# Patient Record
Sex: Female | Born: 1960
Health system: Southern US, Community
[De-identification: ages and names within clinical notes are randomized; demographics above are authoritative.]

## PROBLEM LIST (undated history)

## (undated) DIAGNOSIS — S92502A Displaced unspecified fracture of left lesser toe(s), initial encounter for closed fracture: Secondary | ICD-10-CM

## (undated) DIAGNOSIS — Z720 Tobacco use: Secondary | ICD-10-CM

## (undated) DIAGNOSIS — L409 Psoriasis, unspecified: Secondary | ICD-10-CM

## (undated) DIAGNOSIS — R7303 Prediabetes: Secondary | ICD-10-CM

## (undated) DIAGNOSIS — H5440 Blindness, one eye, unspecified eye: Secondary | ICD-10-CM

## (undated) DIAGNOSIS — I252 Old myocardial infarction: Secondary | ICD-10-CM

## (undated) DIAGNOSIS — I2510 Atherosclerotic heart disease of native coronary artery without angina pectoris: Secondary | ICD-10-CM

## (undated) DIAGNOSIS — F419 Anxiety disorder, unspecified: Secondary | ICD-10-CM

## (undated) DIAGNOSIS — E785 Hyperlipidemia, unspecified: Secondary | ICD-10-CM

## (undated) DIAGNOSIS — J189 Pneumonia, unspecified organism: Secondary | ICD-10-CM

## (undated) DIAGNOSIS — Q211 Atrial septal defect: Secondary | ICD-10-CM

## (undated) DIAGNOSIS — F329 Major depressive disorder, single episode, unspecified: Secondary | ICD-10-CM

## (undated) DIAGNOSIS — M1990 Unspecified osteoarthritis, unspecified site: Secondary | ICD-10-CM

## (undated) DIAGNOSIS — I63511 Cerebral infarction due to unspecified occlusion or stenosis of right middle cerebral artery: Secondary | ICD-10-CM

## (undated) DIAGNOSIS — G8929 Other chronic pain: Secondary | ICD-10-CM

## (undated) DIAGNOSIS — F1910 Other psychoactive substance abuse, uncomplicated: Secondary | ICD-10-CM

## (undated) DIAGNOSIS — R32 Unspecified urinary incontinence: Secondary | ICD-10-CM

## (undated) DIAGNOSIS — I10 Essential (primary) hypertension: Secondary | ICD-10-CM

## (undated) DIAGNOSIS — S92919A Unspecified fracture of unspecified toe(s), initial encounter for closed fracture: Secondary | ICD-10-CM

## (undated) HISTORY — DX: Prediabetes: R73.03

## (undated) HISTORY — DX: Major depressive disorder, single episode, unspecified: F32.9

## (undated) HISTORY — DX: Cerebral infarction due to unspecified occlusion or stenosis of right middle cerebral artery: I63.511

## (undated) HISTORY — PX: CARDIAC CATHETERIZATION: SHX172

## (undated) HISTORY — DX: Displaced unspecified fracture of left lesser toe(s), initial encounter for closed fracture: S92.502A

## (undated) HISTORY — PX: EYE SURGERY: SHX253

## (undated) HISTORY — DX: Unspecified fracture of unspecified toe(s), initial encounter for closed fracture: S92.919A

## (undated) HISTORY — DX: Atherosclerotic heart disease of native coronary artery without angina pectoris: I25.10

## (undated) HISTORY — DX: Other psychoactive substance abuse, uncomplicated: F19.10

## (undated) HISTORY — DX: Essential (primary) hypertension: I10

## (undated) HISTORY — PX: BIOPSY THYROID: PRO38

## (undated) HISTORY — DX: Unspecified urinary incontinence: R32

## (undated) HISTORY — DX: Hyperlipidemia, unspecified: E78.5

## (undated) HISTORY — DX: Psoriasis, unspecified: L40.9

## (undated) HISTORY — DX: Atrial septal defect: Q21.1

## (undated) HISTORY — PX: OTHER SURGICAL HISTORY: SHX169

## (undated) HISTORY — DX: Unspecified osteoarthritis, unspecified site: M19.90

## (undated) HISTORY — DX: Anxiety disorder, unspecified: F41.9

## (undated) HISTORY — DX: Tobacco use: Z72.0

---

## 1999-09-16 ENCOUNTER — Other Ambulatory Visit: Admission: RE | Admit: 1999-09-16 | Discharge: 1999-09-16 | Payer: Self-pay | Admitting: *Deleted

## 2004-06-20 DIAGNOSIS — I63511 Cerebral infarction due to unspecified occlusion or stenosis of right middle cerebral artery: Secondary | ICD-10-CM

## 2004-06-20 HISTORY — DX: Cerebral infarction due to unspecified occlusion or stenosis of right middle cerebral artery: I63.511

## 2005-06-20 DIAGNOSIS — I2510 Atherosclerotic heart disease of native coronary artery without angina pectoris: Secondary | ICD-10-CM

## 2005-06-20 HISTORY — DX: Atherosclerotic heart disease of native coronary artery without angina pectoris: I25.10

## 2006-06-20 DIAGNOSIS — I252 Old myocardial infarction: Secondary | ICD-10-CM

## 2006-06-20 HISTORY — DX: Old myocardial infarction: I25.2

## 2006-10-15 ENCOUNTER — Inpatient Hospital Stay: Payer: Self-pay | Admitting: Cardiovascular Disease

## 2006-10-15 ENCOUNTER — Other Ambulatory Visit: Payer: Self-pay

## 2006-10-16 ENCOUNTER — Other Ambulatory Visit: Payer: Self-pay

## 2006-10-17 ENCOUNTER — Other Ambulatory Visit: Payer: Self-pay

## 2006-11-25 ENCOUNTER — Emergency Department: Payer: Self-pay | Admitting: Emergency Medicine

## 2006-11-25 ENCOUNTER — Other Ambulatory Visit: Payer: Self-pay

## 2007-11-23 ENCOUNTER — Ambulatory Visit: Payer: Self-pay | Admitting: Family Medicine

## 2007-12-14 ENCOUNTER — Emergency Department (HOSPITAL_COMMUNITY): Admission: EM | Admit: 2007-12-14 | Discharge: 2007-12-14 | Payer: Self-pay | Admitting: Emergency Medicine

## 2007-12-26 ENCOUNTER — Ambulatory Visit: Payer: Self-pay | Admitting: Internal Medicine

## 2008-05-16 ENCOUNTER — Emergency Department (HOSPITAL_COMMUNITY): Admission: EM | Admit: 2008-05-16 | Discharge: 2008-05-16 | Payer: Self-pay | Admitting: Emergency Medicine

## 2008-05-26 ENCOUNTER — Emergency Department (HOSPITAL_COMMUNITY): Admission: EM | Admit: 2008-05-26 | Discharge: 2008-05-26 | Payer: Self-pay | Admitting: Emergency Medicine

## 2008-05-27 ENCOUNTER — Emergency Department (HOSPITAL_COMMUNITY): Admission: EM | Admit: 2008-05-27 | Discharge: 2008-05-27 | Payer: Self-pay | Admitting: Emergency Medicine

## 2010-12-14 ENCOUNTER — Emergency Department (INDEPENDENT_AMBULATORY_CARE_PROVIDER_SITE_OTHER): Payer: Medicaid Other

## 2010-12-14 ENCOUNTER — Emergency Department (HOSPITAL_BASED_OUTPATIENT_CLINIC_OR_DEPARTMENT_OTHER)
Admission: EM | Admit: 2010-12-14 | Discharge: 2010-12-15 | Disposition: A | Discharge status: Home / Self Care | Payer: Medicaid Other | Attending: Emergency Medicine | Admitting: Emergency Medicine

## 2010-12-14 DIAGNOSIS — I252 Old myocardial infarction: Secondary | ICD-10-CM | POA: Insufficient documentation

## 2010-12-14 DIAGNOSIS — E785 Hyperlipidemia, unspecified: Secondary | ICD-10-CM | POA: Insufficient documentation

## 2010-12-14 DIAGNOSIS — R55 Syncope and collapse: Secondary | ICD-10-CM | POA: Insufficient documentation

## 2010-12-14 DIAGNOSIS — F341 Dysthymic disorder: Secondary | ICD-10-CM | POA: Insufficient documentation

## 2010-12-14 DIAGNOSIS — I10 Essential (primary) hypertension: Secondary | ICD-10-CM | POA: Insufficient documentation

## 2010-12-14 DIAGNOSIS — M79609 Pain in unspecified limb: Secondary | ICD-10-CM | POA: Insufficient documentation

## 2010-12-14 DIAGNOSIS — Z79899 Other long term (current) drug therapy: Secondary | ICD-10-CM | POA: Insufficient documentation

## 2010-12-14 LAB — URINALYSIS, ROUTINE W REFLEX MICROSCOPIC
Ketones, ur: NEGATIVE mg/dL
Nitrite: NEGATIVE
Protein, ur: NEGATIVE mg/dL

## 2010-12-14 LAB — URINE MICROSCOPIC-ADD ON

## 2010-12-15 LAB — DIFFERENTIAL
Basophils Absolute: 0 10*3/uL (ref 0.0–0.1)
Eosinophils Relative: 4 % (ref 0–5)
Lymphs Abs: 3.7 10*3/uL (ref 0.7–4.0)
Neutro Abs: 4.6 10*3/uL (ref 1.7–7.7)
Neutrophils Relative %: 50 % (ref 43–77)

## 2010-12-15 LAB — CBC
HCT: 39.3 % (ref 36.0–46.0)
MCH: 30 pg (ref 26.0–34.0)
Platelets: 177 10*3/uL (ref 150–400)
RBC: 4.56 MIL/uL (ref 3.87–5.11)
RDW: 12.8 % (ref 11.5–15.5)
WBC: 9.1 10*3/uL (ref 4.0–10.5)

## 2010-12-15 LAB — RAPID URINE DRUG SCREEN, HOSP PERFORMED
Benzodiazepines: NOT DETECTED
Cocaine: NOT DETECTED
Opiates: NOT DETECTED

## 2010-12-15 LAB — BASIC METABOLIC PANEL
Calcium: 9.4 mg/dL (ref 8.4–10.5)
Chloride: 104 mEq/L (ref 96–112)
GFR calc Af Amer: >60 mL/min (ref >60)
GFR calc non Af Amer: >60 mL/min (ref >60)

## 2010-12-15 LAB — CK TOTAL AND CKMB (NOT AT ARMC)
CK, MB: 3 ng/mL (ref 0.3–4.0)
Total CK: 144 U/L (ref 7–177)

## 2010-12-16 LAB — URINE CULTURE
Colony Count: NO GROWTH
Culture  Setup Time: 201206270550
Culture: NO GROWTH

## 2011-01-19 DIAGNOSIS — Q2112 Patent foramen ovale: Secondary | ICD-10-CM

## 2011-01-19 DIAGNOSIS — S92919A Unspecified fracture of unspecified toe(s), initial encounter for closed fracture: Secondary | ICD-10-CM

## 2011-01-19 DIAGNOSIS — Q211 Atrial septal defect: Secondary | ICD-10-CM

## 2011-01-19 HISTORY — DX: Atrial septal defect: Q21.1

## 2011-01-19 HISTORY — DX: Unspecified fracture of unspecified toe(s), initial encounter for closed fracture: S92.919A

## 2011-01-19 HISTORY — DX: Patent foramen ovale: Q21.12

## 2011-01-27 ENCOUNTER — Emergency Department (HOSPITAL_COMMUNITY): Payer: Medicaid Other

## 2011-01-27 ENCOUNTER — Encounter: Payer: Self-pay | Admitting: Internal Medicine

## 2011-01-27 ENCOUNTER — Inpatient Hospital Stay (HOSPITAL_COMMUNITY): Payer: Medicaid Other

## 2011-01-27 ENCOUNTER — Inpatient Hospital Stay (HOSPITAL_COMMUNITY)
Admission: EM | Admit: 2011-01-27 | Discharge: 2011-02-04 | DRG: 065 | Disposition: A | Discharge status: Skilled Nursing Facility | Payer: Medicaid Other | Attending: Internal Medicine | Admitting: Internal Medicine

## 2011-01-27 DIAGNOSIS — R319 Hematuria, unspecified: Secondary | ICD-10-CM | POA: Diagnosis not present

## 2011-01-27 DIAGNOSIS — E785 Hyperlipidemia, unspecified: Secondary | ICD-10-CM | POA: Diagnosis present

## 2011-01-27 DIAGNOSIS — I253 Aneurysm of heart: Secondary | ICD-10-CM | POA: Diagnosis present

## 2011-01-27 DIAGNOSIS — K5900 Constipation, unspecified: Secondary | ICD-10-CM | POA: Diagnosis not present

## 2011-01-27 DIAGNOSIS — Y92009 Unspecified place in unspecified non-institutional (private) residence as the place of occurrence of the external cause: Secondary | ICD-10-CM

## 2011-01-27 DIAGNOSIS — I10 Essential (primary) hypertension: Secondary | ICD-10-CM | POA: Diagnosis present

## 2011-01-27 DIAGNOSIS — I6350 Cerebral infarction due to unspecified occlusion or stenosis of unspecified cerebral artery: Secondary | ICD-10-CM

## 2011-01-27 DIAGNOSIS — F3289 Other specified depressive episodes: Secondary | ICD-10-CM | POA: Diagnosis present

## 2011-01-27 DIAGNOSIS — D649 Anemia, unspecified: Secondary | ICD-10-CM | POA: Diagnosis present

## 2011-01-27 DIAGNOSIS — E876 Hypokalemia: Secondary | ICD-10-CM | POA: Diagnosis present

## 2011-01-27 DIAGNOSIS — F329 Major depressive disorder, single episode, unspecified: Secondary | ICD-10-CM | POA: Diagnosis present

## 2011-01-27 DIAGNOSIS — Z8673 Personal history of transient ischemic attack (TIA), and cerebral infarction without residual deficits: Secondary | ICD-10-CM | POA: Insufficient documentation

## 2011-01-27 DIAGNOSIS — W1789XA Other fall from one level to another, initial encounter: Secondary | ICD-10-CM | POA: Diagnosis present

## 2011-01-27 DIAGNOSIS — R7309 Other abnormal glucose: Secondary | ICD-10-CM | POA: Diagnosis present

## 2011-01-27 DIAGNOSIS — S92919A Unspecified fracture of unspecified toe(s), initial encounter for closed fracture: Secondary | ICD-10-CM | POA: Diagnosis present

## 2011-01-27 DIAGNOSIS — I059 Rheumatic mitral valve disease, unspecified: Secondary | ICD-10-CM | POA: Diagnosis present

## 2011-01-27 DIAGNOSIS — Y998 Other external cause status: Secondary | ICD-10-CM

## 2011-01-27 DIAGNOSIS — I252 Old myocardial infarction: Secondary | ICD-10-CM

## 2011-01-27 DIAGNOSIS — R29810 Facial weakness: Secondary | ICD-10-CM | POA: Diagnosis present

## 2011-01-27 DIAGNOSIS — F411 Generalized anxiety disorder: Secondary | ICD-10-CM | POA: Diagnosis present

## 2011-01-27 DIAGNOSIS — Z7982 Long term (current) use of aspirin: Secondary | ICD-10-CM

## 2011-01-27 DIAGNOSIS — L408 Other psoriasis: Secondary | ICD-10-CM | POA: Diagnosis present

## 2011-01-27 DIAGNOSIS — Y93E1 Activity, personal bathing and showering: Secondary | ICD-10-CM

## 2011-01-27 DIAGNOSIS — Q211 Atrial septal defect: Secondary | ICD-10-CM

## 2011-01-27 DIAGNOSIS — Q2111 Secundum atrial septal defect: Secondary | ICD-10-CM

## 2011-01-27 DIAGNOSIS — F17200 Nicotine dependence, unspecified, uncomplicated: Secondary | ICD-10-CM | POA: Diagnosis present

## 2011-01-27 LAB — DIFFERENTIAL
Basophils Absolute: 0 10*3/uL (ref 0.0–0.1)
Lymphocytes Relative: 20 % (ref 12–46)
Lymphs Abs: 1.8 10*3/uL (ref 0.7–4.0)
Monocytes Absolute: 0.5 10*3/uL (ref 0.1–1.0)
Monocytes Relative: 5 % (ref 3–12)
Neutro Abs: 6.5 10*3/uL (ref 1.7–7.7)
Neutrophils Relative %: 74 % (ref 43–77)

## 2011-01-27 LAB — BASIC METABOLIC PANEL
BUN: 12 mg/dL (ref 6–23)
CO2: 27 mEq/L (ref 19–32)
CO2: 26 mEq/L (ref 19–32)
Calcium: 9 mg/dL (ref 8.4–10.5)
Calcium: 8.9 mg/dL (ref 8.4–10.5)
Creatinine, Ser: 0.57 mg/dL (ref 0.50–1.10)
Creatinine, Ser: 0.51 mg/dL (ref 0.50–1.10)
GFR calc Af Amer: >60 mL/min (ref >60)
GFR calc non Af Amer: >60 mL/min (ref >60)
Sodium: 141 mEq/L (ref 135–145)

## 2011-01-27 LAB — COMPREHENSIVE METABOLIC PANEL
ALT: 21 U/L (ref 0–35)
AST: 28 U/L (ref 0–37)
Albumin: 3.5 g/dL (ref 3.5–5.2)
Alkaline Phosphatase: 78 U/L (ref 39–117)
Chloride: 105 mEq/L (ref 96–112)
Potassium: 2.5 mEq/L — CL (ref 3.5–5.1)
Sodium: 143 mEq/L (ref 135–145)
Total Bilirubin: 0.5 mg/dL (ref 0.3–1.2)
Total Protein: 6.4 g/dL (ref 6.0–8.3)

## 2011-01-27 LAB — PROTIME-INR
INR: 1.03 (ref 0.00–1.49)
Prothrombin Time: 13.7 seconds (ref 11.6–15.2)

## 2011-01-27 LAB — CBC
HCT: 31.7 % — ABNORMAL LOW (ref 36.0–46.0)
MCH: 30.5 pg (ref 26.0–34.0)
MCV: 86.4 fL (ref 78.0–100.0)
WBC: 8.9 10*3/uL (ref 4.0–10.5)

## 2011-01-27 LAB — HEMOGLOBIN A1C
Hgb A1c MFr Bld: 6.4 % — ABNORMAL HIGH (ref <5.7)
Mean Plasma Glucose: 137 mg/dL — ABNORMAL HIGH (ref <117)

## 2011-01-27 LAB — LIPID PANEL
Cholesterol: 166 mg/dL (ref 0–200)
LDL Cholesterol: 86 mg/dL (ref 0–99)
Total CHOL/HDL Ratio: 6.1 RATIO
VLDL: 53 mg/dL — ABNORMAL HIGH (ref 0–40)

## 2011-01-27 NOTE — H&P (Signed)
Hospital Admission Note Date: 01/27/2011  Patient name:  Sandra [name]  Medical record number:  [id number] Date of birth:  1960/12/20  Age: 50 y.o. Gender:  female PCP:    No primary provider on file.  Medical Service:   Internal Medicine Teaching Service   Attending physician:  Dr. Meredith Pel First Contact:   Dr. Berlinda Last  Pager: [phone] Second Contact:   Dr. Gilford Rile   Pager: [phone] After Hours:    First Contact   Pager: [phone]      Second Contact  Pager: [phone]   Chief Complaint: left sided numbness  History of Present Illness: Patient is a 50 y.o. female with a PMHx of hypertension, coronary artery disease, hyperlipidemia, current smoker and family history of premature vascular disease comes to the emergency room with chief complaint of left-sided numbness and fall. Patient complained that she has been having numbness in her left arm and left lower leg since last 3 weeks, the numbness is described as persistent and non-progressive. Patient was seen by her primary care physician about 4 days ago and was prescribed Mobic, tramadol and Norco for pain control. She was also advised bedrest. She said that it was thought to be secondary to nerve impingement. He also had pain in her right shoulder joint at that time. The patient was able to do her activities of daily living without much difficulty until 2 days ago when her balance progressively got worse and she had a fall while taking shower on night prior to admission. Patient also complained of difficulty in swallowing since last 2 days. Patient denies any chest pain, shortness of breath, difficulty breathing, orthopnea, PND or swelling in her extremities. There is no recent changes in the medications. Patient denies any palpitations recently. She denies any changes in bowel or bladder habits.    Current Outpatient Medications: Klonopin 1 mg by mouth twice a day Lisinopril 40 mg by mouth daily Metoprolol 25 mg twice a day Pravastatin 40 mg  daily Norco, Mobic, and tramadol-dose unknown  Allergies: Azor makes her blood pressure fall acutely.  Past Medical History: Hypertension Anxiety and depression Hyperlipidemia Myocardial infarction in 2007 with stent placement by Dr. Park Breed at [facility] in [place] Endometriosis History of a heart murmur Psoriasis  Past Surgical History: Dilation and curettage-remote  Family History: mom died due to heart attack at age 26 Dad died of a stroke at 72 brother has left-sided hemiplegia from a stroke which he had at age 33 and is wheelchair-bound  Social History: Divorced, has one son and works at [employer]. Patient has no insurance. Patient smokes about one pack per day and has a 40-pack-year smoking history. Denies alcohol or illicit drug use.  Review of Systems: Pertinent items are noted in HPI.  Vital Signs: T:  98.2 P:  91  BP:  154/78  RR:  20  O2 sat:  97% on room air    Physical Exam: General: Vital signs reviewed and noted. Well-developed, well-nourished, in no acute distress; alert, appropriate and cooperative throughout examination.  Head: Normocephalic, atraumatic.  Eyes: PERRL, EOMI, No signs of anemia or jaundince.  Ears: TM nonerythematous, not bulging, good light reflex bilaterally.  Nose: Mucous membranes moist, not inflammed, nonerythematous.  Throat: Oropharynx nonerythematous, no exudate appreciated.   Neck: No deformities, masses, or tenderness noted.Supple, No carotid Bruits, no JVD.  Lungs:  Normal respiratory effort. Clear to auscultation BL without crackles or wheezes.  Heart: RRR. S1 and S2 normal without gallop, or rubs.  Patient has 3/6 systolic murmur best heard at left sternal border   Abdomen:  BS normoactive. Soft, Nondistended, non-tender.  No masses or organomegaly.  Extremities: No pretibial edema.  Neurologic: A&O X3, CN II - XII are grossly intact. Motor strength is 5/5 in the right upper and lower extremities, motor 2/5 left upper  extremity and 4/5 in left lower extremity, Sensations intact to light touch, Cerebellar signs negative. Diminished reflexes in the left upper extremity.Babinski's downgoing bilaterally   Skin:  psoariatic rashes right elbow noted.   Lab results: CBC:    Component Value Date/Time   WBC 8.9 01/27/2011 0625   HGB 11.2* 01/27/2011 0625   HCT 31.7* 01/27/2011 0625   PLT 152 01/27/2011 0625   MCV 86.4 01/27/2011 0625   NEUTROABS 6.5 01/27/2011 0625   LYMPHSABS 1.8 01/27/2011 0625   MONOABS 0.5 01/27/2011 0625   EOSABS 0.1 01/27/2011 0625   BASOSABS 0.0 01/27/2011 0625      Comprehensive Metabolic Panel:    Component Value Date/Time   NA 142 01/27/2011 0625   K 2.5* 01/27/2011 0625   CL 104 01/27/2011 0625   CO2 27 01/27/2011 0625   BUN 12 01/27/2011 0625   CREATININE 0.57 01/27/2011 0625   GLUCOSE 117* 01/27/2011 0625   CALCIUM 9.0 01/27/2011 0625     Lab Results  Component Value Date   CKTOTAL 144 12/14/2010   CKMB 3.0 12/14/2010   TROPONINI <0.30 12/14/2010      Imaging results:  CT head: IMPRESSION:   1.  Interval right insular/superior parietal low density favors an   acute ischemic infarction.  No associated acute hemorrhage or   significant mass effect.   2.  Interval clearing paranasal sinuses since 05/27/2008.   3.  Otherwise, negative.   Assessment & Plan:  #1 acute right-sided nonhemorrhagic stroke: Neurology was consulted by the ED physicians who had asked for internal medicine admission. Patient is out of the acute window for thrombolytics. We will work on risk stratification for secondary prevention. Smoking cessation, lipid control, HbA1c, better control of blood pressure and changing aspirin to Aggrenox. I would also obtain carotid Dopplers and 2-D echo to rule out atherosclerotic and embolic stroke respectively. I admitted the patient to telemetry unit and asked to do Q4H neuro checks for acute progression. Start Crestor for plaque stabilization. Patient had already passed the bedside  swallow screen by the time I saw her and I will put her on heart healthy diet. MRI/MRA ordered as per ED physician on the instructions of neurologist. #2 hypokalemia: Potassium was repleted in the emergency department along with magnesium. Repeat potassium check tomorrow morning. #3 coronary artery disease: Patient has had history of MI in 2007. No active chest pain or palpitations at this time. We'll continue beta blockers and antiplatelet therapy with Aggrenox.  #4 hypertension: Slightly hypertensive at this time and I would restart home blood pressure medications ie lisinopril and beta blocker. #5 anxiety and depression: Continue home medication Klonopin at this time. #6 smoking: Social worker consult for smoking cessation counseling. Patient is very motivated to stop smoking at this time.    DVT PPX: Lovenox 40 mg subcutaneous     (PGY1):  ____________________________________    Date/ Time:      ____________________________________     Lars Mage, M.D. (Senior resident):    ____________________________________    Date/ Time:      ____________________________________     I have seen and examined the patient. I reviewed the resident/fellow note  and agree with the findings and plan of care as documented. My additions and revisions are included.   Signature:  ____________________________________________     Internal Medicine Teaching Service Attending    Date:    ____________________________________________

## 2011-01-28 ENCOUNTER — Inpatient Hospital Stay (HOSPITAL_COMMUNITY): Payer: Medicaid Other

## 2011-01-28 DIAGNOSIS — S92502A Displaced unspecified fracture of left lesser toe(s), initial encounter for closed fracture: Secondary | ICD-10-CM

## 2011-01-28 HISTORY — DX: Displaced unspecified fracture of left lesser toe(s), initial encounter for closed fracture: S92.502A

## 2011-01-28 LAB — URINALYSIS, ROUTINE W REFLEX MICROSCOPIC
Glucose, UA: NEGATIVE mg/dL
Ketones, ur: 15 mg/dL — AB
Protein, ur: NEGATIVE mg/dL
Urobilinogen, UA: 0.2 mg/dL (ref 0.0–1.0)

## 2011-01-28 LAB — BASIC METABOLIC PANEL
BUN: 7 mg/dL (ref 6–23)
Chloride: 110 mEq/L (ref 96–112)
Glucose, Bld: 112 mg/dL — ABNORMAL HIGH (ref 70–99)
Potassium: 3.5 mEq/L (ref 3.5–5.1)

## 2011-01-28 LAB — IRON AND TIBC
Iron: 38 ug/dL — ABNORMAL LOW (ref 42–135)
Saturation Ratios: 13 % — ABNORMAL LOW (ref 20–55)
TIBC: 287 ug/dL (ref 250–470)
UIBC: 249 ug/dL

## 2011-01-28 LAB — CBC
HCT: 31.8 % — ABNORMAL LOW (ref 36.0–46.0)
Hemoglobin: 11 g/dL — ABNORMAL LOW (ref 12.0–15.0)
WBC: 6.6 10*3/uL (ref 4.0–10.5)

## 2011-01-28 LAB — DIFFERENTIAL
Basophils Absolute: 0 10*3/uL (ref 0.0–0.1)
Lymphocytes Relative: 35 % (ref 12–46)
Lymphs Abs: 2.3 10*3/uL (ref 0.7–4.0)
Monocytes Absolute: 0.4 10*3/uL (ref 0.1–1.0)
Neutro Abs: 3.7 10*3/uL (ref 1.7–7.7)

## 2011-01-28 LAB — URINE MICROSCOPIC-ADD ON

## 2011-01-28 LAB — SEDIMENTATION RATE: Sed Rate: 26 mm/hr — ABNORMAL HIGH (ref 0–22)

## 2011-01-28 NOTE — Consult Note (Signed)
NAMEJALENA, Sandra [name]                 ACCOUNT NO.:  [id number]  MEDICAL RECORD NO.:  [id number]  LOCATION:  3004                         FACILITY:  [facility]  PHYSICIAN:  Levie Heritage, MD       DATE OF BIRTH:  03-13-1961  DATE OF CONSULTATION:  01/27/2011 DATE OF DISCHARGE:                                CONSULTATION   REFERRING PHYSICIAN:  ER Team.  REASON FOR CONSULTATION:  Stroke.  CHIEF COMPLAINT:  Left-sided weakness.  HISTORY OF PRESENT ILLNESS:  This patient is a 49 year old woman who has been complaining of left-sided weakness mainly involving the left arm and left side of the face more than the left leg for almost 3 weeks now. As per the patient, she has been still able to move the arm and the leg to some extent in the last 3 weeks, but yesterday she noted that it got more weaker as compared to her weakness for 3 weeks.  She also noted that this tingling and numbness of that side as well which lasted transiently, but the weakness has persisted.  She denies any changes in her vision.  She states that she is still able to walk, but kind of drag her left leg.  She came to the emergency department this morning and had a CT scan of the head which is showing low-density area in the right MCA distribution.  PAST MEDICAL HISTORY:  Hypertension, psoriasis, endometriosis, coronary artery disease status post myocardial infarction in 2008 with status post stenting, hyperlipidemia.  She had one-time miscarriage as well of her second pregnancy.  CURRENT LIST OF MEDICATION:  She does not know the dosages, but she takes pravastatin, lisinopril, metoprolol, Klonopin, and aspirin 325 mg daily.  SOCIAL HISTORY:  She works at [employer].  She is divorced.  She has a son. She smokes almost a pack of cigarettes a day since teenage.  Denies use of alcohol or any illicit drugs.  FAMILY HISTORY:  Mother died of heart attack.  Father died of stroke. The patient's brother also had  stroke.  ALLERGIES:  She is allergic to AZOR.  REVIEW OF SYSTEMS:  Denies any chest pain, denies any shortness of breath, denies any problem __________.  Denies any nausea, vomiting, diarrhea.  No issues with the bowel or bladder control function.  No recent fevers.  No recent rashes.  No recent weight loss.  No joint problems.  No jaundice.  No dark stools.  Rest of 10-organ review of systems unremarkable.  REVIEW OF CLINICAL DATA:  I have seen her CT scan of the head and have noted the low-density area of subacute severity in the right MCA distribution. I have reviewed her labs and noted mild anemia on the CBC with low potassium on the BMP and increased glucose values, normal creatinine. Rest of the labs are pending.  PHYSICAL EXAMINATION:  GENERAL/VITAL SIGNS:  Currently, lying on bed comfortably with vital, 155/78 mmHg and pulse of 88 per minute.  Her NIH stroke scale is 1 for the left face weakness, 2 for the left arm and 1 for the left leg weakness making total of 5.  She is awake and oriented x3 in no  acute distress.  Bilateral pupils reactive to light and accommodation.  Moves eyes to all direction.  There is no field cut noted on limited bedside evaluation.  The face is obviously asymmetrical with weakness of the left lower face of upper motor neuron type. Sensations are preserved bilaterally however.  Tongue is midline without atrophy or fasciculation.  Palate elevates symmetrically bilaterally. MUSCULOSKELETAL:  Motor examination reveals that although she can raise the left arm with the proximal shoulder elevation, however, the arm is weak mostly in 3-4/5 ranges and the left leg is also 4/5 ranges weakness.  The right side is intact strength wise.  Sensory examination reveals mostly intact to light touch all over symmetrically. The gait was deferred.  IMPRESSION:  A 50 year old woman with 3 weeks history of left-sided weakness mainly involving the left arm and face with  some sensory involvement as well.  Left leg is also weaker than right normal side. As the CT scan of the head shows low-density area, her symptoms do correlate with the right middle cerebral artery infarction that could have been the result of terminal branch occlusions of the middle cerebral artery versus artery to artery emboli within the right middle cerebral artery.  PLAN: 1. I have suggested the primary team to get the MRI and MRA of the     patient's brain. 2. Please start her on Plavix 75 mg daily as she has been on aspirin     already and given the cardiac stent, likley she should be on both. 3. In addition to the fasting lipid panel and HbA1c checks, she also     needs to have the hypercoagulable workup given the stroke in early     age, multiple family members with strokes, as well as history of miscarriage. 4. Please get her cardiac echo and Doppler carotid as well. 5. TPA was not offered to this patient as the symptoms are going on     for at least 3 weeks now and she is out of the TPA window period. 6. Stroke team will follow up the patient from morning and we will     proceed according to the case progresses.    ______________________________ Levie Heritage, MD     WS/MEDQ  D:  01/27/2011  T:  01/27/2011  Job:  [id number]  Electronically Signed by Levie Heritage MD on 01/28/2011 07:50:54 AM

## 2011-01-29 DIAGNOSIS — I6350 Cerebral infarction due to unspecified occlusion or stenosis of unspecified cerebral artery: Secondary | ICD-10-CM

## 2011-01-29 LAB — BASIC METABOLIC PANEL
BUN: 9 mg/dL (ref 6–23)
CO2: 28 mEq/L (ref 19–32)
Calcium: 8.9 mg/dL (ref 8.4–10.5)
Creatinine, Ser: 0.56 mg/dL (ref 0.50–1.10)
Glucose, Bld: 127 mg/dL — ABNORMAL HIGH (ref 70–99)

## 2011-01-30 LAB — BASIC METABOLIC PANEL
BUN: 11 mg/dL (ref 6–23)
CO2: 25 mEq/L (ref 19–32)
Calcium: 9 mg/dL (ref 8.4–10.5)
Creatinine, Ser: 0.52 mg/dL (ref 0.50–1.10)
GFR calc non Af Amer: >60 mL/min (ref >60)
Glucose, Bld: 108 mg/dL — ABNORMAL HIGH (ref 70–99)

## 2011-01-30 LAB — GLUCOSE, CAPILLARY

## 2011-01-31 DIAGNOSIS — I6350 Cerebral infarction due to unspecified occlusion or stenosis of unspecified cerebral artery: Secondary | ICD-10-CM

## 2011-01-31 DIAGNOSIS — I6330 Cerebral infarction due to thrombosis of unspecified cerebral artery: Secondary | ICD-10-CM

## 2011-01-31 LAB — ANA: Anti Nuclear Antibody(ANA): NEGATIVE

## 2011-01-31 LAB — BASIC METABOLIC PANEL
Chloride: 107 mEq/L (ref 96–112)
Creatinine, Ser: 0.53 mg/dL (ref 0.50–1.10)
GFR calc Af Amer: >60 mL/min (ref >60)
Potassium: 3.4 mEq/L — ABNORMAL LOW (ref 3.5–5.1)
Sodium: 144 mEq/L (ref 135–145)

## 2011-01-31 LAB — CARDIOLIPIN ANTIBODIES, IGG, IGM, IGA: Anticardiolipin IgM: 1 MPL U/mL — ABNORMAL LOW (ref <11)

## 2011-01-31 LAB — LUPUS ANTICOAGULANT PANEL: DRVVT: 44 secs — ABNORMAL HIGH (ref 34.1–42.2)

## 2011-01-31 LAB — URINALYSIS, ROUTINE W REFLEX MICROSCOPIC
Nitrite: NEGATIVE
Specific Gravity, Urine: 1.018 (ref 1.005–1.030)
Urobilinogen, UA: 1 mg/dL (ref 0.0–1.0)

## 2011-01-31 LAB — BETA-2-GLYCOPROTEIN I ABS, IGG/M/A: Beta-2-Glycoprotein I IgA: 2 A Units (ref <20)

## 2011-01-31 LAB — GLUCOSE, CAPILLARY
Glucose-Capillary: 164 mg/dL — ABNORMAL HIGH (ref 70–99)
Glucose-Capillary: 88 mg/dL (ref 70–99)
Glucose-Capillary: 132 mg/dL — ABNORMAL HIGH (ref 70–99)

## 2011-01-31 NOTE — Consult Note (Signed)
Sandra [name], Sandra [name] NO.:  [id number]  MEDICAL RECORD NO.:  [id number]  LOCATION:  3004                         FACILITY:  [facility]  PHYSICIAN:  Wylene Simmer, MD        DATE OF BIRTH:  December 09, 1960  DATE OF CONSULTATION:  01/28/2011 DATE OF DISCHARGE:                                CONSULTATION   REASON FOR CONSULTATION:  Left foot pain.  CONSULTING PROVIDER:  Dr. Kristine Garbe.  HISTORY OF PRESENT ILLNESS:  The patient is a 50 year old female with past medical history significant for stroke and coronary artery disease. She fell in the bathtub Wednesday night just prior to her admission. She does not recall the exact mechanism of injury but says her left fifth toe has been hurting since she fell.  She has never had any injury or surgery to this foot in the past.  She complains of dull aching pain in the foot that is moderate in severity.  It is worse when she stands on it and feels better when she keeps it elevated.  She is not diabetic. She does smoke a pack cigarettes a day.  PAST MEDICAL HISTORY: 1. Stroke. 2. Coronary artery disease.  PAST SURGICAL HISTORY:  Coronary artery stenting.  SOCIAL HISTORY:  The patient smokes a pack of cigarettes a day.  She works at [employer] and is divorced.  FAMILY HISTORY:  Her mother died of a heart attack.  Father died of a stroke.  Her brother also has a history of stroke.  REVIEW OF SYSTEMS:  No recent fever, chills, nausea, vomiting, or shortness of breath.  Review of systems as above and otherwise negative.  PHYSICAL EXAMINATION:  GENERAL:  The patient is an obese female in no apparent stress.  She is alert and oriented x4.  Mood and affect are normal. HEENT:  Extraocular motions are intact. LUNGS:  Respirations are unlabored. EXTREMITIES:  Her left foot has some swelling distally.  There is no ecchymosis.  The skin is healthy and intact.  There is no lymphadenopathy.  Pulses are palpable at the dorsalis pedis  and posterior tibial arteries.  She feels light touch normally throughout the left foot.  She has 5/5 strength in plantarflexion and dorsiflexion of her left ankle and toes.  X-RAYS:  AP and lateral views of the left foot show a nondisplaced fracture of the head of the proximal phalanx of the left fifth toe.  ASSESSMENT:  Left fifth toe proximal phalanx fracture.  PLAN:  I explained the nature of the injury to the patient in detail. At this point, I recommend buddy taping the fifth toe to the fourth toe. She will also benefit from ambulating in a hard sole shoe.  She can go without this when in bed but should wear when attempting to ambulate for pain control.  She needs to follow up with me in a month for repeat set of x-rays and otherwise she is weightbearing as tolerated on her left lower extremity.  She understands this plan and agrees.     Wylene Simmer, MD     JH/MEDQ  D:  01/28/2011  T:  01/29/2011  Job:  [id number]  Electronically  Signed by Jenny Reichmann Kina Shiffman  on 01/31/2011 09:20:03 AM

## 2011-02-01 DIAGNOSIS — M79609 Pain in unspecified limb: Secondary | ICD-10-CM

## 2011-02-01 LAB — GLUCOSE, CAPILLARY: Glucose-Capillary: 117 mg/dL — ABNORMAL HIGH (ref 70–99)

## 2011-02-01 LAB — BASIC METABOLIC PANEL
BUN: 10 mg/dL (ref 6–23)
Calcium: 9.1 mg/dL (ref 8.4–10.5)
Creatinine, Ser: 0.48 mg/dL — ABNORMAL LOW (ref 0.50–1.10)
GFR calc non Af Amer: >60 mL/min (ref >60)
Glucose, Bld: 100 mg/dL — ABNORMAL HIGH (ref 70–99)

## 2011-02-02 LAB — GLUCOSE, CAPILLARY
Glucose-Capillary: 223 mg/dL — ABNORMAL HIGH (ref 70–99)
Glucose-Capillary: 148 mg/dL — ABNORMAL HIGH (ref 70–99)

## 2011-02-02 LAB — FACTOR 5 LEIDEN

## 2011-02-03 DIAGNOSIS — I6350 Cerebral infarction due to unspecified occlusion or stenosis of unspecified cerebral artery: Secondary | ICD-10-CM

## 2011-02-03 LAB — GLUCOSE, CAPILLARY: Glucose-Capillary: 96 mg/dL (ref 70–99)

## 2011-02-04 LAB — GLUCOSE, CAPILLARY
Glucose-Capillary: 98 mg/dL (ref 70–99)
Glucose-Capillary: 76 mg/dL (ref 70–99)

## 2011-02-07 LAB — GLUCOSE, CAPILLARY: Glucose-Capillary: 156 mg/dL — ABNORMAL HIGH (ref 70–99)

## 2011-02-08 LAB — PROTEIN C, TOTAL: Protein C, Total: 121 % (ref 72–160)

## 2011-02-15 NOTE — Discharge Summary (Signed)
  [name], [name] NO.:  [id number]  MEDICAL RECORD NO.:  [id number]  LOCATION:  3004                         FACILITY:  [facility]  PHYSICIAN:  Ileana Roup, M.D.  DATE OF BIRTH:  [birth year]  DATE OF ADMISSION:  01/27/2011 DATE OF DISCHARGE:  02/04/2011                              DISCHARGE SUMMARY   ADDENDUM  She is discharged to a skilled nursing facility.  Vitals at discharge were as follows:  Temperature 97.4, pulse 48, respirations 16, blood pressure 139/81, saturating 96% on room air.    ______________________________ Vernice Jefferson, MD   ______________________________ Ileana Roup, M.D.    NK/MEDQ  D:  02/04/2011  T:  02/05/2011  Job:  [id number]  Electronically Signed by Vernice Jefferson MD on 02/10/2011 04:32:41 PM Electronically Signed by Margarito Liner M.D. on 02/15/2011 07:12:55 PM

## 2011-02-15 NOTE — Discharge Summary (Signed)
Sandra [name], Sandra [name] NO.:  [id number]  MEDICAL RECORD NO.:  [id number]  LOCATION:  3004                         FACILITY:  [facility]  PHYSICIAN:  Jay Schlichter, M.D.  DATE OF BIRTH:  01-05-1961  DATE OF ADMISSION:  01/27/2011 DATE OF DISCHARGE:  02/04/2011                              DISCHARGE SUMMARY   DISCHARGE DIAGNOSES: 1. Status post right middle cerebral artery stroke with residual left     arm weakness. 2. History of coronary artery disease. 3. Hypertension. 4. Prediabetes with a hemoglobin A1c of 6.4. 5. History of narcotic abuse. 6. Anxiety. 7. Hyperlipidemia. 8. Psoriasis. 9. Depression, not otherwise specified. 10.Intra-articular fracture of the distal aspect proximal phalanx of the fifth digit, nonoperative mangement.  DISCHARGE MEDICATIONS: 1. Baclofen 5 mg p.o. q.8 h. as needed for muscle spasms. 2. Citalopram 20 mg p.o. daily. 3. Clopidogrel 75 mg p.o. daily. 4. Docusate 100 mg p.o. b.i.d. as needed for constipation. 5. Fenofibrate 54 mg p.o. daily. 6. Metformin 500 mg p.o. b.i.d. 7. Nicotine patch 21 mg per patch for 6 weeks, 14 mg per patch for 2     weeks, and 7 mg per patch for 2 weeks, then stopping altogether. 8. Aspirin 325 p.o. daily. 9. Klonopin 1 mg p.o. b.i.d. 10.Lisinopril 40 mg p.o. daily. 11.Metoprolol 25 mg p.o. b.i.d. 12.Pravastatin 40 mg p.o. daily. 13.Tramadol 50 mg p.o. daily.  DISPOSITION AND FOLLOWUP: 1. Dr. Doran Durand with [facility] on [place] on February 16, 2011, at 10:30 a.m.  This will be for     followup of her left pinky toe fracture. 2. Dr. Owens Shark, [facility] on February 24, 2011, at     1:30 p.m.  At this visit, the patient should be assessed for blood     pressure and prediabetes and she should be asked about her toe as     well.  In addition, she should be asked how physical therapy has     been going and recovering from her stroke and to see if she  is     followed up with Dr. Leonie Man of Neurology for enrollment in the study     for closure of her PFO versus medical therapy that he would like to     enroll her in and she has expressed interest in being in. 3. Dr. Leonie Man, Neurology.  The patient has been given information to     call him and set up an appointment.  She is expressed interest in     enrolling in the study that Dr. Leonie Man is involved in that will     examine closure of a patent foramen ovale versus medical therapy in     the setting of stroke.  PROCEDURES PERFORMED: 1. Transesophageal echocardiogram. 2. MRA of the head 3. MR of the head 4. CT of the head.  CONSULTATIONS:  Neurology.  BRIEF ADMITTING HISTORY AND PHYSICAL:  This was a 50 year old woman with history of hypertension, coronary artery disease, and hyperlipidemia who was also current smoker who presented to the emergency room with chief complaint of left-sided numbness and fall.  The patient complained that she had been having numbness in her left arm and left lower leg for the past 3 weeks described as persistent and nonprogressive.  She was seen by her primary care physician 4 days prior to admission and was prescribed Mobic, tramadol, and Norco for pain control.  She was advised bedrest.  She said it was thought to be secondary to nerve impingement. She also had pain in the right shoulder at that time.  She was able to do activities of daily living until roughly 2 days ago when her balance progressively got worsened and she fell while taking a shower.  She complained of difficulty swallowing in the last few days.  The patient denied any chest pain, shortness of breath, difficulty breathing, orthopnea, PND, or swelling in the extremities.  There have been no recent changes in her medications and the patient denies palpitations or change in bowel or bladder habits.  PHYSICAL EXAMINATION ON ADMISSION:  VITAL SIGNS:  Temperature 98.2, pulse of 91, blood  pressure 154/78, respiratory rate 20, and O2 sat 97% room air. GENERAL:  She was a well-developed, well-nourished woman in no acute distress. EYES:  Pupils are equal, round, and reactive to light.  Extraocular motions are intact. LUNGS:  Clear to auscultation bilaterally. HEART:  Regular rate and rhythm.  Normal S1 and S2.  3/6 systolic murmur heard best at the left sternal border. ABDOMEN:  Soft, nontender, and nondistended. NEUROLOGIC:  A and O x3.  Cranial nerves significant for left facial droop and numbness on the left side of the face.  Motor strength was 5/5 in the right upper and lower extremities and 2/5 in the left upper extremity and 4/5 in the left lower extremity.  Sensation was intact to light touch.  Cerebellar signs were negative.  Her Babinski's were downgoing bilaterally. SKIN:  She had psoriatic rashes in the right elbow noted.  LABORATORY DATA:  CBC:  White blood cell count 8.9, hemoglobin 11.2, hematocrit 31.7, and platelets 132.  Sodium 142, potassium 2.5, chloride 104, CO2 of 27, BUN 12, creatinine 0.57, and glucose 117.  Initial troponin less than 0.30.  Head CT showed interval right insular and superior parietal low-density peppering an acute ischemic infarction and MR of the brain along with MRA showed an occluded right middle cerebral artery consistent with acute stroke.  HOSPITAL COURSE BY PROBLEM: 1. Right MCA occlusion:  This patient presented with left-sided     deficits and imaging results consistent with right MCA occlusion.     This patient was out of window for tPA, so was given typical     poststroke care including risk stratification.  However, given the     young age of this patient and her MI at young age, it was felt that     closer attention needed to be paid to possible hypercoagulability     and other sources of thrombosis or embolism, a TEE was performed     that was negative for thrombosis, but was significant for patent     foramen  ovale.  She was seen by Neurology and will be enrolled in a     study of PFO closure versus medical therapy in the setting of     cryptogenic stroke.  Her lower extremity Dopplers were negative.     Her carotid Dopplers were negative for stenosis in the internal     carotids and her lipid panel was within normal limits.  She     received  inpatient physical therapy and was discharged to SNF as     opposed to inpatient rehab because of social issues.  Her     hypercoagulability workup came back initially with no significant     values with a few values still pending. 2. Intra-articular fracture of the distal aspect proximal phalanx of     the fifth digit. The patient reported that she had a fall     in the shower and indeed x-ray confirmed a small nondisplaced     fracture of the left pinky toe.  Orthopedics was consulted and     recommended buddy tape and symptomatic treatment along with hard     sole shoe.  She will follow up for a repeat x-ray with Orthopedics     in roughly 4 weeks with Dr. Doran Durand. 3. Hypertension:  This patient's blood pressures remained in     appropriate range throughout the duration of her hospitalization     and she will be discharged on her normal antihypertensive     medicines. 4. Prediabetes:  This patient had a hemoglobin A1c of 6.4 and it was     recommended that she started on twice daily metformin given her     extensive risk factors for cardiovascular disease. 5. Hypokalemia:  This patient on admission was found to have potassium     of 2.8 and was repleted accordingly.  Following repletion, her     potassium remained in appropriate range throughout the duration of     her admission.  DISCHARGE LABORATORY DATA AND VITAL SIGNS:  Vital signs, prior to discharge, temperature 97.8, blood pressure 166/83, pulse of 63, respirations 20, and O2 sat 99% on room air.  No new labs for this patient.  Again, she was discharged to SNF in stable condition for  continued rehabilitation following her right MCA occlusion and residual left-sided weakness.  She will be followed up with Primary Care, Orthopedics, and Neurology for continued management of this woman with vascular disease at a young age.    ______________________________ Rich Reining, MD   ______________________________ Jay Schlichter, M.D.    BW/MEDQ  D:  02/03/2011  T:  02/03/2011  Job:  [id number]  cc:   Wylene Simmer, MD Pramod P. Leonie Man, MD Dr. Owens Shark  Electronically Signed by Rich Reining MD on 02/07/2011 12:06:45 PM Electronically Signed by Bertha Stakes M.D. on 02/15/2011 07:12:37 PM

## 2011-02-24 ENCOUNTER — Encounter: Payer: Self-pay | Admitting: Internal Medicine

## 2011-03-07 ENCOUNTER — Encounter: Payer: Self-pay | Admitting: Internal Medicine

## 2011-03-07 DIAGNOSIS — I10 Essential (primary) hypertension: Secondary | ICD-10-CM | POA: Insufficient documentation

## 2011-03-07 DIAGNOSIS — I2510 Atherosclerotic heart disease of native coronary artery without angina pectoris: Secondary | ICD-10-CM | POA: Insufficient documentation

## 2011-03-07 DIAGNOSIS — Z720 Tobacco use: Secondary | ICD-10-CM

## 2011-03-07 DIAGNOSIS — L409 Psoriasis, unspecified: Secondary | ICD-10-CM | POA: Insufficient documentation

## 2011-03-07 DIAGNOSIS — Q2112 Patent foramen ovale: Secondary | ICD-10-CM | POA: Insufficient documentation

## 2011-03-07 DIAGNOSIS — E785 Hyperlipidemia, unspecified: Secondary | ICD-10-CM | POA: Insufficient documentation

## 2011-03-07 DIAGNOSIS — R7303 Prediabetes: Secondary | ICD-10-CM

## 2011-03-07 DIAGNOSIS — I63511 Cerebral infarction due to unspecified occlusion or stenosis of right middle cerebral artery: Secondary | ICD-10-CM | POA: Insufficient documentation

## 2011-03-07 DIAGNOSIS — F329 Major depressive disorder, single episode, unspecified: Secondary | ICD-10-CM | POA: Insufficient documentation

## 2011-03-07 DIAGNOSIS — Q211 Atrial septal defect: Secondary | ICD-10-CM | POA: Insufficient documentation

## 2011-03-07 DIAGNOSIS — F32A Depression, unspecified: Secondary | ICD-10-CM

## 2011-03-07 HISTORY — DX: Hyperlipidemia, unspecified: E78.5

## 2011-03-07 HISTORY — DX: Prediabetes: R73.03

## 2011-03-07 HISTORY — DX: Depression, unspecified: F32.A

## 2011-03-07 HISTORY — DX: Psoriasis, unspecified: L40.9

## 2011-03-07 HISTORY — DX: Essential (primary) hypertension: I10

## 2011-03-07 HISTORY — DX: Tobacco use: Z72.0

## 2011-03-17 LAB — POCT I-STAT, CHEM 8
BUN: 12
Calcium, Ion: 1.15
Chloride: 104
Creatinine, Ser: 0.9
Glucose, Bld: 94

## 2011-03-17 LAB — URINALYSIS, ROUTINE W REFLEX MICROSCOPIC
Glucose, UA: NEGATIVE
Hgb urine dipstick: NEGATIVE
Ketones, ur: NEGATIVE
Nitrite: NEGATIVE

## 2011-03-17 LAB — DIFFERENTIAL
Lymphocytes Relative: 29
Monocytes Absolute: 0.5
Monocytes Relative: 6
Neutro Abs: 4.5

## 2011-03-17 LAB — RAPID URINE DRUG SCREEN, HOSP PERFORMED
Opiates: POSITIVE — AB
Tetrahydrocannabinol: NOT DETECTED

## 2011-03-17 LAB — CBC
Hemoglobin: 14.3
MCHC: 34.8
RBC: 4.41

## 2011-03-22 LAB — URINALYSIS, ROUTINE W REFLEX MICROSCOPIC
Bilirubin Urine: NEGATIVE
Hgb urine dipstick: NEGATIVE
Specific Gravity, Urine: 1.03
pH: 6

## 2011-03-22 LAB — RAPID URINE DRUG SCREEN, HOSP PERFORMED
Cocaine: NOT DETECTED
Opiates: POSITIVE — AB

## 2011-03-25 LAB — CBC
HCT: 43.6 % (ref 36.0–46.0)
HCT: 44.2 % (ref 36.0–46.0)
Hemoglobin: 15.1 g/dL — ABNORMAL HIGH (ref 12.0–15.0)
MCHC: 34.2 g/dL (ref 30.0–36.0)
Platelets: 205 10*3/uL (ref 150–400)
RBC: 4.55 MIL/uL (ref 3.87–5.11)
RDW: 13.7 % (ref 11.5–15.5)
RDW: 13.7 % (ref 11.5–15.5)
WBC: 7.9 10*3/uL (ref 4.0–10.5)

## 2011-03-25 LAB — POCT I-STAT, CHEM 8
BUN: 13 mg/dL (ref 6–23)
BUN: 14 mg/dL (ref 6–23)
Calcium, Ion: 1.1 mmol/L — ABNORMAL LOW (ref 1.12–1.32)
Calcium, Ion: 1.14 mmol/L (ref 1.12–1.32)
Chloride: 102 mEq/L (ref 96–112)
Glucose, Bld: 106 mg/dL — ABNORMAL HIGH (ref 70–99)
Hemoglobin: 14.6 g/dL (ref 12.0–15.0)
Potassium: 3 mEq/L — ABNORMAL LOW (ref 3.5–5.1)
Sodium: 139 mEq/L (ref 135–145)
TCO2: 28 mmol/L (ref 0–100)

## 2011-03-25 LAB — POCT CARDIAC MARKERS
Myoglobin, poc: 76.6 ng/mL (ref 12–200)
Myoglobin, poc: 79.4 ng/mL (ref 12–200)
Troponin i, poc: <0.05 ng/mL (ref 0.00–0.09)

## 2011-03-25 LAB — DIFFERENTIAL
Basophils Absolute: 0 10*3/uL (ref 0.0–0.1)
Basophils Absolute: 0 10*3/uL (ref 0.0–0.1)
Eosinophils Relative: 1 % (ref 0–5)
Eosinophils Relative: 3 % (ref 0–5)
Lymphocytes Relative: 15 % (ref 12–46)
Lymphocytes Relative: 19 % (ref 12–46)
Lymphs Abs: 1.5 10*3/uL (ref 0.7–4.0)
Monocytes Absolute: 0.3 10*3/uL (ref 0.1–1.0)
Monocytes Relative: 3 % (ref 3–12)
Neutro Abs: 5.9 10*3/uL (ref 1.7–7.7)
Neutrophils Relative %: 75 % (ref 43–77)

## 2011-03-25 LAB — ETHANOL: Alcohol, Ethyl (B): <5 mg/dL (ref 0–10)

## 2011-03-25 LAB — GLUCOSE, CAPILLARY: Glucose-Capillary: 123 mg/dL — ABNORMAL HIGH (ref 70–99)

## 2011-03-25 LAB — URINALYSIS, ROUTINE W REFLEX MICROSCOPIC
Bilirubin Urine: NEGATIVE
Hgb urine dipstick: NEGATIVE
Ketones, ur: NEGATIVE mg/dL
Nitrite: NEGATIVE
Specific Gravity, Urine: 1.022 (ref 1.005–1.030)
pH: 6.5 (ref 5.0–8.0)

## 2011-03-25 LAB — RAPID URINE DRUG SCREEN, HOSP PERFORMED
Amphetamines: NOT DETECTED
Benzodiazepines: POSITIVE — AB
Cocaine: NOT DETECTED
Opiates: POSITIVE — AB
Tetrahydrocannabinol: NOT DETECTED

## 2011-03-25 LAB — D-DIMER, QUANTITATIVE: D-Dimer, Quant: 0.29 ug/mL-FEU (ref 0.00–0.48)

## 2011-04-14 ENCOUNTER — Ambulatory Visit (INDEPENDENT_AMBULATORY_CARE_PROVIDER_SITE_OTHER): Payer: Medicaid Other | Admitting: Internal Medicine

## 2011-04-14 ENCOUNTER — Encounter: Payer: Self-pay | Admitting: Internal Medicine

## 2011-04-14 VITALS — BP 225/92 | HR 63 | Temp 98.6°F | Ht 65.0 in | Wt 193.0 lb

## 2011-04-14 DIAGNOSIS — I6350 Cerebral infarction due to unspecified occlusion or stenosis of unspecified cerebral artery: Secondary | ICD-10-CM

## 2011-04-14 DIAGNOSIS — L408 Other psoriasis: Secondary | ICD-10-CM

## 2011-04-14 DIAGNOSIS — I2510 Atherosclerotic heart disease of native coronary artery without angina pectoris: Secondary | ICD-10-CM

## 2011-04-14 DIAGNOSIS — M129 Arthropathy, unspecified: Secondary | ICD-10-CM

## 2011-04-14 DIAGNOSIS — R32 Unspecified urinary incontinence: Secondary | ICD-10-CM

## 2011-04-14 DIAGNOSIS — S92919A Unspecified fracture of unspecified toe(s), initial encounter for closed fracture: Secondary | ICD-10-CM

## 2011-04-14 DIAGNOSIS — I63511 Cerebral infarction due to unspecified occlusion or stenosis of right middle cerebral artery: Secondary | ICD-10-CM

## 2011-04-14 DIAGNOSIS — R7303 Prediabetes: Secondary | ICD-10-CM

## 2011-04-14 DIAGNOSIS — F329 Major depressive disorder, single episode, unspecified: Secondary | ICD-10-CM

## 2011-04-14 DIAGNOSIS — F411 Generalized anxiety disorder: Secondary | ICD-10-CM

## 2011-04-14 DIAGNOSIS — R197 Diarrhea, unspecified: Secondary | ICD-10-CM | POA: Insufficient documentation

## 2011-04-14 DIAGNOSIS — F419 Anxiety disorder, unspecified: Secondary | ICD-10-CM | POA: Insufficient documentation

## 2011-04-14 DIAGNOSIS — M1990 Unspecified osteoarthritis, unspecified site: Secondary | ICD-10-CM

## 2011-04-14 DIAGNOSIS — L409 Psoriasis, unspecified: Secondary | ICD-10-CM

## 2011-04-14 DIAGNOSIS — R7309 Other abnormal glucose: Secondary | ICD-10-CM

## 2011-04-14 DIAGNOSIS — I10 Essential (primary) hypertension: Secondary | ICD-10-CM

## 2011-04-14 DIAGNOSIS — S92502A Displaced unspecified fracture of left lesser toe(s), initial encounter for closed fracture: Secondary | ICD-10-CM

## 2011-04-14 DIAGNOSIS — E785 Hyperlipidemia, unspecified: Secondary | ICD-10-CM

## 2011-04-14 HISTORY — DX: Unspecified urinary incontinence: R32

## 2011-04-14 LAB — GLUCOSE, CAPILLARY: Glucose-Capillary: 90 mg/dL (ref 70–99)

## 2011-04-14 LAB — POCT GLYCOSYLATED HEMOGLOBIN (HGB A1C): Hemoglobin A1C: 4.8

## 2011-04-14 MED ORDER — LISINOPRIL 40 MG PO TABS: 40.0000 mg | ORAL_TABLET | Freq: Every day | ORAL | Status: DC

## 2011-04-14 MED ORDER — PRAVASTATIN SODIUM 40 MG PO TABS: 40.0000 mg | ORAL_TABLET | Freq: Every day | ORAL | Status: DC

## 2011-04-14 MED ORDER — CLONAZEPAM 1 MG PO TABS: 1.0000 mg | ORAL_TABLET | Freq: Two times a day (BID) | ORAL | Status: DC

## 2011-04-14 MED ORDER — FENOFIBRATE 54 MG PO TABS: 54.0000 mg | ORAL_TABLET | Freq: Every day | ORAL | Status: DC

## 2011-04-14 MED ORDER — CITALOPRAM HYDROBROMIDE 20 MG PO TABS: 20.0000 mg | ORAL_TABLET | Freq: Every day | ORAL | Status: DC

## 2011-04-14 MED ORDER — METOPROLOL TARTRATE 50 MG PO TABS: 50.0000 mg | ORAL_TABLET | Freq: Two times a day (BID) | ORAL | Status: DC

## 2011-04-14 MED ORDER — CLOPIDOGREL BISULFATE 75 MG PO TABS: 75.0000 mg | ORAL_TABLET | Freq: Every day | ORAL | Status: DC

## 2011-04-14 NOTE — Progress Notes (Signed)
Subjective:   [name] female   DOB: Aug 02, 1960 50 y.o.   MRN: [id number]  HPI: [name] is a 50 y.o. emale with PMh significant as outlined below who presented tot he clinic for the first time to establish care . She was admitted in 01/2011 for Right MCA and was found to have PFO. Furthermore she was started on Metformin for Prediabetes . She was discharged from the hospital to [facility] until 9/15. She was advised to follow up with Dr Pearlean Brownie to enroll in a study that Dr. Pearlean Brownie is involved in that will examine closure of a patent foramen ovale versus medical therapy in the setting of stroke. An  Appointment is schedule on 04/20/2011   Patient is accompanied by her sister ( a parametric) who is currently the main care take. The sister and the patient also takes care of their brother who is disabled. Patient's PCP  was  Dr Modena Morrow ( Urgent Care center, phone number [phone]).   Patient comes in with  multiple complains:  1. Patient noted that her function has improved during Rehab but she reports still difficulty and walking due to instability in gait and trouble with fine motoring ( closing her bra, etc.)  2.Joint pain: patient had joint pain since age 66 along with psoriasis on elbows and knees. Not clear if extensive work up was done in the past. Will obtain records from PCP  3.Incontinence: Patient reports that prior to the stroke in 01/2011 she was experiencing  urine loss with cough, laughing etc. But since the stroke she has no control at all. She has no urge to pee. She using an adult diaper on a daily basis.  4. Diarrhea: she reported that she had in the past episode of loose stool whenever she got to anxious. She reports that since August she has experienced on a daily basis loose , watery stool. She occasionally has accident and is not able to get to the rest room on time. Denies any blood, mucous in the stool, abdominal pain,back pain nausea or vomiting.  She started to take Metformin since August and had a stroke.   Past Medical History  Diagnosis Date  . PFO (patent foramen ovale) August 2012    PFO seen on TEE during hospitalization in 01/2011.  Patient to f/u with Dr. Pearlean Brownie, neurology, for enrollment in trial for medical treatment of PFO  . CAD (coronary artery disease) 2007    istory of MI with stent in 2007 by Dr. Park Breed, [facility]. Stent placement by Dr Karoline Caldwell   . Prediabetes 03/07/2011  . Hypertension 03/07/2011  . Hyperlipidemia 03/07/2011  . Tobacco abuse 03/07/2011  . Depression 03/07/2011  . Psoriasis 03/07/2011  . Acute right MCA stroke 01/27/11    Acute R MCA stroked  with L arm and leg weakness  . Anxiety 1980s    On Klonipin since age 83. Had addiction problem with Xanax which  Was therefore d/c . Seen in the past at [facility].   . Fractured toe 01/2011    History of left fifth toe proximal phalanx fracture when patient had a stroke (01/2011) and fell. Seen by Dr Victorino Dike.   . Incontinence 04/14/2011  . Arthritis 04/16/2011   Current Outpatient Prescriptions  Medication Sig Dispense Refill  . baclofen (LIORESAL) 10 MG tablet Take 5 mg by mouth 3 (three) times daily as needed. For muscle spasms       . citalopram (CELEXA) 20 MG  tablet Take 1 tablet (20 mg total) by mouth daily.  30 tablet  1  . clonazePAM (KLONOPIN) 1 MG tablet Take 1 tablet (1 mg total) by mouth 2 (two) times daily.  60 tablet  1  . clopidogrel (PLAVIX) 75 MG tablet Take 1 tablet (75 mg total) by mouth daily.  30 tablet  3  . fenofibrate 54 MG tablet Take 1 tablet (54 mg total) by mouth daily.  30 tablet  3  . lisinopril (PRINIVIL,ZESTRIL) 40 MG tablet Take 1 tablet (40 mg total) by mouth daily.  30 tablet  3  . metoprolol tartrate (LOPRESSOR) 50 MG tablet Take 1 tablet (50 mg total) by mouth 2 (two) times daily.  60 tablet  3  . pravastatin (PRAVACHOL) 40 MG tablet Take 1 tablet (40 mg total) by mouth daily.  30 tablet  3   Review of  Systems: Constitutional: Denies fever, chills, diaphoresis, appetite change and fatigue.  HEENT: Denies photophobia, eye pain, redness, hearing loss, ear pain, congestion, sore throat, rhinorrhea, sneezing, mouth sores, trouble swallowing, neck pain, neck stiffness and tinnitus.   Respiratory: Denies SOB, DOE, cough, chest tightness,  and wheezing.   Cardiovascular: Denies chest pain, palpitations  Gastrointestinal: Denies nausea, vomiting, abdominal pain, constipation, blood in stool and abdominal distention.  Genitourinary: Denies dysuria, hematuria, flank pain   Neurological: Denies dizziness, seizures, syncope, weakness, light-headedness, numbness and headaches.  Hematological: Denies adenopathy. Easy bruising, personal or family bleeding history  Psychiatric/Behavioral: Denies suicidal ideation,   Objective:  Physical Exam: Filed Vitals:   04/14/11 1533  BP: 225/92  Pulse: 63  Temp: 98.6 F (37 C)  TempSrc: Oral  Height: 5\' 5"  (1.651 m)  Weight: 193 lb (87.544 kg)   Constitutional: Vital signs reviewed.  Patient is a well-developed and well-nourished  in no acute distress and cooperative with exam. Alert and oriented x3.  Head: Normocephalic and atraumatic Ear: TM normal bilaterally Mouth: no erythema or exudates, MMM Eyes: PERRL, EOMI, conjunctivae normal, No scleral icterus.  Neck: Supple, Trachea midline normal ROM,  Cardiovascular: RRR, S1 normal, S2 normal, no MRG, pulses symmetric and intact bilaterally Pulmonary/Chest: CTAB, no wheezes, rales, or rhonchi Abdominal: Soft. Non-tender, non-distended, bowel sounds are normal, GU: no CVA tenderness Musculoskeletal: No joint deformities,or stiffness, and no nontender Hematology: no cervical, inginal, or axillary adenopathy.  Neurological: A&O x3, Strenght is decreased on LUE and LLE compared to the right. LUE and LLE 4/5.cranial nerve II-XII are grossly intact, no new focal motor deficit, sensory intact to light touch  bilaterally.  Skin: aised areas of inflamed skin covered with silvery white scaly skin  on elbow and knees bilaterally most consistent with plaques ( psoriatic changes)  Psychiatric: Patient had intermittent tearfull episodes during conversation. Patient sister reported since has been happening on  A regular basis.

## 2011-04-16 ENCOUNTER — Encounter: Payer: Self-pay | Admitting: Internal Medicine

## 2011-04-16 DIAGNOSIS — M1990 Unspecified osteoarthritis, unspecified site: Secondary | ICD-10-CM

## 2011-04-16 HISTORY — DX: Unspecified osteoarthritis, unspecified site: M19.90

## 2011-04-17 NOTE — Assessment & Plan Note (Addendum)
Significant . Patient has not been taking Klonopin since 2 weeks since she ran out of medication. Patient needs to follow up with Behavioral Health as soon as possible for reevaluation and possible changes in management.  Will continue Celexa and Klonipin. I have given a month supply.I asked her that she needs to follow up with Behavioral Health as soon as possible.

## 2011-04-17 NOTE — Assessment & Plan Note (Addendum)
Likely due to Metformin. Other DD include IBS. Will d/c Metformin especially in the setting of Hgb A1c of 4.8. Will continue to monitor.

## 2011-04-17 NOTE — Assessment & Plan Note (Addendum)
Blood pressure significantly elevated even after repeating. Reports about high blood pressure at recent ED visit for UTI. In the setting of CAD and CVA a tight control is mandatory. We incrased Metoprolol from 25 to 50 mg and continue Lisinopril at this point. Will have the patient come in within 4 days to reevaluate and possible changes in management. Patient was advised to be call the clinic or go to the ED if she is experiencing headache, dizziness, chest pain or SOB. Furthermore patient was recommended to check blood pressure on a daily basis and to bring the reading to the next clinic visit.

## 2011-04-17 NOTE — Assessment & Plan Note (Signed)
Significant but patient is not suicidal at this point.  Patient needs to follow up with Behavioral Health as soon as possible for reevaluation and possible changes in management.  Will continue Celexa and Klonipin. I have given a month supply.I asked her that she needs to follow up with Behavioral Health as soon as possible.

## 2011-04-17 NOTE — Assessment & Plan Note (Signed)
Hgb A1c today 4.8 . Will d/c Metformin in the setting of diarrhea and Hgb A1c. Patient needs to be closely followed given history of CAD and CVA

## 2011-04-17 NOTE — Assessment & Plan Note (Signed)
Unclear at this point if this overflow or urge incontinence . Will refer to Urology for further evaluation and management.

## 2011-04-17 NOTE — Assessment & Plan Note (Addendum)
At this point stable with baclofen. Will continue to monitor. Will obtain records form PCP to see what kind of evaluation was performed in the past and manage accordingly.  Marland Kitchen

## 2011-04-17 NOTE — Assessment & Plan Note (Signed)
Will continue current regimen. Consider to check lipid panel in 3 month for possible changes in management.

## 2011-04-17 NOTE — Assessment & Plan Note (Addendum)
Patient is currently on Plavix, b-blocker and statin. Will continue current regimen to maximize medical therapy. Patient will follow up with Dr Pearlean Brownie next week to discuss possible intervention for PFO.  Patient completed Rehab but I think patient would benefit if PT/OT is continued at as an outpatient. Therefore I will refer patient today. Patient would also benefit from Neuropsychiatric evaluation especially with a history of Anxiety and Depression.

## 2011-04-17 NOTE — Assessment & Plan Note (Signed)
At this point stable. Will continue to monitor. Will obtain records form PCP to see what kind of evaluation was performed in the past and manage accordingly.

## 2011-04-21 ENCOUNTER — Ambulatory Visit (INDEPENDENT_AMBULATORY_CARE_PROVIDER_SITE_OTHER): Payer: Medicaid Other | Admitting: Internal Medicine

## 2011-04-21 ENCOUNTER — Encounter: Payer: Self-pay | Admitting: Internal Medicine

## 2011-04-21 VITALS — BP 197/84 | HR 42 | Temp 97.4°F | Wt 201.4 lb

## 2011-04-21 DIAGNOSIS — R32 Unspecified urinary incontinence: Secondary | ICD-10-CM

## 2011-04-21 DIAGNOSIS — I10 Essential (primary) hypertension: Secondary | ICD-10-CM

## 2011-04-21 DIAGNOSIS — Z23 Encounter for immunization: Secondary | ICD-10-CM

## 2011-04-21 DIAGNOSIS — I6350 Cerebral infarction due to unspecified occlusion or stenosis of unspecified cerebral artery: Secondary | ICD-10-CM

## 2011-04-21 DIAGNOSIS — I63511 Cerebral infarction due to unspecified occlusion or stenosis of right middle cerebral artery: Secondary | ICD-10-CM

## 2011-04-21 LAB — BASIC METABOLIC PANEL
BUN: 16 mg/dL (ref 6–23)
Chloride: 108 mEq/L (ref 96–112)
Creat: 0.7 mg/dL (ref 0.50–1.10)
Potassium: 4.1 mEq/L (ref 3.5–5.3)

## 2011-04-21 MED ORDER — LISINOPRIL-HYDROCHLOROTHIAZIDE 20-12.5 MG PO TABS: 1.0000 | ORAL_TABLET | Freq: Two times a day (BID) | ORAL | Status: DC

## 2011-04-21 MED ORDER — METOPROLOL TARTRATE 50 MG PO TABS: ORAL_TABLET | ORAL | Status: DC

## 2011-04-21 NOTE — Patient Instructions (Signed)
Please follow up with PT and urology as prescribed by your PCP. Follow up in 2 weeks as your BP meds have been changed today.

## 2011-04-21 NOTE — Progress Notes (Signed)
  Subjective:    [name], female    DOB: [birth year], 50 y.o.   MRN: [id number]  HPIPatient is here today for a follow up of her BP.  BP is still high today.   Wants to get flu shot.  PT consult was done last time but no follow up was received.  Urology consult was called last visit but no follow up received by patient.  Review of Systems  Constitutional: Negative for fever, activity change and appetite change.  HENT: Negative for sore throat.   Respiratory: Negative for cough and shortness of breath.   Cardiovascular: Negative for chest pain and leg swelling.  Gastrointestinal: Negative for nausea, abdominal pain, diarrhea, constipation and abdominal distention.  Genitourinary: Negative for frequency, hematuria and difficulty urinating.  Neurological: Positive for weakness and numbness. Negative for dizziness and headaches.       Patient has left sided weakness and numbness  Psychiatric/Behavioral: Negative for suicidal ideas and behavioral problems.       Objective:   Physical Exam  Constitutional: She is oriented to person, place, and time. She appears well-developed and well-nourished.  HENT:  Head: Normocephalic and atraumatic.  Eyes: Conjunctivae and EOM are normal. Pupils are equal, round, and reactive to light. No scleral icterus.  Neck: Normal range of motion. Neck supple. No JVD present. No thyromegaly present.  Cardiovascular: Normal rate, regular rhythm, normal heart sounds and intact distal pulses.  Exam reveals no gallop and no friction rub.   No murmur heard. Pulmonary/Chest: Effort normal and breath sounds normal. No respiratory distress. She has no wheezes. She has no rales.  Abdominal: Soft. Bowel sounds are normal. She exhibits no distension and no mass. There is no tenderness. There is no rebound and no guarding.  Musculoskeletal: Normal range of motion. She exhibits no edema and no tenderness.  Lymphadenopathy:    She has no cervical  adenopathy.  Neurological: She is alert and oriented to person, place, and time.       Left sided hemiparesis  Psychiatric: She has a normal mood and affect. Her behavior is normal.          Assessment & Plan:

## 2011-04-21 NOTE — Assessment & Plan Note (Signed)
PT referral today. Patient is improving gradually. Had an appointment with Dr Pearlean Brownie yesterday.

## 2011-04-21 NOTE — Assessment & Plan Note (Signed)
Referral to Urology was made by Dr. Loistine Chance. Follow up on the referral today.

## 2011-04-21 NOTE — Assessment & Plan Note (Addendum)
BP is still very high but improved as compared to last time. I will check Bmet today. Her HR is <50 and I will change metopolol to 25 mg 3 times day. I will add HCTZ to the med regimen. Follow up in 2 weeks.

## 2011-04-25 ENCOUNTER — Telehealth: Payer: Self-pay | Admitting: *Deleted

## 2011-04-25 ENCOUNTER — Other Ambulatory Visit: Payer: Self-pay | Admitting: Internal Medicine

## 2011-04-25 DIAGNOSIS — R32 Unspecified urinary incontinence: Secondary | ICD-10-CM

## 2011-04-25 MED ORDER — DIAPERS & SUPPLIES MISC: 10.0000 "application " | Status: DC | PRN

## 2011-04-25 NOTE — Telephone Encounter (Signed)
Pt calls and states she is using appr 32 diapers a day, she is paying out of pocket for these, actually her sister pays for them, money is tight and she states they need help, also she states her appt at [facility] urology is in December and she doesn't know what to do until then. i am sending this to dr Loistine Chance and donnat.

## 2011-04-25 NOTE — Telephone Encounter (Signed)
I send a prescription to the pharmacy.

## 2011-04-26 ENCOUNTER — Telehealth: Payer: Self-pay | Admitting: Licensed Clinical Social Worker

## 2011-04-26 NOTE — Telephone Encounter (Signed)
Pt. called me back re: multiple social work issues:  There is also an order for mental health from Dr. Loistine Chance due to anxiety and depression.   Pt. suffered a stroke in August of this year and is waiting on Disability which told her she will have a decision in 10 to 15 days.  She has Medicaid which has been helpful but no income at this time.   Her foodstamps are $170 per month. [name] lives w/ her brother and sister.  Her brother is in a wheelchair and her sister has been helping her w/ meds and diapers.   Smoking Cessation:  Patient is on the patches and actively trying to quit smoking.   The patient reported that she will be going to PT this week and also has cardiology appointments.  She is overwhelmed by medical issues and doctor appointments.   The patient is having a continuous incontinence problem and is in need of urology asap.  She is asking for resources re: depends because she is going thru 20 to 30 depends per day.   Mental Health:  Patient went to MH years ago but found that having different doctors at each visit was not helpful.  A/P:  1)  Told [name] about [facility] that may save her on Medication copays.   2)  Educated  about the [employer] and will send her information.  She may be able to buy discounted depends there.  3)  Educated [name] about the new [employer] and walk-in hours for both regular and emergency Mental Health.  Will send information in the mail.  4)   Will ask nursing if urology can be moved to earlier time.   Patient said she would have to ask her sister about Phys[facility] Pharm[facility] delivery service.  Patient interested in all resources however, she is overwhelmed w/ appointments right now.  I advised her to f/u with Mental Health at her convenience but if she had an emergency or meltdown to go immediately to [facility].  Physican/nursing advises that Celexa/Klonopin combination seems to be working for  patient.

## 2011-05-03 ENCOUNTER — Ambulatory Visit: Payer: Medicaid Other | Admitting: Occupational Therapy

## 2011-05-03 ENCOUNTER — Ambulatory Visit: Payer: Medicaid Other | Attending: Internal Medicine | Admitting: Physical Therapy

## 2011-05-03 DIAGNOSIS — R279 Unspecified lack of coordination: Secondary | ICD-10-CM | POA: Insufficient documentation

## 2011-05-03 DIAGNOSIS — M6281 Muscle weakness (generalized): Secondary | ICD-10-CM | POA: Insufficient documentation

## 2011-05-03 DIAGNOSIS — Z5189 Encounter for other specified aftercare: Secondary | ICD-10-CM | POA: Insufficient documentation

## 2011-05-03 DIAGNOSIS — I69998 Other sequelae following unspecified cerebrovascular disease: Secondary | ICD-10-CM | POA: Insufficient documentation

## 2011-05-05 ENCOUNTER — Encounter: Payer: Medicaid Other | Admitting: Internal Medicine

## 2011-05-06 ENCOUNTER — Ambulatory Visit: Payer: Medicaid Other | Admitting: Occupational Therapy

## 2011-05-11 ENCOUNTER — Ambulatory Visit: Payer: Medicaid Other | Admitting: Physical Therapy

## 2011-05-11 ENCOUNTER — Ambulatory Visit: Payer: Medicaid Other | Admitting: Occupational Therapy

## 2011-05-16 ENCOUNTER — Encounter: Payer: Self-pay | Admitting: Internal Medicine

## 2011-05-18 ENCOUNTER — Ambulatory Visit: Payer: Medicaid Other | Admitting: Physical Therapy

## 2011-05-18 ENCOUNTER — Ambulatory Visit: Payer: Medicaid Other | Admitting: Occupational Therapy

## 2011-05-20 ENCOUNTER — Ambulatory Visit: Payer: Medicaid Other | Admitting: *Deleted

## 2011-05-20 ENCOUNTER — Ambulatory Visit: Payer: Medicaid Other | Admitting: Physical Therapy

## 2011-05-24 ENCOUNTER — Ambulatory Visit: Payer: Medicaid Other | Attending: Internal Medicine | Admitting: Occupational Therapy

## 2011-05-24 ENCOUNTER — Ambulatory Visit: Payer: Medicaid Other | Admitting: Physical Therapy

## 2011-05-24 DIAGNOSIS — R279 Unspecified lack of coordination: Secondary | ICD-10-CM | POA: Insufficient documentation

## 2011-05-24 DIAGNOSIS — M6281 Muscle weakness (generalized): Secondary | ICD-10-CM | POA: Insufficient documentation

## 2011-05-24 DIAGNOSIS — I69998 Other sequelae following unspecified cerebrovascular disease: Secondary | ICD-10-CM | POA: Insufficient documentation

## 2011-05-24 DIAGNOSIS — Z5189 Encounter for other specified aftercare: Secondary | ICD-10-CM | POA: Insufficient documentation

## 2011-05-25 ENCOUNTER — Ambulatory Visit: Payer: Medicaid Other | Admitting: Physical Therapy

## 2011-05-25 ENCOUNTER — Ambulatory Visit: Payer: Medicaid Other | Admitting: Occupational Therapy

## 2011-05-27 ENCOUNTER — Telehealth: Payer: Self-pay | Admitting: *Deleted

## 2011-05-27 NOTE — Telephone Encounter (Signed)
Call from pt states that she is having a lot of pain after her PT and OT.  Pt has history of a Stroke on right side is affecting her left side.  C/O of soreness in the left side.   Has been having pain when she raked leaves this past week-end.  Wants something non-narcotic if possible.  Ibuprofen helps a little bit. Tries not to take a lot.  No redness or heat in the area.  Hurts to even extend her arm scale of 10.  Uses the [facility] on [address]. Angelina Ok, RN 05/27/2011 4:48 PM.

## 2011-05-30 ENCOUNTER — Telehealth: Payer: Self-pay | Admitting: *Deleted

## 2011-05-30 MED ORDER — TRAMADOL HCL 50 MG PO TABS: 50.0000 mg | ORAL_TABLET | Freq: Four times a day (QID) | ORAL | Status: AC | PRN

## 2011-05-30 NOTE — Telephone Encounter (Signed)
Pt called with c/o pain in left shoulder. She is asking for pain meds.  She does not want a narcotic. She has used tramadol in past with pain relief.   She is going to PT for pain in shoulder, and they apply warm compresses to area. Onset of pain after stroke, august 9th. PT for 4 weeks Pt # [phone]  Will she need to be seen for this? Uses [facility] on [address]

## 2011-05-30 NOTE — Telephone Encounter (Signed)
Tramadol 50 was on recent D/C summary med list. Will Rx small quantity. IF needs refill, will need to be seen and eval.

## 2011-05-30 NOTE — Telephone Encounter (Signed)
Pt informed and voices understanding 

## 2011-06-01 ENCOUNTER — Ambulatory Visit: Payer: Medicaid Other | Admitting: Physical Therapy

## 2011-06-01 ENCOUNTER — Encounter: Payer: Medicaid Other | Admitting: Occupational Therapy

## 2011-06-03 ENCOUNTER — Ambulatory Visit: Payer: Medicaid Other | Admitting: Occupational Therapy

## 2011-06-03 ENCOUNTER — Ambulatory Visit: Payer: Medicaid Other | Admitting: Physical Therapy

## 2011-06-03 ENCOUNTER — Encounter: Payer: Medicaid Other | Admitting: Occupational Therapy

## 2011-06-06 ENCOUNTER — Encounter: Payer: Medicaid Other | Admitting: Internal Medicine

## 2011-06-06 ENCOUNTER — Other Ambulatory Visit: Payer: Self-pay | Admitting: Internal Medicine

## 2011-06-06 DIAGNOSIS — F419 Anxiety disorder, unspecified: Secondary | ICD-10-CM

## 2011-06-06 MED ORDER — CLONAZEPAM 1 MG PO TABS: 1.0000 mg | ORAL_TABLET | Freq: Two times a day (BID) | ORAL | Status: DC

## 2011-06-07 NOTE — Telephone Encounter (Signed)
Refill for Klonopin 1 mg # 60 called to [facility] on [address].  Angelina Ok, RN 12.18/2012 10:11 AM

## 2011-06-08 ENCOUNTER — Ambulatory Visit: Payer: Medicaid Other | Admitting: Occupational Therapy

## 2011-06-08 ENCOUNTER — Ambulatory Visit: Payer: Medicaid Other | Admitting: Physical Therapy

## 2011-06-10 ENCOUNTER — Ambulatory Visit: Payer: Medicaid Other | Admitting: Physical Therapy

## 2011-06-10 ENCOUNTER — Encounter: Payer: Medicaid Other | Admitting: Occupational Therapy

## 2011-06-22 ENCOUNTER — Encounter: Payer: Self-pay | Admitting: Internal Medicine

## 2011-06-27 ENCOUNTER — Ambulatory Visit: Payer: Medicaid Other | Attending: Internal Medicine | Admitting: *Deleted

## 2011-06-27 ENCOUNTER — Ambulatory Visit: Payer: Medicaid Other | Admitting: Physical Therapy

## 2011-06-27 DIAGNOSIS — R279 Unspecified lack of coordination: Secondary | ICD-10-CM | POA: Insufficient documentation

## 2011-06-27 DIAGNOSIS — I69998 Other sequelae following unspecified cerebrovascular disease: Secondary | ICD-10-CM | POA: Insufficient documentation

## 2011-06-27 DIAGNOSIS — M6281 Muscle weakness (generalized): Secondary | ICD-10-CM | POA: Insufficient documentation

## 2011-06-27 DIAGNOSIS — Z5189 Encounter for other specified aftercare: Secondary | ICD-10-CM | POA: Insufficient documentation

## 2011-06-29 ENCOUNTER — Other Ambulatory Visit (HOSPITAL_COMMUNITY)
Admission: RE | Admit: 2011-06-29 | Discharge: 2011-06-29 | Disposition: A | Discharge status: Home / Self Care | Payer: Medicaid Other | Source: Ambulatory Visit | Attending: Internal Medicine | Admitting: Internal Medicine

## 2011-06-29 ENCOUNTER — Other Ambulatory Visit: Payer: Self-pay | Admitting: *Deleted

## 2011-06-29 ENCOUNTER — Encounter: Payer: Self-pay | Admitting: Internal Medicine

## 2011-06-29 ENCOUNTER — Ambulatory Visit (INDEPENDENT_AMBULATORY_CARE_PROVIDER_SITE_OTHER): Payer: Medicaid Other | Admitting: Internal Medicine

## 2011-06-29 VITALS — BP 100/62 | HR 52 | Temp 97.1°F | Ht 65.0 in | Wt 194.3 lb

## 2011-06-29 DIAGNOSIS — F17200 Nicotine dependence, unspecified, uncomplicated: Secondary | ICD-10-CM

## 2011-06-29 DIAGNOSIS — I6350 Cerebral infarction due to unspecified occlusion or stenosis of unspecified cerebral artery: Secondary | ICD-10-CM

## 2011-06-29 DIAGNOSIS — I2510 Atherosclerotic heart disease of native coronary artery without angina pectoris: Secondary | ICD-10-CM

## 2011-06-29 DIAGNOSIS — Z01419 Encounter for gynecological examination (general) (routine) without abnormal findings: Secondary | ICD-10-CM | POA: Insufficient documentation

## 2011-06-29 DIAGNOSIS — I10 Essential (primary) hypertension: Secondary | ICD-10-CM

## 2011-06-29 DIAGNOSIS — Z720 Tobacco use: Secondary | ICD-10-CM

## 2011-06-29 DIAGNOSIS — F411 Generalized anxiety disorder: Secondary | ICD-10-CM

## 2011-06-29 DIAGNOSIS — F419 Anxiety disorder, unspecified: Secondary | ICD-10-CM

## 2011-06-29 DIAGNOSIS — I63511 Cerebral infarction due to unspecified occlusion or stenosis of right middle cerebral artery: Secondary | ICD-10-CM

## 2011-06-29 DIAGNOSIS — Z299 Encounter for prophylactic measures, unspecified: Secondary | ICD-10-CM | POA: Insufficient documentation

## 2011-06-29 DIAGNOSIS — R32 Unspecified urinary incontinence: Secondary | ICD-10-CM

## 2011-06-29 DIAGNOSIS — E785 Hyperlipidemia, unspecified: Secondary | ICD-10-CM

## 2011-06-29 MED ORDER — CYCLOBENZAPRINE HCL 10 MG PO TABS: 10.0000 mg | ORAL_TABLET | Freq: Three times a day (TID) | ORAL | Status: AC | PRN

## 2011-06-29 NOTE — Assessment & Plan Note (Signed)
Currently receiving physical therapy and occupational therapy. Per patient this has helped significantly to improve her functional status. I encouraged her to continue to do any special exercises recommended by physical therapy regular basis.

## 2011-06-29 NOTE — Assessment & Plan Note (Signed)
Patient reports that she is doing a lot better and would like to have a psychiatrist or psychologist mostly to speak about her past to relief her anxiety. She was seen at the mental health department in the past but did not had good experience and would like to see someone else. She saw on 06/19/2004 a psychologist for disability. She noted that this was very helpful and would like to continue therefore I'll refer the patient to social worker to assist with counseling.

## 2011-06-29 NOTE — Progress Notes (Signed)
Subjective:   [name] female   DOB: 05-20-1961 51 y.o.   MRN: [id number]  HPI: [name] is a 51 y.o. female with past medical history significant as outlined below who presented to the clinic for an office visit.  1. Urinary incontinence; evaluated by Per MacDiarmid (Urology):Neurogenic detrusor overactivity from stroke and nocturnal diuresis. Has improved. Will followup on 07/11/10  2. Blood pressure: Norvasc 5 mg was started recently by Dr.Ganji.Patient denies any symptoms. 3. Tobacco abuse:  quit 8/ 2012 4.Stroke: Receiving pt/ot . Improved her functional status . 5. baclofen : As prescribed by Dr. Pearlean Brownie for muscle spasm . Patient reports that it made her to space out. It made her muscle spasm worsen and she was experiencing significant stiffness in her left arm .     Past Medical History  Diagnosis Date  . PFO (patent foramen ovale) August 2012    PFO seen on TEE during hospitalization in 01/2011.  Patient to f/u with Dr. Pearlean Brownie, neurology, for enrollment in trial for medical treatment of PFO  . CAD (coronary artery disease) 2007    istory of MI with stent in 2007 by Dr. Park Breed, [facility]. Stent placement by Dr Karoline Caldwell   . Prediabetes 03/07/2011  . Hypertension 03/07/2011  . Hyperlipidemia 03/07/2011  . Tobacco abuse 03/07/2011  . Depression 03/07/2011  . Psoriasis 03/07/2011  . Acute right MCA stroke 01/27/11    Acute R MCA stroked  with L arm and leg weakness  . Anxiety 1980s    On Klonipin since age 86. Had addiction problem with Xanax which  Was therefore d/c . Seen in the past at [facility].   . Fractured toe 01/2011    History of left fifth toe proximal phalanx fracture when patient had a stroke (01/2011) and fell. Seen by Dr Victorino Dike.   . Incontinence 04/14/2011  . Arthritis 04/16/2011   Current Outpatient Prescriptions  Medication Sig Dispense Refill  . baclofen (LIORESAL) 10 MG tablet Take 5 mg by mouth 3 (three) times daily as needed. For  muscle spasms      . citalopram (CELEXA) 20 MG tablet TAKE ONE TABLET BY MOUTH EVERY DAY  30 tablet  1  . clonazePAM (KLONOPIN) 1 MG tablet Take 1 tablet (1 mg total) by mouth 2 (two) times daily.  60 tablet  1  . clopidogrel (PLAVIX) 75 MG tablet Take 1 tablet (75 mg total) by mouth daily.  30 tablet  3  . Diapers & Supplies MISC 10 application by Does not apply route as needed.  300 each  1  . fenofibrate 54 MG tablet Take 1 tablet (54 mg total) by mouth daily.  30 tablet  3  . lisinopril-hydrochlorothiazide (PRINZIDE) 20-12.5 MG per tablet Take 1 tablet by mouth 2 (two) times daily.  60 tablet  11  . metoprolol (LOPRESSOR) 50 MG tablet Take 1/2 tab(25MG ) 3 times a day  60 tablet  3  . pravastatin (PRAVACHOL) 40 MG tablet Take 1 tablet (40 mg total) by mouth daily.  30 tablet  3   Review of Systems: Constitutional: Denies fever, chills, diaphoresis, appetite change and fatigue.  Respiratory: Denies SOB, DOE, cough, chest tightness,  and wheezing.   Cardiovascular: Denies chest pain, palpitations and leg swelling.  Gastrointestinal: Denies nausea, vomiting, abdominal pain, diarrhea, constipation, blood in stool and abdominal distention.  Genitourinary: Denies dysuria, urgency, frequency, hematuria, flank pain and difficulty urinating.  Musculoskeletal:Noted occasionally arthralgias. Skin: Denies pallor, rash and wound.  Neurological: Denies dizziness, , weakness, light-headedness, numbness and headaches.    Objective:  Physical Exam: Filed Vitals:   06/29/11 0927  BP: 100/62  Pulse: 52  Temp: 97.1 F (36.2 C)  TempSrc: Oral  Height: 5\' 5"  (1.651 m)  Weight: 194 lb 4.8 oz (88.134 kg)   Constitutional: Vital signs reviewed.  Patient is a well-developed and well-nourished woman in no acute distress and cooperative with exam. Alert and oriented x3. .  Neck: Supple,  Cardiovascular: RRR, S1 norMusculoskeletal: No joint deformities, erythema, or stiffness, ROM full and no nontender    mal, S2 normal, no MRG, pulses symmetric and intact bilaterally Pulmonary/Chest: CTAB, no wheezes, rales, or rhonchi Abdominal: Soft. Non-tender, non-distended, bowel sounds are normal, no masses, organomegaly, or guarding present.  GU: no CVA tenderness Neurological: A&O x3, left-sided hemiparesis improved. Motor strength intact on the right hand side.  no new focal deficits noted. Skin: aised areas of inflamed skin covered with silvery white scaly skin on elbow and knees bilaterally most consistent with plaques ( psoriatic changes)

## 2011-06-29 NOTE — Assessment & Plan Note (Addendum)
Patient reports she has quit smoking since  01/2011

## 2011-06-29 NOTE — Assessment & Plan Note (Signed)
Norvasc 5 mg was added to metoprolol and bringing Prinzide by Dr. Jacinto Halim ( cardiology). Blood pressure is well controlled currently. Patient is not experiencing any dizziness or lightheadedness at this point. I recommended the patient if she is experiencing any kind of these symptoms to call the clinic for further advice.  BP Readings from Last 3 Encounters:  06/29/11 100/62  04/21/11 197/84  04/14/11 225/92

## 2011-06-29 NOTE — Patient Instructions (Signed)
If you are experiencing any dizziness especially when getting up please call the clinic for further advice.

## 2011-06-29 NOTE — Assessment & Plan Note (Signed)
Pap smear obtained today Tetanus given today

## 2011-06-29 NOTE — Assessment & Plan Note (Signed)
Will check lipid panel today for possible changes in management. Lab Results  Component Value Date   CHOL 166 01/27/2011   Lab Results  Component Value Date   HDL 27* 01/27/2011   Lab Results  Component Value Date   LDLCALC 86 01/27/2011   Lab Results  Component Value Date   TRIG 264* 01/27/2011   Lab Results  Component Value Date   CHOLHDL 6.1 01/27/2011   No results found for this basename: LDLDIRECT

## 2011-06-29 NOTE — Progress Notes (Signed)
Addended by: Ebbie Latus on: 06/29/2011 03:44 PM   Modules accepted: Orders

## 2011-06-29 NOTE — Assessment & Plan Note (Signed)
Patient will followup with Urology on January 12

## 2011-06-30 ENCOUNTER — Ambulatory Visit: Payer: Medicaid Other | Admitting: Occupational Therapy

## 2011-06-30 ENCOUNTER — Ambulatory Visit: Payer: Medicaid Other | Admitting: Physical Therapy

## 2011-06-30 ENCOUNTER — Telehealth: Payer: Self-pay | Admitting: Licensed Clinical Social Worker

## 2011-06-30 LAB — COMPREHENSIVE METABOLIC PANEL
Alkaline Phosphatase: 74 U/L (ref 39–117)
BUN: 17 mg/dL (ref 6–23)
CO2: 26 mEq/L (ref 19–32)
Creat: 0.89 mg/dL (ref 0.50–1.10)
Glucose, Bld: 89 mg/dL (ref 70–99)
Sodium: 140 mEq/L (ref 135–145)
Total Bilirubin: 0.4 mg/dL (ref 0.3–1.2)
Total Protein: 6.3 g/dL (ref 6.0–8.3)

## 2011-06-30 LAB — LIPID PANEL
Cholesterol: 155 mg/dL (ref 0–200)
HDL: 32 mg/dL — ABNORMAL LOW (ref >39)
Total CHOL/HDL Ratio: 4.8 Ratio
Triglycerides: 215 mg/dL — ABNORMAL HIGH (ref <150)

## 2011-07-05 ENCOUNTER — Ambulatory Visit: Payer: Medicaid Other | Admitting: Occupational Therapy

## 2011-07-05 ENCOUNTER — Ambulatory Visit: Payer: Medicaid Other | Admitting: Physical Therapy

## 2011-07-06 ENCOUNTER — Telehealth: Payer: Self-pay | Admitting: Licensed Clinical Social Worker

## 2011-07-06 NOTE — Telephone Encounter (Signed)
Per referral from Dr. Loistine Chance, I have provided patient two options for counseling.  Patient has Medicaid.  I have given her information about [employer] and also another option, [facility], if she would like a smaller office.  I've advised the patient to let Dr. Loistine Chance know when she has made an appointment.

## 2011-07-07 ENCOUNTER — Ambulatory Visit: Payer: Medicaid Other | Admitting: Physical Therapy

## 2011-07-07 ENCOUNTER — Ambulatory Visit: Payer: Medicaid Other | Admitting: Occupational Therapy

## 2011-07-15 ENCOUNTER — Ambulatory Visit: Payer: Medicaid Other | Admitting: Occupational Therapy

## 2011-08-03 ENCOUNTER — Ambulatory Visit: Payer: Medicaid Other | Attending: Internal Medicine | Admitting: Occupational Therapy

## 2011-08-03 DIAGNOSIS — Z5189 Encounter for other specified aftercare: Secondary | ICD-10-CM | POA: Insufficient documentation

## 2011-08-03 DIAGNOSIS — M6281 Muscle weakness (generalized): Secondary | ICD-10-CM | POA: Insufficient documentation

## 2011-08-03 DIAGNOSIS — I69998 Other sequelae following unspecified cerebrovascular disease: Secondary | ICD-10-CM | POA: Insufficient documentation

## 2011-08-03 DIAGNOSIS — R279 Unspecified lack of coordination: Secondary | ICD-10-CM | POA: Insufficient documentation

## 2011-08-05 ENCOUNTER — Other Ambulatory Visit: Payer: Self-pay | Admitting: Internal Medicine

## 2011-08-05 DIAGNOSIS — F419 Anxiety disorder, unspecified: Secondary | ICD-10-CM

## 2011-08-05 DIAGNOSIS — I10 Essential (primary) hypertension: Secondary | ICD-10-CM

## 2011-08-05 DIAGNOSIS — I2510 Atherosclerotic heart disease of native coronary artery without angina pectoris: Secondary | ICD-10-CM

## 2011-08-08 ENCOUNTER — Other Ambulatory Visit: Payer: Self-pay | Admitting: Internal Medicine

## 2011-08-08 MED ORDER — CLONAZEPAM 1 MG PO TABS: 1.0000 mg | ORAL_TABLET | Freq: Two times a day (BID) | ORAL | Status: DC

## 2011-08-08 NOTE — Telephone Encounter (Signed)
Pt called and said the referral Jesse Sans recommended were for counseling but are not doctors and can't write Rx's. She is out of meds now and sent in request on 2/14.  She is requesting Rx's refills be written for more than 2 - 3 months because it takes so long to get them filled.  Can Klonopin and celexa refills be written for more than one month.

## 2011-08-10 ENCOUNTER — Encounter: Payer: Medicaid Other | Admitting: Occupational Therapy

## 2011-08-12 ENCOUNTER — Emergency Department (HOSPITAL_COMMUNITY): Payer: Medicaid Other

## 2011-08-12 ENCOUNTER — Other Ambulatory Visit: Payer: Self-pay

## 2011-08-12 ENCOUNTER — Emergency Department (HOSPITAL_COMMUNITY)
Admission: EM | Admit: 2011-08-12 | Discharge: 2011-08-13 | Disposition: A | Discharge status: Home / Self Care | Payer: Medicaid Other | Attending: Emergency Medicine | Admitting: Emergency Medicine

## 2011-08-12 ENCOUNTER — Encounter (HOSPITAL_COMMUNITY): Payer: Self-pay

## 2011-08-12 DIAGNOSIS — Y92009 Unspecified place in unspecified non-institutional (private) residence as the place of occurrence of the external cause: Secondary | ICD-10-CM | POA: Insufficient documentation

## 2011-08-12 DIAGNOSIS — I252 Old myocardial infarction: Secondary | ICD-10-CM | POA: Insufficient documentation

## 2011-08-12 DIAGNOSIS — Z79899 Other long term (current) drug therapy: Secondary | ICD-10-CM | POA: Insufficient documentation

## 2011-08-12 DIAGNOSIS — Y93E1 Activity, personal bathing and showering: Secondary | ICD-10-CM | POA: Insufficient documentation

## 2011-08-12 DIAGNOSIS — I10 Essential (primary) hypertension: Secondary | ICD-10-CM | POA: Insufficient documentation

## 2011-08-12 DIAGNOSIS — W010XXA Fall on same level from slipping, tripping and stumbling without subsequent striking against object, initial encounter: Secondary | ICD-10-CM | POA: Insufficient documentation

## 2011-08-12 DIAGNOSIS — I2510 Atherosclerotic heart disease of native coronary artery without angina pectoris: Secondary | ICD-10-CM | POA: Insufficient documentation

## 2011-08-12 DIAGNOSIS — E785 Hyperlipidemia, unspecified: Secondary | ICD-10-CM | POA: Insufficient documentation

## 2011-08-12 DIAGNOSIS — S20219A Contusion of unspecified front wall of thorax, initial encounter: Secondary | ICD-10-CM

## 2011-08-12 HISTORY — DX: Old myocardial infarction: I25.2

## 2011-08-12 NOTE — ED Notes (Signed)
Pt reports falling into bathtub landing on left side of chest- Pt denies chest pain yet report left sided rib pain denies shortness of breath.

## 2011-08-12 NOTE — ED Notes (Signed)
EKG given to EDP, Wentz.

## 2011-08-13 MED ORDER — CYCLOBENZAPRINE HCL 10 MG PO TABS
5.0000 mg | ORAL_TABLET | Freq: Once | ORAL | Status: AC
  Administered 2011-08-13: 5 mg via ORAL
  Filled 2011-08-13: qty 2
  Filled 2011-08-13: qty 1

## 2011-08-13 MED ORDER — CYCLOBENZAPRINE HCL 10 MG PO TABS: 5.0000 mg | ORAL_TABLET | Freq: Two times a day (BID) | ORAL | Status: AC | PRN

## 2011-08-13 MED ORDER — KETOROLAC TROMETHAMINE 60 MG/2ML IM SOLN
30.0000 mg | Freq: Once | INTRAMUSCULAR | Status: AC
  Administered 2011-08-13: 30 mg via INTRAMUSCULAR
  Filled 2011-08-13: qty 2

## 2011-08-13 NOTE — ED Notes (Signed)
Sister report this pt has a drug addiction problem- addicted to benzo and narcotic- EDPA Tiffany made aware

## 2011-08-13 NOTE — ED Provider Notes (Signed)
Medical screening examination/treatment/procedure(s) were performed by non-physician practitioner and as supervising physician I was immediately available for consultation/collaboration.   Malvin Johns, MD 08/13/11 0330

## 2011-08-13 NOTE — ED Provider Notes (Signed)
History     CSN: [id number]  Arrival date & time 08/12/11  2234   First MD Initiated Contact with Patient 08/12/11 2336      Chief Complaint  Patient presents with  . Fall  . Chest Pain    (Consider location/radiation/quality/duration/timing/severity/associated sxs/prior treatment) HPI  Pt presents to the ED with complaints of rib pain. She fell while in the tub on to her ribs 3 days ago and it has been hurting since. She denied syncope, weakness, head injury, or cardiac chest pain. Pt is able to take a full breath and is in no acute distress. No gross deformity. Pt admits addiction to narcotics and requests not to have any pain medication.  Past Medical History  Diagnosis Date  . PFO (patent foramen ovale) August 2012    PFO seen on TEE during hospitalization in 01/2011.  Patient to f/u with Dr. Leonie Man, neurology, for enrollment in trial for medical treatment of PFO  . CAD (coronary artery disease) 2007    istory of MI with stent in 2007 by Dr. Chancy Milroy, [facility]. Stent placement by Dr Cleatis Polka   . Prediabetes 03/07/2011  . Hypertension 03/07/2011  . Hyperlipidemia 03/07/2011  . Tobacco abuse 03/07/2011  . Depression 03/07/2011  . Psoriasis 03/07/2011  . Acute right MCA stroke 01/27/11    Acute R MCA stroked  with L arm and leg weakness  . Anxiety 1980s    On Klonipin since age 33. Had addiction problem with Xanax which  Was therefore d/c . Seen in the past at [facility].   . Fractured toe 01/2011    History of left fifth toe proximal phalanx fracture when patient had a stroke (01/2011) and fell. Seen by Dr Doran Durand.   . Incontinence 04/14/2011  . Arthritis 04/16/2011  . Myocardial infarct, old     Past Surgical History  Procedure Date  . Cardiac stent     No family history on file.  History  Substance Use Topics  . Smoking status: Former Smoker    Types: Cigarettes    Quit date: 01/26/2011  . Smokeless tobacco: Not on file  . Alcohol Use: No    OB History      Grav Para Term Preterm Abortions TAB SAB Ect Mult Living                  Review of Systems  All other systems reviewed and are negative.    Allergies  Azor  Home Medications   Current Outpatient Rx  Name Route Sig Dispense Refill  . AMLODIPINE BESYLATE 5 MG PO TABS Oral Take 5 mg by mouth daily. Prescribed by Dr Einar Gip    . CITALOPRAM HYDROBROMIDE 20 MG PO TABS  TAKE ONE TABLET BY MOUTH EVERY DAY 30 tablet 2  . CLONAZEPAM 1 MG PO TABS Oral Take 1 tablet (1 mg total) by mouth 2 (two) times daily. 60 tablet 2  . CLOPIDOGREL BISULFATE 75 MG PO TABS  TAKE ONE TABLET BY MOUTH EVERY DAY 30 tablet 2  . CYCLOBENZAPRINE HCL 10 MG PO TABS Oral Take 0.5 tablets (5 mg total) by mouth 2 (two) times daily as needed for muscle spasms. 10 tablet 0  . DIAPERS & SUPPLIES MISC Does not apply 10 application by Does not apply route as needed. 300 each 1    Adult Diapers.  Marland Kitchen LISINOPRIL-HYDROCHLOROTHIAZIDE 20-12.5 MG PO TABS Oral Take 1 tablet by mouth 2 (two) times daily. 60 tablet 11  . METOPROLOL TARTRATE 50 MG  PO TABS  Take 25 mg three times a day 60 tablet 2  . PRAVASTATIN SODIUM 40 MG PO TABS Oral Take 1 tablet (40 mg total) by mouth daily. 30 tablet 3    BP 135/75  Pulse 63  Temp(Src) 97.8 F (36.6 C) (Oral)  Resp 16  Wt 200 lb (90.719 kg)  SpO2 98%  Physical Exam  Nursing note and vitals reviewed. Constitutional: She appears well-developed and well-nourished. No distress.  HENT:  Head: Normocephalic and atraumatic.  Eyes: Pupils are equal, round, and reactive to light.  Neck: Normal range of motion. Neck supple.  Cardiovascular: Normal rate and regular rhythm.   Pulmonary/Chest: Effort normal.    Abdominal: Soft.  Musculoskeletal: She exhibits tenderness (left 7th and 8th ribs).  Neurological: She is alert.  Skin: Skin is warm and dry.    ED Course  Procedures (including critical care time)  Labs Reviewed - No data to display Dg Chest 2 View  08/12/2011  *RADIOLOGY  REPORT*  Clinical Data: Fall, chest wall pain.  CHEST - 2 VIEW  Comparison: 01/27/2011  Findings: Right basilar density, likely atelectasis.  No pneumothorax or effusion.  No visible rib fracture.  Heart is normal size.  IMPRESSION: Right basilar atelectasis.  Original Report Authenticated By: Raelyn Number, M.D.     1. Rib contusion       MDM  Pt given Rx for Flexeril. Pt referred back to PCP. Pts sister pulled my nurse aside and stated she does not know wither the patient fell or if she is drug seeking.        Linus Mako, PA 08/13/11 Benancio Deeds

## 2011-08-13 NOTE — Discharge Instructions (Signed)
Blunt Chest Trauma Chest injury caused by blunt trauma can be very painful. The pain is worse with movement or breathing. Most often these injuries result in bruised or fractured ribs. Minor rib fractures may not be seen on the initial X-rays. Most broken and bruised ribs from blunt chest injuries are not serious and get better within 1 to 3 weeks with rest and mild pain medicine. Internal organs (heart, lungs) can be injured with blunt trauma. Further examination may be needed. Limit activities until you can move around without much pain. Do not do any strenuous work until your injury is healed. Ice packs may be put on the injured area for 20 to 30 minutes several times daily to reduce pain. Rib belts may also be used to reduce pain and should be worn as directed by your caregiver. Take several deep breaths hourly to keep your lungs clear.  SEEK IMMEDIATE MEDICAL CARE IF:   You develop increased pain, shortness of breath, or cough up blood.   You develop nausea, vomiting, or abdominal pain.   You develop fever, dizziness, weakness, or fainting.  Document Released: 07/14/2004 Document Revised: 02/16/2011 Document Reviewed: 06/06/2005 [facility] Patient Information 2012 [place].Chest Contusion You have been checked for injuries to your chest. Your caregiver has not found injuries serious enough to require hospitalization. It is common to have bruises and sore muscles after an injury. These tend to feel worse the first 24 hours. You may gradually develop more stiffness and soreness over the next several hours to several days. This usually feels worse the first morning following your injury. After a few days, you will usually begin to improve. The amount of improvement depends on the amount of damage. Following the accident, if the pain in any area continues to increase or you develop new areas of pain, you should see your primary caregiver or return to the Emergency Department for  re-evaluation. HOME CARE INSTRUCTIONS   Put ice on sore areas every 2 hours for 20 minutes while awake for the next 2 days.   Drink extra fluids. Do not drink alcohol.   Activity as tolerated. Lifting may make pain worse.   Only take over-the-counter or prescription medicines for pain, discomfort, or fever as directed by your caregiver. Do not use aspirin. This may increase bruising or increase bleeding.  SEEK IMMEDIATE MEDICAL CARE IF:   There is a worsening of any of the problems that brought you in for care.   Shortness of breath, dizziness or fainting develop.   You have chest pain, difficulty breathing, or develop pain going down the left arm or up into jaw.   You feel sick to your stomach (nausea), vomiting or sweats.   You have increasing belly (abdominal) discomfort.   There is blood in your urine, stool, or if you vomit blood.   There is pain in either shoulder in an area where a shoulder strap would be.   You have feelings of lightheadedness, or if you should have a fainting episode.   You have numbness, tingling, weakness, or problems with the use of your arms or legs.   Severe headaches not relieved with medications develop.   You have a change in bowel or bladder control.   There is increasing pain in any areas of the body.  If you feel your symptoms are worsening, and you are not able to see your primary caregiver, return to the Emergency Department immediately. MAKE SURE YOU:   Understand these instructions.   Will  watch your condition.   Will get help right away if you are not doing well or get worse.  Document Released: 03/01/2001 Document Revised: 02/16/2011 Document Reviewed: 01/23/2008 [facility] Patient Information 2012 [place].

## 2011-08-17 ENCOUNTER — Encounter: Payer: Medicaid Other | Admitting: Occupational Therapy

## 2011-08-24 ENCOUNTER — Other Ambulatory Visit: Payer: Self-pay | Admitting: *Deleted

## 2011-08-24 ENCOUNTER — Other Ambulatory Visit: Payer: Self-pay | Admitting: Internal Medicine

## 2011-08-24 ENCOUNTER — Encounter: Payer: Medicaid Other | Admitting: Occupational Therapy

## 2011-08-24 DIAGNOSIS — I63511 Cerebral infarction due to unspecified occlusion or stenosis of right middle cerebral artery: Secondary | ICD-10-CM

## 2011-08-24 NOTE — Telephone Encounter (Signed)
Pt called for refill on Fenofibrate. Fenofibrate was discontinued by Dr. Newt Lukes at Ms Price's last PCP visit.

## 2011-08-24 NOTE — Telephone Encounter (Signed)
Fenofibrate was discontinued by Dr. Newt Lukes at Sandra [name]'s last PCP visit.  It will therefore not be refilled at this time.

## 2011-08-31 ENCOUNTER — Encounter: Payer: Medicaid Other | Admitting: Occupational Therapy

## 2011-09-14 ENCOUNTER — Telehealth: Payer: Self-pay | Admitting: *Deleted

## 2011-09-14 NOTE — Telephone Encounter (Signed)
Pt will come for appt 3/28 due to transportation issues, 1345 dr Silverio Decamp

## 2011-09-14 NOTE — Telephone Encounter (Signed)
Called pt back to offer an appt for today. Stated she had called Dr Irven Shelling office also and wanted to wait on his response before scheduling an appt her at the clinic. Asked pt to call me back after talking to Dr Irven Shelling office.

## 2011-09-14 NOTE — Telephone Encounter (Signed)
Anyone with a history of stroke who has new or recurrent CNS sx should be seen.

## 2011-09-14 NOTE — Telephone Encounter (Signed)
Call from pt. C/o tingling and numbness of hands and arms; no pain. Started 2 days ago. Hx CVA. Saw Dr Einar Gip in Jan with the same symptoms after her CVA but was told she was"fine"; residual effect from the CVA.  Pt is concern; appt w/Dr Newt Lukes is not until May.

## 2011-09-15 ENCOUNTER — Ambulatory Visit (INDEPENDENT_AMBULATORY_CARE_PROVIDER_SITE_OTHER): Payer: Medicaid Other | Admitting: Internal Medicine

## 2011-09-15 ENCOUNTER — Encounter: Payer: Self-pay | Admitting: Internal Medicine

## 2011-09-15 VITALS — BP 100/60 | HR 68 | Temp 97.0°F | Wt 200.6 lb

## 2011-09-15 DIAGNOSIS — I10 Essential (primary) hypertension: Secondary | ICD-10-CM

## 2011-09-15 DIAGNOSIS — M792 Neuralgia and neuritis, unspecified: Secondary | ICD-10-CM

## 2011-09-15 DIAGNOSIS — G5690 Unspecified mononeuropathy of unspecified upper limb: Secondary | ICD-10-CM

## 2011-09-15 DIAGNOSIS — L409 Psoriasis, unspecified: Secondary | ICD-10-CM

## 2011-09-15 DIAGNOSIS — L408 Other psoriasis: Secondary | ICD-10-CM

## 2011-09-15 MED ORDER — TRAMADOL HCL 50 MG PO TABS: 50.0000 mg | ORAL_TABLET | Freq: Two times a day (BID) | ORAL | Status: AC

## 2011-09-15 NOTE — Assessment & Plan Note (Addendum)
Patient does have skin involvement.  She did have limited ROM of the left shoulder and tenderness to palpation of the left shoulder joint and blade so I am concerned if she has psoriatic arthritis.   -Will get a left shoulder Xray to evaluate the joint -Will refer patient to rheumatology

## 2011-09-15 NOTE — Assessment & Plan Note (Signed)
Differential dx include residual from CVA vs neuropathic vs psoriatic arthritis given that she has been psoriasis with skin involvement since the age of 43.  She states that she tried both Baclofen and Flexeril in the past without much relief.  We discussed the option of Gabapentin but she states that she took it in the past prior to her stroke without relief as well.  She has tried Tramadol in the past which works well for her.  She does not want stronger narcotics given her hx of med dependence. -Prescribed Tramadol 62m po bid #60, no RF -Will get Xray of her left shoulder -Refer to rheumatology -She may need to go back and see Dr. SLeonie Manif her symptoms worsen.  I instructed patient to come to the ED if she develops weakness, slurred speech or any signs of stroke.

## 2011-09-15 NOTE — Assessment & Plan Note (Signed)
Well-controlled, continue current regimen

## 2011-09-15 NOTE — Progress Notes (Signed)
HPI:Ms. March Rummage is a 51 yo woman with PMH of CAD with stents placement and PFO seen on TEE, right MCA stroke in 01/2011 with residual left sided weakness and pain/numbness in both of her arms/hands presents today for chronic pain and numbness in both hands since stroke.  She reports that the symptoms are not new and has not gotten progressive worse, just that she is frustrated because it has not resolved.  She was seen by Dr. Einar Gip and Leonie Man and was told that it is residual from her stroke from August. She denies any slurred speech or new weakness in her extremities or any facial droop.  She states that she was prescribed Baclofen in the past which was too strong for her and that Flexeril 55m did not work for her as well.  Patient also reports hx of psoriasis since the age of 89and has not been evaluated by a rheumatologist.  She now has left shoulder pain and has not had any imaging of her shoulder.  She wants to know if she has psoriatic arthritis?  Denies any fever, chills, N/V, chest pain, SOB.  ROS: as per HPI  PE: General: alert, well-developed, and cooperative to examination.  Lungs: normal respiratory effort, no accessory muscle use, normal breath sounds, no crackles, and no wheezes. Heart: normal rate, regular rhythm, no murmur, no gallop, and no rub.  Abdomen: soft, non-tender, normal bowel sounds, no distention, no guarding, no rebound tenderness Msk: no joint swelling, no joint warmth, and no redness over joints.  Extremities: No cyanosis, clubbing, edema Neurologic: alert & oriented X3, cranial nerves II-XII intact, strength 5/5 in right extremities, and 4/5 in left extremities, sensation intact to light touch, and gait normal.  Skin: diffused scaly erythematous patches on her arms and face.

## 2011-09-15 NOTE — Patient Instructions (Signed)
You may take Tramadol 57m twice daily for your pain Will schedule for left shoulder Xray and I will call you with any abnormal results Will refer you to rheumatologist Follow up with primary care physician as needed

## 2011-10-03 ENCOUNTER — Telehealth: Payer: Self-pay | Admitting: *Deleted

## 2011-10-03 NOTE — Telephone Encounter (Signed)
Pt would like to cont with neuro PT for left arm and left leg. Went to neuro PT on [address] in [place] Magenta Schmiesing RN 10/03/11 5:30PM

## 2011-10-12 ENCOUNTER — Other Ambulatory Visit: Payer: Self-pay | Admitting: *Deleted

## 2011-10-12 DIAGNOSIS — F419 Anxiety disorder, unspecified: Secondary | ICD-10-CM

## 2011-10-12 MED ORDER — CITALOPRAM HYDROBROMIDE 20 MG PO TABS: 20.0000 mg | ORAL_TABLET | Freq: Every day | ORAL | Status: DC

## 2011-10-14 ENCOUNTER — Other Ambulatory Visit: Payer: Self-pay | Admitting: *Deleted

## 2011-10-14 DIAGNOSIS — F419 Anxiety disorder, unspecified: Secondary | ICD-10-CM

## 2011-10-17 MED ORDER — CLONAZEPAM 1 MG PO TABS: 1.0000 mg | ORAL_TABLET | Freq: Two times a day (BID) | ORAL | Status: DC

## 2011-10-17 NOTE — Telephone Encounter (Signed)
Klonopin rx called to [place].

## 2011-10-24 ENCOUNTER — Encounter: Payer: Medicaid Other | Admitting: Internal Medicine

## 2011-11-01 ENCOUNTER — Other Ambulatory Visit: Payer: Self-pay | Admitting: *Deleted

## 2011-11-01 DIAGNOSIS — I2510 Atherosclerotic heart disease of native coronary artery without angina pectoris: Secondary | ICD-10-CM

## 2011-11-02 ENCOUNTER — Telehealth: Payer: Self-pay | Admitting: *Deleted

## 2011-11-02 MED ORDER — CLOPIDOGREL BISULFATE 75 MG PO TABS: 75.0000 mg | ORAL_TABLET | Freq: Every day | ORAL | Status: DC

## 2011-11-02 NOTE — Telephone Encounter (Signed)
Pt called needs refill on new med from [place]. Unable to call them - pt told them she was not coming back. Pt doing well with new med. Unable to see Dr Newt Lukes till 12/12/11 - appt made about renewing med for pt. Hilda Blades Jury Caserta RN 11/02/11 10:10AM

## 2011-11-02 NOTE — Telephone Encounter (Signed)
Need to discuss for how long the patient needs to be on Plavix and why she is not on Aspirin. Stent placed in 2008. Followed by Dr Einar Gip

## 2011-11-04 ENCOUNTER — Ambulatory Visit (INDEPENDENT_AMBULATORY_CARE_PROVIDER_SITE_OTHER): Payer: Medicaid Other | Admitting: Internal Medicine

## 2011-11-04 ENCOUNTER — Other Ambulatory Visit: Payer: Self-pay | Admitting: *Deleted

## 2011-11-04 ENCOUNTER — Encounter: Payer: Self-pay | Admitting: Internal Medicine

## 2011-11-04 VITALS — BP 133/75 | HR 56 | Temp 97.7°F | Wt 201.2 lb

## 2011-11-04 DIAGNOSIS — I63511 Cerebral infarction due to unspecified occlusion or stenosis of right middle cerebral artery: Secondary | ICD-10-CM

## 2011-11-04 DIAGNOSIS — F329 Major depressive disorder, single episode, unspecified: Secondary | ICD-10-CM

## 2011-11-04 DIAGNOSIS — I10 Essential (primary) hypertension: Secondary | ICD-10-CM

## 2011-11-04 DIAGNOSIS — I6350 Cerebral infarction due to unspecified occlusion or stenosis of unspecified cerebral artery: Secondary | ICD-10-CM

## 2011-11-04 DIAGNOSIS — I2510 Atherosclerotic heart disease of native coronary artery without angina pectoris: Secondary | ICD-10-CM

## 2011-11-04 LAB — COMPREHENSIVE METABOLIC PANEL
Alkaline Phosphatase: 75 U/L (ref 39–117)
Creat: 0.9 mg/dL (ref 0.50–1.10)
Glucose, Bld: 71 mg/dL (ref 70–99)
Sodium: 140 mEq/L (ref 135–145)
Total Bilirubin: 0.4 mg/dL (ref 0.3–1.2)
Total Protein: 6.6 g/dL (ref 6.0–8.3)

## 2011-11-04 MED ORDER — VILAZODONE HCL 40 MG PO TABS: 40.0000 mg | ORAL_TABLET | Freq: Every day | ORAL | Status: DC

## 2011-11-04 MED ORDER — METOPROLOL TARTRATE 25 MG PO TABS: 25.0000 mg | ORAL_TABLET | Freq: Two times a day (BID) | ORAL | Status: DC

## 2011-11-04 MED ORDER — AMLODIPINE BESYLATE 5 MG PO TABS: 5.0000 mg | ORAL_TABLET | Freq: Every day | ORAL | Status: DC

## 2011-11-04 MED ORDER — PRAVASTATIN SODIUM 40 MG PO TABS: 40.0000 mg | ORAL_TABLET | Freq: Every day | ORAL | Status: DC

## 2011-11-04 MED ORDER — LISINOPRIL-HYDROCHLOROTHIAZIDE 20-12.5 MG PO TABS: 1.0000 | ORAL_TABLET | Freq: Two times a day (BID) | ORAL | Status: DC

## 2011-11-04 NOTE — Progress Notes (Signed)
Subjective:     [name], female   DOB: 1960-09-09, 51 y.o.   MRN: [id number]  HPI Sandra [name] is a 51 year old woman with pmh significant for HTN, HLD, CVA in 2012, Depression and Anxiety who presents for general check-up.   Pt was recently evaluated by her neurologist Dr. Leonie Man Monday May 13th, 2013 for regular check-up. She reports she had slightly slow heart rate at 45. No medication adjustments were made. Patient is on metoprolol 25 mg tid, and reports she has been feeling fatigued lately. She was also prescribed baclofen for muscle spasticity related to the stroke. She reports she is doing well with it. Dr. Leonie Man wants her to enroll in a study where she would be on medication that would raise HDL. Patient states she will likely enroll.   She also went to the [facility] for evaluation of depression and anxiety. She was given a sample for Vilazodone which is SSRI. Pt has been on this medication for 50 month and states it has been helping with depression. She has stopped taking the celexa.   Patient has no other complaints or concerns today. She denies chest pain, cough, sob, headache, N/V, changes in abdominal and urinary character.   Review of Systems  All other systems reviewed and are negative.       Objective:   Physical Exam  Constitutional: She is oriented to person, place, and time. She appears well-developed.  HENT:  Head: Normocephalic and atraumatic.  Eyes: EOM are normal. Pupils are equal, round, and reactive to light.  Neck: Normal range of motion. Neck supple.  Cardiovascular: Normal rate, regular rhythm and normal heart sounds.   Pulmonary/Chest: Effort normal and breath sounds normal.  Abdominal: Soft. Bowel sounds are normal.  Neurological: She is alert and oriented to person, place, and time.       Increased tone in left leg Equal strength 5/5 in both upper and extremities

## 2011-11-04 NOTE — Assessment & Plan Note (Signed)
Stable. Continue Plavix, statin, beta blocker and risk factor management.  Patient quit smoking in 2012.

## 2011-11-04 NOTE — Patient Instructions (Signed)
Please follow up in 6 months. Please take all medications as directed, and if you cannot afford the vilazodone the depression medication, please contact the clinic to change it.

## 2011-11-04 NOTE — Assessment & Plan Note (Signed)
Patient received sample of vilazodone (SSRI) which she reports is more effective than celexa. Pt would like to continue this medication if she can afford it. If she cannot afford it, can try increasing Celexa to 40 mg because she was on low dose before.

## 2011-11-04 NOTE — Assessment & Plan Note (Signed)
Managed by Dr. Leonie Man. On Plavix, statin and beta blocker. Baclofen for spasticity.

## 2011-11-04 NOTE — Assessment & Plan Note (Addendum)
Lab Results  Component Value Date   NA 140 06/29/2011   K 3.9 06/29/2011   CL 101 06/29/2011   CO2 26 06/29/2011   BUN 17 06/29/2011   CREATININE 0.89 06/29/2011   CREATININE 0.48* 02/01/2011    BP Readings from Last 3 Encounters:  11/04/11 133/75  09/15/11 100/60  08/13/11 134/80    Assessment: Hypertension control:  controlled  Progress toward goals:  at goal Barriers to meeting goals:  no barriers identified  Plan: Hypertension treatment:  continue current medications check cmet Will reduce Metoprolol to 25 mg BID in setting of symptomatic bradycardia.

## 2011-11-08 ENCOUNTER — Other Ambulatory Visit: Payer: Self-pay | Admitting: Internal Medicine

## 2011-11-08 DIAGNOSIS — E876 Hypokalemia: Secondary | ICD-10-CM | POA: Insufficient documentation

## 2011-11-08 MED ORDER — POTASSIUM CHLORIDE ER 10 MEQ PO TBCR: 10.0000 meq | EXTENDED_RELEASE_TABLET | Freq: Every day | ORAL | Status: DC

## 2011-11-16 ENCOUNTER — Telehealth: Payer: Self-pay | Admitting: *Deleted

## 2011-11-16 ENCOUNTER — Other Ambulatory Visit: Payer: Self-pay | Admitting: Internal Medicine

## 2011-11-16 DIAGNOSIS — F419 Anxiety disorder, unspecified: Secondary | ICD-10-CM

## 2011-11-16 MED ORDER — CLONAZEPAM 1 MG PO TABS: 1.0000 mg | ORAL_TABLET | Freq: Two times a day (BID) | ORAL | Status: DC

## 2011-11-16 NOTE — Telephone Encounter (Signed)
Pt called back and states she has been off klonopin for 3 weeks and her sister will not give her her meds because she owes them rent.  Pt will have the money on June 3 and pt is asking for a 6 day Rx for klonopin.  # [id number]

## 2011-11-16 NOTE — Telephone Encounter (Signed)
I have the Rx for Klonopin but pt has not called with the pharmacy she wants med called to.   Unable to reach pt by phone.   Will wait for return call.

## 2011-11-16 NOTE — Telephone Encounter (Signed)
  Call from [facility] asking if it was okay for an early refill on pt's Klonopin.  [facility] did not fill because they would not accept a check.  [place] [facility] called in Rx to [place].  I verified early refill okay. I also called pt's sister @ [phone] to verify she was holding pt's meds from her and she said she was.  She will give them back in June. She did say pt was taking more then she should.

## 2011-11-16 NOTE — Telephone Encounter (Signed)
Pt called to say she is now at a homeless shelter.  She was told by [facility] that we had called in a RX for potassium and she wanted to know why. I told her that on her last labs her Kcl was slightly low and she needed to take the potassium as ordered to get it back to the normal range.  She has a hard time getting meds but was going to ask for help and try and get the med.   I stressed the importance of getting the Kcl.  Also scheduled an appointment with Dr Newt Lukes for august for f/u.

## 2011-11-16 NOTE — Telephone Encounter (Signed)
Received a call from [facility] stating pt was in the Emergency Room looking for meds.  I gave order to pharmacy for klonopin and Potassium, also instructions on Plavix.

## 2011-11-18 ENCOUNTER — Telehealth: Payer: Self-pay | Admitting: *Deleted

## 2011-11-18 NOTE — Telephone Encounter (Signed)
Pt called in stating she only received $400 for her monthly check and can not pay her sister.  Her sister will not give her her meds until July.  Pt asked me to call her sister to say pt needs her meds.   I told pt I did not want to interfer with her family situation.  Please call whe she is out of meds and we will refill.  She is going to talk to her brother and see if he can help.

## 2011-11-30 ENCOUNTER — Telehealth: Payer: Self-pay | Admitting: Internal Medicine

## 2011-11-30 NOTE — Telephone Encounter (Signed)
[name] called for advice about her blood pressure.  She states that she has been working in the house for the last two days and has been feeling tired.  She has been taking her blood pressure at home and noticed that it has been running higher then usual.  She usually runs below 712 systolic but she has been as high as 180.  Diastolic usually less then 70 but she has been as high as 110.  She has no air conditioning and only a fan around the house.  She denies fevers, chills, nausea, vomiting, headache, blurry vision, dizziness on standing, chest pain, shortness of breath, or weakness.  She also has not had her amlodipine today but will be getting this evening.    Plan: Hold 1 dose of Lisinopril/HCTZ tomorrow morning Push fluids today (water) and get rest in a cool area. Recheck blood pressure tomorrow and if still elevated call clinic for possible evaluation.  She was in agreement with this plan.  She was cautioned that if she developed any other symptoms that she should be seen in the nearest Urgent care or ER.

## 2011-12-03 ENCOUNTER — Telehealth: Payer: Self-pay | Admitting: Internal Medicine

## 2011-12-03 NOTE — Telephone Encounter (Signed)
Patient states that she has a history of endometriosis and she is having some spotting for last week.  She had menopause about 2 years ago and never had any spotting until 1 week ago when she became sexually active again.  Plan: Patient was advised to call the GYN office or our clinic on Monday to schedule an appointment for evaluation.

## 2011-12-06 ENCOUNTER — Other Ambulatory Visit: Payer: Self-pay | Admitting: *Deleted

## 2011-12-06 DIAGNOSIS — F419 Anxiety disorder, unspecified: Secondary | ICD-10-CM

## 2011-12-06 DIAGNOSIS — M62838 Other muscle spasm: Secondary | ICD-10-CM

## 2011-12-06 MED ORDER — CLONAZEPAM 1 MG PO TABS: 1.0000 mg | ORAL_TABLET | Freq: Two times a day (BID) | ORAL | Status: DC

## 2011-12-06 MED ORDER — BACLOFEN 10 MG PO TABS: 10.0000 mg | ORAL_TABLET | Freq: Every day | ORAL | Status: DC

## 2011-12-07 NOTE — Telephone Encounter (Signed)
Rx called in to pharmacy. 

## 2011-12-08 ENCOUNTER — Telehealth: Payer: Self-pay | Admitting: *Deleted

## 2011-12-08 NOTE — Telephone Encounter (Signed)
Pt called having left foot pain past few months. Has bruise on feet. On Plavix. Getting married in July. Suggest to make appt to check foot - prefers to wait. To call if she changes her mind. Hilda Blades [place] RN 12/08/11 4:40PM

## 2011-12-12 ENCOUNTER — Encounter: Payer: Medicaid Other | Admitting: Internal Medicine

## 2012-01-05 ENCOUNTER — Encounter: Payer: Medicaid Other | Admitting: Internal Medicine

## 2012-01-09 ENCOUNTER — Ambulatory Visit (INDEPENDENT_AMBULATORY_CARE_PROVIDER_SITE_OTHER): Payer: Medicaid Other | Admitting: Internal Medicine

## 2012-01-09 ENCOUNTER — Encounter: Payer: Self-pay | Admitting: Internal Medicine

## 2012-01-09 VITALS — BP 108/72 | HR 70 | Temp 98.0°F | Ht 65.5 in | Wt 188.2 lb

## 2012-01-09 DIAGNOSIS — E785 Hyperlipidemia, unspecified: Secondary | ICD-10-CM

## 2012-01-09 DIAGNOSIS — Z0000 Encounter for general adult medical examination without abnormal findings: Secondary | ICD-10-CM | POA: Insufficient documentation

## 2012-01-09 DIAGNOSIS — I10 Essential (primary) hypertension: Secondary | ICD-10-CM

## 2012-01-09 DIAGNOSIS — R232 Flushing: Secondary | ICD-10-CM | POA: Insufficient documentation

## 2012-01-09 DIAGNOSIS — N951 Menopausal and female climacteric states: Secondary | ICD-10-CM

## 2012-01-09 DIAGNOSIS — F17200 Nicotine dependence, unspecified, uncomplicated: Secondary | ICD-10-CM

## 2012-01-09 DIAGNOSIS — R61 Generalized hyperhidrosis: Secondary | ICD-10-CM

## 2012-01-09 DIAGNOSIS — Z1231 Encounter for screening mammogram for malignant neoplasm of breast: Secondary | ICD-10-CM

## 2012-01-09 DIAGNOSIS — E876 Hypokalemia: Secondary | ICD-10-CM

## 2012-01-09 DIAGNOSIS — Z720 Tobacco use: Secondary | ICD-10-CM | POA: Insufficient documentation

## 2012-01-09 DIAGNOSIS — IMO0001 Reserved for inherently not codable concepts without codable children: Secondary | ICD-10-CM

## 2012-01-09 DIAGNOSIS — Z8673 Personal history of transient ischemic attack (TIA), and cerebral infarction without residual deficits: Secondary | ICD-10-CM

## 2012-01-09 LAB — CBC WITH DIFFERENTIAL/PLATELET
Basophils Absolute: 0 10*3/uL (ref 0.0–0.1)
Basophils Relative: 0 % (ref 0–1)
MCHC: 33.7 g/dL (ref 30.0–36.0)
Neutro Abs: 5.1 10*3/uL (ref 1.7–7.7)
Neutrophils Relative %: 59 % (ref 43–77)
RDW: 15 % (ref 11.5–15.5)
WBC: 8.6 10*3/uL (ref 4.0–10.5)

## 2012-01-09 MED ORDER — VENLAFAXINE HCL ER 37.5 MG PO CP24: 37.5000 mg | ORAL_CAPSULE | Freq: Every day | ORAL | Status: DC

## 2012-01-09 MED ORDER — FAMOTIDINE 20 MG PO TABS: 20.0000 mg | ORAL_TABLET | Freq: Two times a day (BID) | ORAL | Status: DC

## 2012-01-09 MED ORDER — DOCUSATE SODIUM 100 MG PO CAPS: 100.0000 mg | ORAL_CAPSULE | Freq: Every day | ORAL | Status: DC | PRN

## 2012-01-09 NOTE — Addendum Note (Signed)
Addended by: Annett Gula on: 01/09/2012 11:20 PM   Modules accepted: Orders

## 2012-01-09 NOTE — Progress Notes (Signed)
Subjective:    [name], female    DOB: 06-11-1961, 51 y.o.   MRN: [id number]  HPI Comments: Sandra [name] (new last name is Sandra [name])  51 y.o woman PMH right MCA stroke 2012 (leaving left upper arm contracture, abnl gait), PRO, CAD s/p cardiac stent, HTN, HLD, Depression/Anxiety presenting for complaints of hot flashes.  She states her LMP was at age 51 y.o but she is having worsening hot flashes x 1 week.  She denies any vaginal bleeding, odor, fever, chills. Admits to a dry cough. She denies any sick contacts.  She is seeking tx for hot flashes.    She also would like Rx refills of klonopin 1 mg bid (pt taking for 2 years and helps).  She does not follow with psychiatry anymore  Pt needs Rx refill K dur 10 meq qd, and Viibryd 40 mg qd.    Pt having left upper arm pain since stroke tx'ed with Baclofen.  She states pain today is 4/10.  She also request Rx refill of Baclofen which was just increased to 10 mg bid for pain 2/2 contracture in her left upper ext.  Dr. Pearlean Brownie is Rx that medication and will provide the pt with a rx refill.    SH: pt got married 01/07/11.  She is not longer homeless and lives with her husband in [place], [place]. She smokes 2ppd.  She has 1 child.  She used to abuse narcotics but no longer takes narcotics.       Review of Systems  Constitutional: Positive for diaphoresis. Negative for fever and chills.       Pt denies sick contacts +hot flashes   Respiratory: Positive for cough. Negative for shortness of breath.        +dry cough  Cardiovascular: Negative for chest pain and leg swelling.  Gastrointestinal:       Denies ab pain and bleeding per rectum +constipation (pt does not eat many fruits/vegs) +chest pain after eating   Genitourinary: Negative for dysuria, vaginal bleeding and vaginal discharge.  Musculoskeletal: Positive for gait problem.       +left upper arm deformed after stroke +gait abnl after stroke       Objective:   Physical Exam    Constitutional: She is oriented to person, place, and time. Vital signs are normal. She appears well-developed and well-nourished. She is cooperative.  HENT:  Head: Normocephalic and atraumatic.  Mouth/Throat: Oropharynx is clear and moist and mucous membranes are normal. She has dentures.  Eyes: Conjunctivae are normal. Pupils are equal, round, and reactive to light.  Cardiovascular: Normal rate, regular rhythm, S1 normal, S2 normal and normal heart sounds.   Pulmonary/Chest: Effort normal and breath sounds normal. No respiratory distress.  Abdominal: Normal appearance and bowel sounds are normal. There is tenderness in the left upper quadrant.  Musculoskeletal:       Arms:      No edema lower ext b/l   Neurological: She is alert and oriented to person, place, and time. She has normal strength. Gait abnormal.       5/5 motor strength all 4 ext  abnl gait 2/2 right MCA 2012  Skin: Skin is warm, dry and intact.       Bruises to lower ext b/l  Psychiatric: She has a normal mood and affect. Her speech is normal and behavior is normal.          Assessment & Plan:  Follow up 3 months with PCP

## 2012-01-09 NOTE — Assessment & Plan Note (Addendum)
Most likely related to menopause Will not do HRT d/t h/o MI and stenting RX Effexor XR 37.5 mg qd  Obtained tsh, cbc

## 2012-01-09 NOTE — Assessment & Plan Note (Signed)
Controlled 108/72 today  Cont tx Norvasc 5 mg, Prinzide 20-12.5 mg qd, Lopressor 25 mg bid Obtained BMP today

## 2012-01-09 NOTE — Assessment & Plan Note (Signed)
Pt currently smoking 2 ppd Disc smoking cessation with pt Pt does not want to try tx today

## 2012-01-09 NOTE — Assessment & Plan Note (Signed)
Pap neg 06/2011 Pt due for mammogram and colonoscopy-referrals placed

## 2012-01-09 NOTE — Assessment & Plan Note (Signed)
Pending bmp will follow  Pt on supplementation kdur 10 meq qd

## 2012-01-09 NOTE — Assessment & Plan Note (Signed)
Lipid Panel     Component Value Date/Time   CHOL 155 06/29/2011 1014   TRIG 215* 06/29/2011 1014   HDL 32* 06/29/2011 1014   CHOLHDL 4.8 06/29/2011 1014   VLDL 43* 06/29/2011 1014   LDLCALC 80 06/29/2011 1014   ldl at goal  Pt cont pravastatin 40 mg qhs

## 2012-01-10 ENCOUNTER — Encounter: Payer: Self-pay | Admitting: Gastroenterology

## 2012-01-10 ENCOUNTER — Encounter: Payer: Self-pay | Admitting: Internal Medicine

## 2012-01-10 LAB — BASIC METABOLIC PANEL WITH GFR
BUN: 17 mg/dL (ref 6–23)
Creat: 0.84 mg/dL (ref 0.50–1.10)
GFR, Est African American: >89 mL/min
GFR, Est Non African American: 81 mL/min
Potassium: 3.6 mEq/L (ref 3.5–5.3)
Sodium: 142 mEq/L (ref 135–145)

## 2012-01-10 LAB — T4, FREE: Free T4: 0.92 ng/dL (ref 0.80–1.80)

## 2012-01-10 NOTE — Progress Notes (Signed)
Agree with plan 

## 2012-01-11 ENCOUNTER — Other Ambulatory Visit: Payer: Self-pay | Admitting: *Deleted

## 2012-01-11 DIAGNOSIS — E876 Hypokalemia: Secondary | ICD-10-CM

## 2012-01-11 MED ORDER — POTASSIUM CHLORIDE ER 10 MEQ PO TBCR: 10.0000 meq | EXTENDED_RELEASE_TABLET | Freq: Every day | ORAL | Status: DC

## 2012-01-11 NOTE — Addendum Note (Signed)
Addended by: Annett Gula on: 01/11/2012 10:09 AM   Modules accepted: Orders, Medications

## 2012-01-11 NOTE — Telephone Encounter (Signed)
Message left for pt to call the Clinics about her appointments.  Pt has been scheduled with [facility] for her Colonoscopy and prep visit.  Pt has been scheduled with [facility]- Dr. Russella Dar on 02/02/2012 at 10:00 AM and the Colonoscopy has been scheduled for 02/16/2012 11:00 AM to arrive by 10:00 AM.  Angelina Ok, RN 01/11/2012 11:48 AM.

## 2012-01-11 NOTE — Telephone Encounter (Signed)
Pt aware of K results per Dr Shirlee Latch 3.6 on 01/09/12 and cont with K med. Stanton Kidney Rhondalyn Clingan RN 01/11/12 10AM

## 2012-01-12 ENCOUNTER — Telehealth: Payer: Self-pay | Admitting: Licensed Clinical Social Worker

## 2012-01-12 NOTE — Addendum Note (Signed)
Addended by: Cresenciano Genre on: 01/12/2012 11:52 AM   Modules accepted: Orders

## 2012-01-12 NOTE — Telephone Encounter (Signed)
Sandra [name] was referred to CSW for add'l services.  Sandra [name] has hx of CVA.  Pt has left arm contracture and abnl gait.  Sandra [name] would still benefit from physical therapy.  CSW placed call to Ms. Price. Pt states she has had outpatient neuro rehab and exhausted her 30 visits through Medicaid.  Pt is considered home bound and has not had home health services.  CSW will request referral for [facility] PT and refer to [facility], pt in agreement.

## 2012-01-12 NOTE — Addendum Note (Signed)
Addended by: Cresenciano Genre on: 01/12/2012 11:55 AM   Modules accepted: Orders

## 2012-01-13 NOTE — Telephone Encounter (Signed)
Referral faxed to [facility]

## 2012-01-16 ENCOUNTER — Telehealth: Payer: Self-pay | Admitting: *Deleted

## 2012-01-16 ENCOUNTER — Other Ambulatory Visit: Payer: Self-pay | Admitting: *Deleted

## 2012-01-16 MED ORDER — VENLAFAXINE HCL ER 37.5 MG PO CP24: 37.5000 mg | ORAL_CAPSULE | Freq: Every day | ORAL | Status: DC

## 2012-01-16 NOTE — Telephone Encounter (Signed)
Has not had BM past week. Suggest fleets enema for fast results.Stanton Kidney Lequisha Cammack RN 01/16/12 8:45AM

## 2012-01-23 ENCOUNTER — Emergency Department (HOSPITAL_BASED_OUTPATIENT_CLINIC_OR_DEPARTMENT_OTHER): Payer: Medicaid Other

## 2012-01-23 ENCOUNTER — Ambulatory Visit: Payer: Medicaid Other

## 2012-01-23 ENCOUNTER — Encounter (HOSPITAL_BASED_OUTPATIENT_CLINIC_OR_DEPARTMENT_OTHER): Payer: Self-pay | Admitting: *Deleted

## 2012-01-23 ENCOUNTER — Inpatient Hospital Stay (HOSPITAL_BASED_OUTPATIENT_CLINIC_OR_DEPARTMENT_OTHER)
Admission: EM | Admit: 2012-01-23 | Discharge: 2012-01-27 | DRG: 917 | Disposition: A | Discharge status: Home / Self Care | Payer: Medicaid Other | Attending: Internal Medicine | Admitting: Internal Medicine

## 2012-01-23 ENCOUNTER — Telehealth: Payer: Self-pay | Admitting: *Deleted

## 2012-01-23 DIAGNOSIS — F1210 Cannabis abuse, uncomplicated: Secondary | ICD-10-CM | POA: Diagnosis present

## 2012-01-23 DIAGNOSIS — F3289 Other specified depressive episodes: Secondary | ICD-10-CM | POA: Diagnosis present

## 2012-01-23 DIAGNOSIS — G4700 Insomnia, unspecified: Secondary | ICD-10-CM | POA: Diagnosis present

## 2012-01-23 DIAGNOSIS — E785 Hyperlipidemia, unspecified: Secondary | ICD-10-CM | POA: Diagnosis present

## 2012-01-23 DIAGNOSIS — G9340 Encephalopathy, unspecified: Secondary | ICD-10-CM | POA: Diagnosis present

## 2012-01-23 DIAGNOSIS — F329 Major depressive disorder, single episode, unspecified: Secondary | ICD-10-CM | POA: Diagnosis present

## 2012-01-23 DIAGNOSIS — I959 Hypotension, unspecified: Secondary | ICD-10-CM | POA: Diagnosis not present

## 2012-01-23 DIAGNOSIS — N289 Disorder of kidney and ureter, unspecified: Secondary | ICD-10-CM

## 2012-01-23 DIAGNOSIS — T43501A Poisoning by unspecified antipsychotics and neuroleptics, accidental (unintentional), initial encounter: Principal | ICD-10-CM | POA: Diagnosis present

## 2012-01-23 DIAGNOSIS — Z7902 Long term (current) use of antithrombotics/antiplatelets: Secondary | ICD-10-CM

## 2012-01-23 DIAGNOSIS — Z888 Allergy status to other drugs, medicaments and biological substances status: Secondary | ICD-10-CM

## 2012-01-23 DIAGNOSIS — M79609 Pain in unspecified limb: Secondary | ICD-10-CM | POA: Diagnosis present

## 2012-01-23 DIAGNOSIS — Q211 Atrial septal defect: Secondary | ICD-10-CM

## 2012-01-23 DIAGNOSIS — N179 Acute kidney failure, unspecified: Secondary | ICD-10-CM | POA: Diagnosis present

## 2012-01-23 DIAGNOSIS — M129 Arthropathy, unspecified: Secondary | ICD-10-CM | POA: Diagnosis present

## 2012-01-23 DIAGNOSIS — R4182 Altered mental status, unspecified: Secondary | ICD-10-CM

## 2012-01-23 DIAGNOSIS — Z9861 Coronary angioplasty status: Secondary | ICD-10-CM

## 2012-01-23 DIAGNOSIS — I498 Other specified cardiac arrhythmias: Secondary | ICD-10-CM | POA: Diagnosis not present

## 2012-01-23 DIAGNOSIS — I69998 Other sequelae following unspecified cerebrovascular disease: Secondary | ICD-10-CM

## 2012-01-23 DIAGNOSIS — R29898 Other symptoms and signs involving the musculoskeletal system: Secondary | ICD-10-CM | POA: Diagnosis present

## 2012-01-23 DIAGNOSIS — I63511 Cerebral infarction due to unspecified occlusion or stenosis of right middle cerebral artery: Secondary | ICD-10-CM

## 2012-01-23 DIAGNOSIS — I10 Essential (primary) hypertension: Secondary | ICD-10-CM | POA: Diagnosis present

## 2012-01-23 DIAGNOSIS — Q2111 Secundum atrial septal defect: Secondary | ICD-10-CM

## 2012-01-23 DIAGNOSIS — F1910 Other psychoactive substance abuse, uncomplicated: Secondary | ICD-10-CM | POA: Diagnosis present

## 2012-01-23 DIAGNOSIS — F17200 Nicotine dependence, unspecified, uncomplicated: Secondary | ICD-10-CM | POA: Diagnosis present

## 2012-01-23 DIAGNOSIS — T43591A Poisoning by other antipsychotics and neuroleptics, accidental (unintentional), initial encounter: Secondary | ICD-10-CM | POA: Diagnosis present

## 2012-01-23 DIAGNOSIS — E870 Hyperosmolality and hypernatremia: Secondary | ICD-10-CM | POA: Diagnosis not present

## 2012-01-23 DIAGNOSIS — F32A Depression, unspecified: Secondary | ICD-10-CM | POA: Diagnosis present

## 2012-01-23 DIAGNOSIS — I2510 Atherosclerotic heart disease of native coronary artery without angina pectoris: Secondary | ICD-10-CM

## 2012-01-23 DIAGNOSIS — F4489 Other dissociative and conversion disorders: Secondary | ICD-10-CM | POA: Diagnosis present

## 2012-01-23 DIAGNOSIS — L408 Other psoriasis: Secondary | ICD-10-CM | POA: Diagnosis present

## 2012-01-23 DIAGNOSIS — D649 Anemia, unspecified: Secondary | ICD-10-CM | POA: Diagnosis present

## 2012-01-23 DIAGNOSIS — E876 Hypokalemia: Secondary | ICD-10-CM | POA: Diagnosis present

## 2012-01-23 DIAGNOSIS — F411 Generalized anxiety disorder: Secondary | ICD-10-CM | POA: Diagnosis present

## 2012-01-23 DIAGNOSIS — Y92009 Unspecified place in unspecified non-institutional (private) residence as the place of occurrence of the external cause: Secondary | ICD-10-CM

## 2012-01-23 DIAGNOSIS — R7309 Other abnormal glucose: Secondary | ICD-10-CM | POA: Diagnosis present

## 2012-01-23 DIAGNOSIS — F1320 Sedative, hypnotic or anxiolytic dependence, uncomplicated: Secondary | ICD-10-CM | POA: Diagnosis present

## 2012-01-23 DIAGNOSIS — I252 Old myocardial infarction: Secondary | ICD-10-CM

## 2012-01-23 DIAGNOSIS — K219 Gastro-esophageal reflux disease without esophagitis: Secondary | ICD-10-CM | POA: Diagnosis present

## 2012-01-23 DIAGNOSIS — E8809 Other disorders of plasma-protein metabolism, not elsewhere classified: Secondary | ICD-10-CM | POA: Diagnosis not present

## 2012-01-23 DIAGNOSIS — F419 Anxiety disorder, unspecified: Secondary | ICD-10-CM | POA: Diagnosis present

## 2012-01-23 LAB — URINALYSIS, ROUTINE W REFLEX MICROSCOPIC
Glucose, UA: NEGATIVE mg/dL
Ketones, ur: 15 mg/dL — AB
pH: 5 (ref 5.0–8.0)

## 2012-01-23 LAB — BASIC METABOLIC PANEL
Chloride: 108 mEq/L (ref 96–112)
Creatinine, Ser: 1.9 mg/dL — ABNORMAL HIGH (ref 0.50–1.10)
GFR calc Af Amer: 34 mL/min — ABNORMAL LOW (ref >90)
Potassium: 2.7 mEq/L — CL (ref 3.5–5.1)
Sodium: 142 mEq/L (ref 135–145)

## 2012-01-23 LAB — CBC
Platelets: 136 10*3/uL — ABNORMAL LOW (ref 150–400)
RDW: 13.9 % (ref 11.5–15.5)
WBC: 7.8 10*3/uL (ref 4.0–10.5)

## 2012-01-23 LAB — RAPID URINE DRUG SCREEN, HOSP PERFORMED
Benzodiazepines: NOT DETECTED
Cocaine: NOT DETECTED

## 2012-01-23 LAB — ETHANOL: Alcohol, Ethyl (B): <11 mg/dL (ref 0–11)

## 2012-01-23 LAB — URINE MICROSCOPIC-ADD ON

## 2012-01-23 MED ORDER — SODIUM CHLORIDE 0.9 % IV SOLN
INTRAVENOUS | Status: AC
  Administered 2012-01-24: 03:00:00 via INTRAVENOUS

## 2012-01-23 MED ORDER — POTASSIUM CHLORIDE 10 MEQ/100ML IV SOLN
10.0000 meq | Freq: Once | INTRAVENOUS | Status: AC
  Administered 2012-01-23: 10 meq via INTRAVENOUS
  Filled 2012-01-23: qty 100

## 2012-01-23 MED ORDER — SODIUM CHLORIDE 0.9 % IV BOLUS (SEPSIS)
500.0000 mL | Freq: Once | INTRAVENOUS | Status: AC
  Administered 2012-01-23: 500 mL via INTRAVENOUS

## 2012-01-23 MED ORDER — POTASSIUM CHLORIDE CRYS ER 20 MEQ PO TBCR
40.0000 meq | EXTENDED_RELEASE_TABLET | Freq: Once | ORAL | Status: AC
  Administered 2012-01-23: 40 meq via ORAL
  Filled 2012-01-23: qty 2

## 2012-01-23 NOTE — ED Notes (Signed)
Pt. Has had a meal with drink.

## 2012-01-23 NOTE — Telephone Encounter (Signed)
[facility] calls to state pt has a mental status change, she knows her name and the date but is hallucinating greatly even thinking she is smoking cigarettes when she has nothing in her hands, she speaks of things the nurse does not understand, it seems her thought process is completely confused also she has not had a bowel movement in 2+ weeks, she is eating and taking po fluids well but her husband states she is not herself and has been for several days, he also states she got into her medicine several days ago and took too much, she states he says it "was her sleep medicine". The nurse is advised to call 911 and have pt taken to [place] as pt and husband have no transportation, she is agreeable and states she will assist spouse in doing so.

## 2012-01-23 NOTE — ED Provider Notes (Signed)
History  This chart was scribed for Ethelda Chick, MD by Erskine Emery. This patient was seen in room MH12/MH12 and the patient's care was started at 16:34.   CSN: [id number]  Arrival date & time 01/23/12  1625   First MD Initiated Contact with Patient 01/23/12 1634      Chief Complaint  Patient presents with  . Depression    (Consider location/radiation/quality/duration/timing/severity/associated sxs/prior treatment) HPI Sandra [name] is a 51 y.o. female brought in by ambulance, who presents to the Emergency Department with confusion and hallucinations for the last 4 days. Pt reports she is addicted to a few medications, including Klonopin and bacolfan. Pt reports she ran out of her medications so she took her husband's zyprexa 3-4 days ago, after which she began to get confused, have hallucinations, and fell multiple times. Pt denies any suicidal intent, head injury, or LOC, but reports some mild left shoulder and back pain from the falls. Pt's husband reports since she ceased taking the zyprexa yesterday she seems back to baseline, but the home health nurse that saw her this morning recommended that she come in to the ED. Pt has no prior similar episodes but reports a h/o MI and stroke. Pt thinks she has been admitted to [facility] within the last couple days but her husband denies that she has.   Past Medical History  Diagnosis Date  . PFO (patent foramen ovale) August 2012    PFO seen on TEE during hospitalization in 01/2011.  Patient to f/u with Dr. Pearlean Brownie, neurology, for enrollment in trial for medical treatment of PFO  . CAD (coronary artery disease) 2007    istory of MI with stent in 2007 by Dr. Park Breed, [facility]. Stent placement by Dr Karoline Caldwell   . Prediabetes 03/07/2011  . Hypertension 03/07/2011  . Hyperlipidemia 03/07/2011  . Tobacco abuse 03/07/2011  . Depression 03/07/2011  . Psoriasis 03/07/2011  . Acute right MCA stroke 01/27/11    Acute R MCA stroked  with L arm and leg  weakness  . Anxiety 1980s    On Klonipin since age 32. Had addiction problem with Xanax which  Was therefore d/c . Seen in the past at [facility].   . Fractured toe 01/2011    History of left fifth toe proximal phalanx fracture when patient had a stroke (01/2011) and fell. Seen by Dr Victorino Dike.   . Incontinence 04/14/2011  . Arthritis 04/16/2011  . Myocardial infarct, old   . Tobacco abuse 03/07/2011    Quit 01/2011   . Fracture of fifth toe, left, closed 01/28/2011    History of left fifth toe proximal phalanx fracture when patient had a stroke (01/2011) and fell. Seen by Dr Victorino Dike.    Marland Kitchen GERD (gastroesophageal reflux disease)   . Substance abuse     h/o narcotic abuse pt denies as of 01/09/12    Past Surgical History  Procedure Date  . Cardiac stent     2008    No family history on file.  History  Substance Use Topics  . Smoking status: Current Everyday Smoker -- 2.0 packs/day    Types: Cigarettes    Last Attempt to Quit: 01/26/2011  . Smokeless tobacco: Not on file   Comment: restarted  . Alcohol Use: No    OB History    Grav Para Term Preterm Abortions TAB SAB Ect Mult Living                  Review of  Systems  Constitutional: Negative for fever.  HENT: Negative for voice change.   Eyes: Negative for discharge.  Respiratory: Negative for cough.   Cardiovascular: Negative for chest pain.  Gastrointestinal: Negative for abdominal pain and diarrhea.  Genitourinary: Negative for flank pain.  Musculoskeletal: Positive for back pain.       Mild right shoulder pain  Skin: Negative for rash.  Neurological: Negative for seizures and headaches.  Hematological: Negative.   Psychiatric/Behavioral: Positive for hallucinations, confusion and disturbed wake/sleep cycle. Negative for self-injury.  ROS reviewed and all otherwise negative except for mentioned in HPI    Allergies  Azor  Home Medications   No current outpatient prescriptions on file.  Triage Vitals: BP  100/49  Pulse 80  Temp 99 F (37.2 C) (Oral)  Resp 18  Ht 5\' 5"  (1.651 m)  Wt 188 lb (85.276 kg)  BMI 31.28 kg/m2  SpO2 94%  Physical Exam  Nursing note and vitals reviewed. Constitutional: She appears well-developed and well-nourished. No distress.  HENT:  Head: Normocephalic and atraumatic.  Eyes: EOM are normal.  Neck: Neck supple. No tracheal deviation present.  Cardiovascular: Normal rate.   Pulmonary/Chest: Effort normal. No respiratory distress.  Abdominal: Soft. She exhibits no distension.  Musculoskeletal: Normal range of motion. She exhibits no edema.  Neurological: She is alert.  Skin: Skin is warm and dry.  Psychiatric: She has a normal mood and affect. She expresses no suicidal ideation. She expresses no suicidal plans.       Confused  Note- cranial nerves 2-12 grossly intact, strength 5/5 in extremities x 4, sensation intact.  Pt alert and oriented to person- not to place or time.  Lungs- CTAB, CR- RRR, no murmur/gallop/rub, brisk cap refill  ED Course  Procedures (including critical care time) DIAGNOSTIC STUDIES: Oxygen Saturation is 94% on room air, adequate by my interpretation.    COORDINATION OF CARE: 17:30--I evaluated the patient and we discussed a treatment plan including blood work and urinalysis to which the pt agreed. I told the pt not to take medications that are not prescribed to her anymore.   8:11 PM d/w [facility] resident, Dr. Selinda Eon pt to be transferred to [facility] to telemetry bed   Labs Reviewed  CBC - Abnormal; Notable for the following:    RBC 3.46 (*)     Hemoglobin 10.8 (*)     HCT 31.5 (*)     Platelets 136 (*)     All other components within normal limits  BASIC METABOLIC PANEL - Abnormal; Notable for the following:    Potassium 2.7 (*)     Glucose, Bld 111 (*)     BUN 29 (*)     Creatinine, Ser 1.90 (*)     GFR calc non Af Amer 30 (*)     GFR calc Af Amer 34 (*)     All other components within normal limits  URINE RAPID DRUG SCREEN  (HOSP PERFORMED) - Abnormal; Notable for the following:    Tetrahydrocannabinol POSITIVE (*)     All other components within normal limits  URINALYSIS, ROUTINE W REFLEX MICROSCOPIC - Abnormal; Notable for the following:    Color, Urine AMBER (*)  BIOCHEMICALS MAY BE AFFECTED BY COLOR   APPearance CLOUDY (*)     Bilirubin Urine MODERATE (*)     Ketones, ur 15 (*)     Leukocytes, UA SMALL (*)     All other components within normal limits  URINE MICROSCOPIC-ADD ON - Abnormal; Notable for  the following:    Squamous Epithelial / LPF FEW (*)     Bacteria, UA FEW (*)     Casts HYALINE CASTS (*)     All other components within normal limits  GLUCOSE, CAPILLARY - Abnormal; Notable for the following:    Glucose-Capillary 126 (*)     All other components within normal limits  COMPREHENSIVE METABOLIC PANEL - Abnormal; Notable for the following:    Sodium 147 (*)     Potassium 3.1 (*)     Chloride 114 (*)     Glucose, Bld 121 (*)     BUN 24 (*)     Creatinine, Ser 1.21 (*)     Albumin 3.4 (*)     GFR calc non Af Amer 51 (*)     GFR calc Af Amer 59 (*)     All other components within normal limits  HEMOGLOBIN A1C - Abnormal; Notable for the following:    Hemoglobin A1C 5.9 (*)     Mean Plasma Glucose 123 (*)     All other components within normal limits  COMPREHENSIVE METABOLIC PANEL - Abnormal; Notable for the following:    Chloride 114 (*)     Glucose, Bld 114 (*)     GFR calc non Af Amer 70 (*)     GFR calc Af Amer 82 (*)     All other components within normal limits  ETHANOL  MAGNESIUM  AMMONIA  SODIUM, URINE, RANDOM  CREATININE, URINE, RANDOM  UREA NITROGEN, URINE  CBC  BASIC METABOLIC PANEL   Ct Head Wo Contrast  01/23/2012  *RADIOLOGY REPORT*  Clinical Data: Altered level of consciousness.  CT HEAD WITHOUT CONTRAST  Technique:  Contiguous axial images were obtained from the base of the skull through the vertex without contrast.  Comparison: Head CT 01/27/2011.  Findings:  Remote right middle cerebral artery territory infarcts are demonstrated.  No acute hemispheric infarction or intracranial hemorrhage.  The ventricles are normal except for mild ex vacuo dilatation of the right lateral ventricle.  No extra-axial fluid collections.  No mass lesions.  The brainstem and cerebellum grossly normal and stable.  The bony structures are intact.  The paranasal sinuses and mastoid air cells are clear.  IMPRESSION:  Remote infarcts but no acute intracranial process or mass lesion.  Original Report Authenticated By: P. Loralie Champagne, M.D.     1. Altered mental status 2. Renal insufficiency 3. hypokalemia    MDM  Pt with multiple medical problems presenting with altered mental status.  She and her husband endorse that she took his zyprexa over the weekend- last dose 2 days ago.  Husband states she was hallucinating and confused but that she has improved overall.  On exam today no focal neuro deficits but patient with confusion on exam.  Found to have renal insufficiency, UDS+ for marijuana, hypokalemia.  No acute findings on head CT.  Pt admitted to [facility] service for further evaluation and management.  Suspect benzo withdrawal as well as renal insufficiency contributing to her change in mental status.  In ED treated with IV hydration, po and IV potassium.      I personally performed the services described in this documentation, which was scribed in my presence. The recorded information has been reviewed and considered.    Ethelda Chick, MD 01/24/12 Ebony Cargo

## 2012-01-23 NOTE — ED Notes (Signed)
Pt. Has no complaints of pain and is in no distress.  Pt. Is talking non stop and at times the conversation she has makes no sense.

## 2012-01-23 NOTE — ED Notes (Signed)
Report was given to [facility] on 6700 at [facility].  Report was also given to [facility] with [facility].

## 2012-01-23 NOTE — Telephone Encounter (Signed)
Agree with 911. Thanks

## 2012-01-23 NOTE — ED Notes (Signed)
Husband at bedside of Pt. Reports the Pt. Took his zyprexa "the other night to help her sleep" and since then she has been "kinda off in her head having hallucinations.

## 2012-01-23 NOTE — ED Notes (Signed)
EMS reports patient took 6 of her husband's zyprexa 4 days ago to sleep.  States home health nurse saw patient today and felt she had a altered loc.  EMS states husband feels like the patient has had some left sided weakness for the last 4 days.  Patient is awake, alert and oriented and states she is depressed.

## 2012-01-24 DIAGNOSIS — N289 Disorder of kidney and ureter, unspecified: Secondary | ICD-10-CM

## 2012-01-24 DIAGNOSIS — F4489 Other dissociative and conversion disorders: Secondary | ICD-10-CM | POA: Diagnosis present

## 2012-01-24 DIAGNOSIS — N179 Acute kidney failure, unspecified: Secondary | ICD-10-CM | POA: Diagnosis present

## 2012-01-24 LAB — COMPREHENSIVE METABOLIC PANEL
ALT: 9 U/L (ref 0–35)
ALT: 12 U/L (ref 0–35)
AST: 13 U/L (ref 0–37)
Albumin: 3.4 g/dL — ABNORMAL LOW (ref 3.5–5.2)
Albumin: 3.6 g/dL (ref 3.5–5.2)
Alkaline Phosphatase: 73 U/L (ref 39–117)
BUN: 19 mg/dL (ref 6–23)
CO2: 20 mEq/L (ref 19–32)
Calcium: 8.5 mg/dL (ref 8.4–10.5)
Calcium: 9.2 mg/dL (ref 8.4–10.5)
Chloride: 114 mEq/L — ABNORMAL HIGH (ref 96–112)
GFR calc Af Amer: 82 mL/min — ABNORMAL LOW (ref >90)
GFR calc non Af Amer: 51 mL/min — ABNORMAL LOW (ref >90)
Glucose, Bld: 114 mg/dL — ABNORMAL HIGH (ref 70–99)
Potassium: 3.6 mEq/L (ref 3.5–5.1)
Sodium: 147 mEq/L — ABNORMAL HIGH (ref 135–145)
Sodium: 144 mEq/L (ref 135–145)
Total Bilirubin: 0.5 mg/dL (ref 0.3–1.2)
Total Protein: 6.7 g/dL (ref 6.0–8.3)

## 2012-01-24 LAB — GLUCOSE, CAPILLARY

## 2012-01-24 LAB — MAGNESIUM: Magnesium: 2.2 mg/dL (ref 1.5–2.5)

## 2012-01-24 MED ORDER — AMLODIPINE BESYLATE 5 MG PO TABS
5.0000 mg | ORAL_TABLET | Freq: Every day | ORAL | Status: DC
  Administered 2012-01-24 – 2012-01-25 (×2): 5 mg via ORAL
  Filled 2012-01-24 (×4): qty 1

## 2012-01-24 MED ORDER — CLOPIDOGREL BISULFATE 75 MG PO TABS
75.0000 mg | ORAL_TABLET | Freq: Every day | ORAL | Status: DC
  Administered 2012-01-24 – 2012-01-27 (×4): 75 mg via ORAL
  Filled 2012-01-24 (×7): qty 1

## 2012-01-24 MED ORDER — METOPROLOL TARTRATE 25 MG PO TABS
25.0000 mg | ORAL_TABLET | Freq: Two times a day (BID) | ORAL | Status: DC
  Administered 2012-01-24 – 2012-01-25 (×5): 25 mg via ORAL
  Filled 2012-01-24 (×7): qty 1

## 2012-01-24 MED ORDER — BACLOFEN 5 MG HALF TABLET
5.0000 mg | ORAL_TABLET | Freq: Three times a day (TID) | ORAL | Status: DC
  Administered 2012-01-24 – 2012-01-25 (×5): 5 mg via ORAL
  Filled 2012-01-24 (×8): qty 1

## 2012-01-24 MED ORDER — LORAZEPAM 2 MG/ML IJ SOLN
1.0000 mg | Freq: Once | INTRAMUSCULAR | Status: AC
  Administered 2012-01-24: 1 mg via INTRAVENOUS
  Filled 2012-01-24: qty 1

## 2012-01-24 MED ORDER — CLONAZEPAM 1 MG PO TABS
1.0000 mg | ORAL_TABLET | Freq: Two times a day (BID) | ORAL | Status: DC
  Administered 2012-01-24 – 2012-01-25 (×3): 1 mg via ORAL
  Filled 2012-01-24 (×3): qty 1

## 2012-01-24 MED ORDER — SODIUM CHLORIDE 0.45 % IV SOLN
INTRAVENOUS | Status: DC
  Administered 2012-01-24 – 2012-01-25 (×2): via INTRAVENOUS
  Filled 2012-01-24 (×3): qty 1000

## 2012-01-24 MED ORDER — ENOXAPARIN SODIUM 40 MG/0.4ML ~~LOC~~ SOLN
40.0000 mg | SUBCUTANEOUS | Status: DC
  Administered 2012-01-24 – 2012-01-27 (×4): 40 mg via SUBCUTANEOUS
  Filled 2012-01-24 (×4): qty 0.4

## 2012-01-24 MED ORDER — SODIUM CHLORIDE 0.45 % IV SOLN
INTRAVENOUS | Status: DC
  Administered 2012-01-24: 11:00:00 via INTRAVENOUS

## 2012-01-24 MED ORDER — SIMVASTATIN 20 MG PO TABS
20.0000 mg | ORAL_TABLET | Freq: Every day | ORAL | Status: DC
  Administered 2012-01-24 – 2012-01-26 (×3): 20 mg via ORAL
  Filled 2012-01-24 (×4): qty 1

## 2012-01-24 MED ORDER — POTASSIUM CHLORIDE CRYS ER 20 MEQ PO TBCR
40.0000 meq | EXTENDED_RELEASE_TABLET | ORAL | Status: AC
  Administered 2012-01-24 (×2): 40 meq via ORAL
  Filled 2012-01-24 (×2): qty 2

## 2012-01-24 NOTE — Progress Notes (Signed)
Pt is getting progressively more agitated and doctor is aware. Please continue to update doctor to patients state.

## 2012-01-24 NOTE — Care Management Note (Signed)
    Page 1 of 1   01/24/2012     2:22:18 PM   CARE MANAGEMENT NOTE 01/24/2012  Patient:  Sandra [name], Sandra [name]   Account Number:  [id number]  Date Initiated:  01/24/2012  Documentation initiated by:  Theseus Birnie  Subjective/Objective Assessment:   pt is 51 yr old admitted with altered mental status     Action/Plan:   Continue to follow for CM/discharge planning needs   Anticipated DC Date:  01/27/2012   Anticipated DC Plan:  [place]  CM consult      Choice offered to / List presented to:             Status of service:  In process, will continue to follow Medicare Important Message given?   (If response is "NO", the following Medicare IM given date fields will be blank) Date Medicare IM given:   Date Additional Medicare IM given:    Discharge Disposition:    Per UR Regulation:  Reviewed for med. necessity/level of care/duration of stay  If discussed at [place] of Stay Meetings, dates discussed:    Comments:

## 2012-01-24 NOTE — Progress Notes (Signed)
Pt arrived to the floor via stretcher accompanied by [employer] staff. Admission assessment and history completed as able with pt confusion. No complaints of pain or shortness of breath at this time. Oriented pt to the room and explained the need to call nursing staff prior to getting out of bed. Explained to the pt that this was a non smoking facility and that smoking anywhere on campus was prohibited. Pt disoriented and expressed desire to hurt self or others. Charge Nurse notified and suicide sitter assigned.

## 2012-01-24 NOTE — Progress Notes (Signed)
Subjective:  Patient is a 51 yo female with a history of anxiety/depression, CAD, CVA 2012, HTN, HLD. She presents with altered mental status 1 week after running out of her medication. Her husband and niece are unsure of all the names except for Klonopin, which they say she has been taking more than the prescribed amount. She started acting abnormal the day of admission when she started talking to people who were not there, was jittery, confusing her husband with other people, and making up stories. Husband admits she uses marijuana on a daily basis (1-2 blunts per day) but denies any alcohol use, or other drugs. Husband also states that she has been unable to sleep the past few nights and took some of his pills while she was searching for a sleep aid. He is not sure what pills she took but he says the only medications he is on are zyprexa and antihypertensives.  She denies headache, fever, chills, dysuria, chest pain, shortness of breath. She does have some L shoulder pain, which is not new (previously imaged as outpatient). Says she has not been taking medications, but no other valuable history can be gathered from patient. She is telling stories and doesn't appear to know where she is or who she is with.   Objective: Vital signs in last 24 hours: Filed Vitals:   01/23/12 2348 01/24/12 0525 01/24/12 0900 01/24/12 1407  BP: 130/61 98/46 110/68 110/65  Pulse: 67 59 70 59  Temp: 98.9 F (37.2 C) 98.9 F (37.2 C) 98 F (36.7 C) 98.3 F (36.8 C)  TempSrc: Oral Oral Oral Oral  Resp: 20 22 19 16   Height: 5\' 5"  (1.651 m)     Weight: 188 lb 11.2 oz (85.594 kg)     SpO2: 100% 100% 99% 99%   Weight change:   Intake/Output Summary (Last 24 hours) at 01/24/12 1553 Last data filed at 01/24/12 0647  Gross per 24 hour  Intake 522.92 ml  Output    100 ml  Net 422.92 ml   Physical Exam Blood pressure 110/65, pulse 59, temperature 98.3 F (36.8 C), temperature source Oral, resp. rate 16, height 5'  5" (1.651 m), weight 188 lb 11.2 oz (85.594 kg), SpO2 99.00%. General: vitals reviewed. Patient is fidgeting with her hands, picking at bed sheets, undressing herself oriented to person only  HEENT:  PERRL, EOMI, no lymphadenopathy, moist mucous slightly dry Cardiovascular:  Regular rate and rhythm, no murmurs, rubs or gallops Respiratory:  Clear to auscultation bilaterally, no wheezes, rales, or rhonchi Abdomen:  Soft, nondistended, nontender, normoactive bowel sounds Extremities:  UE with spastic rigidity, hyperreflexic b/l, symmetric. Strength 2/5. Contractures of hand b/l. Warm and well-perfused, no clubbing, cyanosis, or edema.  Skin: Warm, dry, multiple scabs and old scars on LE Neuro: Patient appears very anxious and jittery, tremors noted b/l UE, says she is at her neighbors house "going through groceries" and starts counting numbers when asked what day it is. She does know who the president is, however. Pressured speech and tangential thought processes. She introduces me to her husband and says he is her son and says the sitter is her financial advisor who helped her get her bills straight. Patient answers some questions fluently but her answers are fictional. Cranial nerves in tact. Sensation in tact. Gait testing deferred. Pt noncompliant with rest of neuro exam.   Lab Results: Basic Metabolic Panel:  Lab 01/24/12 1610 01/23/12 1749  NA 147* 142  K 3.1* 2.7*  CL 114* 108  CO2 20 21  GLUCOSE 121* 111*  BUN 24* 29*  CREATININE 1.21* 1.90*  CALCIUM 8.5 8.9  MG 2.2 --  PHOS -- --   Liver Function Tests:  Lab 01/24/12 0210  AST 13  ALT 9  ALKPHOS 67  BILITOT 0.5  PROT 6.1  ALBUMIN 3.4*   No results found for this basename: LIPASE:2,AMYLASE:2 in the last 168 hours  Lab 01/24/12 0211  AMMONIA 20   CBC:  Lab 01/23/12 1749  WBC 7.8  NEUTROABS --  HGB 10.8*  HCT 31.5*  MCV 91.0  PLT 136*   Cardiac Enzymes: No results found for this basename:  CKTOTAL:3,CKMB:3,CKMBINDEX:3,TROPONINI:3 in the last 168 hours BNP: No results found for this basename: PROBNP:3 in the last 168 hours D-Dimer: No results found for this basename: DDIMER:2 in the last 168 hours CBG:  Lab 01/23/12 2356  GLUCAP 126*   Hemoglobin A1C:  Lab 01/24/12 0210  HGBA1C 5.9*   Fasting Lipid Panel: No results found for this basename: CHOL,HDL,LDLCALC,TRIG,CHOLHDL,LDLDIRECT in the last 161 hours Thyroid Function Tests: No results found for this basename: TSH,T4TOTAL,FREET4,T3FREE,THYROIDAB in the last 168 hours Coagulation: No results found for this basename: LABPROT:4,INR:4 in the last 168 hours Anemia Panel: No results found for this basename: VITAMINB12,FOLATE,FERRITIN,TIBC,IRON,RETICCTPCT in the last 168 hours Urine Drug Screen: Drugs of Abuse     Component Value Date/Time   LABOPIA NONE DETECTED 01/23/2012 1843   COCAINSCRNUR NONE DETECTED 01/23/2012 1843   LABBENZ NONE DETECTED 01/23/2012 1843   AMPHETMU NONE DETECTED 01/23/2012 1843   THCU POSITIVE* 01/23/2012 1843   LABBARB NONE DETECTED 01/23/2012 1843    Alcohol Level:  Lab 01/23/12 1749  ETH <11   Urinalysis:  Lab 01/23/12 1843  COLORURINE AMBER*  LABSPEC 1.023  PHURINE 5.0  GLUCOSEU NEGATIVE  HGBUR NEGATIVE  BILIRUBINUR MODERATE*  KETONESUR 15*  PROTEINUR NEGATIVE  UROBILINOGEN 1.0  NITRITE NEGATIVE  LEUKOCYTESUR SMALL*    Micro Results: No results found for this or any previous visit (from the past 240 hour(s)). Studies/Results: Ct Head Wo Contrast  01/23/2012  *RADIOLOGY REPORT*  Clinical Data: Altered level of consciousness.  CT HEAD WITHOUT CONTRAST  Technique:  Contiguous axial images were obtained from the base of the skull through the vertex without contrast.  Comparison: Head CT 01/27/2011.  Findings: Remote right middle cerebral artery territory infarcts are demonstrated.  No acute hemispheric infarction or intracranial hemorrhage.  The ventricles are normal except for mild ex  vacuo dilatation of the right lateral ventricle.  No extra-axial fluid collections.  No mass lesions.  The brainstem and cerebellum grossly normal and stable.  The bony structures are intact.  The paranasal sinuses and mastoid air cells are clear.  IMPRESSION:  Remote infarcts but no acute intracranial process or mass lesion.  Original Report Authenticated By: P. Loralie Champagne, M.D.   Medications:  Scheduled Meds:    . sodium chloride   Intravenous STAT  . amLODipine  5 mg Oral Daily  . clonazePAM  1 mg Oral BID  . clopidogrel  75 mg Oral Q breakfast  . enoxaparin (LOVENOX) injection  40 mg Subcutaneous Q24H  . LORazepam  1 mg Intravenous Once  . metoprolol  25 mg Oral BID  . potassium chloride  10 mEq Intravenous Once  . potassium chloride  40 mEq Oral Once  . potassium chloride  40 mEq Oral Q4H  . simvastatin  20 mg Oral q1800  . sodium chloride  500 mL Intravenous Once   Continuous Infusions:    .  sodium chloride 125 mL/hr at 01/24/12 1040   PRN Meds:. Assessment/Plan:  Confusion/AMS - patient continues to appear extremely anxious and fidgety, intermittently agitated, responds to absent stimuli -suspect benzodiazepine withdrawal as patient has been off home klonopin, she had been taking more than her prescribed dose according to family -Differential includes: acute discontinuation of baclofen vs. Other ingestion of husband's medication, consider metabolic derangement as possible etiology, doubt stoke at patient is without new focal neurological deficits and no evidence on CT, UDS positive for THC -ammonia WNL, no white count to suggest infectious etiology, blood ETOH level neg  -will give ativan and restart home benzo, baclofen and continue to monitor -f/u on pending tests, cont neuro exams -contsitter at bedside and pt is fall risk  Hypokalemia: pt with K 2.7 on admission. Up to 3.1 this morning -will replete K and Mag as needed and recheck frequently -etiology likely  related to patient's noncompliance with home potassium supplementation, other causes could be ?nutrition status vs GI losses vs renal loss -cont to hold HCTZ  Hypernatremia - Na 147 this morning, patient appears dehydrated -will give 1/2 NS at 125cc/hr to replete volume with goal correction <.11meq/l/hr -recheck Na frequently  Hypoalbuminemia -albumin 3.4 -ketones in urine, may be malnourished. Denies etoh use -will advance diet and monitor  Acute Kidney Injury - Cr 1.9 on admission, down to 1.2, baseline <.8 -unclear etiology, BUN: Cr ratio is ~20 -will hydrate with 1/2 NS and cont to monitor -f/u FENA  Normocytic Anemia -etiology unclear -no evidence of active bleeding -will monitor H/H  History of stroke -cont Plavix  CAD -cont home statin, BB  PPX -lovenox  Dispo -patient currently altered, not able to go home at this time    LOS: 1 day   Denton Ar 01/24/2012, 3:53 PM

## 2012-01-24 NOTE — H&P (Signed)
Hospital Admission Note Date: 01/24/2012  Patient name: Sandra [name] Medical record number: [id number] Date of birth: 1960-09-06 Age: 51 y.o. Gender: female PCP: Almyra Deforest, MD  Medical Service: Internal Medicine Teaching Program   Attending physician:  Dr Jonah Blue     1st Contact: Dr Denton Ar    Pager: [phone] 2nd Contact: Dr Elyse Jarvis    Pager: [phone]  After 5 pm or weekends: 1st Contact:      Pager: [phone] 2nd Contact:      Pager: [phone]  Chief Complaint: Confusion/AMS  History of Present Illness: Thorough HPI difficult to obtain from patient secondary confusional state.  The patient presented to the [facility] ED by ambulance, with complaints of confusion and hallucinations for the last 4 days. Patient states that she is "addicted" to baclofen and clonazepam, and that she does not feel well if she misses doses or does not take these medications regularly. She states that she ran out of her medications recently, and subsequently took her husband's zyprexa approximately 4 days ago, after which she developed confusion, hallucinations, and difficulty ambulating secondary to loss of balance.She denies any loss of consciousness associated with her falls (or any other LOC in the last 4 days), and complains that her falls have resulted in her having some mild back and left shoulder pain.  Patient's husband was not present at the time of admission, but reported in the ED that the patient had ceased taking the zyprexa yesterday, that she was subsequently back to her baseline, but that the home health nurse that saw her this morning recommended that she come in to the ED. The patient has a history of prior MI and prior right MCA stroke, but has never experienced a confusional state such as the one she presents with currently. Although the patient thinks she has been admitted to [facility] within the last couple days, her husband denied this to the ED physician.   Patient  states that she smokes marijuana regularly, but denies any other illicit drug use. Denies use of PCP or cannabinoid analogues. Patient denies any current visual or auditory hallucinations.  Meds: Amlodipine 5mg /day Baclofen 10mg  tid Clonazepam 1mg  bid Famotidine 20mg  bid Hydrocodone-acetaminophen 5-500mg  q6 PRN Lisinopril-HCTZ 20-12.5mg  bid Metoprolol 50mg  tid Potassium chloride tablet/day solifenacin (vesicare) 5mg /day  Allergies: Allergies as of 01/23/2012 - Review Complete 01/23/2012  Allergen Reaction Noted  . Azor (amlodipine-olmesartan) Other (See Comments) 01/27/2011   Past Medical History  Diagnosis Date  . PFO (patent foramen ovale) August 2012    PFO seen on TEE during hospitalization in 01/2011.  Patient to f/u with Dr. Pearlean Brownie, neurology, for enrollment in trial for medical treatment of PFO  . CAD (coronary artery disease) 2007    istory of MI with stent in 2007 by Dr. Park Breed, [facility]. Stent placement by Dr Karoline Caldwell   . Prediabetes 03/07/2011  . Hypertension 03/07/2011  . Hyperlipidemia 03/07/2011  . Tobacco abuse 03/07/2011  . Depression 03/07/2011  . Psoriasis 03/07/2011  . Acute right MCA stroke 01/27/11    Acute R MCA stroked  with L arm and leg weakness  . Anxiety 1980s    On Klonipin since age 71. Had addiction problem with Xanax which  Was therefore d/c . Seen in the past at [facility].   . Fractured toe 01/2011    History of left fifth toe proximal phalanx fracture when patient had a stroke (01/2011) and fell. Seen by Dr Victorino Dike.   . Incontinence 04/14/2011  .  Arthritis 04/16/2011  . Myocardial infarct, old   . Tobacco abuse 03/07/2011    Quit 01/2011   . Fracture of fifth toe, left, closed 01/28/2011    History of left fifth toe proximal phalanx fracture when patient had a stroke (01/2011) and fell. Seen by Dr Victorino Dike.    Marland Kitchen GERD (gastroesophageal reflux disease)   . Substance abuse     h/o narcotic abuse pt denies as of 01/09/12   Past Surgical  History  Procedure Date  . Cardiac stent     2008   No family history on file. History   Social History  . Marital Status: Married    Spouse Name: N/A    Number of Children: N/A  . Years of Education: N/A   Occupational History  . Not on file.   Social History Main Topics  . Smoking status: Current Everyday Smoker -- 2.0 packs/day    Types: Cigarettes    Last Attempt to Quit: 01/26/2011  . Smokeless tobacco: Not on file   Comment: restarted  . Alcohol Use: No  . Drug Use: No  . Sexually Active: Yes   Other Topics Concern  . Not on file   Social History Narrative  . No narrative on file   Review of Systems: Per HPI.  Physical Exam: Blood pressure 130/61, pulse 67, temperature 98.9 F (37.2 C), temperature source Oral, resp. rate 20, height 5\' 5"  (1.651 m), weight 188 lb 11.2 oz (85.594 kg), SpO2 100.00%. Constitutional: Vital signs reviewed.  Patient is a well-developed and well-nourished woman in mild distress secondary to confusion and disorientation. Mildly diaphoretic. Head: Normocephalic and atraumatic Mouth: no erythema or exudates, MMM Eyes: PERRL, mild mydriasis, EOMI, conjunctivae normal, No scleral icterus.  Neck: Supple, Trachea midline normal ROM Cardiovascular: RRR, S1 normal, S2 normal, no MRG, pulses symmetric and intact bilaterally Pulmonary/Chest: CTAB, no wheezes, rales, or rhonchi Abdominal: Soft. Non-tender, non-distended, bowel sounds are normal Musculoskeletal: No joint deformities, erythema, or stiffness, ROM full and no nontender Hematology: no cervical, inginal, or axillary adenopathy.  Neurological: Oriented x 1, 1/5 in LUE (chronic following stroke), 5/5 in RUE, RLE, LLE; hyperreflexia present; no sensory deficits. Significant resting tremor appreciated in RUE, 3-4hz . Tremor throughout cerebellar finger-to-nose testing, but no clear intention tremor appreciated Skin: Ecchymoses  appreciated on mid-tibial area of LLE.  Psychiatric: Patient  is having paranoid thoughts of people wanting to harm her, but no specific or well-constructed delusions relevant to this finding. Patient denies hallucinations, but is noted to act as if she is smoking a cigarette during exam, and has also raised her hand to her mouth in attempts to take pills that were not present.   Lab results: Basic Metabolic Panel:  [facility] 01/23/12 1749  NA 142  K 2.7*  CL 108  CO2 21  GLUCOSE 111*  BUN 29*  CREATININE 1.90*  CALCIUM 8.9  MG --  PHOS --   CBC:  Basename 01/23/12 1749  WBC 7.8  NEUTROABS --  HGB 10.8*  HCT 31.5*  MCV 91.0  PLT 136*   CBG:  Basename 01/23/12 2356  GLUCAP 126*   Thyroid Function Tests: Lab Results  Component Value Date   TSH 0.336* 01/09/2012  free T4 0.92   Urine Drug Screen: Drugs of Abuse     Component Value Date/Time   LABOPIA NONE DETECTED 01/23/2012 1843   COCAINSCRNUR NONE DETECTED 01/23/2012 1843   LABBENZ NONE DETECTED 01/23/2012 1843   AMPHETMU NONE DETECTED 01/23/2012 1843   THCU  POSITIVE* 01/23/2012 1843   LABBARB NONE DETECTED 01/23/2012 1843    Alcohol Level:  Basename 01/23/12 1749  ETH <11   Urinalysis:  Basename 01/23/12 1843  COLORURINE AMBER*  LABSPEC 1.023  PHURINE 5.0  GLUCOSEU NEGATIVE  HGBUR NEGATIVE  BILIRUBINUR MODERATE*  KETONESUR 15*  PROTEINUR NEGATIVE  UROBILINOGEN 1.0  NITRITE NEGATIVE  LEUKOCYTESUR SMALL*   Imaging results:  Ct Head Wo Contrast  01/23/2012  *RADIOLOGY REPORT*  Clinical Data: Altered level of consciousness.  CT HEAD WITHOUT CONTRAST  Technique:  Contiguous axial images were obtained from the base of the skull through the vertex without contrast.  Comparison: Head CT 01/27/2011.  Findings: Remote right middle cerebral artery territory infarcts are demonstrated.  No acute hemispheric infarction or intracranial hemorrhage.  The ventricles are normal except for mild ex vacuo dilatation of the right lateral ventricle.  No extra-axial fluid collections.  No mass  lesions.  The brainstem and cerebellum grossly normal and stable.  The bony structures are intact.  The paranasal sinuses and mastoid air cells are clear.  IMPRESSION:  Remote infarcts but no acute intracranial process or mass lesion.  Original Report Authenticated By: P. Loralie Champagne, M.D.   Assessment & Plan by Problem: Patient is a 51 y.o. F with PMH of substance abuse, depression, previous right MCA stroke, who presents with confusional state x 4 days, following taking her husband's Zyprexa.  Altered mental status - Patient's AMS is characterized predominantly by confusion and delusional/paranoid thinking patterns. Patient does not have signs of delirium, as there is no waxing/waning levels of consciousness, and she is able to carry on a conversation. CT head was negative for hemorrhage or other acute pathology. Vitals are within normal limits. CBG = 126. UA is negative for nitrites, and patient does not complain of dysuria. UDS was positive for THC, but negative for other drugs of abuse. As the patient has been taking medications not as directed (i.e. has been taking her husband's zyprexa), it is possible that this practice has contributed to her current altered mental status. Adverse interactions between zyprexa and vesicare exist, wherein anticholinergic effects are worsened by zyprexa. Abrupt discontinuation of baclofen and/or clonopin could also result in signs/symptoms consistent with what the patient presents with. Although she does not state that she has discontinued either of these medications, compliance with both is unclear over the last 4 days, particularly as she believes that the combination of baclofen and zyprexa was what precipitated her confusional state. Interventions at this time will be directed towards further investigating the underlying etiology of the patient's confusional state, and towards keeping the patient safe while altered. - admit to floor - monitor vitals - 24-hr  sitter/strict bed rest, as patient is a fall-risk at this time - check ammonia - check A1c - check EKG  AKI - Cr on admission = 1.90. Baseline <0.8. Etiology unclear at this time, as patient is not able to provide a coherent history, although she does seem to endorse that she has not been eating or drinking regularly over the last few days. BUN/Cr ratio not consistent with prerenal azotemia, however, and patient will thus benefit from further investigation into the etiology while fluids are being repleted.  - IV normal saline - urine Na+, urine Cr, FeNa, FeUrea - holding lisinopril/HCTZ  Hypokalemia - K+ on admission = 2.7. Patient has a history of recurrent hypokalemia. Patient has received KCl PO and KCl IV at time of admission. - continue to replete with IV  and PO KCl as necessary - Check Mg2+ - Daily BMETs - holding HCTZ  Signed: Lavena Bullion, Maleik Vanderzee MD  01/24/2012, 1:58 AM

## 2012-01-24 NOTE — H&P (Signed)
INTERNAL MEDICINE TEACHING SERVICE Attending Admission Note  Date: 01/24/2012  Patient name: Sandra [name]  Medical record number: [id number]  Date of birth: April 27, 1961    I have seen and evaluated Sandra [name] and discussed their care with the Residency Team.  17 yr. Old female w/ pmhx significant for CAD, HTN, glucose intolerance, depression, hx MCA CVA, anxiety disorder, GERD, hx substance abuse, presented with confusion and altered mental status.  She has been having complaints of hallucinations and recently ran out of her baclofen and clonazepam.  As a result, she states she started to take her husband's zyprexa 4 days ago.  She apparently stopped taking zyprexa four days ago.   She is easily distracted during my exam. She admits to marijuana use.  Denies any CP, cough, SOB, abdominal pain, dysuria, headache. She is noted to be hypokalemic, have a Cr of 1.90 on admission.  Physical Exam: Blood pressure 110/65, pulse 59, temperature 98.3 F (36.8 C), temperature source Oral, resp. rate 16, height 5' 5"  (1.651 m), weight 188 lb 11.2 oz (85.594 kg), SpO2 99.00%.  General: Vital signs reviewed and noted. NAD but easily distracted.  Head: Normocephalic, atraumatic.  Eyes: PERRL, EOMI, No signs of anemia or jaundince.  Nose: Mucous membranes moist, not inflammed, nonerythematous.  Throat: Oropharynx nonerythematous, no exudate appreciated.   Neck: No deformities, masses, or tenderness noted.Supple, No carotid Bruits, no JVD.  Lungs:  Normal respiratory effort. Clear to auscultation BL without crackles or wheezes.  Heart: RRR. S1 and S2 normal without gallop, murmur, or rubs.  Abdomen:  BS normoactive. Soft, Nondistended, non-tender.  No masses or organomegaly.  Extremities: No pretibial edema.  Neurologic: A&O X2, not to time or situation, CN II - XII are grossly intact. Motor strength is 5/5 in the all 4 extremities, Sensations intact to light touch, Cerebellar signs negative.  Skin: No  visible rashes, scars.    Lab results: Results for orders placed during the hospital encounter of 01/23/12 (from the past 24 hour(s))  CBC     Status: Abnormal   Collection Time   01/23/12  5:49 PM      Component Value Range   WBC 7.8  4.0 - 10.5 K/uL   RBC 3.46 (*) 3.87 - 5.11 MIL/uL   Hemoglobin 10.8 (*) 12.0 - 15.0 g/dL   HCT 31.5 (*) 36.0 - 46.0 %   MCV 91.0  78.0 - 100.0 fL   MCH 31.2  26.0 - 34.0 pg   MCHC 34.3  30.0 - 36.0 g/dL   RDW 13.9  11.5 - 15.5 %   Platelets 136 (*) 150 - 400 K/uL  BASIC METABOLIC PANEL     Status: Abnormal   Collection Time   01/23/12  5:49 PM      Component Value Range   Sodium 142  135 - 145 mEq/L   Potassium 2.7 (*) 3.5 - 5.1 mEq/L   Chloride 108  96 - 112 mEq/L   CO2 21  19 - 32 mEq/L   Glucose, Bld 111 (*) 70 - 99 mg/dL   BUN 29 (*) 6 - 23 mg/dL   Creatinine, Ser 1.90 (*) 0.50 - 1.10 mg/dL   Calcium 8.9  8.4 - 10.5 mg/dL   GFR calc non Af Amer 30 (*) >90 mL/min   GFR calc Af Amer 34 (*) >90 mL/min  ETHANOL     Status: Normal   Collection Time   01/23/12  5:49 PM      Component Value  Range   Alcohol, Ethyl (B) <11  0 - 11 mg/dL  URINE RAPID DRUG SCREEN (HOSP PERFORMED)     Status: Abnormal   Collection Time   01/23/12  6:43 PM      Component Value Range   Opiates NONE DETECTED  NONE DETECTED   Cocaine NONE DETECTED  NONE DETECTED   Benzodiazepines NONE DETECTED  NONE DETECTED   Amphetamines NONE DETECTED  NONE DETECTED   Tetrahydrocannabinol POSITIVE (*) NONE DETECTED   Barbiturates NONE DETECTED  NONE DETECTED  URINALYSIS, ROUTINE W REFLEX MICROSCOPIC     Status: Abnormal   Collection Time   01/23/12  6:43 PM      Component Value Range   Color, Urine AMBER (*) YELLOW   APPearance CLOUDY (*) CLEAR   Specific Gravity, Urine 1.023  1.005 - 1.030   pH 5.0  5.0 - 8.0   Glucose, UA NEGATIVE  NEGATIVE mg/dL   Hgb urine dipstick NEGATIVE  NEGATIVE   Bilirubin Urine MODERATE (*) NEGATIVE   Ketones, ur 15 (*) NEGATIVE mg/dL   Protein, ur  NEGATIVE  NEGATIVE mg/dL   Urobilinogen, UA 1.0  0.0 - 1.0 mg/dL   Nitrite NEGATIVE  NEGATIVE   Leukocytes, UA SMALL (*) NEGATIVE  URINE MICROSCOPIC-ADD ON     Status: Abnormal   Collection Time   01/23/12  6:43 PM      Component Value Range   Squamous Epithelial / LPF FEW (*) RARE   WBC, UA 3-6  <3 WBC/hpf   Bacteria, UA FEW (*) RARE   Casts HYALINE CASTS (*) NEGATIVE  GLUCOSE, CAPILLARY     Status: Abnormal   Collection Time   01/23/12 11:56 PM      Component Value Range   Glucose-Capillary 126 (*) 70 - 99 mg/dL   Comment 1 Documented in Chart     Comment 2 Notify RN    MAGNESIUM     Status: Normal   Collection Time   01/24/12  2:10 AM      Component Value Range   Magnesium 2.2  1.5 - 2.5 mg/dL  COMPREHENSIVE METABOLIC PANEL     Status: Abnormal   Collection Time   01/24/12  2:10 AM      Component Value Range   Sodium 147 (*) 135 - 145 mEq/L   Potassium 3.1 (*) 3.5 - 5.1 mEq/L   Chloride 114 (*) 96 - 112 mEq/L   CO2 20  19 - 32 mEq/L   Glucose, Bld 121 (*) 70 - 99 mg/dL   BUN 24 (*) 6 - 23 mg/dL   Creatinine, Ser 1.21 (*) 0.50 - 1.10 mg/dL   Calcium 8.5  8.4 - 10.5 mg/dL   Total Protein 6.1  6.0 - 8.3 g/dL   Albumin 3.4 (*) 3.5 - 5.2 g/dL   AST 13  0 - 37 U/L   ALT 9  0 - 35 U/L   Alkaline Phosphatase 67  39 - 117 U/L   Total Bilirubin 0.5  0.3 - 1.2 mg/dL   GFR calc non Af Amer 51 (*) >90 mL/min   GFR calc Af Amer 59 (*) >90 mL/min  HEMOGLOBIN A1C     Status: Abnormal   Collection Time   01/24/12  2:10 AM      Component Value Range   Hemoglobin A1C 5.9 (*) <5.7 %   Mean Plasma Glucose 123 (*) <117 mg/dL  AMMONIA     Status: Normal   Collection Time   01/24/12  2:11 AM      Component Value Range   Ammonia 20  11 - 60 umol/L    Imaging results:  Ct Head Wo Contrast  01/23/2012  *RADIOLOGY REPORT*  Clinical Data: Altered level of consciousness.  CT HEAD WITHOUT CONTRAST  Technique:  Contiguous axial images were obtained from the base of the skull through the vertex  without contrast.  Comparison: Head CT 01/27/2011.  Findings: Remote right middle cerebral artery territory infarcts are demonstrated.  No acute hemispheric infarction or intracranial hemorrhage.  The ventricles are normal except for mild ex vacuo dilatation of the right lateral ventricle.  No extra-axial fluid collections.  No mass lesions.  The brainstem and cerebellum grossly normal and stable.  The bony structures are intact.  The paranasal sinuses and mastoid air cells are clear.  IMPRESSION:  Remote infarcts but no acute intracranial process or mass lesion.  Original Report Authenticated By: P. Kalman Jewels, M.D.     Assessment and Plan: I agree with the formulated Assessment and Plan with the following changes: 7 yr. Old female w/ pmhx significant for CAD, HTN, glucose intolerance, depression, hx MCA CVA, anxiety disorder, GERD, hx substance abuse, presented with confusion and altered mental status. 1) Acute encephalopathy: likely a component of benzo withdrawal and inappropriate use of her husband's medication. Initiate klonopin at home dose and initiate her home dose of baclofen at a lose dose for how. 2) AKI: start hypotonic fluids with potassium for now, follow UOP. Repeat BMP in AM. 3) Fall risk, sitter for now. -rest per resident physician note.  Dominic Pea, [place] 8/6/20134:04 PM

## 2012-01-25 ENCOUNTER — Inpatient Hospital Stay (HOSPITAL_COMMUNITY): Payer: Medicaid Other

## 2012-01-25 DIAGNOSIS — F1910 Other psychoactive substance abuse, uncomplicated: Secondary | ICD-10-CM

## 2012-01-25 LAB — URINALYSIS, ROUTINE W REFLEX MICROSCOPIC
Glucose, UA: NEGATIVE mg/dL
Hgb urine dipstick: NEGATIVE
Ketones, ur: NEGATIVE mg/dL
Leukocytes, UA: NEGATIVE
pH: 5.5 (ref 5.0–8.0)

## 2012-01-25 LAB — BASIC METABOLIC PANEL
BUN: 15 mg/dL (ref 6–23)
Chloride: 111 mEq/L (ref 96–112)
Creatinine, Ser: 0.83 mg/dL (ref 0.50–1.10)
Glucose, Bld: 103 mg/dL — ABNORMAL HIGH (ref 70–99)
Potassium: 3.6 mEq/L (ref 3.5–5.1)

## 2012-01-25 LAB — CBC
HCT: 34.1 % — ABNORMAL LOW (ref 36.0–46.0)
Hemoglobin: 11.7 g/dL — ABNORMAL LOW (ref 12.0–15.0)
MCV: 90.2 fL (ref 78.0–100.0)
WBC: 7.7 10*3/uL (ref 4.0–10.5)

## 2012-01-25 LAB — CREATININE, URINE, RANDOM: Creatinine, Urine: 71.77 mg/dL

## 2012-01-25 LAB — SODIUM, URINE, RANDOM: Sodium, Ur: 210 mEq/L

## 2012-01-25 LAB — UREA NITROGEN, URINE: Urea Nitrogen, Ur: 446 mg/dL

## 2012-01-25 MED ORDER — POTASSIUM CHLORIDE 2 MEQ/ML IV SOLN
INTRAVENOUS | Status: DC
  Administered 2012-01-25 – 2012-01-26 (×3): via INTRAVENOUS
  Filled 2012-01-25 (×4): qty 1000

## 2012-01-25 MED ORDER — CLONAZEPAM 1 MG PO TABS
1.0000 mg | ORAL_TABLET | Freq: Three times a day (TID) | ORAL | Status: DC
  Administered 2012-01-25 – 2012-01-27 (×5): 1 mg via ORAL
  Filled 2012-01-25 (×5): qty 1

## 2012-01-25 NOTE — Progress Notes (Signed)
INTERNAL MEDICINE TEACHING SERVICE Attending Note  Date: 01/25/2012  Patient name: MARLEE ARMENTEROS  Medical record number: [id number]  Date of birth: 1960-09-13    This patient has been seen and discussed with the house staff. Please see their note for complete details. I concur with their findings with the following additions/corrections: States she has bilat LE pain this morning, but her husband at bedside states she fell recently. She has FROM of bilat LE, passive and active. There is no edema noted. She is slightly more sleepy today, but is able to start discussion about brushing her teeth and some of her medications. Her husband states she is having hallucinations. She is noted to be at baseline per her husband when it comes to strength in UE and LE. She has 4/5 strength in LUE and 5/5 strength in RUE, LE with bilat 5/5 strength. Patient denies HA, neck pain, fever, chills.  A/P: Patient is a 51 y.o., female with a PMHX significant for CAD, HTN, glucose intolerance, depression, hx MCA CVA, anxiety disorder, GERD, hx substance abuse, presented with confusion and altered mental status.  1) Acute encephalopathy:  Obtain EKG to evaluate QTc. MRI brain w/o contrast due to hx CVA and concern for a new event.  She had a fever of 100.3 last night, will continue to watch, obtain cultures and CXR. I do not think she needs an LP at this time. This is likely related to benzo withdrawal and use of her husband's zyprexa. 2) AKI: Improving. Continue IVF w/ K. Likely diuretic induced. 3) Fall risk, sitter for now.  -rest per resident physician note.    Dominic Pea, DO  01/25/2012, 3:52 PM

## 2012-01-25 NOTE — Progress Notes (Signed)
Subjective:  Patient is a 51 yo female with a history of anxiety/depression, CAD, CVA 2012, HTN, HLD. She presents with altered mental status 1 week after running out of her medication. Her husband and niece are unsure of all the names except for Klonopin, which they say she has been taking more than the prescribed amount. She started acting abnormal the day of admission when she started talking to people who were not there, was jittery, confusing her husband with other people, and making up stories. Husband admits she uses marijuana on a daily basis (1-2 blunts per day) but denies any alcohol use, or other drugs. Husband also states that she has been unable to sleep the past few nights and took some of his pills while she was searching for a sleep aid. He is not sure what pills she took but he says the only medications he is on are zyprexa and antihypertensives.  Patient is laying in bed with her eyes closed. Wakes up when spoken to, conversant but starts irrelevant conversation. Thinks she is at home. She is not complaining of any pain but she says she is tired today. Denies dizziness, chest pain, headache, neck stiffness, fever or chills, cough, diarrhea or vomiting. + hx of "fever blisters"   Objective: Vital signs in last 24 hours: Filed Vitals:   01/24/12 2120 01/25/12 0528 01/25/12 0908 01/25/12 1340  BP:  117/60 136/81 109/56  Pulse: 80 75 81 73  Temp:  100.3 F (37.9 C) 99 F (37.2 C) 98.3 F (36.8 C)  TempSrc:  Oral Oral Oral  Resp:  20 28 24   Height:      Weight:      SpO2:  99% 98% 98%   Weight change:   Intake/Output Summary (Last 24 hours) at 01/25/12 1431 Last data filed at 01/25/12 0209  Gross per 24 hour  Intake   1075 ml  Output    200 ml  Net    875 ml   Physical Exam Blood pressure 109/56, pulse 73, temperature 98.3 F (36.8 C), temperature source Oral, resp. rate 24, height 5\' 5"  (1.651 m), weight 188 lb 11.2 oz (85.594 kg), SpO2 98.00%. General: vitals  reviewed. Patient is fidgeting with her hands, oriented to person only  HEENT:  Anicteric, PERRL, EOMI, no lymphadenopathy, moist mucous moist Cardiovascular:  Regular rate and rhythm, no murmurs, rubs or gallops Respiratory:  Clear to auscultation bilaterally, no wheezes, rales, or rhonchi Abdomen:  Soft, nondistended, nontender, normoactive bowel sounds Extremities:  UE with spastic rigidity, hyperreflexic b/l, symmetric. RUE trength 2/5, LUE 1/5. Contractures of hands b/l. Warm and well-perfused, no clubbing, cyanosis, or edema. LE strength intact. Moving all four extremities but R>L on my exam. She has mild rigidity and muscle spasms bilaterally. Skin: Warm, dry, multiple scabs and old scars on LE Neuro: Drowsy but easily aroused. Patient appears anxious and jittery, tremors noted b/l UE, she does not know where she is but tell me the president's name. She has a decreased attention span and skips from one subject to the next. Frequently makes hand motions as if she is brushing her teeth or taking a pill. +disorganized thinking Cranial nerves in tact. Sensation in tact. Gait testing deferred. Pt noncompliant with rest of neuro exam.   Lab Results: Basic Metabolic Panel:  Lab 01/25/12 1610 01/24/12 1527 01/24/12 0210  NA 141 144 --  K 3.6 3.6 --  CL 111 114* --  CO2 20 21 --  GLUCOSE 103* 114* --  BUN  15 19 --  CREATININE 0.83 0.93 --  CALCIUM 8.9 9.2 --  MG -- -- 2.2  PHOS -- -- --   Liver Function Tests:  Lab 01/24/12 1527 01/24/12 0210  AST 15 13  ALT 12 9  ALKPHOS 73 67  BILITOT 0.5 0.5  PROT 6.7 6.1  ALBUMIN 3.6 3.4*   No results found for this basename: LIPASE:2,AMYLASE:2 in the last 168 hours  Lab 01/24/12 0211  AMMONIA 20   CBC:  Lab 01/25/12 0535 01/23/12 1749  WBC 7.7 7.8  NEUTROABS -- --  HGB 11.7* 10.8*  HCT 34.1* 31.5*  MCV 90.2 91.0  PLT 133* 136*   Cardiac Enzymes: No results found for this basename: CKTOTAL:3,CKMB:3,CKMBINDEX:3,TROPONINI:3 in the  last 168 hours BNP: No results found for this basename: PROBNP:3 in the last 168 hours D-Dimer: No results found for this basename: DDIMER:2 in the last 168 hours CBG:  Lab 01/23/12 2356  GLUCAP 126*   Hemoglobin A1C:  Lab 01/24/12 0210  HGBA1C 5.9*   Fasting Lipid Panel: No results found for this basename: CHOL,HDL,LDLCALC,TRIG,CHOLHDL,LDLDIRECT in the last 161 hours Thyroid Function Tests: No results found for this basename: TSH,T4TOTAL,FREET4,T3FREE,THYROIDAB in the last 168 hours Coagulation: No results found for this basename: LABPROT:4,INR:4 in the last 168 hours Anemia Panel: No results found for this basename: VITAMINB12,FOLATE,FERRITIN,TIBC,IRON,RETICCTPCT in the last 168 hours Urine Drug Screen: Drugs of Abuse     Component Value Date/Time   LABOPIA NONE DETECTED 01/23/2012 1843   COCAINSCRNUR NONE DETECTED 01/23/2012 1843   LABBENZ NONE DETECTED 01/23/2012 1843   AMPHETMU NONE DETECTED 01/23/2012 1843   THCU POSITIVE* 01/23/2012 1843   LABBARB NONE DETECTED 01/23/2012 1843    Alcohol Level:  Lab 01/23/12 1749  ETH <11   Urinalysis:  Lab 01/23/12 1843  COLORURINE AMBER*  LABSPEC 1.023  PHURINE 5.0  GLUCOSEU NEGATIVE  HGBUR NEGATIVE  BILIRUBINUR MODERATE*  KETONESUR 15*  PROTEINUR NEGATIVE  UROBILINOGEN 1.0  NITRITE NEGATIVE  LEUKOCYTESUR SMALL*    Micro Results: No results found for this or any previous visit (from the past 240 hour(s)). Studies/Results: Ct Head Wo Contrast  01/23/2012  *RADIOLOGY REPORT*  Clinical Data: Altered level of consciousness.  CT HEAD WITHOUT CONTRAST  Technique:  Contiguous axial images were obtained from the base of the skull through the vertex without contrast.  Comparison: Head CT 01/27/2011.  Findings: Remote right middle cerebral artery territory infarcts are demonstrated.  No acute hemispheric infarction or intracranial hemorrhage.  The ventricles are normal except for mild ex vacuo dilatation of the right lateral ventricle.   No extra-axial fluid collections.  No mass lesions.  The brainstem and cerebellum grossly normal and stable.  The bony structures are intact.  The paranasal sinuses and mastoid air cells are clear.  IMPRESSION:  Remote infarcts but no acute intracranial process or mass lesion.  Original Report Authenticated By: P. Loralie Champagne, M.D.   Medications:  Scheduled Meds:    . sodium chloride   Intravenous STAT  . amLODipine  5 mg Oral Daily  . baclofen  5 mg Oral TID  . clonazePAM  1 mg Oral BID  . clopidogrel  75 mg Oral Q breakfast  . enoxaparin (LOVENOX) injection  40 mg Subcutaneous Q24H  . LORazepam  1 mg Intravenous Once  . metoprolol  25 mg Oral BID  . simvastatin  20 mg Oral q1800   Continuous Infusions:    . dextrose 5 % and 0.45% NaCl 1,000 mL with potassium chloride 20 mEq  infusion 75 mL/hr at 01/25/12 1233  . DISCONTD: sodium chloride 125 mL/hr at 01/24/12 1040  . DISCONTD: sodium chloride 0.45 % 1,000 mL with potassium chloride 20 mEq infusion 125 mL/hr at 01/25/12 0209   PRN Meds:. Assessment/Plan:   Acute Confusion/AMS - patient continues to appear extremely anxious and fidgety, intermittently agitated, responds to absent stimuli, appears to be hallucinating -suspect benzodiazepine withdrawal as patient has been off home klonopin, she had been taking more than her prescribed dose according to family. Ammonia WNL, no white count to suggest infectious etiology, blood ETOH level neg -DDX: benzo withdraral vs acute discontinuation of baclofen vs. ingestion of husband's zyprexa, consider metabolic derangement as possible etiology, consider intracranial process or stroke as patient has past history of stroke and presents altered, drug abuse(UDS positive for THC), vitamin deficiencies -also consider primary CNS infection - meningitis (viral>fungal, bacterial), HSV encephalitis. Patient with Temp 100.3 this morning, though subsequently returned to 98.3   -will obtain MRI brain  today -EKG to evaluate QTC, blood culture x2, CXR, repeat UA and culture -cont home benzo, low-dose baclofen and continue to monitor -cont neuro exams -contsitter at bedside as pt is fall risk -psych consult -will consider LP if patient continues to be altered  Leg Pain -could not illicit pain on my exam today, though she was complaining overnight -doubt DVT/PE given patient is not tachycardic or requiring oxygen -cont to monitor, consider LE duplex if pain continues  Hypokalemia: Resolved. Pt with K 2.7 on admission. -will replete K and Mag as needed and recheck frequently -cont to hold HCTZ  Hypernatremia - Resolved, patient appears euvolemic -will continue to monitor  Hypoalbuminemia -albumin 3.4 -ketones in urine, may be malnourished. Denies etoh use -will advance diet and monitor  Acute Kidney Injury - Resolved. Cr 1.9 on admission, today was .83  -unclear etiology, BUN: Cr ratio is ~20 -will cont to monitor -in and out cath, ? Patient able to use bed pain  Normocytic Anemia -etiology unclear -no evidence of active bleeding -will monitor H/H  History of stroke -cont Plavix  CAD -cont home statin, BB  Nutrition -patient able to eat only some of her meals with help -start D5 .45% NS with KCL at 75 cc/hr  PPX -lovenox  Dispo -patient currently altered from baseline mental status, not able to go home at this time    LOS: 2 days   [place] Ar 01/25/2012, 2:31 PM

## 2012-01-25 NOTE — Progress Notes (Signed)
Dr Reeves Dam notified that pt ran ventricular bigeminy on tele. Also, that husband went home and counted pill in current bottle of his zyprex and he states that 40 pills are unaccounted for. Husband suspects that pt may have taken a total of 40 pills over the course of a couple days. Pt remains stable. Will continue to monitor. [place], Rosanna Randy

## 2012-01-25 NOTE — Progress Notes (Addendum)
INTERVAL PROGRESS NOTE:  Subjective: Patient complaining of pain in RLE to nurse early in AM. Went to evaluate patient at 0400 on 01/25/2012. Nurse informs that patient had not slept at all for the entire night, and has been agitated.  Objective: On exam, patient did not have tenderness to palpation of either calf. No asymmetry of lower extremities. Patient did however had significant tenderness on passive dorsiflexion of right foot, causing her to become highly agitated. Patient did not have any tenderness on deep palpation of any area of RLE, including the ankle.  Assessment/Plan: RLE pain of unknown etiology. Unlikely secondary to trauma, as patient does not have tenderness to palpation of any area despite significant pain at rest. Addtionally, she did not have this pain on admission, and in the interval since that time she has had a 24hr sitter, guardrails in place, and instructions for strict bedrest. Per the sitter, she has not been OOB since admission. Due to concern for DVT secondary to new onset of pain today in absence of identifiable trauma, further evaluation is necessary. - notify primary team of events this AM

## 2012-01-25 NOTE — Progress Notes (Signed)
Pt running ventricular trigeminy on telemetry. According to record, pt ran trigeminy on 01/23/12. Internal Med Teaching Service on unit. Notified team of tele change and strip given. Pt stable. Will continue to monitor. [place], Rosanna Randy

## 2012-01-26 DIAGNOSIS — F331 Major depressive disorder, recurrent, moderate: Secondary | ICD-10-CM

## 2012-01-26 DIAGNOSIS — E876 Hypokalemia: Secondary | ICD-10-CM

## 2012-01-26 DIAGNOSIS — F329 Major depressive disorder, single episode, unspecified: Secondary | ICD-10-CM

## 2012-01-26 DIAGNOSIS — I10 Essential (primary) hypertension: Secondary | ICD-10-CM

## 2012-01-26 DIAGNOSIS — E785 Hyperlipidemia, unspecified: Secondary | ICD-10-CM

## 2012-01-26 DIAGNOSIS — F411 Generalized anxiety disorder: Secondary | ICD-10-CM

## 2012-01-26 DIAGNOSIS — F1320 Sedative, hypnotic or anxiolytic dependence, uncomplicated: Secondary | ICD-10-CM

## 2012-01-26 LAB — CBC
MCV: 89.6 fL (ref 78.0–100.0)
Platelets: 122 10*3/uL — ABNORMAL LOW (ref 150–400)
RDW: 13.2 % (ref 11.5–15.5)
WBC: 7.2 10*3/uL (ref 4.0–10.5)

## 2012-01-26 LAB — URINE CULTURE
Colony Count: NO GROWTH
Culture: NO GROWTH

## 2012-01-26 LAB — BASIC METABOLIC PANEL
CO2: 20 mEq/L (ref 19–32)
Calcium: 8.8 mg/dL (ref 8.4–10.5)
Glucose, Bld: 103 mg/dL — ABNORMAL HIGH (ref 70–99)
Sodium: 138 mEq/L (ref 135–145)

## 2012-01-26 MED ORDER — ACETAMINOPHEN 325 MG PO TABS
650.0000 mg | ORAL_TABLET | Freq: Four times a day (QID) | ORAL | Status: DC | PRN
  Administered 2012-01-26 – 2012-01-27 (×2): 650 mg via ORAL
  Filled 2012-01-26 (×2): qty 2

## 2012-01-26 MED ORDER — METOPROLOL TARTRATE 25 MG PO TABS
25.0000 mg | ORAL_TABLET | Freq: Two times a day (BID) | ORAL | Status: DC
  Administered 2012-01-26: 25 mg via ORAL
  Filled 2012-01-26 (×3): qty 1

## 2012-01-26 MED ORDER — BISACODYL 10 MG RE SUPP
10.0000 mg | Freq: Once | RECTAL | Status: AC
  Administered 2012-01-26: 10 mg via RECTAL
  Filled 2012-01-26: qty 1

## 2012-01-26 MED ORDER — ZOLPIDEM TARTRATE 5 MG PO TABS
5.0000 mg | ORAL_TABLET | Freq: Every evening | ORAL | Status: DC | PRN
  Administered 2012-01-26: 5 mg via ORAL
  Filled 2012-01-26: qty 1

## 2012-01-26 MED ORDER — SODIUM CHLORIDE 0.9 % IV SOLN
Freq: Once | INTRAVENOUS | Status: AC
  Administered 2012-01-26: 10:00:00 via INTRAVENOUS

## 2012-01-26 NOTE — Progress Notes (Signed)
Pt is receiving bath.  Will return for Consultation.  Tadeo Besecker J. Ferol Luz, MD Psychiatrist . 01/26/2012 1:03 PM

## 2012-01-26 NOTE — Consult Note (Signed)
Attempted to see patient, however patient receiving care from nursing and a bath. Sitter in room with patient. Will re-attempt this afternoon. Spoke with Psych attending regarding patient, and she is aware. Reviewed chart and will also work to reach husband.  Full assessment to come.  Ashley Jacobs, MSW LCSW [phone]

## 2012-01-26 NOTE — Consult Note (Signed)
Patient Identification:  Sandra [name] Date of Evaluation:  01/26/2012 Reason for Consult: Confusion, Hallucinations  Referring Provider: Dolly Rias  History of Present Illness:Pt says she has always taken Klonopin [other benzos] for sleep because of her 'racing thoughts'.  She had used all of her Rx, taking more than prescribed.  She began taking a few of her husband's Zyprexa pills   Over time she had taken more than 5 [mg dose not named].  He husband knew she was 'not right' and she was brought to the ED.  She says she knows she took too many but was not suicidal.  It was reported she was delusional and had tremors.    Past Psychiatric History:She has never seen a psychiatrist.  Her PCP gave her Xanax.  She became addicted and went to detox.  Then she was given Ativan; then finally Klonopin. She uses more than label directions.  Past Medical History:     Past Medical History  Diagnosis Date  . PFO (patent foramen ovale) August 2012    PFO seen on TEE during hospitalization in 01/2011.  Patient to f/u with Dr. Pearlean Brownie, neurology, for enrollment in trial for medical treatment of PFO  . CAD (coronary artery disease) 2007    istory of MI with stent in 2007 by Dr. Park Breed, [facility]. Stent placement by Dr Karoline Caldwell   . Prediabetes 03/07/2011  . Hypertension 03/07/2011  . Hyperlipidemia 03/07/2011  . Tobacco abuse 03/07/2011  . Depression 03/07/2011  . Psoriasis 03/07/2011  . Acute right MCA stroke 01/27/11    Acute R MCA stroked  with L arm and leg weakness  . Anxiety 1980s    On Klonipin since age 21. Had addiction problem with Xanax which  Was therefore d/c . Seen in the past at [facility]   . Fractured toe 01/2011    History of left fifth toe proximal phalanx fracture when patient had a stroke (01/2011) and fell. Seen by Dr Victorino Dike.   . Incontinence 04/14/2011  . Arthritis 04/16/2011  . Myocardial infarct, old   . Tobacco abuse 03/07/2011    Quit 01/2011   . Fracture of fifth toe,  left, closed 01/28/2011    History of left fifth toe proximal phalanx fracture when patient had a stroke (01/2011) and fell. Seen by Dr Victorino Dike.    Marland Kitchen GERD (gastroesophageal reflux disease)   . Substance abuse     h/o narcotic abuse pt denies as of 01/09/12       Past Surgical History  Procedure Date  . Cardiac stent     2008    Allergies:  Allergies  Allergen Reactions  . Azor (Amlodipine-Olmesartan) Other (See Comments)    Drops blood pressure too low    Current Medications:  Prior to Admission medications   Medication Sig Start Date End Date Taking? Authorizing Provider  amLODipine (NORVASC) 5 MG tablet Take 1 tablet (5 mg total) by mouth daily. Prescribed by Dr Jacinto Halim 11/04/11  Yes Melida Quitter, MD  baclofen (LIORESAL) 10 MG tablet Take 10 mg by mouth 3 (three) times daily.   Yes Historical Provider, MD  clonazePAM (KLONOPIN) 1 MG tablet Take 1 tablet (1 mg total) by mouth 2 (two) times daily. 12/06/11  Yes Almyra Deforest, MD  clopidogrel (PLAVIX) 75 MG tablet Take 1 tablet (75 mg total) by mouth daily. 11/01/11  Yes Almyra Deforest, MD  famotidine (PEPCID) 20 MG tablet Take 1 tablet (20 mg total) by mouth 2 (two) times daily. 01/09/12 01/08/13  Yes Annett Gula, MD  HYDROcodone-acetaminophen (VICODIN) 5-500 MG per tablet Take 1 tablet by mouth every 6 (six) hours as needed. For pain   Yes Historical Provider, MD  lisinopril-hydrochlorothiazide (PRINZIDE) 20-12.5 MG per tablet Take 1 tablet by mouth 2 (two) times daily. 11/04/11 11/03/12 Yes Melida Quitter, MD  metoprolol (LOPRESSOR) 50 MG tablet Take 25 mg by mouth 3 (three) times daily.   Yes Historical Provider, MD  potassium chloride (K-DUR) 10 MEQ tablet Take 1 tablet (10 mEq total) by mouth daily. 01/11/12 01/10/13 Yes Annett Gula, MD  pravastatin (PRAVACHOL) 40 MG tablet Take 1 tablet (40 mg total) by mouth daily. 11/04/11  Yes Melida Quitter, MD  solifenacin (VESICARE) 5 MG tablet Take 1 tablet (5 mg total) by mouth daily. 09/15/11  Yes  Mathis Dad, MD    Social History:    reports that she has been smoking Cigarettes.  She has been smoking about 2 packs per day. She does not have any smokeless tobacco history on file. She reports that she does not drink alcohol or use illicit drugs.   Family History:    No family history on file.  Mental Status Examination/Evaluation: Objective:  Appearance: Casual  Psychomotor Activity:  Normal  Eye Contact::  Good  Speech:  Clear and Coherent and Normal Rate  Volume:  Normal  Mood:  Anxious and Dysphoric  Affect:  Congruent  Thought Process:  Coherent, Relevant and Intact  Orientation:  Full  Thought Content:  Altered by benzos, cannabis  Suicidal Thoughts:  No  Homicidal Thoughts:  No  Judgement:  Poor  Insight:  Lacking    DIAGNOSIS:   AXIS I   Depression, moderate, recurrent, prescription abuse/dependence  AXIS II  Deffered  AXIS III See medical notes.  AXIS IV economic problems, occupational problems, other psychosocial or environmental problems, problems related to social environment and problems with access to health care services  AXIS V 61-70 mild symptoms  8813 Assessment/Plan:  Discussed with Nicolasa Ducking, Psych CSW   Pt evaluation concluded 3pm 01/26/12 Pt is awake, alert lying in bed.  The sitter leaves the room.  The husband sleeps in the recliner.  She has spontaneous speech and good eye contact  She says she was addicted first the Xanax Rx and after detox, weaned from Ativan and now takes Klonopin to get to sleep and overcome 'my racing thoughts' at night.  She says she took Ambien but she made phone calls she does not recall and she was sleep walking.  She is told Klonopin is not a hypnotic and there are other choices that can be offered.  She did not like trazodone or Serquel [also not a hypnotic].  She says she would be willing to try Lunesta.  She says she has never seen a psychiatrist.  Yet, she says she has cried all the time, "my world collapsed when my  mother died' - never talked to a therapist, and she has never been treated for racing thoughts.  She has been depressed but denies suicidal thoughts.  She denies drinking alcohol or smoking.  She had a stroke last year that caused a L-sided weakness.  She walks with a limp.  She does smoke cannabis with her husband -POSitive on UDS   She has a son who does not use drugs.  He is drug tested for his work.   Pt agrees to see a psychiatrist and therapist.  She is encouraged to discuss all her symptoms and stress/grief  she has not dealt with.  She has anxiety, racing thoughts and has become dependent upon benzodiazepine  Other medications may be used to address her several symptoms.  Therapeutic sessions for her to learn more effective coping skills may displace her dependence upon pills.  She has taken Lunesta before for sleep.  It is scheduled hypnotic  to take just before getting into bed and induces and sustains sleep.  It may have a bad taste in am that can be counteracted with OJ or carbonated flavored water RECOMMENDATION:  1.  Pt is no longer delusional; denies hallucinations and has capacity to return home when medically stable. 2.  Psych CSW is requested to provide community psychiatrist and therapist once discharged.  3.  Klonopin needs to be reduced to lowest dose as soon as possible and is not to be taken with a hypnotic for sleep. 4.  Suggest Ambien needs to be listed among allergies 5.  Pt has been given several SSRIs, does not like Zoloft, Wellbutrin DNRI 6. Suggest Effexor XR [venlafaxine] 75 mg in am  Very important to keep her hypertension under control with medication and frequent monitoring.  This is indicated for anxiety, depression, social anxiety.  7.  Suggest Lunesta for sleep 3 mg oral at 8.  No further psychiatric needs identified.  MD Psychiatrist signs off Irvin Lizama J. Ferol Luz, MD Psychiatrist  01/26/2012 9:56 PM

## 2012-01-26 NOTE — Progress Notes (Signed)
Subjective:  [name] is conversant this morning. She admits to taking her husbands zyprexa, about 40 pills, over several days right before admission. She denies wanting to hurt herself, she just "wanted sleeping help". Admits she is addicted to [employer].  She is complaining of foot pain and says she remembers falling down a lot and having balance issues after taking the zyprexa at home. Able to bear weight.  She denies headache, visual disturbances, visual and auditory hallucinations. No abdominal pain, dysuria, chest pain, SOB.   Objective: Vital signs in last 24 hours: Filed Vitals:   01/26/12 1000 01/26/12 1218 01/26/12 1220 01/26/12 1405  BP: 106/73 120/74 96/64 100/58  Pulse:  71 71 79  Temp:    99 F (37.2 C)  TempSrc:    Oral  Resp:    20  Height:      Weight:      SpO2:    96%   Weight change:   Intake/Output Summary (Last 24 hours) at 01/26/12 1509 Last data filed at 01/26/12 1300  Gross per 24 hour  Intake   1475 ml  Output    310 ml  Net   1165 ml   Physical Exam Blood pressure 100/58, pulse 79, temperature 99 F (37.2 C), temperature source Oral, resp. rate 20, height 5\' 5"  (1.651 m), weight 181 lb 6.4 oz (82.283 kg), SpO2 96.00%. General: vitals reviewed. Patient is alert and oriented x3 HEENT:  Anicteric, PERRL, EOMI, no lymphadenopathy, moist mucous moist Cardiovascular:  Regular rate and rhythm, no murmurs, rubs or gallops Respiratory:  Clear to auscultation bilaterally, no wheezes, rales, or rhonchi Abdomen:  Soft, nondistended, nontender, normoactive bowel sounds Extremities:  UE with spastic rigidity, hyperreflexic b/l, symmetric. RUE trength 2/5, LUE 1/5. Contractures of hands b/l. Warm and well-perfused, no clubbing, cyanosis, or edema. LE strength intact.  No twitching of extremities, no fidgeting. Skin: Warm, dry, multiple scabs and old scars on LE Neuro: Patient is alert and conversant. Oriented x3. Memory intact. No tremors. Cranial nerves in  tact.   Lab Results: Basic Metabolic Panel:  Lab 01/26/12 1610 01/25/12 0535 01/24/12 0210  NA 138 141 --  K 4.0 3.6 --  CL 108 111 --  CO2 20 20 --  GLUCOSE 103* 103* --  BUN 12 15 --  CREATININE 0.82 0.83 --  CALCIUM 8.8 8.9 --  MG -- -- 2.2  PHOS -- -- --   Liver Function Tests:  Lab 01/24/12 1527 01/24/12 0210  AST 15 13  ALT 12 9  ALKPHOS 73 67  BILITOT 0.5 0.5  PROT 6.7 6.1  ALBUMIN 3.6 3.4*   No results found for this basename: LIPASE:2,AMYLASE:2 in the last 168 hours  Lab 01/24/12 0211  AMMONIA 20   CBC:  Lab 01/26/12 0550 01/25/12 0535  WBC 7.2 7.7  NEUTROABS -- --  HGB 10.9* 11.7*  HCT 31.8* 34.1*  MCV 89.6 90.2  PLT 122* 133*   Cardiac Enzymes:  Lab 01/26/12 1025  CKTOTAL 91  CKMB --  CKMBINDEX --  TROPONINI --   BNP: No results found for this basename: PROBNP:3 in the last 168 hours D-Dimer: No results found for this basename: DDIMER:2 in the last 168 hours CBG:  Lab 01/23/12 2356  GLUCAP 126*   Hemoglobin A1C:  Lab 01/24/12 0210  HGBA1C 5.9*   Fasting Lipid Panel: No results found for this basename: CHOL,HDL,LDLCALC,TRIG,CHOLHDL,LDLDIRECT in the last 960 hours Thyroid Function Tests: No results found for this basename: TSH,T4TOTAL,FREET4,T3FREE,THYROIDAB in the last  168 hours Coagulation: No results found for this basename: LABPROT:4,INR:4 in the last 168 hours Anemia Panel: No results found for this basename: VITAMINB12,FOLATE,FERRITIN,TIBC,IRON,RETICCTPCT in the last 168 hours Urine Drug Screen: Drugs of Abuse     Component Value Date/Time   LABOPIA NONE DETECTED 01/23/2012 1843   COCAINSCRNUR NONE DETECTED 01/23/2012 1843   LABBENZ NONE DETECTED 01/23/2012 1843   AMPHETMU NONE DETECTED 01/23/2012 1843   THCU POSITIVE* 01/23/2012 1843   LABBARB NONE DETECTED 01/23/2012 1843    Alcohol Level:  Lab 01/23/12 1749  ETH <11   Urinalysis:  Lab 01/25/12 1535 01/23/12 1843  COLORURINE YELLOW AMBER*  LABSPEC 1.014 1.023  PHURINE  5.5 5.0  GLUCOSEU NEGATIVE NEGATIVE  HGBUR NEGATIVE NEGATIVE  BILIRUBINUR SMALL* MODERATE*  KETONESUR NEGATIVE 15*  PROTEINUR NEGATIVE NEGATIVE  UROBILINOGEN 1.0 1.0  NITRITE NEGATIVE NEGATIVE  LEUKOCYTESUR NEGATIVE SMALL*    Micro Results: Recent Results (from the past 240 hour(s))  CULTURE, BLOOD (ROUTINE X 2)     Status: Normal (Preliminary result)   Collection Time   01/25/12 12:10 PM      Component Value Range Status Comment   Specimen Description BLOOD ARM RIGHT   Final    Special Requests BOTTLES DRAWN AEROBIC ONLY 10CC   Final    Culture  Setup Time 01/25/2012 22:12   Final    Culture     Final    Value:        BLOOD CULTURE RECEIVED NO GROWTH TO DATE CULTURE WILL BE HELD FOR 5 DAYS BEFORE ISSUING A FINAL NEGATIVE REPORT   Report Status PENDING   Incomplete   CULTURE, BLOOD (ROUTINE X 2)     Status: Normal (Preliminary result)   Collection Time   01/25/12 12:15 PM      Component Value Range Status Comment   Specimen Description BLOOD HAND RIGHT   Final    Special Requests BOTTLES DRAWN AEROBIC ONLY 10CC   Final    Culture  Setup Time 01/25/2012 22:12   Final    Culture     Final    Value:        BLOOD CULTURE RECEIVED NO GROWTH TO DATE CULTURE WILL BE HELD FOR 5 DAYS BEFORE ISSUING A FINAL NEGATIVE REPORT   Report Status PENDING   Incomplete    Studies/Results: Dg Chest 2 View  01/25/2012  *RADIOLOGY REPORT*  Clinical Data: Altered mental status, smoker  CHEST - 2 VIEW  Comparison: 08/12/2011  Findings: Borderline cardiomegaly noted.  There is elevation of the right hemidiaphragm.  Left basilar atelectasis or infiltrate is noted.  No pulmonary edema.  IMPRESSION: Elevation of the right hemidiaphragm.  No pulmonary edema.  Left basilar atelectasis or infiltrate.  Original Report Authenticated By: Natasha Mead, M.D.   Medications:  Scheduled Meds:    . sodium chloride   Intravenous Once  . amLODipine  5 mg Oral Daily  . bisacodyl  10 mg Rectal Once  . clonazePAM  1 mg Oral  TID  . clopidogrel  75 mg Oral Q breakfast  . enoxaparin (LOVENOX) injection  40 mg Subcutaneous Q24H  . metoprolol  25 mg Oral BID  . simvastatin  20 mg Oral q1800  . DISCONTD: baclofen  5 mg Oral TID  . DISCONTD: metoprolol  25 mg Oral BID   Continuous Infusions:    . dextrose 5 % and 0.45% NaCl 1,000 mL with potassium chloride 20 mEq infusion 75 mL/hr at 01/26/12 1208   PRN Meds:. Assessment/Plan:  Patient  is a 51 yo female with a history of anxiety/depression, CAD, CVA 2012, HTN, HLD. She presents with altered mental status 1 week after running out of her medication. Husband says 40 pills of his Zyprexa are missing.  Zyprexa ingestion/toxicity - Resolved. She is alert and oriented, husband says she is back to baseline. Patient admits to taking about 40 pills of her husbands zyprexa because she couldn't sleep. No SI.  -resolved, but will cont to monitor -appreciate psych recommendations -patient likely with untreated sleep disorder -consider using Lunesta which she says has helped in the past  Depression/Anxiety -patient with racing thoughts, abusing Klonopin and history of BZD addiction -psych to set up f/u with psychiatrist for outpatient treatment -low dose klonopin and consider tapering this medication as outpatient  Foot Pain -history of falls while on zyprexa, likely bruising and MSK pain -doubt DVT/PE given patient is not tachycardic or requiring oxygen -cont to monitor, consider LE duplex if pain continues  Hypokalemia: Resolved. Pt with K 2.7 on admission. -will replete K and Mag as needed and recheck frequently -cont to hold HCTZ  Hypernatremia - Resolved, patient appears euvolemic -will continue to monitor  Hypoalbuminemia -albumin 3.4 -ketones in urine, may be malnourished. Denies etoh use -will advance diet and monitor  Acute Kidney Injury - Resolved -will cont to monitor  Normocytic Anemia -etiology unclear -no evidence of active bleeding -will  monitor H/H  History of stroke -cont Plavix  CAD -cont home statin, BB  Nutrition -heart diet -d/c fluids  PPX -lovenox  Dispo -patient is at baseline mental status -discussed possible dc home with psych f/u    LOS: 3 days   Denton Ar 01/26/2012, 3:09 PM

## 2012-01-26 NOTE — Progress Notes (Signed)
INTERNAL MEDICINE TEACHING SERVICE Attending Note  Date: 01/26/2012  Patient name: Sandra [name]  Medical record number: [id number]  Date of birth: 08/23/1960    This patient has been seen and discussed with the house staff. Please see their note for complete details. I concur with their findings with the following additions/corrections: Feels better this morning. Her husband reported she took over 40 zyprexa tablets prior to admission. She is much more alert this morning. Per RN, was noted to have SBP in 80's, but asymptomatic in AM. Admits to recent fall after taking her husband's medication. Her LE exam shows FROM active and passive, but she complains of "soreness". This may be secondary to recent fall, I do not see any evidence of physical exam concerning for fracture.   A/P: Patient is a 51 y.o., female with a PMHX significant for CAD, HTN, glucose intolerance, depression, hx MCA CVA, anxiety disorder, GERD, hx substance abuse, presented with confusion and altered mental status.  1) Acute encephalopathy: This is resolved. This was very likely due to taking an excessive amount of Zyprexa. She admits she ran out of her medications, leading her to do this. She denies any suicidal ideation or intent to harm herself. I educated her on toxicity effects from her medications as well as not to take her husbands medication. She  has underlying depression and anxiety. I would consider using zoloft in addition to klonopin and possibly ambien 5 mg at night. Psych is following. 2) Hypotension: This AM noted at bedside to have SBP 80's, but was asymptomatic. Baclofen should be given only bid-tid PRN, would avoid scheduled dose. Ordered 500 cc NS this morning, with improvement SBP to 100. I suspect medication effect leading to this. 2) AKI: Resolved. 3) PT/OT 4) HTN: hold HCTZ. She will need to be re-evaluated as outpatient. 5) D/C likely in AM. -rest per resident physician note.   Dominic Pea, DO    01/26/2012, 4:41 PM

## 2012-01-26 NOTE — Progress Notes (Signed)
BP 90/57 HR 61, MD notified, no orders given. Will continue to monitor. Steele Berg RN

## 2012-01-27 LAB — BASIC METABOLIC PANEL
BUN: 14 mg/dL (ref 6–23)
CO2: 22 mEq/L (ref 19–32)
Chloride: 111 mEq/L (ref 96–112)
Creatinine, Ser: 0.78 mg/dL (ref 0.50–1.10)
Glucose, Bld: 102 mg/dL — ABNORMAL HIGH (ref 70–99)

## 2012-01-27 LAB — CBC
HCT: 31.6 % — ABNORMAL LOW (ref 36.0–46.0)
Hemoglobin: 11 g/dL — ABNORMAL LOW (ref 12.0–15.0)
MCV: 90.3 fL (ref 78.0–100.0)
RBC: 3.5 MIL/uL — ABNORMAL LOW (ref 3.87–5.11)
RDW: 13.5 % (ref 11.5–15.5)
WBC: 6.2 10*3/uL (ref 4.0–10.5)

## 2012-01-27 MED ORDER — METOPROLOL SUCCINATE 12.5 MG HALF TABLET
12.5000 mg | ORAL_TABLET | Freq: Every day | ORAL | Status: DC
  Filled 2012-01-27: qty 1

## 2012-01-27 MED ORDER — ZOLPIDEM TARTRATE 5 MG PO TABS: 5.0000 mg | ORAL_TABLET | Freq: Every evening | ORAL | Status: DC | PRN

## 2012-01-27 MED ORDER — CYCLOBENZAPRINE HCL 5 MG PO TABS: 5.0000 mg | ORAL_TABLET | Freq: Two times a day (BID) | ORAL | Status: AC | PRN

## 2012-01-27 MED ORDER — CLONAZEPAM 1 MG PO TABS: 0.5000 mg | ORAL_TABLET | Freq: Three times a day (TID) | ORAL | Status: DC | PRN

## 2012-01-27 MED ORDER — METOPROLOL SUCCINATE 12.5 MG HALF TABLET: 12.5000 mg | ORAL_TABLET | Freq: Every day | ORAL | Status: DC

## 2012-01-27 MED ORDER — SERTRALINE HCL 25 MG PO TABS: 25.0000 mg | ORAL_TABLET | Freq: Every day | ORAL | Status: DC

## 2012-01-27 NOTE — Progress Notes (Signed)
AVS reviewed with pt and husband at bedside. Teach back method used. Pt able to verbalize understanding of AVS and questions answered. IV DC'd. Pt remains stable. Home medications returned to pt from pharmacy. RX's called in pt pharmacy. Husband called cab for transportation. Rolling walker delivered to pt for home use. Pt escorted out of facility via wheelchair. [place], Rosanna Randy

## 2012-01-27 NOTE — Discharge Summary (Signed)
Internal Medicine Teaching [facility] Discharge Note  Name: Sandra [name] MRN: [id number] DOB: 1961/01/20 51 y.o.  Date of Admission: 01/23/2012  4:31 PM Date of Discharge: 02/02/2012 Attending Physician: No att. providers found  Discharge Diagnosis: Active Problems:  Hypertension  Hyperlipidemia  Depression  Anxiety  Hypokalemia  Benzodiazepine dependence, continuous   Discharge Medications: Medication List  As of 02/02/2012  5:46 PM   STOP taking these medications         amLODipine 5 MG tablet      baclofen 10 MG tablet      lisinopril-hydrochlorothiazide 20-12.5 MG per tablet      metoprolol 50 MG tablet      potassium chloride 10 MEQ tablet         TAKE these medications         clonazePAM 1 MG tablet   Commonly known as: KLONOPIN   Take 0.5 tablets (0.5 mg total) by mouth 3 (three) times daily as needed for anxiety.      clopidogrel 75 MG tablet   Commonly known as: PLAVIX   Take 1 tablet (75 mg total) by mouth daily.      cyclobenzaprine 5 MG tablet   Commonly known as: FLEXERIL   Take 1 tablet (5 mg total) by mouth 2 (two) times daily as needed for muscle spasms.      famotidine 20 MG tablet   Commonly known as: PEPCID   Take 1 tablet (20 mg total) by mouth 2 (two) times daily.      HYDROcodone-acetaminophen 5-500 MG per tablet   Commonly known as: VICODIN   Take 1 tablet by mouth every 6 (six) hours as needed. For pain      metoprolol succinate 12.5 mg Tb24   Commonly known as: TOPROL-XL   Take 0.5 tablets (12.5 mg total) by mouth daily.      pravastatin 40 MG tablet   Commonly known as: PRAVACHOL   Take 1 tablet (40 mg total) by mouth daily.      sertraline 25 MG tablet   Commonly known as: ZOLOFT   Take 1 tablet (25 mg total) by mouth daily.      solifenacin 5 MG tablet   Commonly known as: VESICARE   Take 1 tablet (5 mg total) by mouth daily.      zolpidem 5 MG tablet   Commonly known as: AMBIEN   Take 1 tablet (5 mg total) by  mouth at bedtime as needed for sleep.            Disposition and follow-up:   [name] was discharged from [facility] in Stable condition.  At the hospital follow up visit please address the following issues  -klonopin dependence -assess blood pressure and titrate medications as necessary -CBC to check platelets, BMP  -Stress importance of psychiatry followup  Follow-up Appointments: Follow-up Information    Follow up with [facility] on 01/31/2012. (Be there at 9:00 am )    Contact information:   [address]  [place], [place] [address] [phone]      Follow up with Almyra Deforest, MD. (August 22 at 2:15 PM)    Contact information:   [address] [place] [address] [phone]         Discharge Orders    Future Appointments: Provider: Department: Dept Phone: Center:   02/07/2012 1:30 PM [facility] Rm [facility] [phone] [facility]   02/09/2012  2:15 PM Almyra Deforest, MD [facility] [phone] [facility]   02/16/2012 11:00 AM Meryl Dare, MD,FACG [facility] [phone] [facility]     Future Orders Please Complete By Expires   Diet - low sodium heart healthy      Increase activity slowly      Discharge instructions      Comments:   Please go to follow up appointments and take medication as prescribed.      Consultations:  Psychiatry  Procedures Performed:  Dg Chest 2 View  01/25/2012  *RADIOLOGY REPORT*  Clinical Data: Altered mental status, smoker  CHEST - 2 VIEW  Comparison: 08/12/2011  Findings: Borderline cardiomegaly noted.  There is elevation of the right hemidiaphragm.  Left basilar atelectasis or infiltrate is noted.  No pulmonary edema.  IMPRESSION: Elevation of the right hemidiaphragm.  No pulmonary edema.  Left basilar atelectasis or infiltrate.  Original Report Authenticated By: Natasha Mead, M.D.   Ct Head Wo Contrast  01/23/2012   *RADIOLOGY REPORT*  Clinical Data: Altered level of consciousness.  CT HEAD WITHOUT CONTRAST  Technique:  Contiguous axial images were obtained from the base of the skull through the vertex without contrast.  Comparison: Head CT 01/27/2011.  Findings: Remote right middle cerebral artery territory infarcts are demonstrated.  No acute hemispheric infarction or intracranial hemorrhage.  The ventricles are normal except for mild ex vacuo dilatation of the right lateral ventricle.  No extra-axial fluid collections.  No mass lesions.  The brainstem and cerebellum grossly normal and stable.  The bony structures are intact.  The paranasal sinuses and mastoid air cells are clear.  IMPRESSION:  Remote infarcts but no acute intracranial process or mass lesion.  Original Report Authenticated By: P. Loralie Champagne, M.D.    Admission HPI: Thorough HPI difficult to obtain from patient secondary confusional state.  The patient presented to the [facility] ED by ambulance, with complaints of confusion and hallucinations for the last 4 days. Patient states that she is "addicted" to baclofen and clonazepam, and that she does not feel well if she misses doses or does not take these medications regularly. She states that she ran out of her medications recently, and subsequently took her husband's zyprexa approximately 4 days ago, after which she developed confusion, hallucinations, and difficulty ambulating secondary to loss of balance.She denies any loss of consciousness associated with her falls (or any other LOC in the last 4 days), and complains that her falls have resulted in her having some mild back and left shoulder pain. Patient's husband was not present at the time of admission, but reported in the ED that the patient had ceased taking the zyprexa yesterday, that she was subsequently back to her baseline, but that the home health nurse that saw her this morning recommended that she come in to the ED. The patient has a  history of prior MI and prior right MCA stroke, but has never experienced a confusional state such as the one she presents with currently. Although the patient thinks she has been admitted to [facility] within the last couple days, her husband denied this to the ED physician.  Patient states that she smokes marijuana regularly, but denies any other illicit drug use. Denies use of PCP or cannabinoid analogues. Patient denies any current visual or auditory hallucinations.   Hospital Course by problem list: Active Problems:  Hypertension  Hyperlipidemia  Depression  Anxiety  Hypokalemia  Benzodiazepine dependence, continuous    Acute Encephalopathy: Patient came in  after being off klonopin and baclofen for one week and then ingesting approximately 40 of her husband's zyprexa. Patient had tremors, akasthesias, appeared to have hallucinations and was disoriented to place and time. Her vitals were stable, Head CT was performed and negative for acute process. Ammonia, blood ETOH level WNL. UDS + for THC, UA was negative for UTI or blood. CXR without evidence of PNA. Patient's mental status improved on day 3 of admission and she appeared to be back to baseline per husband. At that time, the patient admitted to taking the pills. She denies trying to hurt or kills herself, she just wanted help sleeping. Psychiatry was consulted and recommended outpatient therapy and insomnia treatment. She was observed for another day and remained alert and oriented. She agreed to start on anti-depressive therapy and follow up closely with psych and her PCP. At the time of discharge, patient was at baseline mental status, denied suicidal ideations.   Hypotension/Bradycardia: patient had intermittent bradycardia on telemetry and EKGs as well as intermittent hypotension to 90s systolic. Her home blood pressure medication was held for several days with the exception of metoprolol as patient has a history of CAD. On discharge,  her blood pressure was 104/67 and HR was in the 60's. It was decided that the patient's home blood pressure medications should be held until Hospital followup appointment where it can be reassessed.  AKI: Cr was 1.9 on admission, likely pre-renal due to decreased PO intake before admission. AKI resolved with fluids. On discharge her Cr was 0.78.  Electrolyte disturbances: Patient's K was 2.7, Ns was 149 the first day of admission. K was repleted and patient's Na normalized after 1/2 NS. Patient's electrolytes remained WNL until discharge.  Depression/anxiety Patient admits to using more than the prescribed amount of her home Klonopin. She is at high risk for addiction to benzodiazepines.Patient endorses a long history of depression and anxiety, though she currently denies depressed mood. She denies taking her husband's medication to try to harm or kill herself. Throughout her hospitalization, she denied suicidal ideations. Patient has been on multiple antidepressants in the past. She states that she is agreeable to trying Zoloft and she understands that it takes a minimum of 3 weeks in her to see potential effects of this medication. Reinforced the importance of following up with psychiatry to titrate the dose of Zoloft and assess for efficacy.   Muscle spasms Patient came in on home baclofen, but she admits to taking more than the prescribed amount. We discussed that this is not an ideal long-term medication. She stated agreement with starting Flexeril on a PRN basis only rather than taking baclofen 3 times a day.  Normocytic anemia and mild thrombocytopenia On addition, patient's hemoglobin was 10 and throughout her stay it ranged from 10-12. There was no evidence of active bleeding. No melena, no hematemesis, no vaginal bleeding. Platelets ranged from 119-136 during her admission. Patient to followup as outpatient for her normocytic anemia and low platelets. Recommend followup CBC.  Discharge  Vitals:  BP 104/67  Pulse 69  Temp 98.4 F (36.9 C) (Oral)  Resp 18  Ht 5\' 5"  (1.651 m)  Wt 187 lb 13.3 oz (85.2 kg)  BMI 31.26 kg/m2  SpO2 98%  Discharge Labs:  No results found for this or any previous visit (from the past 24 hour(s)).  Signed: Denton Ar 02/02/2012, 5:46 PM   Time Spent on Discharge: 30 min

## 2012-01-27 NOTE — Progress Notes (Signed)
INTERNAL MEDICINE TEACHING SERVICE Attending Note  Date: 01/27/2012  Patient name: Sandra [name]  Medical record number: [id number]  Date of birth: [birth year]    This patient has been seen and discussed with the house staff. Please see their note for complete details. I concur with their findings with the following additions/corrections: Feels well this morning. She denies dizziness, CP, SOB. She states the discomfort in her legs is improved and almost resolved. RN reports HR in low 60's this morning. She is eager to go home.   A/P: Patient is a 51 y.o., female with a PMHX significant for CAD, HTN, glucose intolerance, depression, hx MCA CVA, anxiety disorder, GERD, hx substance abuse, presented with confusion and altered mental status. 1) Acute encephalopathy: This is resolved. She reports taking over 40 tablets of Zyprexa over a few days due to running out of her medications. She denies any suicidal ideation or intent to harm herself. I educated her on toxicity effects from her medications as well as not to take her husbands medication. She has underlying depression and anxiety. I would start her on Zoloft 25 mg daily, which she is willing to try. I would decrease her klonopin to 0.5 mg po tid. In addition, I would stop her baclofen and give her flexeril instead at 5 mg po tid PRN as needed for spasms 2) Hypotension/bradycardia: There was a questionable report of HR in 30's this morning, but HR recheck manually was in low 60's. She had SBP in 80's yesterday, but this improved after IVF and holding her medications. I would only continue Toprol XL at 12.5 mg po daily. EKG reviewed with frequent PVC's, she is asymptomatic. No significant interval prolongations noted. 3) AKI: Resolved. Suspect she may have had hypotension leading to this. 4) PT recs, needs walker. 5) D/C home with PCP and psych follow up, patient is agreeable to this plan.  -rest per resident physician note.    Dominic Pea, DO    01/27/2012, 2:52 PM

## 2012-01-27 NOTE — Progress Notes (Signed)
Subjective:  Sandra [name] is alert and sitting in her chair. She says she slept well last night after taking sleeping aid. No sleep walking or other side effects. She is asking when she can go home. She is still having some foot pain, but says she was able to walk well with a walker with PT and says she would be able to ambulate at home with assistance.  She denies headache, visual disturbances, visual and auditory hallucinations. No abdominal pain, dysuria, chest pain, SOB.   Objective: Vital signs in last 24 hours: Filed Vitals:   01/26/12 1405 01/26/12 1800 01/26/12 2100 01/27/12 0508  BP: 100/58 104/54 103/61 110/55  Pulse: 79 77 80 68  Temp: 99 F (37.2 C) 98.9 F (37.2 C) 99.2 F (37.3 C) 98.3 F (36.8 C)  TempSrc: Oral Oral Oral Oral  Resp: 20 20 18 18   Height:      Weight:   187 lb 13.3 oz (85.2 kg)   SpO2: 96% 95% 99% 97%   Weight change: 6 lb 6.9 oz (2.917 kg)  Intake/Output Summary (Last 24 hours) at 01/27/12 0745 Last data filed at 01/26/12 1700  Gross per 24 hour  Intake   1880 ml  Output      0 ml  Net   1880 ml   Physical Exam Blood pressure 110/55, pulse 68, temperature 98.3 F (36.8 C), temperature source Oral, resp. rate 18, height 5\' 5"  (1.651 m), weight 187 lb 13.3 oz (85.2 kg), SpO2 97.00%. General: vitals reviewed. Patient is alert and oriented x3 HEENT:  Anicteric, PERRL, EOMI, no lymphadenopathy, moist mucous moist Cardiovascular:  Regular rate and rhythm, no murmurs, rubs or gallops Respiratory:  Clear to auscultation bilaterally, no wheezes, rales, or rhonchi Abdomen:  Soft, nondistended, nontender, normoactive bowel sounds Extremities:  UE with spastic rigidity, hyperreflexic b/l, symmetric. RUE trength 2/5, LUE 1/5. Contractures of hands b/l. Warm and well-perfused, no clubbing, cyanosis, or edema. LE strength intact.  No twitching of extremities, no fidgeting. Skin: Warm, dry, multiple scabs and old scars on LE Neuro: Patient is alert and  conversant. Oriented x3. Memory intact. No tremors. Cranial nerves in tact.   Lab Results: Basic Metabolic Panel:  Lab 01/27/12 1610 01/26/12 0550 01/24/12 0210  NA 142 138 --  K 4.2 4.0 --  CL 111 108 --  CO2 22 20 --  GLUCOSE 102* 103* --  BUN 14 12 --  CREATININE 0.78 0.82 --  CALCIUM 8.8 8.8 --  MG -- -- 2.2  PHOS -- -- --   Liver Function Tests:  Lab 01/24/12 1527 01/24/12 0210  AST 15 13  ALT 12 9  ALKPHOS 73 67  BILITOT 0.5 0.5  PROT 6.7 6.1  ALBUMIN 3.6 3.4*    Lab 01/24/12 0211  AMMONIA 20   CBC:  Lab 01/26/12 0550 01/25/12 0535  WBC 7.2 7.7  NEUTROABS -- --  HGB 10.9* 11.7*  HCT 31.8* 34.1*  MCV 89.6 90.2  PLT 122* 133*   Cardiac Enzymes:  Lab 01/26/12 1025  CKTOTAL 91  CKMB --  CKMBINDEX --  TROPONINI --   CBG:  Lab 01/23/12 2356  GLUCAP 126*   Hemoglobin A1C:  Lab 01/24/12 0210  HGBA1C 5.9*   Urine Drug Screen: Drugs of Abuse     Component Value Date/Time   LABOPIA NONE DETECTED 01/23/2012 1843   COCAINSCRNUR NONE DETECTED 01/23/2012 1843   LABBENZ NONE DETECTED 01/23/2012 1843   AMPHETMU NONE DETECTED 01/23/2012 1843   THCU POSITIVE* 01/23/2012  1843   LABBARB NONE DETECTED 01/23/2012 1843    Alcohol Level:  Lab 01/23/12 1749  ETH <11   Urinalysis:  Lab 01/25/12 1535 01/23/12 1843  COLORURINE YELLOW AMBER*  LABSPEC 1.014 1.023  PHURINE 5.5 5.0  GLUCOSEU NEGATIVE NEGATIVE  HGBUR NEGATIVE NEGATIVE  BILIRUBINUR SMALL* MODERATE*  KETONESUR NEGATIVE 15*  PROTEINUR NEGATIVE NEGATIVE  UROBILINOGEN 1.0 1.0  NITRITE NEGATIVE NEGATIVE  LEUKOCYTESUR NEGATIVE SMALL*    Micro Results: Recent Results (from the past 240 hour(s))  CULTURE, BLOOD (ROUTINE X 2)     Status: Normal (Preliminary result)   Collection Time   01/25/12 12:10 PM      Component Value Range Status Comment   Specimen Description BLOOD ARM RIGHT   Final    Special Requests BOTTLES DRAWN AEROBIC ONLY 10CC   Final    Culture  Setup Time 01/25/2012 22:12   Final     Culture     Final    Value:        BLOOD CULTURE RECEIVED NO GROWTH TO DATE CULTURE WILL BE HELD FOR 5 DAYS BEFORE ISSUING A FINAL NEGATIVE REPORT   Report Status PENDING   Incomplete   CULTURE, BLOOD (ROUTINE X 2)     Status: Normal (Preliminary result)   Collection Time   01/25/12 12:15 PM      Component Value Range Status Comment   Specimen Description BLOOD HAND RIGHT   Final    Special Requests BOTTLES DRAWN AEROBIC ONLY 10CC   Final    Culture  Setup Time 01/25/2012 22:12   Final    Culture     Final    Value:        BLOOD CULTURE RECEIVED NO GROWTH TO DATE CULTURE WILL BE HELD FOR 5 DAYS BEFORE ISSUING A FINAL NEGATIVE REPORT   Report Status PENDING   Incomplete   URINE CULTURE     Status: Normal   Collection Time   01/25/12  3:35 PM      Component Value Range Status Comment   Specimen Description URINE, CATHETERIZED   Final    Special Requests NONE   Final    Culture  Setup Time 01/26/2012 01:55   Final    Colony Count NO GROWTH   Final    Culture NO GROWTH   Final    Report Status 01/26/2012 FINAL   Final    Studies/Results: Dg Chest 2 View  01/25/2012  *RADIOLOGY REPORT*  Clinical Data: Altered mental status, smoker  CHEST - 2 VIEW  Comparison: 08/12/2011  Findings: Borderline cardiomegaly noted.  There is elevation of the right hemidiaphragm.  Left basilar atelectasis or infiltrate is noted.  No pulmonary edema.  IMPRESSION: Elevation of the right hemidiaphragm.  No pulmonary edema.  Left basilar atelectasis or infiltrate.  Original Report Authenticated By: Natasha Mead, M.D.   Medications:  Scheduled Meds:    . sodium chloride   Intravenous Once  . amLODipine  5 mg Oral Daily  . bisacodyl  10 mg Rectal Once  . clonazePAM  1 mg Oral TID  . clopidogrel  75 mg Oral Q breakfast  . enoxaparin (LOVENOX) injection  40 mg Subcutaneous Q24H  . metoprolol  25 mg Oral BID  . simvastatin  20 mg Oral q1800  . DISCONTD: baclofen  5 mg Oral TID  . DISCONTD: metoprolol  25 mg Oral BID    Continuous Infusions:    . DISCONTD: dextrose 5 % and 0.45% NaCl 1,000 mL with potassium chloride  20 mEq infusion Stopped (01/26/12 1559)   PRN Meds:. Assessment/Plan:  Patient is a 51 yo female with a history of anxiety/depression, CAD, CVA 2012, HTN, HLD. She presents with altered mental status 1 week after running out of her medication. Husband says 40 pills of his Zyprexa are missing.  Zyprexa ingestion/toxicity - Resolved. She is alert and oriented, husband says she is back to baseline. Patient admits to taking about 40 pills of her husbands zyprexa over several days because she couldn't sleep. No SI. Akathisia, tremors, altered mental status resolved. EKG without QT prolongation or dysrhythmia. Patient had HR in low 60's today.  -resolved, patient back at baseline -appreciate psych recommendations -patient will need to follow up with psychiatry as well as her PCP  Insomnia -patient likely with untreated sleep disorder -will give Ambien as patient said it worked well last night, no side effects noted -discussed that sleep aid should be take only as needed and only as prescribed  Depression/Anxiety -patient with racing thoughts, abusing Klonopin and history of BZD addiction -low dose klonopin and consider tapering this medication as outpatient -will give Rx for Zoloft, which she is willing to try, discussed that it take minimum 3 weeks to take effect -Effexor with painful withdrawal symptoms and as patient came in after not refilling her medications, this doesn't seem like the best choice for her -f/u with psych for zoloft titration  Foot Pain -history of falls while on zyprexa, likely bruising and MSK pain -doubt DVT/PE given patient is not tachycardic or requiring oxygen -PT at home, as previously set up  Muscle Spasms - patient has had these every since her stroke -patient on home baclofen but she admits to taking more than prescribed -will give flexeril on PRN basis  only, rather than TID baclofen -discussed this is not a long term medication  Hypotension -home BP meds held the past 2 days due to low BP. Asymptomatic. -will hold all BP meds on discharge except metoprolol and recheck as outpatient  Hypokalemia: Resolved  Hypernatremia - Resolved, patient appears euvolemic  Acute Kidney Injury - Resolved  Normocytic Anemia -f/u as outpatient  History of stroke -cont Plavix  CAD -cont home statin, low dose metoprolol  Nutrition -heart diet  PPX -lovenox  Dispo -patient is at baseline mental status -stable for dc home with psych and clinic f/u    LOS: 4 days   Denton Ar 01/27/2012, 7:45 AM

## 2012-01-27 NOTE — Consult Note (Signed)
Clinical Social Work Progress Note PSYCHIATRY SERVICE LINE 01/27/2012  Patient:  Sandra [name]  Account:  [id number]  Admit Date:  01/23/2012  Clinical Social Worker:  Ashley Jacobs, LCSW  Date/Time:  01/27/2012 10:57 AM  Review of Patient  Overall Medical Condition:   Patient is doing much better per her report. She is engaged in conversation, anxious to leave and get home, and reports she is bored in the room.  Patient asks several times if she is able to leave or atleast go outside and smoke a cigarette.  Patient husband also in the room and was able to participate in conversation per patient's permission.  Discussed patient's dc plans with patient returning home with husband and reliable transportation to appointment.  Discussed with patient options of appointments, and husband reports he follows at [facility] in [facility].  Patient agreeable to follow up for medication management and counseling.  Discussed other alternative options as well and placed all information on DC paperwork.  Called [facility] and appointment is scheduled for  Tuesday August 13th at 9:00am. Patient will sign outpatient follow up recommendation contract and given her medicaid number for follow up.   Participation Level:  Active  Participation Quality  Appropriate   Other Participation Quality:   Husband also in room and active with participation   Affect  Anxious  Excited   Cognitive  Appropriate   Reaction to Medications/Concerns:   Patient reports none   Modes of Intervention  Education  Behaviors/Psychosis   Summary of Progress/Plan at Discharge   patient to follow up with [facility] in [facility] for mental health appointment. She is agreeable. To DC home when medically stable and husband is aware and agreeable.  All information placed on DC paperwork and safety contract/outpatient follow up copy given to patient and placed in chart.  No safety concerns noted at this time. She is not SI, HI, or displaying  psychosis.  Patient is engaged in conversation and does not appear to be depressed. She is anxious to leave the hospital and get home for the weekend.     Ashley Jacobs, MSW LCSW [phone]

## 2012-01-27 NOTE — Progress Notes (Signed)
01/27/2012 10:39 AM Spoke with resident on call for the patient, who confirmed it was okay to dc suicide sitter.  RN made aware.   Sandra [name]

## 2012-01-27 NOTE — Evaluation (Signed)
Physical Therapy Evaluation Patient Details Name: Sandra [name] MRN: [id number] DOB: [birth year] Today's Date: 01/27/2012 Time: 4098-1191 PT Time Calculation (min): 19 min  PT Assessment / Plan / Recommendation Clinical Impression  Patient is a 51 yo female admitted with AMS.  Patient with pain in bil. feet impacting tolerance to activity.  Should progress well once pain under control.  Recommend HHPT, RW, and shower chair for discharge.  Will continue acute PT for mobility/gait training and education.    PT Assessment  Patient needs continued PT services    Follow Up Recommendations  Home health PT;Supervision/Assistance - 24 hour    Barriers to Discharge        Equipment Recommendations  Rolling walker with 5" wheels;Tub/shower seat    Recommendations for Other Services     Frequency Min 3X/week    Precautions / Restrictions Precautions Precautions: Fall Restrictions Weight Bearing Restrictions: No   Pertinent Vitals/Pain Pain in bil. Feet impacting ambulation distance.      Mobility  Bed Mobility Bed Mobility: Rolling Left;Left Sidelying to Sit;Sitting - Scoot to Edge of Bed Rolling Left: 5: Supervision;With rail Left Sidelying to Sit: 4: Min assist;With rails Sitting - Scoot to Edge of Bed: 5: Supervision Details for Bed Mobility Assistance: Min assist moving to sitting to raise trunk from bed. Transfers Transfers: Sit to Stand;Stand to Sit Sit to Stand: 4: Min assist;With upper extremity assist;From bed Stand to Sit: 4: Min guard;With upper extremity assist;With armrests;To chair/3-in-1 Details for Transfer Assistance: Min assist due to decreased balance for safety.  Verbal cues for safe hand placement. Ambulation/Gait Ambulation/Gait Assistance: 5: Supervision Ambulation Distance (Feet): 26 Feet Assistive device: Rolling walker Ambulation/Gait Assistance Details: Pain in feet limiting gait distance.  Patient instructed in safe use of RW. Gait Pattern: Step-to  pattern;Decreased stride length    Exercises     PT Diagnosis: Difficulty walking;Acute pain;Generalized weakness  PT Problem List: Decreased strength;Decreased activity tolerance;Decreased balance;Decreased mobility;Decreased knowledge of use of DME;Pain PT Treatment Interventions: DME instruction;Gait training;Stair training;Functional mobility training;Balance training;Patient/family education   PT Goals Acute Rehab PT Goals PT Goal Formulation: With patient/family Time For Goal Achievement: 02/03/12 Potential to Achieve Goals: Good Pt will go Supine/Side to Sit: Independently;with HOB 0 degrees PT Goal: Supine/Side to Sit - Progress: Goal set today Pt will go Sit to Supine/Side: Independently;with HOB 0 degrees PT Goal: Sit to Supine/Side - Progress: Goal set today Pt will go Sit to Stand: with supervision;with upper extremity assist PT Goal: Sit to Stand - Progress: Goal set today Pt will go Stand to Sit: with supervision;with upper extremity assist PT Goal: Stand to Sit - Progress: Goal set today Pt will Ambulate: 51 - 150 feet;with supervision;with least restrictive assistive device PT Goal: Ambulate - Progress: Goal set today Pt will Go Up / Down Stairs: 3-5 stairs;with min assist;with least restrictive assistive device PT Goal: Up/Down Stairs - Progress: Goal set today  Visit Information  Last PT Received On: 01/27/12 Assistance Needed: +1    Subjective Data  Subjective: "I would lose my balance and fall" Patient Stated Goal: To return home   Prior Functioning  Home Living Lives With: Spouse Available Help at Discharge: Family;Available 24 hours/day Type of Home: Apartment Home Access: Stairs to enter Entrance Stairs-Number of Steps: 6 Entrance Stairs-Rails: Right Home Layout: One level Bathroom Shower/Tub: Health visitor: Standard Home Adaptive Equipment: None Prior Function Level of Independence: Independent Able to Take Stairs?:  Yes Driving: No Communication Communication: No difficulties  Cognition  Overall Cognitive Status: Appears within functional limits for tasks assessed/performed Arousal/Alertness: Awake/alert Orientation Level: Appears intact for tasks assessed Behavior During Session: [facility] for tasks performed Cognition - Other Comments: Slow to process information at times.    Extremity/Trunk Assessment Right Upper Extremity Assessment RUE ROM/Strength/Tone: [facility] for tasks assessed Left Upper Extremity Assessment LUE ROM/Strength/Tone: Deficits LUE ROM/Strength/Tone Deficits: Decreased strength 4-/5; increased tone Right Lower Extremity Assessment RLE ROM/Strength/Tone: WFL for tasks assessed RLE Sensation: WFL - Light Touch Left Lower Extremity Assessment LLE ROM/Strength/Tone: Deficits LLE ROM/Strength/Tone Deficits: Decreased strength 4-/5 LLE Sensation: WFL - Light Touch   Balance    End of Session PT - End of Session Equipment Utilized During Treatment: Gait belt Activity Tolerance: Patient limited by pain Patient left: in chair;with call bell/phone within reach;with family/visitor present Nurse Communication: Mobility status  GP     Vena Austria 01/27/2012, 5:08 PM Durenda Hurt. Renaldo Fiddler, [facility] Acute Rehab Services Pager [phone]

## 2012-01-30 ENCOUNTER — Telehealth: Payer: Self-pay | Admitting: *Deleted

## 2012-01-30 NOTE — Telephone Encounter (Signed)
Pt calls and states she just was discharged and was taken off all her bp meds and put on only toprol, she states her feet are now so swollen that they are painful, denies short of breath, h/a, chest pain, desires to be seen, must have transportation, appt 8/13 1015 dr Dierdre Searles

## 2012-01-31 ENCOUNTER — Encounter: Payer: Self-pay | Admitting: Internal Medicine

## 2012-01-31 ENCOUNTER — Ambulatory Visit (INDEPENDENT_AMBULATORY_CARE_PROVIDER_SITE_OTHER): Payer: Medicaid Other | Admitting: Internal Medicine

## 2012-01-31 VITALS — BP 102/72 | HR 87 | Temp 98.0°F | Ht 65.5 in | Wt 177.5 lb

## 2012-01-31 DIAGNOSIS — F3289 Other specified depressive episodes: Secondary | ICD-10-CM

## 2012-01-31 DIAGNOSIS — F329 Major depressive disorder, single episode, unspecified: Secondary | ICD-10-CM

## 2012-01-31 DIAGNOSIS — M109 Gout, unspecified: Secondary | ICD-10-CM

## 2012-01-31 LAB — CULTURE, BLOOD (ROUTINE X 2): Culture: NO GROWTH

## 2012-01-31 MED ORDER — NAPROXEN 500 MG PO TABS: 500.0000 mg | ORAL_TABLET | Freq: Two times a day (BID) | ORAL | Status: DC

## 2012-01-31 NOTE — Progress Notes (Signed)
Subjective:   [name] female   DOB: August 12, 1960 51 y.o.   MRN: [id number]  HPI: Sandra [name] is a 51 y.o. Caucasain woman pmh HTN, HLD, CAD, CVA 2012, depression, and anxiety p/w new right unilateral foot pain and swelling. Pt was recently hospitalized on 8/5-01/27/12 for benzo OD and during that time had some hypotension and ARF that are both resolved. Pt reports feeling better since being home but on Sunday noticed intense pain in her right foot that has only gotten worse until today to the point that she doesn't ambulate on it. She denied any f/c, n/v/d, weight loss, SOB, CP, calf tenderness, or leg pain. She is worried that because of stopping her BP meds from d/c from the hospital that must be the cause of her swelling. She denied any HA, blurry vision, palpitations, SOB or CP. She does have some decreased appetite but from lack of motivation to eat and pain of her foot. She reports her mood being more stable and is not taking her husband's medications, "it was just stupid." Pt missed her behavioral health appt scheduled today 2/2 to the pain and transportation factors. She will reschedule and understands its importance to her health. She has been compliant with her medications since discharge with no other changes made. She was not started on any other new medications.     Past Medical History  Diagnosis Date  . PFO (patent foramen ovale) August 2012    PFO seen on TEE during hospitalization in 01/2011.  Patient to f/u with Dr. Pearlean Brownie, neurology, for enrollment in trial for medical treatment of PFO  . CAD (coronary artery disease) 2007    istory of MI with stent in 2007 by Dr. Park Breed, [facility]. Stent placement by Dr Karoline Caldwell   . Prediabetes 03/07/2011  . Hypertension 03/07/2011  . Hyperlipidemia 03/07/2011  . Tobacco abuse 03/07/2011  . Depression 03/07/2011  . Psoriasis 03/07/2011  . Acute right MCA stroke 01/27/11    Acute R MCA stroked  with L arm and leg weakness  .  Anxiety 1980s    On Klonipin since age 51. Had addiction problem with Xanax which  Was therefore d/c . Seen in the past at [facility].   . Fractured toe 01/2011    History of left fifth toe proximal phalanx fracture when patient had a stroke (01/2011) and fell. Seen by Dr Victorino Dike.   . Incontinence 04/14/2011  . Arthritis 04/16/2011  . Myocardial infarct, old   . Tobacco abuse 03/07/2011    Quit 01/2011   . Fracture of fifth toe, left, closed 01/28/2011    History of left fifth toe proximal phalanx fracture when patient had a stroke (01/2011) and fell. Seen by Dr Victorino Dike.    Marland Kitchen GERD (gastroesophageal reflux disease)   . Substance abuse     h/o narcotic abuse pt denies as of 01/09/12   Current Outpatient Prescriptions  Medication Sig Dispense Refill  . clonazePAM (KLONOPIN) 1 MG tablet Take 0.5 tablets (0.5 mg total) by mouth 3 (three) times daily as needed for anxiety.  60 tablet  0  . clopidogrel (PLAVIX) 75 MG tablet Take 1 tablet (75 mg total) by mouth daily.  30 tablet  0  . cyclobenzaprine (FLEXERIL) 5 MG tablet Take 1 tablet (5 mg total) by mouth 2 (two) times daily as needed for muscle spasms.  30 tablet  0  . famotidine (PEPCID) 20 MG tablet Take 1 tablet (20 mg total) by mouth 2 (  two) times daily.  60 tablet  2  . metoprolol succinate (TOPROL-XL) 12.5 mg TB24 Take 0.5 tablets (12.5 mg total) by mouth daily.  30 tablet  1  . pravastatin (PRAVACHOL) 40 MG tablet Take 1 tablet (40 mg total) by mouth daily.  30 tablet  11  . sertraline (ZOLOFT) 25 MG tablet Take 1 tablet (25 mg total) by mouth daily.  30 tablet  4  . solifenacin (VESICARE) 5 MG tablet Take 1 tablet (5 mg total) by mouth daily.  30 tablet  2  . zolpidem (AMBIEN) 5 MG tablet Take 1 tablet (5 mg total) by mouth at bedtime as needed for sleep.  30 tablet  0  . HYDROcodone-acetaminophen (VICODIN) 5-500 MG per tablet Take 1 tablet by mouth every 6 (six) hours as needed. For pain      . naproxen (NAPROSYN) 500 MG tablet Take 1  tablet (500 mg total) by mouth 2 (two) times daily with a meal. First dose take 1000mg . Continue 500mg  twice for 7 days  20 tablet  1   No family history on file. History   Social History  . Marital Status: Married    Spouse Name: N/A    Number of Children: N/A  . Years of Education: N/A   Social History Main Topics  . Smoking status: Current Everyday Smoker -- 1.5 packs/day    Types: Cigarettes    Last Attempt to Quit: 01/26/2011  . Smokeless tobacco: None   Comment: restarted.  Cutting back  . Alcohol Use: No  . Drug Use: No  . Sexually Active: Yes   Other Topics Concern  . None   Social History Narrative  . None   Review of Systems: pertinent listed in HPI  Objective:  Physical Exam: Filed Vitals:   01/31/12 1030  BP: 102/72  Pulse: 87  Temp: 98 F (36.7 C)  TempSrc: Oral  Height: 5' 5.5" (1.664 m)  Weight: 177 lb 8 oz (80.513 kg)  SpO2: 99%   General: NAD, well nourished HEENT: PERRL, EOMI, no scleral icterus Cardiac: RRR, no rubs, murmurs or gallops Pulm: clear to auscultation bilaterally, moving normal volumes of air Abd: soft, nontender, nondistended, BS present Ext: warm and well perfused, no pedal edema, no calf tenderness, right unilateral first metatarsophalangeal joint swollen with some erythema moving laterally across digits, no fluctuance, no induration, only slightly warm, 2+pulses dorsalis pedis bilaterally Neuro: alert and oriented X3, cranial nerves II-XII grossly intact  Assessment & Plan:  Pt was seen and evaluated with Dr. Josem Kaufmann.  Pt will f/u in 1-2 wks 2/2 transportation issues for presumed gout flare.

## 2012-01-31 NOTE — Assessment & Plan Note (Signed)
Pt presented acutely for some right foot swelling and intense pain. She was recently hospitalized and suffered ARF that is now resolved. She has a brother with gout. She has had no personal hx of gout and is post-menopausal at this time. PE more concerning for an acute gout flare vs. Cellulitis. Pt has no other constitutional sx and is afebrile today. Pt refused tapping joint for confirmation of gout. Given acute stress and renal failure will treat for presumed gout and monitor progress.  - Naproxen 1000mg  first dose and then 500 mg BID for 7 days -f/u in 1-2 wks to re-evaluate -advised pt that if symptoms worsen or develops fever should seek medical attention

## 2012-01-31 NOTE — Patient Instructions (Signed)
I am glad you are doing better after your recent hospitalization.   Your foot pain and swelling is thought to be from gout. We will give you a medication called Naprosyn or naproxen to take for the pain. One the first dose please take 1000mg  once you get the medication and then 500 mg later at night. For the next 7 days please take 500mg  in the morning and 500 mg at night. Please take the medication with food.   If you develop a fever or feel worse don't hesitate to be seen again or call the clinic.   Please reschedule your appointment with [facility] in [place], [place]. It will be very important to you feeling better.   It was a pleasure meeting you and your husband. Hope you feel better soon.  Thank you.

## 2012-01-31 NOTE — Assessment & Plan Note (Signed)
Pt mood improved since recent d/c from hospital. Didn't make f/u mental health appt 2/2 foot pain. Counseled pt on importance of this on health and discussion of recent hospitalization events. -pt will reschedule and realizes/believes in importance of this visit -continued medications w/o changes

## 2012-01-31 NOTE — Progress Notes (Signed)
INTERNAL MEDICINE TEACHING ATTENDING ADDENDUM - Karren Cobble, MD: I personally saw and evaluated Ms. Price in this clinic visit in conjunction with the resident, Dr. Algis Liming. I have discussed the patient's plan of care with Dr. Algis Liming during this visit. I have confirmed the physical exam findings and have read and agree with the clinic note including the plan.  Her current diagnosis is presumed gout.  We will re-evaluate in 2 weeks (transportation issues preclude a sooner re-evaluation).  In 1 or more months (2 weeks is too soon) we will check a uric acid level while she is stable and outside of the acute flare window.  We will then consider the initiation of allopurinol therapy, in conjunction with colchicine to prevent exacerbating the gout, and titrating to a uric acid less than 6.0.  Once the uric acid is less than 6.0 we will discontinue the colchicine soon thereafter.

## 2012-02-03 ENCOUNTER — Encounter: Payer: Self-pay | Admitting: Gastroenterology

## 2012-02-03 ENCOUNTER — Telehealth: Payer: Self-pay | Admitting: *Deleted

## 2012-02-03 NOTE — Telephone Encounter (Signed)
Pt. On Plavix needs an office visit instead of direct colonoscopy

## 2012-02-03 NOTE — Discharge Summary (Signed)
INTERNAL MEDICINE TEACHING SERVICE Attending Note  Date: 02/03/2012  Patient name: Sandra [name]  Medical record number: [id number]  Date of birth: 1961-01-31    This patient has been seen and discussed with the house staff. Please see their note for complete details. I concur with their findings and plan. See my note on day of discharge.   Dominic Pea, DO  02/03/2012, 8:38 AM

## 2012-02-06 ENCOUNTER — Encounter: Payer: Medicaid Other | Admitting: Internal Medicine

## 2012-02-09 ENCOUNTER — Encounter: Payer: Medicaid Other | Admitting: Internal Medicine

## 2012-02-15 ENCOUNTER — Ambulatory Visit (INDEPENDENT_AMBULATORY_CARE_PROVIDER_SITE_OTHER): Payer: Medicaid Other | Admitting: Internal Medicine

## 2012-02-15 ENCOUNTER — Encounter: Payer: Self-pay | Admitting: Internal Medicine

## 2012-02-15 VITALS — BP 158/90 | HR 59 | Temp 97.4°F | Ht 65.5 in | Wt 177.0 lb

## 2012-02-15 DIAGNOSIS — Z8673 Personal history of transient ischemic attack (TIA), and cerebral infarction without residual deficits: Secondary | ICD-10-CM

## 2012-02-15 DIAGNOSIS — R197 Diarrhea, unspecified: Secondary | ICD-10-CM

## 2012-02-15 DIAGNOSIS — F17200 Nicotine dependence, unspecified, uncomplicated: Secondary | ICD-10-CM

## 2012-02-15 DIAGNOSIS — Z720 Tobacco use: Secondary | ICD-10-CM

## 2012-02-15 DIAGNOSIS — I10 Essential (primary) hypertension: Secondary | ICD-10-CM

## 2012-02-15 DIAGNOSIS — M109 Gout, unspecified: Secondary | ICD-10-CM

## 2012-02-15 DIAGNOSIS — Z299 Encounter for prophylactic measures, unspecified: Secondary | ICD-10-CM

## 2012-02-15 DIAGNOSIS — I63511 Cerebral infarction due to unspecified occlusion or stenosis of right middle cerebral artery: Secondary | ICD-10-CM

## 2012-02-15 MED ORDER — CYCLOBENZAPRINE HCL 10 MG PO TABS: 10.0000 mg | ORAL_TABLET | Freq: Every day | ORAL | Status: DC | PRN

## 2012-02-15 MED ORDER — AMLODIPINE BESYLATE 5 MG PO TABS: 5.0000 mg | ORAL_TABLET | Freq: Every day | ORAL | Status: DC

## 2012-02-15 NOTE — Progress Notes (Signed)
Subjective:   [name] female   DOB: Oct 10, 1960 51 y.o.   MRN: [id number]  HPI: [name] is a 51 y.o. female with PMH significant as outlined below who presented to the clinic for a regular follow up.  She reports that she has been having diarrhea, which started yesterday. It is loose/watery/non-bloody stool, every couple of hours . Noted some mild nausea but no vomiting. Denies any fevers or chills. Denies any sick contact, recent travel or diet change.   Gout: Patient noted after taking naproxen the swelling in her right toe went away. But she feels some pain in the toe.   Tobacco abuse: patient is currently smoking 1 1/2 packs a day.   Hotflashes: patient was complaining about hot flashes  Mental health: she missed an appointment since she was in the hospital. I advised her to call and reschedule. She agreed she will do it tomorrow.  Colonoscopy: Scheduled for September.   Past Medical History  Diagnosis Date  . PFO (patent foramen ovale) August 2012    PFO seen on TEE during hospitalization in 01/2011.  Patient to f/u with Dr. Pearlean Brownie, neurology, for enrollment in trial for medical treatment of PFO  . CAD (coronary artery disease) 2007    istory of MI with stent in 2007 by Dr. Park Breed, [facility]. Stent placement by Dr Karoline Caldwell   . Prediabetes 03/07/2011  . Hypertension 03/07/2011  . Hyperlipidemia 03/07/2011  . Tobacco abuse 03/07/2011  . Depression 03/07/2011  . Psoriasis 03/07/2011  . Acute right MCA stroke 01/27/11    Acute R MCA stroked  with L arm and leg weakness  . Anxiety 1980s    On Klonipin since age 48. Had addiction problem with Xanax which  Was therefore d/c . Seen in the past at [facility].   . Fractured toe 01/2011    History of left fifth toe proximal phalanx fracture when patient had a stroke (01/2011) and fell. Seen by Dr Victorino Dike.   . Incontinence 04/14/2011  . Arthritis 04/16/2011  . Myocardial infarct, old   . Tobacco abuse  03/07/2011    Quit 01/2011   . Fracture of fifth toe, left, closed 01/28/2011    History of left fifth toe proximal phalanx fracture when patient had a stroke (01/2011) and fell. Seen by Dr Victorino Dike.    Marland Kitchen GERD (gastroesophageal reflux disease)   . Substance abuse     h/o narcotic abuse pt denies as of 01/09/12   Current Outpatient Prescriptions  Medication Sig Dispense Refill  . amLODipine (NORVASC) 5 MG tablet Take 1 tablet (5 mg total) by mouth daily.  30 tablet  0  . clonazePAM (KLONOPIN) 1 MG tablet Take 0.5 tablets (0.5 mg total) by mouth 3 (three) times daily as needed for anxiety.  60 tablet  0  . clopidogrel (PLAVIX) 75 MG tablet Take 1 tablet (75 mg total) by mouth daily.  30 tablet  0  . cyclobenzaprine (FLEXERIL) 10 MG tablet Take 1 tablet (10 mg total) by mouth daily as needed for muscle spasms.  30 tablet  1  . metoprolol succinate (TOPROL-XL) 12.5 mg TB24 Take 0.5 tablets (12.5 mg total) by mouth daily.  30 tablet  1  . naproxen (NAPROSYN) 500 MG tablet Take 1 tablet (500 mg total) by mouth 2 (two) times daily with a meal. First dose take 1000mg . Continue 500mg  twice for 7 days  20 tablet  1  . pravastatin (PRAVACHOL) 40 MG tablet Take 1  tablet (40 mg total) by mouth daily.  30 tablet  11  . sertraline (ZOLOFT) 25 MG tablet Take 1 tablet (25 mg total) by mouth daily.  30 tablet  4  . solifenacin (VESICARE) 5 MG tablet Take 1 tablet (5 mg total) by mouth daily.  30 tablet  2  . zolpidem (AMBIEN) 5 MG tablet Take 1 tablet (5 mg total) by mouth at bedtime as needed for sleep.  30 tablet  0   No family history on file. History   Social History  . Marital Status: Married    Spouse Name: N/A    Number of Children: N/A  . Years of Education: N/A   Social History Main Topics  . Smoking status: Current Everyday Smoker -- 1.5 packs/day    Types: Cigarettes    Last Attempt to Quit: 01/26/2011  . Smokeless tobacco: None   Comment: restarted.   . Alcohol Use: No  . Drug Use: No  .  Sexually Active: Yes   Other Topics Concern  . None   Social History Narrative  . None   Review of Systems: Constitutional: Denies fever, chills, diaphoresis, appetite change and fatigue.   Respiratory: Denies SOB, DOE, cough, chest tightness,  and wheezing.   Cardiovascular: Denies chest pain, palpitations and leg swelling.  Gastrointestinal: Denies , vomiting, abdominal pain,constipation, blood in stool and abdominal distention.  Genitourinary: Denies dysuria, urgency, frequency, hematuria, flank pain and difficulty urinating.  Skin: Denies pallor, rash and wound.  Neurological: Denies dizziness,  light-headedness,  and headaches.  Psychiatric/Behavioral: Denies suicidal ideation, mood changes,   Objective:  Physical Exam: Filed Vitals:   02/15/12 1507 02/15/12 1601  BP: 157/82 158/90  Pulse: 62 59  Temp: 97.4 F (36.3 C)   TempSrc: Oral   Height: 5' 5.5" (1.664 m)   Weight: 177 lb (80.287 kg)   SpO2: 98%    Constitutional: Vital signs reviewed.  Patient is a well-developed and well-nourished woman in no acute distress and cooperative with exam. Alert and oriented x3.  Mouth: no erythema or exudates, MMM Eyes: PERRL, EOMI, conjunctivae normal, No scleral icterus.  Neck: Supple,   Cardiovascular: RRR, S1 normal, S2 normal, no MRG, pulses symmetric and intact bilaterally Pulmonary/Chest: CTAB, no wheezes, rales, or rhonchi Abdominal: Soft. Non-tender, non-distended, bowel sounds are normal, no masses,  GU: no CVA tenderness Neurological: A&O x3, Strength is normal and symmetric bilaterally, cranial nerve II-XII are grossly intact, no focal motor deficit, sensory intact to light touch bilaterally.  Psychiatric: Normal mood and affect. speech  is normal.

## 2012-02-15 NOTE — Patient Instructions (Signed)
Please make sure to drink enough fluids . If you notice worsening symptoms please call the clinic for further instructions.  1-800 -Quit- Now hotline available or the social worker in the clinic.

## 2012-02-16 ENCOUNTER — Encounter: Payer: Medicaid Other | Admitting: Gastroenterology

## 2012-02-16 DIAGNOSIS — R197 Diarrhea, unspecified: Secondary | ICD-10-CM | POA: Insufficient documentation

## 2012-02-16 NOTE — Assessment & Plan Note (Signed)
Blood pressure continues to be elevated. I will restart Norvasc 5 mg which was d/c during last hospital admission due hypotension.Will reevaluate the patient in 2 weeks for recheck of BP. If it continues to be elevated consider to increase. BP Readings from Last 3 Encounters:  02/15/12 158/90  01/31/12 102/72  01/27/12 104/67

## 2012-02-16 NOTE — Assessment & Plan Note (Signed)
Patient scheduled for Colonoscopy in September.

## 2012-02-16 NOTE — Assessment & Plan Note (Signed)
Counseled the importance of quitting smoking with history of CAD and CVA. Patient has nicotine patches and I advised to use it as soon as possible. I also provide the 1-800-Quit -Now hotline number and if she needed further advice or help to call the clinic or the SW who can assist.  She noted that she will try her best. Her husband was present during the office visit and who noted that they will quit together.

## 2012-02-16 NOTE — Assessment & Plan Note (Signed)
Likely  just gastroenteritis . Recommended to keep well hydrated (gatorades) . If symptoms worsening and if she is experiencing abdominal pain and emesis I advised her that she needs  to be evaluated in the clinic again .

## 2012-02-16 NOTE — Assessment & Plan Note (Signed)
Patient had gout attach 1 week ago. I will obtain uric acid today. I recommended a gout diet and to drink a lot of fluid. I would recommend to check level during next office visit. If it remains elevated I would start therapy. Gout is anindependent risk factor for CAD.   Uric acid Level elevated: This may related to acute gout resolution. I will recheck during next office visit.

## 2012-02-27 ENCOUNTER — Other Ambulatory Visit: Payer: Self-pay | Admitting: Internal Medicine

## 2012-02-28 ENCOUNTER — Telehealth: Payer: Self-pay | Admitting: *Deleted

## 2012-02-28 NOTE — Telephone Encounter (Signed)
Pt called asking for refill on flexeril.  I called pharmacy and they have refills but state there is a drug interaction with Zoloft and with pt having chronic heart failure. Please advise. Pharmacy # [phone]

## 2012-02-29 ENCOUNTER — Ambulatory Visit (INDEPENDENT_AMBULATORY_CARE_PROVIDER_SITE_OTHER): Payer: Medicaid Other | Admitting: Internal Medicine

## 2012-02-29 ENCOUNTER — Other Ambulatory Visit: Payer: Self-pay | Admitting: Internal Medicine

## 2012-02-29 ENCOUNTER — Encounter: Payer: Self-pay | Admitting: Internal Medicine

## 2012-02-29 VITALS — BP 130/86 | HR 68 | Temp 97.0°F | Ht 65.5 in | Wt 179.1 lb

## 2012-02-29 DIAGNOSIS — I10 Essential (primary) hypertension: Secondary | ICD-10-CM

## 2012-02-29 DIAGNOSIS — M109 Gout, unspecified: Secondary | ICD-10-CM

## 2012-02-29 DIAGNOSIS — F419 Anxiety disorder, unspecified: Secondary | ICD-10-CM

## 2012-02-29 DIAGNOSIS — Z23 Encounter for immunization: Secondary | ICD-10-CM

## 2012-02-29 DIAGNOSIS — F411 Generalized anxiety disorder: Secondary | ICD-10-CM

## 2012-02-29 MED ORDER — BACLOFEN 10 MG PO TABS: 10.0000 mg | ORAL_TABLET | Freq: Every day | ORAL | Status: DC

## 2012-02-29 MED ORDER — ALLOPURINOL 300 MG PO TABS: 300.0000 mg | ORAL_TABLET | Freq: Every day | ORAL | Status: DC

## 2012-02-29 MED ORDER — CLONAZEPAM 1 MG PO TABS: 0.5000 mg | ORAL_TABLET | Freq: Three times a day (TID) | ORAL | Status: DC | PRN

## 2012-02-29 MED ORDER — NAPROXEN 500 MG PO TABS: 500.0000 mg | ORAL_TABLET | Freq: Two times a day (BID) | ORAL | Status: DC

## 2012-02-29 NOTE — Progress Notes (Signed)
Subjective:   [name] female   DOB: [birth year] 51 y.o.   MRN: [id number]  HPI: [name] is a 51 y.o. woman who presents to clinic today for follow up of her chronic medical conditions including hypertension, gout, and anxiety.  See Problem focused Assessment and Plan for full details.   Past Medical History  Diagnosis Date  . PFO (patent foramen ovale) August 2012    PFO seen on TEE during hospitalization in 01/2011.  Patient to f/u with Dr. Pearlean Brownie, neurology, for enrollment in trial for medical treatment of PFO  . CAD (coronary artery disease) 2007    istory of MI with stent in 2007 by Dr. Park Breed, [facility]. Stent placement by Dr Karoline Caldwell   . Prediabetes 03/07/2011  . Hypertension 03/07/2011  . Hyperlipidemia 03/07/2011  . Tobacco abuse 03/07/2011  . Depression 03/07/2011  . Psoriasis 03/07/2011  . Acute right MCA stroke 01/27/11    Acute R MCA stroked  with L arm and leg weakness  . Anxiety 1980s    On Klonipin since age 51. Had addiction problem with Xanax which  Was therefore d/c . Seen in the past at [facility].   . Fractured toe 01/2011    History of left fifth toe proximal phalanx fracture when patient had a stroke (01/2011) and fell. Seen by Dr Victorino Dike.   . Incontinence 04/14/2011  . Arthritis 04/16/2011  . Myocardial infarct, old   . Tobacco abuse 03/07/2011    Quit 01/2011   . Fracture of fifth toe, left, closed 01/28/2011    History of left fifth toe proximal phalanx fracture when patient had a stroke (01/2011) and fell. Seen by Dr Victorino Dike.    Marland Kitchen GERD (gastroesophageal reflux disease)   . Substance abuse     h/o narcotic abuse pt denies as of 01/09/12   Current Outpatient Prescriptions  Medication Sig Dispense Refill  . amLODipine (NORVASC) 5 MG tablet Take 1 tablet (5 mg total) by mouth daily.  30 tablet  0  . baclofen (LIORESAL) 10 MG tablet Take 1 tablet (10 mg total) by mouth daily.  30 tablet  1  . clonazePAM (KLONOPIN) 1 MG tablet Take 0.5  tablets (0.5 mg total) by mouth 3 (three) times daily as needed for anxiety.  60 tablet  0  . clopidogrel (PLAVIX) 75 MG tablet TAKE ONE TABLET BY MOUTH ONE TIME DAILY  30 tablet  2  . metoprolol succinate (TOPROL-XL) 12.5 mg TB24 Take 0.5 tablets (12.5 mg total) by mouth daily.  30 tablet  1  . naproxen (NAPROSYN) 500 MG tablet Take 1 tablet (500 mg total) by mouth 2 (two) times daily with a meal. First dose take 1000mg . Continue 500mg  twice for 7 days  20 tablet  1  . pravastatin (PRAVACHOL) 40 MG tablet Take 1 tablet (40 mg total) by mouth daily.  30 tablet  11  . sertraline (ZOLOFT) 25 MG tablet Take 1 tablet (25 mg total) by mouth daily.  30 tablet  4  . solifenacin (VESICARE) 5 MG tablet Take 1 tablet (5 mg total) by mouth daily.  30 tablet  2  . zolpidem (AMBIEN) 5 MG tablet Take 1 tablet (5 mg total) by mouth at bedtime as needed for sleep.  30 tablet  0   No family history on file. History   Social History  . Marital Status: Married    Spouse Name: N/A    Number of Children: N/A  . Years of  Education: N/A   Social History Main Topics  . Smoking status: Current Every Day Smoker -- 1.5 packs/day    Types: Cigarettes    Last Attempt to Quit: 01/26/2011  . Smokeless tobacco: None   Comment: restarted.   . Alcohol Use: No  . Drug Use: No  . Sexually Active: Yes   Other Topics Concern  . None   Social History Narrative  . None   Review of Systems: Constitutional: Denies fever, chills, diaphoresis, appetite change and fatigue.  HEENT: Denies photophobia, eye pain, redness, hearing loss, ear pain, congestion, sore throat, rhinorrhea, sneezing, mouth sores, trouble swallowing, neck pain, neck stiffness and tinnitus.   Respiratory: Denies SOB, DOE, cough, chest tightness,  and wheezing.   Cardiovascular: Denies chest pain, palpitations and leg swelling.  Gastrointestinal: Denies nausea, vomiting, abdominal pain, diarrhea, constipation, blood in stool and abdominal distention.    Genitourinary: Denies dysuria, urgency, frequency, hematuria, flank pain and difficulty urinating.  Musculoskeletal: Denies myalgias, back pain, joint swelling, arthralgias and gait problem.  Skin: Denies pallor, rash and wound.  Neurological: Denies dizziness, seizures, syncope, weakness, light-headedness, numbness and headaches.  Hematological: Denies adenopathy. Easy bruising, personal or family bleeding history  Psychiatric/Behavioral: Denies suicidal ideation, mood changes, confusion, nervousness, sleep disturbance and agitation  Objective:  Physical Exam: Filed Vitals:   02/29/12 1412  BP: 130/86  Pulse: 68  Temp: 97 F (36.1 C)  TempSrc: Oral  Height: 5' 5.5" (1.664 m)  Weight: 179 lb 1.6 oz (81.239 kg)  SpO2: 98%   Constitutional: Vital signs reviewed.  Patient is a well-developed and well-nourished, mildly anxious appearing woman in no acute distress and cooperative with exam. Alert and oriented x3.  Head: Normocephalic and atraumatic Ear: TM normal bilaterally Mouth: no erythema or exudates, MMM Eyes: PERRL, EOMI, conjunctivae normal, No scleral icterus.  Neck: Supple, Trachea midline normal ROM, No JVD, mass, thyromegaly, or carotid bruit present.  Cardiovascular: RRR, S1 normal, S2 normal, no MRG, pulses symmetric and intact bilaterally Pulmonary/Chest: CTAB, no wheezes, rales, or rhonchi Abdominal: Soft. Non-tender, non-distended, bowel sounds are normal, no masses, organomegaly, or guarding present.  GU: no CVA tenderness Musculoskeletal: mild swelling in the MTP of the great toe bilaterally but no erythema or pain with palpation or movement.  No joint deformities, erythema, or stiffness, ROM full and no nontender Hematology: no cervical, inginal, or axillary adenopathy.  Neurological: A&O x3, Strength is normal and symmetric bilaterally, cranial nerve II-XII are grossly intact, no focal motor deficit, sensory intact to light touch bilaterally.  Skin: Warm, dry and  intact. No rash, cyanosis, or clubbing.  Psychiatric: mildly anxious mood and affect. speech and behavior is normal. Judgment and thought content normal. Cognition and memory are normal.   Assessment & Plan:

## 2012-02-29 NOTE — Patient Instructions (Addendum)
1.  Start Allopurinol 300 mg tablets.  Take 1 tablet daily to prevent your gout.  2.  It is okay to take the naproxen if you get a flare of the gout.  3.  Continue your blood pressure medications as prescribed.  4. Follow up with Dr. Loistine Chance in October.

## 2012-03-05 ENCOUNTER — Ambulatory Visit: Payer: Medicaid Other | Admitting: Gastroenterology

## 2012-03-05 NOTE — Addendum Note (Signed)
Addended by: Neomia Dear on: 03/05/2012 06:34 PM   Modules accepted: Orders

## 2012-03-13 ENCOUNTER — Other Ambulatory Visit: Payer: Self-pay | Admitting: Internal Medicine

## 2012-03-14 ENCOUNTER — Other Ambulatory Visit: Payer: Self-pay | Admitting: *Deleted

## 2012-03-14 DIAGNOSIS — G4700 Insomnia, unspecified: Secondary | ICD-10-CM

## 2012-03-14 NOTE — Telephone Encounter (Signed)
Pharmacy is giving pt only #15  Ambien at a time.

## 2012-03-15 ENCOUNTER — Encounter: Payer: Self-pay | Admitting: Gastroenterology

## 2012-03-16 MED ORDER — ZOLPIDEM TARTRATE 5 MG PO TABS: 5.0000 mg | ORAL_TABLET | Freq: Every evening | ORAL | Status: DC | PRN

## 2012-03-21 ENCOUNTER — Ambulatory Visit (INDEPENDENT_AMBULATORY_CARE_PROVIDER_SITE_OTHER): Payer: Medicaid Other | Admitting: Internal Medicine

## 2012-03-21 ENCOUNTER — Encounter: Payer: Self-pay | Admitting: Internal Medicine

## 2012-03-21 VITALS — BP 112/79 | HR 70 | Temp 97.3°F | Ht 65.0 in | Wt 173.1 lb

## 2012-03-21 DIAGNOSIS — M109 Gout, unspecified: Secondary | ICD-10-CM

## 2012-03-21 MED ORDER — NAPROXEN 500 MG PO TABS: 500.0000 mg | ORAL_TABLET | Freq: Two times a day (BID) | ORAL | Status: DC

## 2012-03-21 MED ORDER — COLCHICINE 0.6 MG PO TABS: ORAL_TABLET | ORAL | Status: DC

## 2012-03-21 NOTE — Progress Notes (Signed)
HPI The patient is a 51 y.o. yo female with a history of gout, presenting for an acute visit for toe pain.  The patient notes a 5-day history of right 5th toe pain.  The patient has a history of gout, currently on allopurinol.  For her current flare, the patient started taking naproxen yesterday.  The patient notes that the pain has improved from a 10 to a 5 after taking naproxen.  The patient's last gout flare was about 2 months ago in her left foot, and also resolved with naproxen.  The patient started allopurinol about 5 weeks ago.  She notes no fevers, chills, skin abrasion on her foot, or purulence at the site of pain.  The patient does not think that she has ever had a joint aspirated for definitive diagnosis of gout.  ROS: General: no fevers, chills, changes in weight, changes in appetite Skin: no rash HEENT: no blurry vision, hearing changes, sore throat Pulm: no dyspnea, coughing, wheezing CV: no chest pain, palpitations, shortness of breath Abd: no abdominal pain, nausea/vomiting, diarrhea/constipation GU: no dysuria, hematuria, polyuria Ext: see HPI Neuro: no weakness, numbness, or tingling  Filed Vitals:   03/21/12 0822  BP: 112/79  Pulse: 70  Temp: 97.3 F (36.3 C)    PEX General: alert, cooperative, appears mildly uncomfortable HEENT: pupils equal round and reactive to light, vision grossly intact, oropharynx clear and non-erythematous  Neck: supple, no lymphadenopathy Lungs: clear to ascultation bilaterally, normal work of respiration, no wheezes, rales, ronchi Heart: regular rate and rhythm, no murmurs, gallops, or rubs Abdomen: soft, non-tender, non-distended, normal bowel sounds Extremities: right 5th MTP joint moderately tender to palpation.  No overlying edema or erythema.  No involvement of other toes. Neurologic: alert & oriented X3, cranial nerves II-XII intact, strength grossly intact, sensation intact to light touch  Current Outpatient Prescriptions on File  Prior to Visit  Medication Sig Dispense Refill  . allopurinol (ZYLOPRIM) 300 MG tablet Take 1 tablet (300 mg total) by mouth daily.  30 tablet  2  . amLODipine (NORVASC) 5 MG tablet TAKE ONE TABLET BY MOUTH ONE TIME DAILY  30 tablet  2  . baclofen (LIORESAL) 10 MG tablet Take 1 tablet (10 mg total) by mouth daily.  30 tablet  1  . clonazePAM (KLONOPIN) 1 MG tablet Take 0.5 tablets (0.5 mg total) by mouth 3 (three) times daily as needed for anxiety.  60 tablet  2  . clopidogrel (PLAVIX) 75 MG tablet TAKE ONE TABLET BY MOUTH ONE TIME DAILY  30 tablet  2  . metoprolol succinate (TOPROL-XL) 12.5 mg TB24 Take 0.5 tablets (12.5 mg total) by mouth daily.  30 tablet  1  . naproxen (NAPROSYN) 500 MG tablet Take 1 tablet (500 mg total) by mouth 2 (two) times daily with a meal.  20 tablet  1  . pravastatin (PRAVACHOL) 40 MG tablet Take 1 tablet (40 mg total) by mouth daily.  30 tablet  11  . sertraline (ZOLOFT) 25 MG tablet Take 1 tablet (25 mg total) by mouth daily.  30 tablet  4  . solifenacin (VESICARE) 5 MG tablet Take 1 tablet (5 mg total) by mouth daily.  30 tablet  2  . zolpidem (AMBIEN) 5 MG tablet Take 1 tablet (5 mg total) by mouth at bedtime as needed for sleep.  30 tablet  0    Assessment/Plan

## 2012-03-21 NOTE — Patient Instructions (Signed)
Your pain is most likely from an acute gout flare. -continue to take Naproxen, 500 mg twice per day, for 7 days or until pain resolves -if your pain continues, you may take Colchicine.  Take 2 tablets.  If the pain has not improved in 1 hour, take another 1 tablet at that time.  Then stop taking this medication (do not take more than 3 tablets total during a gout flare without first talking with a physician). -if you pain continues despite these medications, call our clinic for further guidance -continue taking allopurinol  Return for a follow-up appointment in 3-4 months, or sooner if the pain does not improve.

## 2012-03-21 NOTE — Assessment & Plan Note (Signed)
The patient presents with right 5th MTP joint pain, which she notes is consistent with her prior gout flares.  The pain is in an unusual location for gout, and the joint currently does not have significant signs of inflammation on examination.  However, her pain has improved with naproxen. -continue naproxen -continue allopurinol -patient given prescription for colchicine to fill if symptoms do not improve with naproxen

## 2012-03-26 NOTE — Telephone Encounter (Signed)
Pharmacy states someone called in med 03/20/12 from clinic.

## 2012-04-04 ENCOUNTER — Telehealth: Payer: Self-pay | Admitting: *Deleted

## 2012-04-04 NOTE — Telephone Encounter (Signed)
Pt was calling too early for Ambien - talked with pharmacy and pt got #30 on 03/20/12. Pt has been approved through [facility] for 30/month. Suggest for pt to call pharmacy closer to 04/20/12 for refill - pt would like additional refills. Stanton Kidney Dequon Schnebly RN 04/04/12 4PM

## 2012-04-10 ENCOUNTER — Ambulatory Visit (INDEPENDENT_AMBULATORY_CARE_PROVIDER_SITE_OTHER): Payer: Medicaid Other | Admitting: Gastroenterology

## 2012-04-10 ENCOUNTER — Encounter: Payer: Self-pay | Admitting: Gastroenterology

## 2012-04-10 VITALS — BP 108/70 | HR 60 | Ht 66.0 in | Wt 180.2 lb

## 2012-04-10 DIAGNOSIS — Z1211 Encounter for screening for malignant neoplasm of colon: Secondary | ICD-10-CM

## 2012-04-10 DIAGNOSIS — I2510 Atherosclerotic heart disease of native coronary artery without angina pectoris: Secondary | ICD-10-CM

## 2012-04-10 DIAGNOSIS — Z8673 Personal history of transient ischemic attack (TIA), and cerebral infarction without residual deficits: Secondary | ICD-10-CM

## 2012-04-10 MED ORDER — PEG-KCL-NACL-NASULF-NA ASC-C 100 G PO SOLR: 1.0000 | Freq: Once | ORAL | Status: DC

## 2012-04-10 NOTE — Patient Instructions (Signed)
You have been scheduled for a colonoscopy with propofol. Please follow written instructions given to you at your visit today.  Please pick up your prep kit at the pharmacy within the next 1-3 days. If you use inhalers (even only as needed), please bring them with you on the day of your procedure.  You will be contacted by our office prior to your procedure for directions on holding your Plavix.  If you do not hear from our office 1 week prior to your scheduled procedure, please call [phone] to discuss.   cc: Almyra Deforest, MD

## 2012-04-10 NOTE — Progress Notes (Signed)
History of Present Illness: This is a 51 year old [name] referred for colorectal cancer screening. She is maintained on Plavix after suffering a CVA in 2012. She has a history of coronary artery disease and is status post coronary artery stent placement 2007. She has has a history of a PFO. She has stable coronary artery disease, hypertension and hyperlipidemia. Denies weight loss, abdominal pain, constipation, diarrhea, change in stool caliber, melena, hematochezia, nausea, vomiting, dysphagia, reflux symptoms, chest pain.  Allergies  Allergen Reactions  . Azor (Amlodipine-Olmesartan) Other (See Comments)    Drops blood pressure too low   Outpatient Prescriptions Prior to Visit  Medication Sig Dispense Refill  . allopurinol (ZYLOPRIM) 300 MG tablet Take 1 tablet (300 mg total) by mouth daily.  30 tablet  2  . baclofen (LIORESAL) 10 MG tablet Take 1 tablet (10 mg total) by mouth daily.  30 tablet  1  . clonazePAM (KLONOPIN) 1 MG tablet Take 0.5 tablets (0.5 mg total) by mouth 3 (three) times daily as needed for anxiety.  60 tablet  2  . clopidogrel (PLAVIX) 75 MG tablet TAKE ONE TABLET BY MOUTH ONE TIME DAILY  30 tablet  2  . metoprolol succinate (TOPROL-XL) 12.5 mg TB24 Take 0.5 tablets (12.5 mg total) by mouth daily.  30 tablet  1  . naproxen (NAPROSYN) 500 MG tablet Take 1 tablet (500 mg total) by mouth 2 (two) times daily with a meal.  20 tablet  1  . pravastatin (PRAVACHOL) Sandra MG tablet Take 1 tablet (Sandra mg total) by mouth daily.  30 tablet  11  . sertraline (ZOLOFT) 25 MG tablet Take 1 tablet (25 mg total) by mouth daily.  30 tablet  4  . solifenacin (VESICARE) 5 MG tablet Take 1 tablet (5 mg total) by mouth daily.  30 tablet  2  . zolpidem (AMBIEN) 5 MG tablet Take 1 tablet (5 mg total) by mouth at bedtime as needed for sleep.  30 tablet  0  . amLODipine (NORVASC) 5 MG tablet TAKE ONE TABLET BY MOUTH ONE TIME DAILY  30 tablet  2  . colchicine 0.6 MG tablet Take 2 tablets at the start of an  acute gout flare, then take another 1 tablet 1 hour later if the pain persists, then stop this medication.  6 tablet  0   Past Medical History  Diagnosis Date  . PFO (patent foramen ovale) August 2012    PFO seen on TEE during hospitalization in 01/2011.  Patient to f/u with Dr. Pearlean Brownie, neurology, for enrollment in trial for medical treatment of PFO  . CAD (coronary artery disease) 2007    istory of MI with stent in 2007 by Dr. Park Breed, [facility]. Stent placement by Dr Karoline Caldwell   . Prediabetes 03/07/2011  . Hypertension 03/07/2011  . Hyperlipidemia 03/07/2011  . Tobacco abuse 03/07/2011  . Depression 03/07/2011  . Psoriasis 03/07/2011  . Acute right MCA stroke 01/27/11    Acute R MCA stroked  with L arm and leg weakness  . Anxiety 1980s    On Klonipin since age 26. Had addiction problem with Xanax which  Was therefore d/c . Seen in the past at [facility].   . Fractured toe 01/2011    History of left fifth toe proximal phalanx fracture when patient had a stroke (01/2011) and fell. Seen by Dr Victorino Dike.   . Incontinence 04/14/2011  . Arthritis 04/16/2011  . Myocardial infarct, old   . Tobacco abuse 03/07/2011    Quit 01/2011   .  Fracture of fifth toe, left, closed 01/28/2011    History of left fifth toe proximal phalanx fracture when patient had a stroke (01/2011) and fell. Seen by Dr Victorino Dike.    Marland Kitchen GERD (gastroesophageal reflux disease)   . Substance abuse     h/o narcotic abuse pt denies as of 01/09/12   Past Surgical History  Procedure Date  . Cardiac stent     2008   History   Social History  . Marital Status: Married    Spouse Name: N/A    Number of Children: 1  . Years of Education: N/A   Occupational History  . Disabled    Social History Main Topics  . Smoking status: Former Smoker -- 1.5 packs/day    Types: Cigarettes    Quit date: 01/26/2011  . Smokeless tobacco: Never Used   Comment: restarted.   . Alcohol Use: No  . Drug Use: No  . Sexually Active: Yes   Other  Topics Concern  . None   Social History Narrative  . None   Family History  Problem Relation Age of Onset  . Heart disease Mother   . Diabetes Mother   . Heart disease Father   . Diabetes Father   . Heart disease Brother   . Diabetes Brother   . Heart disease Maternal Grandmother   . Heart disease Maternal Grandfather   . Heart disease Paternal Grandmother   . Heart disease Paternal Grandfather   . Lung cancer Brother   . Heart disease Brother     Review of Systems: Pertinent positive and negative review of systems were noted in the above HPI section. All other review of systems were otherwise negative.  Physical Exam: General: Well developed , well nourished, no acute distress Head: Normocephalic and atraumatic Eyes:  sclerae anicteric, EOMI Ears: Normal auditory acuity Mouth: No deformity or lesions Neck: Supple, no masses or thyromegaly Lungs: Clear throughout to auscultation Heart: Regular rate and rhythm; no murmurs, rubs or bruits Abdomen: Soft, non tender and non distended. No masses, hepatosplenomegaly or hernias noted. Normal Bowel sounds Rectal: Deferred to colonoscopy Musculoskeletal: Symmetrical with no gross deformities  Skin: No lesions on visible extremities Pulses:  Normal pulses noted Extremities: No clubbing, cyanosis, edema or deformities noted Neurological: Alert oriented x 4, left hemiparesis Cervical Nodes:  No significant cervical adenopathy Inguinal Nodes: No significant inguinal adenopathy Psychological:  Alert and cooperative. Normal mood and affect  Assessment and Recommendations:  1. Colorectal cancer screening, average risk. Status post CVA 2012 and status post coronary artery stent placement 2007. The risks, benefits and alternatives to a 5 day hold of Plavix were discussed with the patient and she agrees to proceed. Will request clearance from her primary physician to hold Plavix for 5 days. If it is not advisable to stop Plavix, even on a  temporary basis, we can perform colonoscopy while on Plavix however we will not be able to remove medium to large-size polyps due to the increased risk of bleeding. Another option would be to obtain a virtual colonoscopy for screening however this is likely not covered by her current insurance. The risks, benefits, and alternatives to colonoscopy with possible biopsy and possible polypectomy were discussed with the patient and they consent to proceed.

## 2012-04-17 ENCOUNTER — Other Ambulatory Visit: Payer: Self-pay | Admitting: *Deleted

## 2012-04-17 DIAGNOSIS — G4700 Insomnia, unspecified: Secondary | ICD-10-CM

## 2012-04-18 MED ORDER — ZOLPIDEM TARTRATE 5 MG PO TABS: 5.0000 mg | ORAL_TABLET | Freq: Every evening | ORAL | Status: DC | PRN

## 2012-04-19 NOTE — Telephone Encounter (Signed)
Rx called in 

## 2012-04-30 ENCOUNTER — Telehealth: Payer: Self-pay | Admitting: Internal Medicine

## 2012-04-30 NOTE — Telephone Encounter (Signed)
Patient will undergo colonoscopy on 11/19. Patient needs to hold plavix for possible biopsy . I have discussed with the patient about risk and benefits in holding the plavix. She noted that she understood. Patient needs to be restarted immediately back on plavix after colonoscopy.

## 2012-05-03 ENCOUNTER — Other Ambulatory Visit: Payer: Self-pay | Admitting: Internal Medicine

## 2012-05-04 ENCOUNTER — Telehealth: Payer: Self-pay | Admitting: Gastroenterology

## 2012-05-04 NOTE — Telephone Encounter (Signed)
Yes charge 

## 2012-05-08 ENCOUNTER — Other Ambulatory Visit: Payer: Self-pay | Admitting: *Deleted

## 2012-05-08 ENCOUNTER — Encounter: Payer: Medicaid Other | Admitting: Gastroenterology

## 2012-05-08 MED ORDER — SOLIFENACIN SUCCINATE 5 MG PO TABS: 5.0000 mg | ORAL_TABLET | Freq: Every day | ORAL | Status: DC

## 2012-05-14 ENCOUNTER — Other Ambulatory Visit: Payer: Self-pay | Admitting: Internal Medicine

## 2012-05-15 NOTE — Telephone Encounter (Signed)
Pt informed and voices understanding 

## 2012-05-15 NOTE — Telephone Encounter (Signed)
I refilled for #20; if patient is having recurrent gout symptoms, she should be seen and evaluated.

## 2012-05-21 ENCOUNTER — Encounter: Payer: Medicaid Other | Admitting: Internal Medicine

## 2012-05-28 ENCOUNTER — Other Ambulatory Visit: Payer: Self-pay | Admitting: Internal Medicine

## 2012-05-28 DIAGNOSIS — M109 Gout, unspecified: Secondary | ICD-10-CM

## 2012-05-29 ENCOUNTER — Other Ambulatory Visit: Payer: Self-pay | Admitting: *Deleted

## 2012-05-29 ENCOUNTER — Encounter: Payer: Self-pay | Admitting: Internal Medicine

## 2012-05-29 ENCOUNTER — Ambulatory Visit (INDEPENDENT_AMBULATORY_CARE_PROVIDER_SITE_OTHER): Payer: Medicaid Other | Admitting: Internal Medicine

## 2012-05-29 VITALS — BP 130/80 | HR 58 | Temp 98.5°F | Resp 20 | Ht 65.0 in | Wt 184.7 lb

## 2012-05-29 DIAGNOSIS — Z1231 Encounter for screening mammogram for malignant neoplasm of breast: Secondary | ICD-10-CM | POA: Insufficient documentation

## 2012-05-29 DIAGNOSIS — F17200 Nicotine dependence, unspecified, uncomplicated: Secondary | ICD-10-CM

## 2012-05-29 DIAGNOSIS — M109 Gout, unspecified: Secondary | ICD-10-CM

## 2012-05-29 DIAGNOSIS — I10 Essential (primary) hypertension: Secondary | ICD-10-CM

## 2012-05-29 DIAGNOSIS — F411 Generalized anxiety disorder: Secondary | ICD-10-CM

## 2012-05-29 DIAGNOSIS — G4700 Insomnia, unspecified: Secondary | ICD-10-CM

## 2012-05-29 DIAGNOSIS — F419 Anxiety disorder, unspecified: Secondary | ICD-10-CM

## 2012-05-29 DIAGNOSIS — Z720 Tobacco use: Secondary | ICD-10-CM

## 2012-05-29 DIAGNOSIS — E785 Hyperlipidemia, unspecified: Secondary | ICD-10-CM

## 2012-05-29 MED ORDER — BACLOFEN 10 MG PO TABS: 10.0000 mg | ORAL_TABLET | Freq: Every day | ORAL | Status: DC

## 2012-05-29 MED ORDER — METOPROLOL SUCCINATE ER 25 MG PO TB24: 25.0000 mg | ORAL_TABLET | Freq: Every day | ORAL | Status: DC

## 2012-05-29 MED ORDER — LOSARTAN POTASSIUM 100 MG PO TABS: 100.0000 mg | ORAL_TABLET | Freq: Every day | ORAL | Status: DC

## 2012-05-29 MED ORDER — ZOLPIDEM TARTRATE 5 MG PO TABS: 5.0000 mg | ORAL_TABLET | Freq: Every evening | ORAL | Status: DC | PRN

## 2012-05-29 MED ORDER — ASPIRIN 81 MG PO TABS: 81.0000 mg | ORAL_TABLET | Freq: Every day | ORAL | Status: DC

## 2012-05-29 MED ORDER — CLONAZEPAM 1 MG PO TABS: 0.5000 mg | ORAL_TABLET | Freq: Three times a day (TID) | ORAL | Status: DC | PRN

## 2012-05-29 MED ORDER — AMLODIPINE BESYLATE 10 MG PO TABS: 10.0000 mg | ORAL_TABLET | Freq: Every day | ORAL | Status: DC

## 2012-05-29 NOTE — Patient Instructions (Signed)
Please bring your medication with you during next office visit  Stop smoking !!!!!!

## 2012-05-29 NOTE — Assessment & Plan Note (Signed)
Significant changes were made by cardiology last month including increase of metoprolol from 12.5 mg daily to 25 mg daily, Norvasc 5 mg daily to 10 mg daily and losartan 50 mg daily 100 mg daily. Patient reports has been compliant. We'll continue current regimen. BP Readings from Last 3 Encounters:  05/29/12 130/80  04/10/12 108/70  03/21/12 112/79   BMET    Component Value Date/Time   NA 142 01/27/2012 0500   K 4.2 01/27/2012 0500   CL 111 01/27/2012 0500   CO2 22 01/27/2012 0500   GLUCOSE 102* 01/27/2012 0500   BUN 14 01/27/2012 0500   CREATININE 0.78 01/27/2012 0500   CREATININE 0.84 01/09/2012 1620   CALCIUM 8.8 01/27/2012 0500   GFRNONAA >90 01/27/2012 0500   GFRAA >90 01/27/2012 0500

## 2012-05-29 NOTE — Assessment & Plan Note (Signed)
Refilled Klonipin today

## 2012-05-29 NOTE — Assessment & Plan Note (Signed)
Patient had quit in the past but restarted. Counseled again about the importance to stop smoking. Patient is ready to quit and will work with her husband

## 2012-05-29 NOTE — Assessment & Plan Note (Signed)
Referred for mammogram today. 

## 2012-05-29 NOTE — Assessment & Plan Note (Addendum)
Lipid panel was obtained by Cardiology in November and patient was changed from pravastatin 40 mg to fenofibric acid  unclear dosage . A repeat Lipid panel is scheduled for January. We will obtain records from Dr Lennox Solders .

## 2012-05-29 NOTE — Progress Notes (Signed)
Subjective:   [name] female   DOB: May 02, 1961 52 y.o.   MRN: [id number]  HPI: [name] is a 51 y.o.  female with past medical history significant as outlined below who presented to the clinic for her regular office visit. Patient reports that she has been doing fine and had no complaints today. She was seen by Dr. Lennox [name] last month and her medications has been changed including; aspirin 81 mg daily, increase of Cozaar from 50 mg to 100 mg daily, increase Norvasc from 5 mg to 10 mg and Toprol -XL from 12.5 mg to 25 mg daily.  She has been tolerating the medication well. She noted she had no further episodes of gout flares since her last visit. She continues to smoke one half pack a day. Her husband noted that she had cut down. He informed me that he also is smoking and both are trying to cut down as much as possible.  Colonoscopy scheduled for 06/25/2012    Past Medical History  Diagnosis Date  . PFO (patent foramen ovale) August 2012    PFO seen on TEE during hospitalization in 01/2011.  Patient to f/u with Dr. Pearlean [name], neurology, for enrollment in trial for medical treatment of PFO  . CAD (coronary artery disease) 2007    istory of MI with stent in 2007 by Dr. Park [name], [facility]. Stent placement by Dr Sandra [name]   . Prediabetes 03/07/2011  . Hypertension 03/07/2011  . Hyperlipidemia 03/07/2011  . Tobacco abuse 03/07/2011  . Depression 03/07/2011  . Psoriasis 03/07/2011  . Acute right MCA stroke 01/27/11    Acute R MCA stroked  with L arm and leg weakness  . Anxiety 1980s    On Klonipin since age 50. Had addiction problem with Xanax which  Was therefore d/c . Seen in the past at [facility].   . Fractured toe 01/2011    History of left fifth toe proximal phalanx fracture when patient had a stroke (01/2011) and fell. Seen by Dr Sandra [name].   . Incontinence 04/14/2011  . Arthritis 04/16/2011  . Myocardial infarct, old   . Tobacco abuse 03/07/2011    Quit 01/2011   .  Fracture of fifth toe, left, closed 01/28/2011    History of left fifth toe proximal phalanx fracture when patient had a stroke (01/2011) and fell. Seen by Dr Sandra [name].    Marland Kitchen GERD (gastroesophageal reflux disease)   . Substance abuse     h/o narcotic abuse pt denies as of 01/09/12   Current Outpatient Prescriptions  Medication Sig Dispense Refill  . allopurinol (ZYLOPRIM) 300 MG tablet TAKE ONE TABLET BY MOUTH ONE TIME DAILY  30 tablet  2  . amLODipine (NORVASC) 5 MG tablet       . baclofen (LIORESAL) 10 MG tablet TAKE ONE TABLET BY MOUTH ONE TIME DAILY  30 tablet  0  . clonazePAM (KLONOPIN) 1 MG tablet Take 0.5 tablets (0.5 mg total) by mouth 3 (three) times daily as needed for anxiety.  60 tablet  2  . clopidogrel (PLAVIX) 75 MG tablet TAKE ONE TABLET BY MOUTH ONE TIME DAILY  30 tablet  2  . losartan (COZAAR) 50 MG tablet Take 50 mg by mouth daily.      . metoprolol succinate (TOPROL-XL) 12.5 mg TB24 Take 0.5 tablets (12.5 mg total) by mouth daily.  30 tablet  1  . naproxen (NAPROSYN) 500 MG tablet Take 1 tablet (500 mg total) by mouth 2 (two) times  daily with a meal. Take as needed for gout.  20 tablet  0  . peg 3350 powder (MOVIPREP) 100 G SOLR Take 1 kit (100 g total) by mouth once.  1 kit  0  . pravastatin (PRAVACHOL) 40 MG tablet Take 1 tablet (40 mg total) by mouth daily.  30 tablet  11  . sertraline (ZOLOFT) 25 MG tablet Take 1 tablet (25 mg total) by mouth daily.  30 tablet  4  . solifenacin (VESICARE) 5 MG tablet Take 1 tablet (5 mg total) by mouth daily.  30 tablet  2  . zolpidem (AMBIEN) 5 MG tablet Take 1 tablet (5 mg total) by mouth at bedtime as needed for sleep.  30 tablet  1  . [DISCONTINUED] solifenacin (VESICARE) 5 MG tablet Take 1 tablet (5 mg total) by mouth daily.  30 tablet  2   Family History  Problem Relation Age of Onset  . Heart disease Mother   . Diabetes Mother   . Heart disease Father   . Diabetes Father   . Heart disease Brother   . Diabetes Brother   . Heart  disease Maternal Grandmother   . Heart disease Maternal Grandfather   . Heart disease Paternal Grandmother   . Heart disease Paternal Grandfather   . Lung cancer Brother   . Heart disease Brother    History   Social History  . Marital Status: Married    Spouse Name: N/A    Number of Children: 1  . Years of Education: N/A   Occupational History  . Disabled    Social History Main Topics  . Smoking status: Former Smoker -- 0.5 packs/day    Types: Cigarettes    Quit date: 01/26/2011  . Smokeless tobacco: Never Used     Comment: restarted but   . Alcohol Use: No  . Drug Use: No  . Sexually Active: Yes   Other Topics Concern  . Not on file   Social History Narrative  . No narrative on file   Review of Systems: Constitutional: Denies fever, chills, diaphoresis, appetite change and fatigue.  Respiratory: Denies SOB, DOE, cough, chest tightness,  and wheezing.   Cardiovascular: Denies chest pain, palpitations and leg swelling.  Gastrointestinal: Denies nausea, vomiting, abdominal pain, diarrhea, constipation, blood in stool and abdominal distention.  Genitourinary: Denies dysuria, urgency, frequency, hematuria, flank pain and difficulty urinating.    Neurological: Denies dizziness,  Headaches, weakness    Objective:  Physical Exam: Filed Vitals:   05/29/12 1508  BP: 137/83  Pulse: 58  Temp: 98.5 F (36.9 C)  TempSrc: Oral  Resp: 20  Height: 5\' 5"  (1.651 m)  Weight: 184 lb 11.2 oz (83.779 kg)  SpO2: 96%   Constitutional: Vital signs reviewed.  Patient is a well-developed and well-nourished female  in no acute distress and cooperative with exam. Alert and oriented x3.   Neck: Supple,  Cardiovascular: RRR, S1 normal, S2 normal, no MRG, pulses symmetric and intact bilaterally Pulmonary/Chest: CTAB, no wheezes, rales, or rhonchi Abdominal: Soft. Non-tender, non-distended, bowel sounds are normal,  Hematology: no cervical, Neurological: A&O x3,  no focal motor deficit,  sensory intact to light touch bilaterally.

## 2012-05-30 LAB — URIC ACID: Uric Acid, Serum: 4.6 mg/dL (ref 2.4–7.0)

## 2012-05-31 NOTE — Telephone Encounter (Signed)
Called to pharm 

## 2012-06-04 ENCOUNTER — Telehealth: Payer: Self-pay | Admitting: *Deleted

## 2012-06-04 NOTE — Telephone Encounter (Signed)
[name] had [facility] her colon and previsit and the previsit was still on the 16th of December and she was told the 23rd.  I [facility] her previsit to 06/11/12 at 9AM.  She said she was reschedule transportation to the earlier time.

## 2012-06-08 ENCOUNTER — Other Ambulatory Visit: Payer: Self-pay | Admitting: Internal Medicine

## 2012-06-08 MED ORDER — CHOLINE FENOFIBRATE 45 MG PO CPDR: 45.0000 mg | DELAYED_RELEASE_CAPSULE | Freq: Every day | ORAL | Status: DC

## 2012-06-08 MED ORDER — METOPROLOL TARTRATE 50 MG PO TABS: 25.0000 mg | ORAL_TABLET | Freq: Two times a day (BID) | ORAL | Status: DC

## 2012-06-11 ENCOUNTER — Ambulatory Visit (AMBULATORY_SURGERY_CENTER): Payer: Medicaid Other | Admitting: *Deleted

## 2012-06-11 VITALS — Ht 65.5 in | Wt 187.0 lb

## 2012-06-11 DIAGNOSIS — Z1211 Encounter for screening for malignant neoplasm of colon: Secondary | ICD-10-CM

## 2012-06-14 ENCOUNTER — Ambulatory Visit (HOSPITAL_COMMUNITY): Payer: Medicaid Other

## 2012-06-25 ENCOUNTER — Encounter: Payer: Self-pay | Admitting: Gastroenterology

## 2012-06-25 ENCOUNTER — Ambulatory Visit (AMBULATORY_SURGERY_CENTER): Payer: Medicaid Other | Admitting: Gastroenterology

## 2012-06-25 VITALS — BP 121/79 | HR 56 | Temp 98.5°F | Resp 22 | Ht 65.5 in | Wt 187.0 lb

## 2012-06-25 DIAGNOSIS — Z1211 Encounter for screening for malignant neoplasm of colon: Secondary | ICD-10-CM

## 2012-06-25 DIAGNOSIS — D126 Benign neoplasm of colon, unspecified: Secondary | ICD-10-CM

## 2012-06-25 MED ORDER — SODIUM CHLORIDE 0.9 % IV SOLN: 500.0000 mL | INTRAVENOUS | Status: DC

## 2012-06-25 NOTE — Progress Notes (Signed)
Patient did not have preoperative order for IV antibiotic SSI prophylaxis. (G8918)  Patient did not experience any of the following events: a burn prior to discharge; a fall within the facility; wrong site/side/patient/procedure/implant event; or a hospital transfer or hospital admission upon discharge from the facility. (G8907)  

## 2012-06-25 NOTE — Patient Instructions (Addendum)
YOU HAD AN ENDOSCOPIC PROCEDURE TODAY AT THE [facility]: Refer to the procedure report that was given to you for any specific questions about what was found during the examination.  If the procedure report does not answer your questions, please call your gastroenterologist to clarify.  If you requested that your care partner not be given the details of your procedure findings, then the procedure report has been included in a sealed envelope for you to review at your convenience later.  YOU SHOULD EXPECT: Some feelings of bloating in the abdomen. Passage of more gas than usual.  Walking can help get rid of the air that was put into your GI tract during the procedure and reduce the bloating. If you had a lower endoscopy (such as a colonoscopy or flexible sigmoidoscopy) you may notice spotting of blood in your stool or on the toilet paper. If you underwent a bowel prep for your procedure, then you may not have a normal bowel movement for a few days.  DIET: Your first meal following the procedure should be a light meal and then it is ok to progress to your normal diet.  A half-sandwich or bowl of soup is an example of a good first meal.  Heavy or fried foods are harder to digest and may make you feel nauseous or bloated.  Likewise meals heavy in dairy and vegetables can cause extra gas to form and this can also increase the bloating.  Drink plenty of fluids but you should avoid alcoholic beverages for 24 hours.  ACTIVITY: Your care partner should take you home directly after the procedure.  You should plan to take it easy, moving slowly for the rest of the day.  You can resume normal activity the day after the procedure however you should NOT DRIVE or use heavy machinery for 24 hours (because of the sedation medicines used during the test).    SYMPTOMS TO REPORT IMMEDIATELY: A gastroenterologist can be reached at any hour.  During normal business hours, 8:30 AM to 5:00 PM Monday through Friday,  call [phone].  After hours and on weekends, please call the GI answering service at [phone] who will take a message and have the physician on call contact you.   Following lower endoscopy (colonoscopy or flexible sigmoidoscopy):  Excessive amounts of blood in the stool  Significant tenderness or worsening of abdominal pains  Swelling of the abdomen that is new, acute  Fever of 100F or higher    FOLLOW UP: If any biopsies were taken you will be contacted by phone or by letter within the next 1-3 weeks.  Call your gastroenterologist if you have not heard about the biopsies in 3 weeks.  Our staff will call the home number listed on your records the next business day following your procedure to check on you and address any questions or concerns that you may have at that time regarding the information given to you following your procedure. This is a courtesy call and so if there is no answer at the home number and we have not heard from you through the emergency physician on call, we will assume that you have returned to your regular daily activities without incident.  SIGNATURES/CONFIDENTIALITY: You and/or your care partner have signed paperwork which will be entered into your electronic medical record.  These signatures attest to the fact that that the information above on your After Visit Summary has been reviewed and is understood.  Full responsibility of the confidentiality   of this discharge information lies with you and/or your care-partner.     Information on polyps & hemorrhoids given to you today  Resume Plavix today

## 2012-06-25 NOTE — Progress Notes (Signed)
Called to room to assist during endoscopic procedure.  Patient ID and intended procedure confirmed with present staff. Received instructions for my participation in the procedure from the performing physician.  

## 2012-06-25 NOTE — Op Note (Signed)
[facility] [address] [place], [address]   COLONOSCOPY PROCEDURE REPORT  PATIENT: Sandra, [name]  MR#: [id number] BIRTHDATE: 29-Jun-1960 , 51  yrs. old GENDER: Female ENDOSCOPIST: Meryl Dare, MD, [facility] REFERRED BY: Almyra Deforest, MD PROCEDURE DATE:  06/25/2012 PROCEDURE:   Colonoscopy with biopsy ASA CLASS:   Class III INDICATIONS:average risk screening. MEDICATIONS: MAC sedation, administered by CRNA and propofol (Diprivan) 300mg  IV DESCRIPTION OF PROCEDURE:   After the risks benefits and alternatives of the procedure were thoroughly explained, informed consent was obtained.  A digital rectal exam revealed no abnormalities of the rectum.   The LB [id number]  endoscope was introduced through the anus and advanced to the cecum, which was identified by both the appendix and ileocecal valve. No adverse events experienced.   The quality of the prep was excellent, using MoviPrep  The instrument was then slowly withdrawn as the colon was fully examined.  COLON FINDINGS: Two sessile polyps ranging between 3-78mm in size were found in the descending colon.  A polypectomy was performed with cold forceps.  The resection was complete and the polyp tissue was completely retrieved.   The colon was otherwise normal.  There was no diverticulosis, inflammation, polyps or cancers unless previously stated.  Retroflexed views revealed small internal hemorrhoids. The time to cecum=2 minutes 55 seconds.  Withdrawal time=9 minutes 56 seconds.  The scope was withdrawn and the procedure completed.  COMPLICATIONS: There were no complications.  ENDOSCOPIC IMPRESSION: 1.   Two sessile polyps measuring 3-5 mm in the descending colon; polypectomy performed with cold forceps 2.   Small internal hemorrhoids  RECOMMENDATIONS: 1.  Await pathology results 2.  Repeat colonoscopy in 5 years if polyp adenomatous; otherwise 10 years 3.  Resume Plavix today   eSigned:   Meryl Dare, MD, [facility] 06/25/2012 11:07 AM

## 2012-06-26 ENCOUNTER — Telehealth: Payer: Self-pay

## 2012-06-26 NOTE — Telephone Encounter (Signed)
Left message

## 2012-06-27 ENCOUNTER — Encounter: Payer: Self-pay | Admitting: Internal Medicine

## 2012-06-27 ENCOUNTER — Ambulatory Visit (INDEPENDENT_AMBULATORY_CARE_PROVIDER_SITE_OTHER): Payer: Medicaid Other | Admitting: Internal Medicine

## 2012-06-27 VITALS — BP 131/80 | HR 53 | Temp 98.2°F | Ht 65.0 in | Wt 185.5 lb

## 2012-06-27 DIAGNOSIS — I2510 Atherosclerotic heart disease of native coronary artery without angina pectoris: Secondary | ICD-10-CM

## 2012-06-27 DIAGNOSIS — Z20828 Contact with and (suspected) exposure to other viral communicable diseases: Secondary | ICD-10-CM

## 2012-06-27 DIAGNOSIS — I10 Essential (primary) hypertension: Secondary | ICD-10-CM

## 2012-06-27 DIAGNOSIS — Z206 Contact with and (suspected) exposure to human immunodeficiency virus [HIV]: Secondary | ICD-10-CM | POA: Insufficient documentation

## 2012-06-27 DIAGNOSIS — O09899 Supervision of other high risk pregnancies, unspecified trimester: Secondary | ICD-10-CM

## 2012-06-27 DIAGNOSIS — Z720 Tobacco use: Secondary | ICD-10-CM

## 2012-06-27 MED ORDER — NICOTINE 21 MG/24HR TD PT24: 1.0000 | MEDICATED_PATCH | TRANSDERMAL | Status: DC

## 2012-06-27 NOTE — Assessment & Plan Note (Signed)
Patient has HIV exposure to her husband who was found to be HIV positive at one month ago. Patient didn't use any barriers in her sexual activities before knowing her husband's HIV status. Patient is very anxious. Currently patient is completely asymptomatic. Will check HIV antibody. Patient was instructed to use condom in her sexual activities from now.

## 2012-06-27 NOTE — Progress Notes (Signed)
[name], female   DOB: 03/31/1961, 52 y.o.   MRN: [id number] Subjective:   [name] female   DOB: 02/22/1961 52 y.o.   MRN: [id number]  CC:  Check HIV HPI:  [name] is a 52 y.o. lady with past medical history as outlined below, who presents for an acute visit for checking her HIV status.  Patient is accompanied by her husband. She reports that her husband was found to be HIV-positive at approximately one month ago. They have not used any barriers in sexual activities before knowing her husband HIV status. Currently she is using condom for sexual activities. She does not have any symptoms. She is very concerned that she might be infected and wants to check her HIV status.  Regarding her CAD, patient does not have chest pain, shortness of breath, palpitation or leg edema.  Regarding her gout, she has history of gouty arthritis at feet bilaterally. It is well controlled with allopurinol. Today she does not have foot pain.  Denies fever, chills, fatigue, headaches,  cough, chest pain, SOB,  abdominal pain,diarrhea, constipation, dysuria, urgency, frequency, hematuria, joint pain or leg swelling.  [place] was reviewed.  Past Medical History  Diagnosis Date  . PFO (patent foramen ovale) August 2012    PFO seen on TEE during hospitalization in 01/2011.  Patient to f/u with Dr. Leonie Man, neurology, for enrollment in trial for medical treatment of PFO  . CAD (coronary artery disease) 2007    istory of MI with stent in 2007 by Dr. Chancy Milroy, [facility]. Stent placement by Dr Cleatis Polka   . Prediabetes 03/07/2011  . Hypertension 03/07/2011  . Hyperlipidemia 03/07/2011  . Tobacco abuse 03/07/2011  . Depression 03/07/2011  . Psoriasis 03/07/2011  . Acute right MCA stroke 01/27/11    Acute R MCA stroked  with L arm and leg weakness  . Anxiety 1980s    On Klonipin since age 30. Had addiction problem with Xanax which  Was therefore d/c . Seen in the past at [facility].   . Fractured toe 01/2011    History of left fifth toe proximal phalanx fracture when patient had a stroke (01/2011) and fell. Seen by Dr Doran Durand.   . Incontinence 04/14/2011  . Arthritis 04/16/2011  . Tobacco abuse 03/07/2011    Quit 01/2011   . Fracture of fifth toe, left, closed 01/28/2011    History of left fifth toe proximal phalanx fracture when patient had a stroke (01/2011) and fell. Seen by Dr Doran Durand.    . Substance abuse     h/o narcotic abuse pt denies as of 01/09/12  . Myocardial infarct, old 2008    stents   Current Outpatient Prescriptions  Medication Sig Dispense Refill  . allopurinol (ZYLOPRIM) 300 MG tablet TAKE ONE TABLET BY MOUTH ONE TIME DAILY  30 tablet  2  . amLODipine (NORVASC) 10 MG tablet Take 1 tablet (10 mg total) by mouth daily.      Marland Kitchen aspirin 81 MG tablet Take 1 tablet (81 mg total) by mouth daily.  30 tablet    . baclofen (LIORESAL) 10 MG tablet Take 1 tablet (10 mg total) by mouth daily.  30 tablet  0  . clonazePAM (KLONOPIN) 1 MG tablet Take 0.5 tablets (0.5 mg total) by mouth 3 (three) times daily as needed for anxiety.  60 tablet  2  . clopidogrel (PLAVIX) 75 MG tablet TAKE ONE TABLET BY MOUTH ONE TIME DAILY  30 tablet  2  . losartan (COZAAR) 100 MG tablet Take 1 tablet (100 mg total) by mouth daily.      . metoprolol (LOPRESSOR) 50 MG tablet Take 0.5 tablets (25 mg total) by mouth 2 (two) times daily.      . sertraline (ZOLOFT) 25 MG tablet Take 1 tablet (25 mg total) by mouth daily.  30 tablet  4  . solifenacin (VESICARE) 5 MG tablet Take 1 tablet (5 mg total) by mouth daily.  30 tablet  2  . zolpidem (AMBIEN) 5 MG tablet Take 1 tablet (5 mg total) by mouth at bedtime as needed for sleep.  30 tablet  1  . nicotine (NICODERM CQ - DOSED IN MG/24 HOURS) 21 mg/24hr patch Place 1 patch onto the skin daily.  30 patch  1  . [DISCONTINUED] solifenacin (VESICARE) 5 MG tablet Take 1 tablet (5 mg total) by mouth daily.  30 tablet  2   Family History  Problem  Relation Age of Onset  . Heart disease Mother   . Diabetes Mother   . Heart disease Father   . Diabetes Father   . Heart disease Brother   . Diabetes Brother   . Heart disease Maternal Grandmother   . Heart disease Maternal Grandfather   . Heart disease Paternal Grandmother   . Heart disease Paternal Grandfather   . Lung cancer Brother   . Heart disease Brother    History   Social History  . Marital Status: Married    Spouse Name: N/A    Number of Children: 1  . Years of Education: N/A   Occupational History  . Disabled    Social History Main Topics  . Smoking status: Current Every Day Smoker -- 0.5 packs/day    Types: Cigarettes  . Smokeless tobacco: Never Used  . Alcohol Use: No  . Drug Use: No  . Sexually Active: Yes   Other Topics Concern  . None   Social History Narrative  . None    Review of Systems: General: no fevers, chills, no changes in body weight, no changes in appetite Skin: no rash HEENT: no blurry vision, hearing changes or sore throat Pulm: no dyspnea, coughing, wheezing CV: no chest pain, palpitations, shortness of breath Abd: no nausea/vomiting, abdominal pain, diarrhea/constipation GU: no dysuria, hematuria, polyuria Ext: no arthralgias, myalgias Neuro: no weakness, numbness, or tingling  Objective:  Physical Exam: Filed Vitals:   06/27/12 1035  BP: 131/80  Pulse: 53  Temp: 98.2 F (36.8 C)  TempSrc: Oral  Height: 5' 5"  (1.651 m)  Weight: 185 lb 8 oz (84.142 kg)  SpO2: 97%   General: Not in acute distress HEENT: PERRL, EOMI, no scleral icterus, No bruit or JVD Cardiac: S1/S2, RRR, No murmurs, gallops or rubs Pulm: Good air movement bilaterally, Clear to auscultation bilaterally, No rales, wheezing, rhonchi or rubs. Abd: Soft,  nondistended, nontender, no rebound pain, no organomegaly, BS present Ext: No rashes or edema, 2+DP/PT pulse bilaterally Musculoskeletal: No joint deformities, erythema, or stiffness, ROM full and  nontender Skin: no rashes. No skin bruise. Neuro: Alert and oriented X3, cranial nerves II-XII grossly intact, muscle strength 5/5 in all extremeties,  sensation to light touch intact. Psych: patient is not psychotic, no suicidal or hemocidal ideation.    Assessment & Plan:

## 2012-06-27 NOTE — Patient Instructions (Signed)
1. I will let you know your HIV Test result when it comes back. Please use the condoms whenever you have sexual activities for now. 2. Please take all medications as prescribed.  3. If you have worsening of your symptoms or new symptoms arise, please call the clinic ([phone]), or go to the ER immediately if symptoms are severe.  You have done great job in taking all your medications. I appreciate it very much. Please continue doing that.

## 2012-06-27 NOTE — Assessment & Plan Note (Signed)
It is a stable. Today patient does not have chest pain, shortness of breath or palpitation. Will continue current regimen.

## 2012-06-27 NOTE — Assessment & Plan Note (Signed)
BP Readings from Last 3 Encounters:  06/27/12 131/80  06/25/12 121/79  05/29/12 130/80    Lab Results  Component Value Date   NA 142 01/27/2012   K 4.2 01/27/2012   CREATININE 0.78 01/27/2012    Assessment:  Blood pressure control: controlled  Progress toward BP goal:  at goal  Comments: Blood pressure is well controlled. We'll continue current regimen.  Plan:  Medications:  continue current medications  (amlodipine 10 mg daily, metoprolol 50 mg daily bid).  Educational resources provided: brochure  Self management tools provided: home blood pressure logbook (patient has not used it. )  Other plans: will continue current regimen.

## 2012-06-28 ENCOUNTER — Encounter: Payer: Self-pay | Admitting: Gastroenterology

## 2012-06-28 ENCOUNTER — Other Ambulatory Visit: Payer: Self-pay | Admitting: Internal Medicine

## 2012-06-29 ENCOUNTER — Other Ambulatory Visit: Payer: Self-pay | Admitting: Internal Medicine

## 2012-06-29 ENCOUNTER — Ambulatory Visit (HOSPITAL_COMMUNITY): Payer: Medicaid Other

## 2012-07-05 ENCOUNTER — Ambulatory Visit (HOSPITAL_COMMUNITY)
Admission: RE | Admit: 2012-07-05 | Discharge: 2012-07-05 | Disposition: A | Discharge status: Home / Self Care | Payer: Medicaid Other | Source: Ambulatory Visit | Attending: Internal Medicine | Admitting: Internal Medicine

## 2012-07-05 DIAGNOSIS — Z1231 Encounter for screening mammogram for malignant neoplasm of breast: Secondary | ICD-10-CM

## 2012-07-06 ENCOUNTER — Telehealth: Payer: Self-pay | Admitting: Gastroenterology

## 2012-07-06 ENCOUNTER — Telehealth: Payer: Self-pay | Admitting: *Deleted

## 2012-07-06 NOTE — Telephone Encounter (Signed)
Reviewed the results of the pathology with the patient and all questions were answered

## 2012-07-06 NOTE — Telephone Encounter (Signed)
Pt calls and states vesicare is not working at all, could you prescribe something else?

## 2012-07-10 ENCOUNTER — Other Ambulatory Visit: Payer: Self-pay | Admitting: Internal Medicine

## 2012-07-10 DIAGNOSIS — R928 Other abnormal and inconclusive findings on diagnostic imaging of breast: Secondary | ICD-10-CM

## 2012-07-11 ENCOUNTER — Other Ambulatory Visit: Payer: Self-pay | Admitting: *Deleted

## 2012-07-11 DIAGNOSIS — G4700 Insomnia, unspecified: Secondary | ICD-10-CM

## 2012-07-11 NOTE — Telephone Encounter (Signed)
Pt states Ambien is not strong enough.  She is not able to sleep through the night. She would like an increase.

## 2012-07-13 NOTE — Telephone Encounter (Signed)
Patient called but noted that she no longer had any trouble

## 2012-07-19 ENCOUNTER — Ambulatory Visit
Admission: RE | Admit: 2012-07-19 | Discharge: 2012-07-19 | Disposition: A | Discharge status: Home / Self Care | Payer: Medicaid Other | Source: Ambulatory Visit | Attending: Internal Medicine | Admitting: Internal Medicine

## 2012-07-19 DIAGNOSIS — R928 Other abnormal and inconclusive findings on diagnostic imaging of breast: Secondary | ICD-10-CM

## 2012-07-23 ENCOUNTER — Ambulatory Visit (INDEPENDENT_AMBULATORY_CARE_PROVIDER_SITE_OTHER): Payer: Medicaid Other | Admitting: Internal Medicine

## 2012-07-23 ENCOUNTER — Encounter: Payer: Self-pay | Admitting: Internal Medicine

## 2012-07-23 VITALS — BP 127/82 | HR 54 | Temp 97.5°F | Ht 65.0 in | Wt 184.2 lb

## 2012-07-23 DIAGNOSIS — Z720 Tobacco use: Secondary | ICD-10-CM

## 2012-07-23 DIAGNOSIS — G4700 Insomnia, unspecified: Secondary | ICD-10-CM

## 2012-07-23 DIAGNOSIS — F17200 Nicotine dependence, unspecified, uncomplicated: Secondary | ICD-10-CM

## 2012-07-23 DIAGNOSIS — E785 Hyperlipidemia, unspecified: Secondary | ICD-10-CM

## 2012-07-23 MED ORDER — BACLOFEN 10 MG PO TABS: 10.0000 mg | ORAL_TABLET | Freq: Every day | ORAL | Status: DC

## 2012-07-23 MED ORDER — ESZOPICLONE 2 MG PO TABS: 2.0000 mg | ORAL_TABLET | Freq: Every day | ORAL | Status: DC

## 2012-07-23 NOTE — Assessment & Plan Note (Signed)
Patient continues to smoke. Encouraged again smoking cessation. Underlined the importance considering her medical problem including stroke and coronary artery disease.

## 2012-07-23 NOTE — Progress Notes (Signed)
Subjective:   [name] female   DOB: Nov 17, 1960 52 y.o.   MRN: [id number]  HPI: Sandra [name] is a 52 y.o. female with PMH significant as outlined below who presented to the clinic for regular office visit. Her main concern today is that she would like to get a refill for her Ambien. She would prefer to increase it since she continues to have significant trouble staying asleep. She wakes up around 3:00 in the morning is not able to go to sleep. She has been using Ambien for very long time but now it is no longer working.  Patient continues to smoke. She reports that she stop smoking but due to significant stressors she restarted.    Past Medical History  Diagnosis Date  . PFO (patent foramen ovale) August 2012    PFO seen on TEE during hospitalization in 01/2011.  Patient to f/u with Dr. Pearlean Brownie, neurology, for enrollment in trial for medical treatment of PFO  . CAD (coronary artery disease) 2007    istory of MI with stent in 2007 by Dr. Park Breed, [facility] Stent placement by Dr Karoline Caldwell   . Prediabetes 03/07/2011  . Hypertension 03/07/2011  . Hyperlipidemia 03/07/2011  . Tobacco abuse 03/07/2011  . Depression 03/07/2011  . Psoriasis 03/07/2011  . Acute right MCA stroke 01/27/11    Acute R MCA stroked  with L arm and leg weakness  . Anxiety 1980s    On Klonipin since age 35. Had addiction problem with Xanax which  Was therefore d/c . Seen in the past at [facility]   . Fractured toe 01/2011    History of left fifth toe proximal phalanx fracture when patient had a stroke (01/2011) and fell. Seen by Dr Victorino Dike.   . Incontinence 04/14/2011  . Arthritis 04/16/2011  . Tobacco abuse 03/07/2011    Quit 01/2011   . Fracture of fifth toe, left, closed 01/28/2011    History of left fifth toe proximal phalanx fracture when patient had a stroke (01/2011) and fell. Seen by Dr Victorino Dike.    . Substance abuse     h/o narcotic abuse pt denies as of 01/09/12  . Myocardial infarct, old  2008    stents   Current Outpatient Prescriptions  Medication Sig Dispense Refill  . allopurinol (ZYLOPRIM) 300 MG tablet TAKE ONE TABLET BY MOUTH ONE TIME DAILY  30 tablet  2  . amLODipine (NORVASC) 10 MG tablet Take 1 tablet (10 mg total) by mouth daily.      Marland Kitchen aspirin 81 MG tablet Take 1 tablet (81 mg total) by mouth daily.  30 tablet    . baclofen (LIORESAL) 10 MG tablet Take 1 tablet (10 mg total) by mouth daily.  30 tablet  1  . clonazePAM (KLONOPIN) 1 MG tablet Take 0.5 tablets (0.5 mg total) by mouth 3 (three) times daily as needed for anxiety.  60 tablet  2  . clopidogrel (PLAVIX) 75 MG tablet TAKE ONE TABLET BY MOUTH ONE TIME DAILY  30 tablet  2  . eszopiclone (LUNESTA) 2 MG TABS Take 1 tablet (2 mg total) by mouth at bedtime. Take immediately before bedtime  30 tablet  0  . fenofibrate (TRICOR) 145 MG tablet Take 145 mg by mouth daily.      Marland Kitchen losartan (COZAAR) 100 MG tablet Take 1 tablet (100 mg total) by mouth daily.      . metoprolol (LOPRESSOR) 50 MG tablet Take 0.5 tablets (25 mg total) by mouth  2 (two) times daily.      . nicotine (NICODERM CQ - DOSED IN MG/24 HOURS) 21 mg/24hr patch Place 1 patch onto the skin daily.  30 patch  1  . sertraline (ZOLOFT) 25 MG tablet TAKE ONE TABLET BY MOUTH ONE TIME DAILY  30 tablet  0  . solifenacin (VESICARE) 5 MG tablet Take 1 tablet (5 mg total) by mouth daily.  30 tablet  2  . [DISCONTINUED] solifenacin (VESICARE) 5 MG tablet Take 1 tablet (5 mg total) by mouth daily.  30 tablet  2   Family History  Problem Relation Age of Onset  . Heart disease Mother   . Diabetes Mother   . Heart disease Father   . Diabetes Father   . Heart disease Brother   . Diabetes Brother   . Heart disease Maternal Grandmother   . Heart disease Maternal Grandfather   . Heart disease Paternal Grandmother   . Heart disease Paternal Grandfather   . Lung cancer Brother   . Heart disease Brother    History   Social History  . Marital Status: Married     Spouse Name: N/A    Number of Children: 1  . Years of Education: N/A   Occupational History  . Disabled    Social History Main Topics  . Smoking status: Current Every Day Smoker -- 0.5 packs/day    Types: Cigarettes  . Smokeless tobacco: Never Used  . Alcohol Use: No  . Drug Use: No  . Sexually Active: None   Other Topics Concern  . None   Social History Narrative  . None   Review of Systems: Constitutional: Denies fever, chills, diaphoresis, appetite change and fatigue.  Respiratory: Denies SOB, DOE, cough, chest tightness,  and wheezing.   Cardiovascular: Denies chest pain, palpitations and leg swelling.  Gastrointestinal: Denies nausea, vomiting, abdominal pain, diarrhea, constipation, blood in stool and abdominal distention.  Genitourinary: Denies dysuria, urgency, frequency, hematuria, flank pain and difficulty urinating.  Musculoskeletal: Denies myalgias, back pain, joint swelling, arthralgias and gait problem.  Neurological: Denies dizziness,weakness, light-headedness, numbness and headaches.  Psychiatric/Behavioral: Noted sleep disturbance  Objective:  Physical Exam: Filed Vitals:   07/23/12 1313  BP: 144/83  Pulse: 62  Temp: 97.5 F (36.4 C)  TempSrc: Oral  Height: 5\' 5"  (1.651 m)  Weight: 184 lb 3.2 oz (83.553 kg)  SpO2: 95%   Constitutional: Vital signs reviewed.  Patient is a well-developed and well-nourished female in no acute distress and cooperative with exam. Alert and oriented x3.  Neck: Supple Cardiovascular: RRR, S1 normal, S2 normal, no MRG, pulses symmetric and intact bilaterally Pulmonary/Chest: CTAB, no wheezes, rales, or rhonchi Abdominal: Soft. Non-tender, non-distended, bowel sounds are normal,  GU: no CVA tenderness Musculoskeletal: No joint deformities, erythema, or stiffness, ROM full and no nontender Neurological: A&O x3, , no focal motor deficit, sensory intact to light touch bilaterally.  Psychiatric: Normal mood and affect. speech  and behavior is normal.

## 2012-07-23 NOTE — Assessment & Plan Note (Signed)
Patient has long history of Insomnia . Ambien seems to not to work any more. I discussed sleep hygiene with her and provide a hand out. Furthermore I will start her on Lunesta. We will reevaluate her in 1 month .

## 2012-07-27 NOTE — Assessment & Plan Note (Signed)
02/29/2012:  She states that she has been taking her medications as directed and has been on Klonopin for "a very long time."  She states that the medication helps her a lot and that she is not having problems with panic attacks, SI, or HI at this time.  She is requesting a refill.  Her last refill was in August just over 30 days ago so we will refill her medication today until she can see her PCP in October.

## 2012-07-27 NOTE — Assessment & Plan Note (Signed)
02/29/2012:  Patient has clearing of her last gout flare and had her uric acid checked at the last appointment.  It was 10.1.  She denies any redness, warmth, or pain in her feet at this time and the decision was made to start allopurinol treatment to try to reduce the frequency of flares.  Her creatinine is okay so we will start her on 300 mg of allopurinol per day and recheck a uric acid level in a few weeks.  She was advised that occasionally when starting allopurinol that it can cause a flare and if that happens to take her naproxen.

## 2012-07-27 NOTE — Assessment & Plan Note (Signed)
02/29/2012:  BP Readings from Last 8 Encounters:  02/29/12 130/86  02/15/12 158/90  01/31/12 102/72  01/27/12 104/67  01/09/12 108/72  11/04/11 133/75  09/15/11 100/60  08/13/11 134/80   She states that she has been taking her blood pressure medication since Dr. Loistine Chance restarted it at her last appointment.  She denies any headaches, dizziness on standing, chest pain, or blurry vision.    Her BP today is well controlled below her goal of <140/80.  We will continue to monitor on her current regimen.

## 2012-07-30 ENCOUNTER — Encounter: Payer: Medicaid Other | Admitting: Internal Medicine

## 2012-08-03 ENCOUNTER — Telehealth: Payer: Self-pay | Admitting: Internal Medicine

## 2012-08-03 NOTE — Telephone Encounter (Signed)
INTERNAL MEDICINE RESIDENCY PROGRAM After-Hours Telephone Call    Reason for call:   I received a call from Sandra [name] on 08/03/2012 at 3:48 PM . Patient reports that she started to have sudden onset bilateral thigh pain at 2 pm today. Patient reports generalized weakness as well. Denies redness, swelling, or warm to touch. Denies fever, chills, chest pain, chest pressure, or SOB. Denies sick contact or long distance travel.     Pertinent Data:   She reports history of gout which was evaluated by Dr. Loistine Chance recently.   History of CAD and CVA on plavix    Assessment / Plan / Recommendations:   I encouraged patient to go to nearest emergency room for evaluation of bilateral thigh pain.  I told patient that she will need a physician exam and test to rule out DVT.   Patient agrees with the plan and will go to ED for evaluation.    Dede Query, MD   08/03/2012, 3:48 PM

## 2012-08-08 ENCOUNTER — Encounter: Payer: Self-pay | Admitting: *Deleted

## 2012-08-15 ENCOUNTER — Other Ambulatory Visit: Payer: Self-pay | Admitting: Internal Medicine

## 2012-08-15 ENCOUNTER — Other Ambulatory Visit: Payer: Self-pay | Admitting: *Deleted

## 2012-08-15 DIAGNOSIS — G4700 Insomnia, unspecified: Secondary | ICD-10-CM

## 2012-08-15 DIAGNOSIS — F32A Depression, unspecified: Secondary | ICD-10-CM

## 2012-08-15 DIAGNOSIS — F329 Major depressive disorder, single episode, unspecified: Secondary | ICD-10-CM

## 2012-08-15 MED ORDER — ESZOPICLONE 2 MG PO TABS: 2.0000 mg | ORAL_TABLET | Freq: Every day | ORAL | Status: DC

## 2012-08-15 NOTE — Telephone Encounter (Signed)
Rx called in to pharmacy. 

## 2012-08-16 MED ORDER — SERTRALINE HCL 25 MG PO TABS: 25.0000 mg | ORAL_TABLET | Freq: Every day | ORAL | Status: DC

## 2012-08-20 ENCOUNTER — Encounter: Payer: Medicaid Other | Admitting: Internal Medicine

## 2012-09-03 ENCOUNTER — Encounter: Payer: Medicaid Other | Admitting: Internal Medicine

## 2012-09-05 ENCOUNTER — Other Ambulatory Visit: Payer: Self-pay | Admitting: *Deleted

## 2012-09-05 ENCOUNTER — Other Ambulatory Visit: Payer: Self-pay | Admitting: Internal Medicine

## 2012-09-06 MED ORDER — NICOTINE 21 MG/24HR TD PT24: 1.0000 | MEDICATED_PATCH | TRANSDERMAL | Status: DC

## 2012-09-06 NOTE — Telephone Encounter (Signed)
Called to pharm 

## 2012-09-17 ENCOUNTER — Other Ambulatory Visit: Payer: Self-pay | Admitting: Internal Medicine

## 2012-09-18 NOTE — Telephone Encounter (Signed)
Called to pharm 

## 2012-10-10 ENCOUNTER — Other Ambulatory Visit: Payer: Self-pay | Admitting: Internal Medicine

## 2012-10-10 NOTE — Telephone Encounter (Signed)
Clonazepam rx called to [facility].

## 2012-10-13 ENCOUNTER — Telehealth: Payer: Self-pay | Admitting: Internal Medicine

## 2012-10-13 MED ORDER — DEXTROMETHORPHAN-GUAIFENESIN 10-100 MG/5ML PO SYRP: 5.0000 mL | ORAL_SOLUTION | Freq: Two times a day (BID) | ORAL | Status: DC | PRN

## 2012-10-13 NOTE — Telephone Encounter (Signed)
  INTERNAL MEDICINE RESIDENCY PROGRAM After-Hours Telephone Call    Reason for call:   I received a call from [name] at 6:30  PM indicating cough.    Pertinent Data:   The patient notes a 2-week history of congestion, described as significant nasal congestion.  She notes an occasional dry cough, with no fever.  Symptoms have remained stable or slightly worsened.  She notes no fevers.  She notes a sick contact of her husband's son with similar symptoms.  No nausea, vomiting, abdominal pain, or chest pain.    Assessment / Plan / Recommendations:   The patient's symptoms seem consistent with a viral URI, though the time course of symptoms of 2-weeks is a bit concerning.  In the absence of fever or purulent cough, I'm less concerned about bacterial infection.  The patient requests an antibiotic, but I believe a cough syrup would be more indicated at this time.  I'll send a note to our clinic front desk to try to schedule the patient an appointment in our clinic soon.  As always, pt is advised that if symptoms worsen or new symptoms arise, they should go to an urgent care facility or to to ER for further evaluation.    Sandra Headland, MD   10/13/2012, 6:25 PM

## 2012-10-13 NOTE — Addendum Note (Signed)
Addended by: Linward Headland on: 10/13/2012 06:51 PM   Modules accepted: Orders

## 2012-10-15 ENCOUNTER — Other Ambulatory Visit: Payer: Self-pay | Admitting: Internal Medicine

## 2012-10-15 NOTE — Telephone Encounter (Signed)
Called to pharm 

## 2012-10-18 ENCOUNTER — Ambulatory Visit (INDEPENDENT_AMBULATORY_CARE_PROVIDER_SITE_OTHER): Payer: Medicaid Other | Admitting: Internal Medicine

## 2012-10-18 ENCOUNTER — Encounter: Payer: Self-pay | Admitting: Internal Medicine

## 2012-10-18 VITALS — BP 144/91 | HR 60 | Temp 97.1°F | Ht 65.0 in | Wt 198.1 lb

## 2012-10-18 DIAGNOSIS — F411 Generalized anxiety disorder: Secondary | ICD-10-CM

## 2012-10-18 DIAGNOSIS — G4700 Insomnia, unspecified: Secondary | ICD-10-CM

## 2012-10-18 DIAGNOSIS — F17200 Nicotine dependence, unspecified, uncomplicated: Secondary | ICD-10-CM

## 2012-10-18 DIAGNOSIS — F419 Anxiety disorder, unspecified: Secondary | ICD-10-CM

## 2012-10-18 DIAGNOSIS — E785 Hyperlipidemia, unspecified: Secondary | ICD-10-CM

## 2012-10-18 DIAGNOSIS — R059 Cough, unspecified: Secondary | ICD-10-CM

## 2012-10-18 DIAGNOSIS — Z720 Tobacco use: Secondary | ICD-10-CM

## 2012-10-18 DIAGNOSIS — R05 Cough: Secondary | ICD-10-CM

## 2012-10-18 DIAGNOSIS — I10 Essential (primary) hypertension: Secondary | ICD-10-CM

## 2012-10-18 MED ORDER — ESZOPICLONE 2 MG PO TABS: 2.0000 mg | ORAL_TABLET | Freq: Every day | ORAL | Status: DC

## 2012-10-18 MED ORDER — CLONAZEPAM 0.5 MG PO TABS: 0.5000 mg | ORAL_TABLET | Freq: Two times a day (BID) | ORAL | Status: DC | PRN

## 2012-10-18 MED ORDER — AZITHROMYCIN 250 MG PO TABS: ORAL_TABLET | ORAL | Status: DC

## 2012-10-18 NOTE — Assessment & Plan Note (Signed)
Patient continues to take Klonopin 0.5 g 3 times a day even without symptoms. At this point I would decrease it to 2 times a day. I future goal is to discontinue completely. I would like to refer patient to the next office visit to counseling

## 2012-10-18 NOTE — Assessment & Plan Note (Signed)
Since patient continues to have symptoms without any improvement since [redacted] weeks along with shortness of breath and rhonchi and radials on physical exam I will start patient on antibiotic therapy at this point. Patient further continues to smoke which is another risk factor. Will reevaluate patient in 2 weeks. Patient may need to be referred to pulmonology to obtain a PFT considering patient's long history of smoking.

## 2012-10-18 NOTE — Assessment & Plan Note (Signed)
Patient continues to smoke. Counseled again about the importance of smoking cessation in the setting of coronary artery disease and CVA. I informed the husband about the importance in both will try his best.

## 2012-10-18 NOTE — Assessment & Plan Note (Signed)
Blood pressure elevated at in the setting of respiratory symptoms. At this point I will continue current regimen including metoprolol 50 mg, Norvasc 10 mg and Cozaar 100 mg daily. I will reevaluate patient in 2 weeks for possible pink changes in management.

## 2012-10-18 NOTE — Progress Notes (Signed)
Case discussed with Dr. Newt Lukes at time of visit, soon after the resident saw the patient.  We reviewed the resident's history and exam and pertinent patient test results.  I agree with the assessment, diagnosis, and plan of care documented in the resident's note.

## 2012-10-18 NOTE — Progress Notes (Signed)
Subjective:   [name] female   DOB: 12-Sep-1960 51 y.o.   MRN: [id number]  HPI: [name] is a 52 y.o. female with past medical history significant as outlined below who presented to the clinic with congestion, cough  runny nose since at least 2 weeks. Patient tried Mucinex as well as dextromethorphan for which Dextromethorphan-Guaifenesin  without any significant improvement. She is coughing not yellow sputum, feels somewhat short of breath and is not able to sleep at all. Patient called the clinic on 4/26 with similar symptoms. She noted her husband son had similar symptoms. Denies any recent travel or exposure to chemical or animals. Patient denies any history of allergies. Patient continues to smoke.  Anxiety: Patient takes clonidine 0.5 g 3 times a day which is down from in the past. Patient just takes it without any significant symptoms.  Insomnia; after starting patient on Lunesta and discontinue Ambien patient reports she has been sleeping better except now with the cough.  Past Medical History  Diagnosis Date  . PFO (patent foramen ovale) August 2012    PFO seen on TEE during hospitalization in 01/2011.  Patient to f/u with Dr. Pearlean Brownie, neurology, for enrollment in trial for medical treatment of PFO  . CAD (coronary artery disease) 2007    istory of MI with stent in 2007 by Dr. Park Breed, [facility]. Stent placement by Dr Karoline Caldwell   . Prediabetes 03/07/2011  . Hypertension 03/07/2011  . Hyperlipidemia 03/07/2011  . Tobacco abuse 03/07/2011  . Depression 03/07/2011  . Psoriasis 03/07/2011  . Acute right MCA stroke 01/27/11    Acute R MCA stroked  with L arm and leg weakness  . Anxiety 1980s    On Klonipin since age 65. Had addiction problem with Xanax which  Was therefore d/c . Seen in the past at [facility].   . Fractured toe 01/2011    History of left fifth toe proximal phalanx fracture when patient had a stroke (01/2011) and fell. Seen by Dr Victorino Dike.   .  Incontinence 04/14/2011  . Arthritis 04/16/2011  . Tobacco abuse 03/07/2011    Quit 01/2011   . Fracture of fifth toe, left, closed 01/28/2011    History of left fifth toe proximal phalanx fracture when patient had a stroke (01/2011) and fell. Seen by Dr Victorino Dike.    . Substance abuse     h/o narcotic abuse pt denies as of 01/09/12  . Myocardial infarct, old 2008    stents   Current Outpatient Prescriptions  Medication Sig Dispense Refill  . pravastatin (PRAVACHOL) 40 MG tablet Take 40 mg by mouth daily.      Marland Kitchen allopurinol (ZYLOPRIM) 300 MG tablet TAKE ONE TABLET BY MOUTH ONE TIME DAILY  30 tablet  2  . amLODipine (NORVASC) 10 MG tablet Take 1 tablet (10 mg total) by mouth daily.      Marland Kitchen aspirin 81 MG tablet Take 1 tablet (81 mg total) by mouth daily.  30 tablet    . azithromycin (ZITHROMAX) 250 MG tablet Take 2 tablets today and then one tablet every day for 4 days and then stop  6 each  0  . baclofen (LIORESAL) 10 MG tablet TAKE 1 TABLET BY MOUTH EVERY DAY  30 tablet  0  . clonazePAM (KLONOPIN) 0.5 MG tablet Take 1 tablet (0.5 mg total) by mouth 2 (two) times daily as needed for anxiety.  60 tablet  0  . clopidogrel (PLAVIX) 75 MG tablet TAKE 1  TABLET BY MOUTH EVERY DAY  30 tablet  2  . eszopiclone (LUNESTA) 2 MG TABS Take 1 tablet (2 mg total) by mouth at bedtime. Take immediately before bedtime  30 tablet  0  . fenofibrate (TRICOR) 145 MG tablet Take 145 mg by mouth daily.      Marland Kitchen losartan (COZAAR) 100 MG tablet Take 1 tablet (100 mg total) by mouth daily.      . metoprolol (LOPRESSOR) 50 MG tablet Take 0.5 tablets (25 mg total) by mouth 2 (two) times daily.      . sertraline (ZOLOFT) 25 MG tablet Take 1 tablet (25 mg total) by mouth daily.  30 tablet  1  . VESICARE 5 MG tablet TAKE 1 TABLET BY MOUTH EVERY DAY  30 tablet  1  . [DISCONTINUED] solifenacin (VESICARE) 5 MG tablet Take 1 tablet (5 mg total) by mouth daily.  30 tablet  2   No current facility-administered medications for this  visit.   Family History  Problem Relation Age of Onset  . Heart disease Mother   . Diabetes Mother   . Heart disease Father   . Diabetes Father   . Heart disease Brother   . Diabetes Brother   . Heart disease Maternal Grandmother   . Heart disease Maternal Grandfather   . Heart disease Paternal Grandmother   . Heart disease Paternal Grandfather   . Lung cancer Brother   . Heart disease Brother    History   Social History  . Marital Status: Married    Spouse Name: N/A    Number of Children: 1  . Years of Education: N/A   Occupational History  . Disabled    Social History Main Topics  . Smoking status: Current Every Day Smoker -- 0.50 packs/day    Types: Cigarettes  . Smokeless tobacco: Never Used     Comment: Not trying to cut back at this time.  . Alcohol Use: No  . Drug Use: No  . Sexually Active: None   Other Topics Concern  . None   Social History Narrative  . None   Review of Systems: Constitutional: Denies fever but noted chills, HEENT: Noted , congestion, rhinorrhea but denies sore throat,ear pain, mouth sores, trouble swallowing, neck pain, neck stiffness and tinnitus.   Respiratory: Noted mild SOB, DOE, cough, chest tightness Cardiovascular: Denies chest pain, palpitations and leg swelling.  Gastrointestinal: Denies nausea, vomiting, abdominal pain, diarrhea, constipation, blood in stool and abdominal distention.  Genitourinary: Denies dysuria, urgency, frequency, hematuria, flank pain and difficulty urinating.  Skin: Denies pallor, rash and wound.  Neurological: Denies dizziness,  Hematological: Denies adenopathy.   Objective:  Physical Exam: Filed Vitals:   10/18/12 0831 10/18/12 0844 10/18/12 0942  BP: 161/80 168/87 144/91  Pulse: 64 64 60  Temp: 97.1 F (36.2 C)    TempSrc: Oral    Height: 5\' 5"  (1.651 m)    Weight: 198 lb 1.6 oz (89.858 kg)    SpO2: 98%     Constitutional: Vital signs reviewed.  Patient is a well-developed and  well-nourished  in no acute distress and cooperative with exam. Alert and oriented x3.  Eyes: PERRL, EOMI, conjunctivae normal, No scleral icterus.  Neck: Supple, Cardiovascular: RRR, S1 normal, S2 normal, no MRG, pulses symmetric and intact bilaterally Pulmonary/Chest: Good air movement, but few rhonchi and radials at the bases present, no wheezing  Abdominal: Soft. Non-tender, non-distended, bowel sounds are normal,  Hematology: no cervical adenopathy.  Neurological: A&O x3, Strength  is normal and symmetric bilaterally,   Skin: Warm, dry and intact. No rash, cyanosis, or clubbing.

## 2012-10-19 ENCOUNTER — Telehealth: Payer: Self-pay | Admitting: Internal Medicine

## 2012-10-19 NOTE — Telephone Encounter (Signed)
INTERNAL MEDICINE RESIDENCY PROGRAM After-Hours Telephone Call    Reason for call:   I received a call from [name] on 10/19/2012 at 6:19 PM.  Patient reports the following acute issues.    1.  Bilateral thigh pain  Patient reports 3-hour gradual onset bilateral thigh constant pain.  Her pain is sharp pain, 7/10 with no radiation.  Denies redness, swelling or warmth to touch.  Denies lower leg or calf pain. Denies long distance travel.  Patient reports that she has had a similar pain in the past and baclofen was usually helpful. She took one baclofen one hour ago without any improvement.      2. vagina pain  Patient reports 2-hour gradual onset " vaginal pain".  She states that her pain is constant, sharp pain, 10/10, tenderness to touch, located between her legs with no radiation.  She denies any vaginal bleeding or discharge.  Last menstrual period was 3 years ago.  She reports that her pain becomes worse with ambulation and better with resting.  Denies any redness, swelling, or warmth to touch.  Denies fever, chills, skin rash, chest pain, chest pressure, shortness breath, nausea, vomiting, diarrhea, constipation.    She stated that she wants some stronger pain medications for her above pain symptoms     Pertinent Data:   She was last seen at the clinic by her PCP Dr. Almyra Deforest for cold symptoms.  She was given azithromycin pack and reports the compliance to her medical treatment.     Assessment / Plan / Recommendations:   I instructed her that I am unable to give her any narcotic pain medications after hour.  Given the nature of her pain symptoms, I instructed her to go to a nearest urgent care or emergency room for the evaluation of her pain.  Patient understands the plan and states that she will go to an emergency room.  As always, pt is advised that if symptoms worsen or new symptoms arise, they should go to an urgent care facility or to to ER for further evaluation.     Dede Query, MD   10/19/2012, 6:19 PM

## 2012-10-22 ENCOUNTER — Encounter: Payer: Medicaid Other | Admitting: Internal Medicine

## 2012-10-24 ENCOUNTER — Ambulatory Visit: Payer: Medicaid Other | Admitting: Internal Medicine

## 2012-10-24 ENCOUNTER — Telehealth: Payer: Self-pay | Admitting: *Deleted

## 2012-10-24 NOTE — Telephone Encounter (Signed)
Received faxed request from pt's pharmacy regarding the lunesta 75m tabs #30.  Request has been approved until 04-22-2013.GDespina HiddenCassady5/7/201410:37 AM

## 2012-11-01 ENCOUNTER — Encounter: Payer: Self-pay | Admitting: Internal Medicine

## 2012-11-01 ENCOUNTER — Encounter: Payer: Self-pay | Admitting: Licensed Clinical Social Worker

## 2012-11-01 ENCOUNTER — Ambulatory Visit (INDEPENDENT_AMBULATORY_CARE_PROVIDER_SITE_OTHER): Payer: Medicaid Other | Admitting: Internal Medicine

## 2012-11-01 VITALS — BP 195/90 | HR 63 | Temp 97.5°F | Wt 191.1 lb

## 2012-11-01 DIAGNOSIS — F419 Anxiety disorder, unspecified: Secondary | ICD-10-CM

## 2012-11-01 DIAGNOSIS — F411 Generalized anxiety disorder: Secondary | ICD-10-CM

## 2012-11-01 DIAGNOSIS — I10 Essential (primary) hypertension: Secondary | ICD-10-CM

## 2012-11-01 DIAGNOSIS — K5900 Constipation, unspecified: Secondary | ICD-10-CM

## 2012-11-01 DIAGNOSIS — F17200 Nicotine dependence, unspecified, uncomplicated: Secondary | ICD-10-CM

## 2012-11-01 DIAGNOSIS — Z720 Tobacco use: Secondary | ICD-10-CM

## 2012-11-01 DIAGNOSIS — F329 Major depressive disorder, single episode, unspecified: Secondary | ICD-10-CM

## 2012-11-01 MED ORDER — HYDROCHLOROTHIAZIDE 12.5 MG PO TABS: 12.5000 mg | ORAL_TABLET | Freq: Every day | ORAL | Status: DC

## 2012-11-01 MED ORDER — NICOTINE 21 MG/24HR TD PT24: 1.0000 | MEDICATED_PATCH | TRANSDERMAL | Status: DC

## 2012-11-01 MED ORDER — DOCUSATE SODIUM 100 MG PO CAPS: 100.0000 mg | ORAL_CAPSULE | Freq: Two times a day (BID) | ORAL | Status: DC

## 2012-11-01 NOTE — Patient Instructions (Addendum)
1. Please stop smoking. It is really important because it seems that your blood pressure reading up very dramatically with smoking. He just a med of time that she will have another stroke or heart attack so please STOP SMOKING !!!!!  2. I was started on new blood pressure medication: Hydrochlorothiazide 12.5 mg daily.  3. For constipation in you can take Colace 100 mg twice a day. Other stool softeners he can take over-the-counter is senna or MiraLax.  4. Please bring on a medication with you during the next office visit.

## 2012-11-01 NOTE — Assessment & Plan Note (Signed)
Patient is dependent on climbing for very long time. She is tolerating Klonopin  0.5 g twice a day. I'll refer patient to psychology and psychiatry evaluation to see if we could discontinue Klonopin. Patient is willing to do so. Referral is in place. Will make an appointment.

## 2012-11-01 NOTE — Assessment & Plan Note (Signed)
Blood pressure continues to be significant elevated. Patient continues to smoke and drank coffee this morning regardless considering patient's history of CVA hypertension I was not patient on hydrochlorothiazide 12.5 mg daily and reevaluate patient in 2 weeks. I instructed the patient if she is experiencing any headaches, weakness, chest pain or shortness of breath she needs to be about urged in the measurements as possible. BP Readings from Last 3 Encounters:  11/01/12 195/90  10/18/12 144/91  07/23/12 127/82

## 2012-11-01 NOTE — Progress Notes (Signed)
Subjective:   [name] female   DOB: 03-Apr-1961 52 y.o.   MRN: [id number]  HPI: [name] is a 52 y.o. female with PMH significant as outlined below who presented to the clinic for follow up thigh pain and vaginal pain. Patient was evaluated for bronchitis on 10/18/12. The patient reports that she has been doing fine except she noticed that she's significant constipated. Her vaginal pain and her back pain has resolved after she had bowel movement. Patient continues to smoke significantly. She reports last night she woke up at 3:00 in the morning she drank a lot of coffee and smoked.  Past Medical History  Diagnosis Date  . PFO (patent foramen ovale) August 2012    PFO seen on TEE during hospitalization in 01/2011.  Patient to f/u with Dr. Pearlean Brownie, neurology, for enrollment in trial for medical treatment of PFO  . CAD (coronary artery disease) 2007    istory of MI with stent in 2007 by Dr. Park Breed, [facility] Stent placement by Dr Karoline Caldwell   . Prediabetes 03/07/2011  . Hypertension 03/07/2011  . Hyperlipidemia 03/07/2011  . Tobacco abuse 03/07/2011  . Depression 03/07/2011  . Psoriasis 03/07/2011  . Acute right MCA stroke 01/27/11    Acute R MCA stroked  with L arm and leg weakness  . Anxiety 1980s    On Klonipin since age 56. Had addiction problem with Xanax which  Was therefore d/c . Seen in the past at [facility].   . Fractured toe 01/2011    History of left fifth toe proximal phalanx fracture when patient had a stroke (01/2011) and fell. Seen by Dr Victorino Dike.   . Incontinence 04/14/2011  . Arthritis 04/16/2011  . Tobacco abuse 03/07/2011    Quit 01/2011   . Fracture of fifth toe, left, closed 01/28/2011    History of left fifth toe proximal phalanx fracture when patient had a stroke (01/2011) and fell. Seen by Dr Victorino Dike.    . Substance abuse     h/o narcotic abuse pt denies as of 01/09/12  . Myocardial infarct, old 2008    stents   Current Outpatient Prescriptions   Medication Sig Dispense Refill  . allopurinol (ZYLOPRIM) 300 MG tablet TAKE ONE TABLET BY MOUTH ONE TIME DAILY  30 tablet  2  . amLODipine (NORVASC) 10 MG tablet Take 1 tablet (10 mg total) by mouth daily.      Marland Kitchen aspirin 81 MG tablet Take 1 tablet (81 mg total) by mouth daily.  30 tablet    . azithromycin (ZITHROMAX) 250 MG tablet Take 2 tablets today and then one tablet every day for 4 days and then stop  6 each  0  . baclofen (LIORESAL) 10 MG tablet TAKE 1 TABLET BY MOUTH EVERY DAY  30 tablet  0  . clonazePAM (KLONOPIN) 0.5 MG tablet Take 1 tablet (0.5 mg total) by mouth 2 (two) times daily as needed for anxiety.  60 tablet  0  . clopidogrel (PLAVIX) 75 MG tablet TAKE 1 TABLET BY MOUTH EVERY DAY  30 tablet  2  . docusate sodium (COLACE) 100 MG capsule Take 1 capsule (100 mg total) by mouth 2 (two) times daily.  60 capsule  1  . eszopiclone (LUNESTA) 2 MG TABS Take 1 tablet (2 mg total) by mouth at bedtime. Take immediately before bedtime  30 tablet  0  . fenofibrate (TRICOR) 145 MG tablet Take 145 mg by mouth daily.      . hydrochlorothiazide (  HYDRODIURIL) 12.5 MG tablet Take 1 tablet (12.5 mg total) by mouth daily.  30 tablet  2  . losartan (COZAAR) 100 MG tablet Take 1 tablet (100 mg total) by mouth daily.      . metoprolol (LOPRESSOR) 50 MG tablet Take 0.5 tablets (25 mg total) by mouth 2 (two) times daily.      . nicotine (NICODERM CQ - DOSED IN MG/24 HOURS) 21 mg/24hr patch Place 1 patch onto the skin daily.  28 patch  0  . pravastatin (PRAVACHOL) 40 MG tablet Take 40 mg by mouth daily.      . sertraline (ZOLOFT) 25 MG tablet Take 1 tablet (25 mg total) by mouth daily.  30 tablet  1  . VESICARE 5 MG tablet TAKE 1 TABLET BY MOUTH EVERY DAY  30 tablet  1  . [DISCONTINUED] solifenacin (VESICARE) 5 MG tablet Take 1 tablet (5 mg total) by mouth daily.  30 tablet  2   No current facility-administered medications for this visit.   Family History  Problem Relation Age of Onset  . Heart  disease Mother   . Diabetes Mother   . Heart disease Father   . Diabetes Father   . Heart disease Brother   . Diabetes Brother   . Heart disease Maternal Grandmother   . Heart disease Maternal Grandfather   . Heart disease Paternal Grandmother   . Heart disease Paternal Grandfather   . Lung cancer Brother   . Heart disease Brother    History   Social History  . Marital Status: Married    Spouse Name: N/A    Number of Children: 1  . Years of Education: N/A   Occupational History  . Disabled    Social History Main Topics  . Smoking status: Current Every Day Smoker -- 1.00 packs/day    Types: Cigarettes  . Smokeless tobacco: Never Used     Comment: Not trying to cut back at this time.  . Alcohol Use: No  . Drug Use: No  . Sexually Active: None   Other Topics Concern  . None   Social History Narrative  . None   Review of Systems: Constitutional: Denies fever, chills, diaphoresis, appetite change and fatigue.    Respiratory: Denies SOB, DOE, cough, chest tightness,  and wheezing.   Cardiovascular: Denies chest pain, palpitations and leg swelling.  Gastrointestinal: Denies nausea, vomiting, abdominal pain, diarrhea, constipation, blood in stool and abdominal distention.  Genitourinary: Denies dysuria, urgency, frequency, hematuria, flank pain and difficulty urinating.  Skin: Denies pallor, rash and wound.  Neurological: Denies dizziness,  weakness, light-headedness, numbness and headaches.    Objective:  Physical Exam: Filed Vitals:   11/01/12 0814 11/01/12 0849  BP: 187/90 195/90  Pulse: 64 63  Temp: 97.5 F (36.4 C)   TempSrc: Oral   Weight: 191 lb 1.6 oz (86.682 kg)   SpO2: 100%    Constitutional: Vital signs reviewed.  Patient is a well-developed and well-nourished female in no acute distress and cooperative with exam. Alert and oriented x3.  Eyes: PERRL, EOMI, conjunctivae normal, No scleral icterus.  Neck: Supple,  Cardiovascular: RRR, S1 normal, S2  normal, no MRG, pulses symmetric and intact bilaterally Pulmonary/Chest: CTAB, no wheezes, rales, or rhonchi Abdominal: Soft. Non-tender, non-distended, bowel sounds are normal, GU: no CVA tenderness  MSK: normal ROM, no joint tenderness, erythema  Hematology: no cervical adenopathy.  Neurological: A&O x3, Strength is normal and symmetric bilaterally, no focal motor deficit, sensory intact to light touch  bilaterally.  Skin: Warm, dry and intact. No rash, cyanosis, or clubbing.

## 2012-11-01 NOTE — Progress Notes (Signed)
[name] was referred to CSW for psychiatry referrals.  [name] lives in [place] and has [employer].  CSW met briefly with Ms. Price who had to leave immediately following [facility] appt due to transportation.  CSW confirmed pt lives in [place], phone number on chart is correct.  CSW attempt to contact agencies in [facility] that provide psychiatry/med management services.  [facility] and [facility] both offer med management but do not schedule first time appt's.  CSW will contact Ms. Price and determine pt's preference to utilize walk-in services for establishing care in [facility] or have a scheduled appt in [place].  Placed call to Ms. Price.  Discussed options above.  Pt would like appt scheduled at [facility] [phone].  Pt requested name, address and phone number as she schedules with transportation.  [name] states she can schedule her own appt so that she can work around transportation.  CSW will follow up with Ms. Price to confirm appt has been scheduled and send out ROI.

## 2012-11-01 NOTE — Assessment & Plan Note (Signed)
Patient continues to smoke. After patient noticed her high blood pressure she noted that she is ready to quit right now. Prescription for nicotine patches were sent to the pharmacy. She was instructed if she has any trouble is to obtain nicotine patches she should call the clinic for further instruction.

## 2012-11-05 ENCOUNTER — Encounter: Payer: Self-pay | Admitting: Licensed Clinical Social Worker

## 2012-11-05 NOTE — Progress Notes (Signed)
Case discussed with Dr. Newt Lukes soon after she evaluated the patient. We reviewed the resident's history and exam and pertinent patient test results. I agree with the assessment, diagnosis and plan of care documented in the resident's note. I agree with escalation of her antihypertensive regimen.  If she remains elevated will make further adjustments at follow-up.  Although smoking cessation will benefit her health on many levels, I doubt cessation will result in sudden control of her hypertension.

## 2012-11-11 ENCOUNTER — Other Ambulatory Visit: Payer: Self-pay | Admitting: Internal Medicine

## 2012-11-15 ENCOUNTER — Encounter: Payer: Self-pay | Admitting: Internal Medicine

## 2012-11-15 ENCOUNTER — Ambulatory Visit (INDEPENDENT_AMBULATORY_CARE_PROVIDER_SITE_OTHER): Payer: Medicaid Other | Admitting: Internal Medicine

## 2012-11-15 VITALS — BP 122/81 | HR 67 | Temp 97.6°F | Ht 65.0 in | Wt 197.9 lb

## 2012-11-15 DIAGNOSIS — Z206 Contact with and (suspected) exposure to human immunodeficiency virus [HIV]: Secondary | ICD-10-CM

## 2012-11-15 DIAGNOSIS — I10 Essential (primary) hypertension: Secondary | ICD-10-CM

## 2012-11-15 DIAGNOSIS — F419 Anxiety disorder, unspecified: Secondary | ICD-10-CM

## 2012-11-15 DIAGNOSIS — K5190 Ulcerative colitis, unspecified, without complications: Secondary | ICD-10-CM | POA: Insufficient documentation

## 2012-11-15 DIAGNOSIS — R197 Diarrhea, unspecified: Secondary | ICD-10-CM | POA: Insufficient documentation

## 2012-11-15 DIAGNOSIS — F411 Generalized anxiety disorder: Secondary | ICD-10-CM

## 2012-11-15 DIAGNOSIS — K5900 Constipation, unspecified: Secondary | ICD-10-CM

## 2012-11-15 DIAGNOSIS — F17200 Nicotine dependence, unspecified, uncomplicated: Secondary | ICD-10-CM

## 2012-11-15 DIAGNOSIS — Z20828 Contact with and (suspected) exposure to other viral communicable diseases: Secondary | ICD-10-CM

## 2012-11-15 DIAGNOSIS — Z720 Tobacco use: Secondary | ICD-10-CM

## 2012-11-15 NOTE — Assessment & Plan Note (Signed)
The patient reports that she has cut down significantly. She now just smokes 2 cigarettes a day. I congratulated her and informed her that she needs to quit as soon as possible. Patient noted she should more than ready

## 2012-11-15 NOTE — Progress Notes (Signed)
Subjective:   [name] female   DOB: 12-Jul-1960 52 y.o.   MRN: [id number]  HPI: [name] is a 52 y.o. female with past history significant as outlined below who presented to the clinic for a followup for blood pressure. Patient currently is on metoprolol 25 mg twice a day, Cozaar 100 mg daily, Norvasc 10 mg daily and was started on hydrochlorothiazide 12.5 mg daily. Patient noted that she is tolerating it well. Denies any dizziness, chest pain, shortness of breath.     Past Medical History  Diagnosis Date  . PFO (patent foramen ovale) August 2012    PFO seen on TEE during hospitalization in 01/2011.  Patient to f/u with Dr. Pearlean Brownie, neurology, for enrollment in trial for medical treatment of PFO  . CAD (coronary artery disease) 2007    istory of MI with stent in 2007 by Dr. Park Breed, [facility]. Stent placement by Dr Karoline Caldwell   . Prediabetes 03/07/2011  . Hypertension 03/07/2011  . Hyperlipidemia 03/07/2011  . Tobacco abuse 03/07/2011  . Depression 03/07/2011  . Psoriasis 03/07/2011  . Acute right MCA stroke 01/27/11    Acute R MCA stroked  with L arm and leg weakness  . Anxiety 1980s    On Klonipin since age 46. Had addiction problem with Xanax which  Was therefore d/c . Seen in the past at [facility].   . Fractured toe 01/2011    History of left fifth toe proximal phalanx fracture when patient had a stroke (01/2011) and fell. Seen by Dr Victorino Dike.   . Incontinence 04/14/2011  . Arthritis 04/16/2011  . Tobacco abuse 03/07/2011    Quit 01/2011   . Fracture of fifth toe, left, closed 01/28/2011    History of left fifth toe proximal phalanx fracture when patient had a stroke (01/2011) and fell. Seen by Dr Victorino Dike.    . Substance abuse     h/o narcotic abuse pt denies as of 01/09/12  . Myocardial infarct, old 2008    stents   Current Outpatient Prescriptions  Medication Sig Dispense Refill  . allopurinol (ZYLOPRIM) 300 MG tablet TAKE ONE TABLET BY MOUTH ONE TIME DAILY   30 tablet  2  . amLODipine (NORVASC) 10 MG tablet Take 1 tablet (10 mg total) by mouth daily.      Marland Kitchen aspirin 81 MG tablet Take 1 tablet (81 mg total) by mouth daily.  30 tablet    . azithromycin (ZITHROMAX) 250 MG tablet Take 2 tablets today and then one tablet every day for 4 days and then stop  6 each  0  . baclofen (LIORESAL) 10 MG tablet TAKE 1 TABLET BY MOUTH EVERY DAY  30 tablet  0  . clonazePAM (KLONOPIN) 0.5 MG tablet Take 1 tablet (0.5 mg total) by mouth 2 (two) times daily as needed for anxiety.  60 tablet  0  . clopidogrel (PLAVIX) 75 MG tablet TAKE 1 TABLET BY MOUTH EVERY DAY  30 tablet  2  . docusate sodium (COLACE) 100 MG capsule Take 1 capsule (100 mg total) by mouth 2 (two) times daily.  60 capsule  1  . eszopiclone (LUNESTA) 2 MG TABS Take 1 tablet (2 mg total) by mouth at bedtime. Take immediately before bedtime  30 tablet  0  . fenofibrate (TRICOR) 145 MG tablet Take 145 mg by mouth daily.      . hydrochlorothiazide (HYDRODIURIL) 12.5 MG tablet Take 1 tablet (12.5 mg total) by mouth daily.  30 tablet  2  .  losartan (COZAAR) 100 MG tablet Take 1 tablet (100 mg total) by mouth daily.      . metoprolol (LOPRESSOR) 50 MG tablet Take 0.5 tablets (25 mg total) by mouth 2 (two) times daily.      . nicotine (NICODERM CQ - DOSED IN MG/24 HOURS) 21 mg/24hr patch Place 1 patch onto the skin daily.  28 patch  0  . pravastatin (PRAVACHOL) 40 MG tablet Take 40 mg by mouth daily.      . sertraline (ZOLOFT) 25 MG tablet Take 1 tablet (25 mg total) by mouth daily.  30 tablet  1  . VESICARE 5 MG tablet TAKE 1 TABLET BY MOUTH EVERY DAY  30 tablet  3  . [DISCONTINUED] solifenacin (VESICARE) 5 MG tablet Take 1 tablet (5 mg total) by mouth daily.  30 tablet  2   No current facility-administered medications for this visit.   Family History  Problem Relation Age of Onset  . Heart disease Mother   . Diabetes Mother   . Heart disease Father   . Diabetes Father   . Heart disease Brother   .  Diabetes Brother   . Heart disease Maternal Grandmother   . Heart disease Maternal Grandfather   . Heart disease Paternal Grandmother   . Heart disease Paternal Grandfather   . Lung cancer Brother   . Heart disease Brother    History   Social History  . Marital Status: Married    Spouse Name: N/A    Number of Children: 1  . Years of Education: N/A   Occupational History  . Disabled    Social History Main Topics  . Smoking status: Current Every Day Smoker -- 0.20 packs/day    Types: Cigarettes  . Smokeless tobacco: Never Used     Comment: 2 cigs/day  . Alcohol Use: No  . Drug Use: No  . Sexually Active: None   Other Topics Concern  . None   Social History Narrative  . None   Review of Systems: Constitutional: Denies fever, chills, diaphoresis, appetite change and fatigue.    Respiratory: Denies SOB, DOE, cough, chest tightness,  and wheezing.   Cardiovascular: Denies chest pain, palpitations and leg swelling.  Gastrointestinal: Denies nausea, vomiting, abdominal pain, diarrhea, blood in stool and abdominal distention.  Neurological: Denies dizziness,  weakness,   Objective:  Physical Exam: Filed Vitals:   11/15/12 0821  BP: 122/81  Pulse: 67  Temp: 97.6 F (36.4 C)  TempSrc: Oral  Height: 5\' 5"  (1.651 m)  Weight: 197 lb 14.4 oz (89.767 kg)  SpO2: 98%   Constitutional: Vital signs reviewed.  Patient is a well-developed and well-nourished female in no acute distress and cooperative with exam. Alert and oriented x3.   Neck: Supple,  Cardiovascular: RRR, S1 normal, S2 normal, no MRG, pulses symmetric and intact bilaterally, trace edema in LE bilaterally L>R Pulmonary/Chest: normal respiratory effort, CTAB, no wheezes, rales, or rhonchi Abdominal: Soft. Non-tender, non-distended, bowel sounds are normal,  Neurological: A&O x3

## 2012-11-15 NOTE — Assessment & Plan Note (Signed)
Blood pressure at goal.  Will continue metoprolol 25 mg daily, losartan 100 mg daily and hydrochlorothiazide 12.5 mg daily as well as Norvasc 10 mg daily Will obtain basic metabolic panel today to monitor her electrolytes and renal function.

## 2012-11-15 NOTE — Assessment & Plan Note (Signed)
Patient was started on Colace during the last office visit. Discussed about increased fiber intake and fluid intake. Patient reports she has tried Dulcolax which is somewhat helping.

## 2012-11-15 NOTE — Progress Notes (Signed)
Case discussed with Dr. Jaseela Illath  at the time of the visit, immediately after the resident saw the patient.  I reviewed the resident's history and exam and pertinent patient test results.  I agree with the assessment, diagnosis and plan of care documented in the resident's note.   

## 2012-11-16 LAB — BASIC METABOLIC PANEL WITH GFR
BUN: 15 mg/dL (ref 6–23)
Calcium: 9.4 mg/dL (ref 8.4–10.5)
GFR, Est African American: >89 mL/min
GFR, Est Non African American: >89 mL/min
Glucose, Bld: 112 mg/dL — ABNORMAL HIGH (ref 70–99)
Potassium: 3.9 mEq/L (ref 3.5–5.3)
Sodium: 142 mEq/L (ref 135–145)

## 2012-11-20 ENCOUNTER — Other Ambulatory Visit: Payer: Self-pay | Admitting: *Deleted

## 2012-11-20 DIAGNOSIS — F419 Anxiety disorder, unspecified: Secondary | ICD-10-CM

## 2012-11-20 DIAGNOSIS — G4700 Insomnia, unspecified: Secondary | ICD-10-CM

## 2012-11-20 MED ORDER — BACLOFEN 10 MG PO TABS: 10.0000 mg | ORAL_TABLET | Freq: Every day | ORAL | Status: DC

## 2012-11-20 MED ORDER — CLONAZEPAM 0.5 MG PO TABS: 0.5000 mg | ORAL_TABLET | Freq: Two times a day (BID) | ORAL | Status: DC | PRN

## 2012-11-20 MED ORDER — ESZOPICLONE 2 MG PO TABS: 2.0000 mg | ORAL_TABLET | Freq: Every day | ORAL | Status: DC

## 2012-11-20 NOTE — Telephone Encounter (Signed)
Pt states she will not see the psychiatrist for 2 weeks, she has only seen the therapist so they will not fill med until she does see the psychiatrist

## 2012-11-20 NOTE — Telephone Encounter (Signed)
Called to pharm 

## 2012-11-29 ENCOUNTER — Telehealth: Payer: Self-pay | Admitting: *Deleted

## 2012-11-29 NOTE — Telephone Encounter (Signed)
Pt calls and states she has really bad constipation and has tried everything you recommended and dulcolax and nothing has worked, please advise may call at [phone]

## 2012-11-30 ENCOUNTER — Telehealth: Payer: Self-pay | Admitting: Internal Medicine

## 2012-11-30 NOTE — Telephone Encounter (Signed)
Patient called due to sever constipation. I left a message to call back I would advice to use enema , continue colace and start Miralax or Senna as well.  She should continue to eat fiber and drink water.

## 2012-12-06 ENCOUNTER — Ambulatory Visit (INDEPENDENT_AMBULATORY_CARE_PROVIDER_SITE_OTHER): Payer: Medicaid Other | Admitting: Internal Medicine

## 2012-12-06 ENCOUNTER — Ambulatory Visit (HOSPITAL_COMMUNITY)
Admission: RE | Admit: 2012-12-06 | Discharge: 2012-12-06 | Disposition: A | Discharge status: Home / Self Care | Payer: Medicaid Other | Source: Ambulatory Visit | Attending: Internal Medicine | Admitting: Internal Medicine

## 2012-12-06 DIAGNOSIS — F419 Anxiety disorder, unspecified: Secondary | ICD-10-CM

## 2012-12-06 DIAGNOSIS — F411 Generalized anxiety disorder: Secondary | ICD-10-CM

## 2012-12-06 DIAGNOSIS — Z8673 Personal history of transient ischemic attack (TIA), and cerebral infarction without residual deficits: Secondary | ICD-10-CM

## 2012-12-06 DIAGNOSIS — M47817 Spondylosis without myelopathy or radiculopathy, lumbosacral region: Secondary | ICD-10-CM | POA: Insufficient documentation

## 2012-12-06 DIAGNOSIS — M549 Dorsalgia, unspecified: Secondary | ICD-10-CM

## 2012-12-06 DIAGNOSIS — M51379 Other intervertebral disc degeneration, lumbosacral region without mention of lumbar back pain or lower extremity pain: Secondary | ICD-10-CM | POA: Insufficient documentation

## 2012-12-06 DIAGNOSIS — M545 Low back pain, unspecified: Secondary | ICD-10-CM | POA: Insufficient documentation

## 2012-12-06 DIAGNOSIS — M5137 Other intervertebral disc degeneration, lumbosacral region: Secondary | ICD-10-CM | POA: Insufficient documentation

## 2012-12-06 DIAGNOSIS — W19XXXA Unspecified fall, initial encounter: Secondary | ICD-10-CM | POA: Insufficient documentation

## 2012-12-06 DIAGNOSIS — K5900 Constipation, unspecified: Secondary | ICD-10-CM

## 2012-12-06 MED ORDER — BACLOFEN 10 MG PO TABS: 10.0000 mg | ORAL_TABLET | Freq: Every day | ORAL | Status: DC

## 2012-12-06 MED ORDER — CLONAZEPAM 0.5 MG PO TABS: 0.5000 mg | ORAL_TABLET | Freq: Two times a day (BID) | ORAL | Status: DC | PRN

## 2012-12-06 MED ORDER — NAPROXEN 500 MG PO TBEC: 500.0000 mg | DELAYED_RELEASE_TABLET | Freq: Two times a day (BID) | ORAL | Status: AC

## 2012-12-06 NOTE — Patient Instructions (Signed)
You were seen in clinic today for back pain and constipation.  For your constipation, continue to take docusate (Colace) one tablet twice a day. Take Dulcolax 1-3 tablets by mouth daily until you are having regular bowel movements. Stay well hydrated with water and maintain a diet with sufficient vegetables and fruits. Once you are having regular bowel movements, you may discontinue the Dulcolax and only use if you go 4 days without a bowel movement.  For your back pain, we are prescribing you a pain medication called Naproxen, to be taken 1 pill twice a day with meals. We are also going to obtain an x-ray of your lower back today.   If your back pain is not better in one week or is getting worse, please seek medical attention. Please follow up with [place] in one month.  Back Pain, Adult Low back pain is very common. About 1 in 5 people have back pain.The cause of low back pain is rarely dangerous. The pain often gets better over time.About half of people with a sudden onset of back pain feel better in just 2 weeks. About 8 in 10 people feel better by 6 weeks.  CAUSES Some common causes of back pain include:  Strain of the muscles or ligaments supporting the spine.  Wear and tear (degeneration) of the spinal discs.  Arthritis.  Direct injury to the back. DIAGNOSIS Most of the time, the direct cause of low back pain is not known.However, back pain can be treated effectively even when the exact cause of the pain is unknown.Answering your caregiver's questions about your overall health and symptoms is one of the most accurate ways to make sure the cause of your pain is not dangerous. If your caregiver needs more information, he or she may order lab work or imaging tests (X-rays or MRIs).However, even if imaging tests show changes in your back, this usually does not require surgery. HOME CARE INSTRUCTIONS For many people, back pain returns.Since low back pain is rarely dangerous, it is often  a condition that people can learn to [facility] their own.   Remain active. It is stressful on the back to sit or stand in one place. Do not sit, drive, or stand in one place for more than 30 minutes at a time. Take short walks on level surfaces as soon as pain allows.Try to increase the length of time you walk each day.  Do not stay in bed.Resting more than 1 or 2 days can delay your recovery.  Do not avoid exercise or work.Your body is made to move.It is not dangerous to be active, even though your back may hurt.Your back will likely heal faster if you return to being active before your pain is gone.  Pay attention to your body when you bend and lift. Many people have less discomfortwhen lifting if they bend their knees, keep the load close to their bodies,and avoid twisting. Often, the most comfortable positions are those that put less stress on your recovering back.  Find a comfortable position to sleep. Use a firm mattress and lie on your side with your knees slightly bent. If you lie on your back, put a pillow under your knees.  Only take over-the-counter or prescription medicines as directed by your caregiver. Over-the-counter medicines to reduce pain and inflammation are often the most helpful.Your caregiver may prescribe muscle relaxant drugs.These medicines help dull your pain so you can more quickly return to your normal activities and healthy exercise.  Put ice on  the injured area.  Put ice in a plastic bag.  Place a towel between your skin and the bag.  Leave the ice on for 15-20 minutes, 3-4 times a day for the first 2 to 3 days. After that, ice and heat may be alternated to reduce pain and spasms.  Ask your caregiver about trying back exercises and gentle massage. This may be of some benefit.  Avoid feeling anxious or stressed.Stress increases muscle tension and can worsen back pain.It is important to recognize when you are anxious or stressed and learn ways to  manage it.Exercise is a great option. SEEK MEDICAL CARE IF:  You have pain that is not relieved with rest or medicine.  You have pain that does not improve in 1 week.  You have new symptoms.  You are generally not feeling well. SEEK IMMEDIATE MEDICAL CARE IF:   You have pain that radiates from your back into your legs.  You develop new bowel or bladder control problems.  You have unusual weakness or numbness in your arms or legs.  You develop nausea or vomiting.  You develop abdominal pain.  You feel faint. Document Released: 06/06/2005 Document Revised: 12/06/2011 Document Reviewed: 10/25/2010 [facility] Patient Information 2014 [place], [place].

## 2012-12-06 NOTE — Progress Notes (Signed)
This is a Psychologist, occupational Note.  The care of the patient was discussed with Dr. Rogelia Boga and the assessment and plan was formulated with their assistance.  Please see their note for official documentation of the patient encounter.   Subjective:   [name] female   DOB: 07-08-1960 52 y.o.   MRN: [id number]  HPI: Sandra [name] is a 52 y.o. female with a history of HTN, HLD, CAD s/p stent, and right MCA stroke presenting for constipation and back pain. With respect to her constipation, the patient reports having had only 1 bowel movement in the past 3 weeks, this occurring Sunday (4 days ago). She has a history of constipation in the past but is normally able to have a bowel movement every other day. Her risk factors include baclofen and Vesicare use. She is prescribed docusate twice daily, but has not been taking this medication regularly. She was able to have her last bowel movement by taking multiple doses of Dulcolax (bisacodyl). She admits to some abdominal discomfort, but this seems more related to her back pain. She is eating well without nausea or vomiting and is able to pass flatus.  With respect to back pain, the patient fell backwards onto her buttocks 2 weeks ago after slipping on water in the kitchen. She is at risk for falls secondary to residual left sided weakness from right MCA stroke 01/2011. The current pain has been getting worse for the past 2 weeks and is currently an 8/10 stabbing pain at worst. The pain is worse on her right side, radiating to her right groin at times. Pain is made worse by trying to rise from bed or a chair. It is better lying down. She has not taken any pain medication. Constipation preceded the fall and she denies changes in bladder habits or saddle anesthesia. Patient's gait is not regular since her stroke, but she reports it is worse since the fall.   Past Medical History  Diagnosis Date  . PFO (patent foramen ovale) August 2012    PFO seen on  TEE during hospitalization in 01/2011.  Patient to f/u with Dr. Pearlean Brownie, neurology, for enrollment in trial for medical treatment of PFO  . CAD (coronary artery disease) 2007    istory of MI with stent in 2007 by Dr. Park Breed, [facility]. Stent placement by Dr Karoline Caldwell   . Prediabetes 03/07/2011  . Hypertension 03/07/2011  . Hyperlipidemia 03/07/2011  . Tobacco abuse 03/07/2011  . Depression 03/07/2011  . Psoriasis 03/07/2011  . Acute right MCA stroke 01/27/11    Acute R MCA stroked  with L arm and leg weakness  . Anxiety 1980s    On Klonipin since age 37. Had addiction problem with Xanax which  Was therefore d/c . Seen in the past at [facility].   . Fractured toe 01/2011    History of left fifth toe proximal phalanx fracture when patient had a stroke (01/2011) and fell. Seen by Dr Victorino Dike.   . Incontinence 04/14/2011  . Arthritis 04/16/2011  . Tobacco abuse 03/07/2011    Quit 01/2011   . Fracture of fifth toe, left, closed 01/28/2011    History of left fifth toe proximal phalanx fracture when patient had a stroke (01/2011) and fell. Seen by Dr Victorino Dike.    . Substance abuse     h/o narcotic abuse pt denies as of 01/09/12  . Myocardial infarct, old 2008    stents   Current Outpatient Prescriptions  Medication Sig  Dispense Refill  . allopurinol (ZYLOPRIM) 300 MG tablet TAKE ONE TABLET BY MOUTH ONE TIME DAILY  30 tablet  2  . amLODipine (NORVASC) 10 MG tablet Take 1 tablet (10 mg total) by mouth daily.      Marland Kitchen aspirin 81 MG tablet Take 1 tablet (81 mg total) by mouth daily.  30 tablet    . baclofen (LIORESAL) 10 MG tablet Take 1 tablet (10 mg total) by mouth daily.  30 tablet  0  . Choline Fenofibrate 45 MG capsule Take 45 mg by mouth daily.      . clonazePAM (KLONOPIN) 0.5 MG tablet Take 1 tablet (0.5 mg total) by mouth 2 (two) times daily as needed for anxiety.  60 tablet  0  . clopidogrel (PLAVIX) 75 MG tablet TAKE 1 TABLET BY MOUTH EVERY DAY  30 tablet  2  . docusate sodium (COLACE) 100 MG  capsule Take 1 capsule (100 mg total) by mouth 2 (two) times daily.  60 capsule  1  . eszopiclone (LUNESTA) 2 MG TABS Take 1 tablet (2 mg total) by mouth at bedtime. Take immediately before bedtime  30 tablet  2  . hydrochlorothiazide (HYDRODIURIL) 12.5 MG tablet Take 1 tablet (12.5 mg total) by mouth daily.  30 tablet  2  . losartan (COZAAR) 100 MG tablet Take 1 tablet (100 mg total) by mouth daily.      . metoprolol (LOPRESSOR) 50 MG tablet Take 0.5 tablets (25 mg total) by mouth 2 (two) times daily.      . nicotine (NICODERM CQ - DOSED IN MG/24 HOURS) 21 mg/24hr patch Place 1 patch onto the skin daily.  28 patch  0  . pravastatin (PRAVACHOL) 40 MG tablet Take 40 mg by mouth daily.      . sertraline (ZOLOFT) 25 MG tablet Take 1 tablet (25 mg total) by mouth daily.  30 tablet  1  . VESICARE 5 MG tablet TAKE 1 TABLET BY MOUTH EVERY DAY  30 tablet  3  . [DISCONTINUED] solifenacin (VESICARE) 5 MG tablet Take 1 tablet (5 mg total) by mouth daily.  30 tablet  2   No current facility-administered medications for this visit.   Family History  Problem Relation Age of Onset  . Heart disease Mother   . Diabetes Mother   . Heart disease Father   . Diabetes Father   . Heart disease Brother   . Diabetes Brother   . Heart disease Maternal Grandmother   . Heart disease Maternal Grandfather   . Heart disease Paternal Grandmother   . Heart disease Paternal Grandfather   . Lung cancer Brother   . Heart disease Brother    History   Social History  . Marital Status: Married    Spouse Name: N/A    Number of Children: 1  . Years of Education: N/A   Occupational History  . Disabled    Social History Main Topics  . Smoking status: Current Every Day Smoker -- 0.20 packs/day    Types: Cigarettes  . Smokeless tobacco: Never Used     Comment: 2 cigs/day  . Alcohol Use: No  . Drug Use: No  . Sexually Active: Not on file   Other Topics Concern  . Not on file   Social History Narrative  . No  narrative on file   Review of Systems: A comprehensive 12 point review of systems was performed and is negative except as stated above. Objective:  Physical Exam: There were no vitals  filed for this visit. General appearance: alert, cooperative, no distress and seated in wheel chair Head: Normocephalic, without obvious abnormality, atraumatic Throat: moist mucus membranes of oropharynx. Dentures in place. Neck: no adenopathy and supple, symmetrical, trachea midline Lungs: lung sounds diffusely distant but equal and otherwise clear bilaterally. Heart: regular rate and rhythm, S1, S2 normal, no murmur, click, rub or gallop Abdomen: obese, soft, nondistended. tender to palpation in epigastric and right lower quadrant regions. bowel sounds sparse. Extremities: extremities normal, atraumatic, no cyanosis or edema Pulses: 2+ and symmetric MSK: pain in groin then back to straight leg raise both legs but R>L. Groin pain on internal rotation of right leg. Neuro: Gait is unsteady but it is difficult to assess how much is due to pain vs residual weakness in left leg from stroke. Assessment & Plan:  Sandra [name] is a 52 y.o. female with a history of Right MCA stroke, CAD, and HTN presenting with constipation and back pain.  Unspecified constipation Only one bowel movement in 3 weeks, and patient has not been taking docusate as prescribed. She was able to have one bowel movement with the use of dulcolax. She is at higher risk for constipation secondary to use of baclofen and Vesicare. - Resume docusate 100 mg BID as instructed. - Patient may take Dulcolax (bisacodyl) as directed until she is having regular bowel movements. - Following return of regular bowel movements, the patient should continue to take docusate BID and reserve dulcolax for lack of bowel movement of 4 days duration.  Acute back pain less than 4 weeks duration Dg Lumbar Spine 2-3 Views  12/06/2012   *RADIOLOGY REPORT*  Clinical Data:  Fall 2 weeks ago.  Back pain  LUMBAR SPINE - 2-3 VIEW  Comparison: 05/16/2008  Findings: 2 mm anterior slip L4-5, progressed from the prior study. Progressive facet degeneration at L4-5. Mild disc space narrowing L5-S1.  Negative for fracture or mass.  No pars defect.  IMPRESSION: Progressive disc and facet degeneration at L4-5.  Mild progression of disc degeneration L5-S1.  Negative for fracture.   Original Report Authenticated By: Janeece Riggers, M.D.   Patient experienced a mechanical fall, of which she was at risk due to residual left sided weakness from right MCA stroke and at the time slippery floor. Her back pain, R>L and radiating to the groin, has been progressively getting worse. Constipation preceded back pain, denies saddle anesthesia or disturbance in bladder habits. Patient's baclofen and clonazepam are likely already helping with any muscle discomfort. She is not a candidate for narcotic analgesics with a history of narcotic abuse. Given her history of CAD and tobacco abuse, naproxen will be the safest NSAID for analgesia.  - X-Ray lumbar spine, impression above. - Naproxen 500mg  BID x 2 weeks as needed for pain.    FOLLOW UP: Return visit in 2 months. Call back if not improved in 1 week.

## 2012-12-06 NOTE — Assessment & Plan Note (Addendum)
Only one bowel movement in 3 weeks, and patient has not been taking docusate as prescribed. She was able to have one bowel movement with the use of dulcolax. She is at higher risk for constipation secondary to use of baclofen and Vesicare. - Resume docusate 100 mg BID as instructed. - Patient may take Dulcolax (bisacodyl) as directed until she is having regular bowel movements. - Following return of regular bowel movements, the patient should continue to take docusate BID and reserve dulcolax for lack of bowel movement of 4 days duration.    Attending A&P: Pt has recently had a colon that R/O cancer. Therefore, conservative tx. MS III Mellody Dance has instructed pt in water and dietary changes that will be beneficial. Colace BID QD with Ducolax as needed for regular BM.

## 2012-12-06 NOTE — Assessment & Plan Note (Addendum)
Dg Lumbar Spine 2-3 Views  12/06/2012   *RADIOLOGY REPORT*  Clinical Data: Fall 2 weeks ago.  Back pain  LUMBAR SPINE - 2-3 VIEW  Comparison: 05/16/2008  Findings: 2 mm anterior slip L4-5, progressed from the prior study. Progressive facet degeneration at L4-5. Mild disc space narrowing L5-S1.  Negative for fracture or mass.  No pars defect.  IMPRESSION: Progressive disc and facet degeneration at L4-5.  Mild progression of disc degeneration L5-S1.  Negative for fracture.   Original Report Authenticated By: Janeece Riggers, M.D.   Patient experienced a mechanical fall, of which she was at risk due to residual left sided weakness from right MCA stroke and at the time slippery floor. Her back pain, R>L and radiating to the groin, has been progressively getting worse. Constipation preceded back pain, denies saddle anesthesia or disturbance in bladder habits. Patient's baclofen and clonazepam are likely already helping with any muscle discomfort. She is not a candidate for narcotic analgesics with a history of narcotic abuse. Given her history of CAD and tobacco abuse, naproxen will be the safest NSAID for analgesia.  - X-Ray lumbar spine, impression above. - Naproxen 500mg  BID x 2 weeks as needed for pain.    ATTENDING A&P: Agree with MS III Keith's note. If just MS pain, would exspect improvement over two weeks. Therefore, plain film to R/O fracture / misalignment. Xray R/O fracture but did show degenerative changes. Pt would be beneficial but Medicaid has severely limited PT coverage. Therefore, trial of Naproxen as [facility] states that it is the only NSAID that doesn't increase CV risk and she has known CAD. Already on Baclofen per neuro and Klonopin per mental health. No need to do MRI to assess for impinged nerve as no red flags, neurologic impairments, and time is best at this point. No need for hip films as able to walk and int rotation is OK. If no better, will need to be eval again.

## 2012-12-06 NOTE — Assessment & Plan Note (Signed)
Pt clarified that pysch is now Rxing her klonopin so I have indicated that on the RX.

## 2012-12-06 NOTE — Assessment & Plan Note (Signed)
I did not supply letter requested by the sig other and have relayed info the Dr Loistine Chance.

## 2012-12-06 NOTE — Progress Notes (Signed)
  Subjective:    [name], female    DOB: 1960/10/25, 52 y.o.   MRN: [id number]  HPI  Sandra [name] is a 52 yo pt of Dr Newt Lukes who is here for an acute visit with her sig other. She has two issues today.  First is constipation. She had been Rx'd Colace in the past which she states she took QD as directed for a while without any relief. She had had no BM for three weeks and took 4 Dulcolax that resulted in a BM. She has not had a BM since then. She has no blood in stool. She had a colon in Jan 2014 that revealed 2 sessile 3-5 mm polyps, one tubular, one hyperplastic. Constipation is the normal state for her.  The second is pain from a fall two weeks ago when she slipped on water on the kitchen floor. She fell onto her buttocks and her pain has not gotten better. Her elbows were scrapped at that time. She had no LOC, blurred vision, weakness. Her gait has been altered since then. No bowel / bladder abnl other than constipation. Sometimes the pain radiates into her R groin and down her legs. It is 8/10 and is stabbing. She has taken nothing to help. Laying down helps the pain. Being active makes it worse.   The sig other asks for a letter for a legal issue stating that he provides total care for Sandra [name] bc she is fully dependent in her ADLs. Review of the last 5 OV shows that nursing has documented that she is independent in her ADLS, med mgmt, and household chores.   Review of Systems  Constitutional: Positive for activity change. Negative for appetite change.  Cardiovascular: Negative for leg swelling.  Gastrointestinal: Positive for constipation. Negative for blood in stool.  Genitourinary:       No urinary incontinence  Musculoskeletal: Positive for back pain and gait problem.  Skin: Negative for color change and rash.  Neurological: Negative for light-headedness.  Hematological: Bruises/bleeds easily.       Objective:   Physical Exam  Constitutional: She is oriented to person,  place, and time. She appears well-developed and well-nourished. No distress.  HENT:  Head: Normocephalic and atraumatic.  Right Ear: External ear normal.  Left Ear: External ear normal.  Nose: Nose normal.  Eyes: Conjunctivae and EOM are normal.  Abdominal: Soft. Bowel sounds are decreased. There is generalized tenderness. There is no rebound and no guarding.  Musculoskeletal: She exhibits tenderness. She exhibits no edema.  She used her sig other's assistance to sit up and to get off table but walked couple steps to [facility] by herself. She sort of dropped into the [facility] rather than a slow lowering. She did not cry out when she did sit down. No sig pain with int / ext rotation of R hip. Tenderness to palp R lateral lower lumbar spine. No rash  Neurological: She is alert and oriented to person, place, and time.  Skin: Skin is warm and dry. No rash noted. She is not diaphoretic. No erythema. No pallor.  Psychiatric: She has a normal mood and affect. Her behavior is normal. Judgment and thought content normal.  Strength 5/5 foot extension and flexion. Great contraction of the R quads but didn't lift leg more than 5 inches for couple of sec.         Assessment & Plan:

## 2012-12-13 ENCOUNTER — Other Ambulatory Visit: Payer: Self-pay | Admitting: Internal Medicine

## 2013-01-14 ENCOUNTER — Other Ambulatory Visit: Payer: Self-pay | Admitting: Internal Medicine

## 2013-01-22 ENCOUNTER — Ambulatory Visit (INDEPENDENT_AMBULATORY_CARE_PROVIDER_SITE_OTHER): Payer: Medicaid Other | Admitting: Internal Medicine

## 2013-01-22 ENCOUNTER — Telehealth: Payer: Self-pay | Admitting: Neurology

## 2013-01-22 ENCOUNTER — Ambulatory Visit: Payer: Medicaid Other | Admitting: Internal Medicine

## 2013-01-22 ENCOUNTER — Encounter: Payer: Self-pay | Admitting: Internal Medicine

## 2013-01-22 VITALS — BP 157/82 | HR 56 | Temp 97.4°F | Ht 65.0 in | Wt 197.4 lb

## 2013-01-22 DIAGNOSIS — G4700 Insomnia, unspecified: Secondary | ICD-10-CM

## 2013-01-22 DIAGNOSIS — R7309 Other abnormal glucose: Secondary | ICD-10-CM

## 2013-01-22 DIAGNOSIS — E785 Hyperlipidemia, unspecified: Secondary | ICD-10-CM

## 2013-01-22 DIAGNOSIS — R739 Hyperglycemia, unspecified: Secondary | ICD-10-CM

## 2013-01-22 DIAGNOSIS — M549 Dorsalgia, unspecified: Secondary | ICD-10-CM

## 2013-01-22 DIAGNOSIS — F17200 Nicotine dependence, unspecified, uncomplicated: Secondary | ICD-10-CM

## 2013-01-22 DIAGNOSIS — K5900 Constipation, unspecified: Secondary | ICD-10-CM

## 2013-01-22 DIAGNOSIS — I10 Essential (primary) hypertension: Secondary | ICD-10-CM

## 2013-01-22 DIAGNOSIS — Z720 Tobacco use: Secondary | ICD-10-CM

## 2013-01-22 DIAGNOSIS — Z8673 Personal history of transient ischemic attack (TIA), and cerebral infarction without residual deficits: Secondary | ICD-10-CM

## 2013-01-22 LAB — POCT GLYCOSYLATED HEMOGLOBIN (HGB A1C): Hemoglobin A1C: 5.5

## 2013-01-22 MED ORDER — HYDROCHLOROTHIAZIDE 12.5 MG PO TABS: 12.5000 mg | ORAL_TABLET | Freq: Every day | ORAL | Status: DC

## 2013-01-22 MED ORDER — ESZOPICLONE 2 MG PO TABS: 2.0000 mg | ORAL_TABLET | Freq: Every day | ORAL | Status: DC

## 2013-01-22 MED ORDER — DOCUSATE SODIUM 100 MG PO CAPS: 100.0000 mg | ORAL_CAPSULE | Freq: Two times a day (BID) | ORAL | Status: DC

## 2013-01-22 MED ORDER — CHOLINE FENOFIBRATE 45 MG PO CPDR: 45.0000 mg | DELAYED_RELEASE_CAPSULE | Freq: Every day | ORAL | Status: DC

## 2013-01-22 MED ORDER — AMLODIPINE BESYLATE 5 MG PO TABS: 5.0000 mg | ORAL_TABLET | Freq: Every day | ORAL | Status: DC

## 2013-01-22 NOTE — Patient Instructions (Addendum)
General Instructions: -You need to quit smoking. Try to cut back on the amount you smoke. You may use sugar free gum to help you smoke less.  -Start taking amlodipine 5mg  daily for your blood pressure.  -Follow up with [place] in two weeks.    Treatment Goals:  Goals (1 Years of Data) as of 01/22/13         As of Today As of Today 11/15/12 11/01/12 11/01/12     Blood Pressure    . Blood Pressure < 130/80  157/82 165/82 122/81 195/90 187/90     Lifestyle    . Quit smoking / using tobacco    No No      Result Component    . LDL CALC < 70            Progress Toward Treatment Goals:  Treatment Goal 01/22/2013  Blood pressure deteriorated  Stop smoking smoking the same amount  Prevent falls unable to assess    Self Care Goals & Plans:  Self Care Goal 01/22/2013  Manage my medications take my medicines as prescribed; bring my medications to every visit; refill my medications on time; follow the sick day instructions if I am sick  Monitor my health keep track of my blood pressure; keep track of my weight  Eat healthy foods eat more vegetables; eat fruit for snacks and desserts; eat smaller portions  Be physically active take a walk every day; find an activity I enjoy  Stop smoking call [facility] (1-800-QUIT-NOW); set a quit date and stop smoking  Meeting treatment goals maintain the current self-care plan       Care Management & Community Referrals:  Referral 01/22/2013  Referrals made for care management support none needed

## 2013-01-23 NOTE — Assessment & Plan Note (Signed)
BP Readings from Last 3 Encounters:  01/22/13 157/82  11/15/12 122/81  11/01/12 195/90    Lab Results  Component Value Date   NA 142 11/15/2012   K 3.9 11/15/2012   CREATININE 0.70 11/15/2012    Assessment: Blood pressure control: mildly elevated Progress toward BP goal:  deteriorated Comments: She is on HCTZ 12.5mg  daily, Lopressor 50mg  daily, and Losartan 100mg  daily. It is not clear why amlodipine has not been filled in a while.   Plan: Medications:  continue current medications. Continue current medications and start amlodipine 5mg  daily (lower dose than before) as she had low BP with Azor.  Educational resources provided: Comptroller tools provided:   Other plans: Follow up in 2 weeks.

## 2013-01-23 NOTE — Assessment & Plan Note (Signed)
  Assessment: Progress toward smoking cessation:  smoking the same amount Barriers to progress toward smoking cessation:  other tobacco users at home Comments: Her significant other would like to quit as well. They want to start by cutting back in the amount of cigarettes smoked per day.   Plan: Instruction/counseling given:  I counseled patient on the dangers of tobacco use, advised patient to stop smoking, and reviewed strategies to maximize success. Educational resources provided:  [facility] [employer]) brochure Self management tools provided:    Medications to assist with smoking cessation:  pt would like to try sugar free chewing gum to help her reduce the amount of cigarrettes smoked. She has nictoine patches at home but has not used them. Patient agreed to the following self-care plans for smoking cessation: call [facility] (1-800-QUIT-NOW);set a quit date and stop smoking  Other plans: Follow up in 2 weeks.

## 2013-01-23 NOTE — Assessment & Plan Note (Signed)
She has had some straining with her bowel movements.  Continue Colace 100mg  BID.

## 2013-01-23 NOTE — Assessment & Plan Note (Signed)
No complaints during this visit, much improved.

## 2013-01-23 NOTE — Telephone Encounter (Signed)
I called pt and she will call at a later date re: appt for refill on baclofen.  I offered to make appt with Charlott Holler, NP.  She said she would call back later.

## 2013-01-23 NOTE — Progress Notes (Signed)
  Subjective:    [name], female    DOB: 12-03-1960, 52 y.o.   MRN: [id number]  HPI [name] is a 52 year old woman with PMH including HTN, anxiety, insomnia, and tobacco abuse who comes in for routine follow up visit. She requests refill for her Lunesta to help her sleep. Her blood pressure is elevated and she states that she has been smoking more than usual, up to one pack per day and has not tried nicotine patches yet. She has not taken amlodipine for weeks now. Her back pain is much improved.    Review of Systems  Constitutional: Negative for fever, chills, diaphoresis, activity change and appetite change.  Respiratory: Negative for cough and shortness of breath.   Cardiovascular: Negative for chest pain, palpitations and leg swelling.  Musculoskeletal: Positive for back pain.  Neurological: Negative for dizziness, syncope, weakness and light-headedness.  Hematological: Negative for adenopathy.  Psychiatric/Behavioral: Negative for behavioral problems and agitation.       Objective:   Physical Exam  Nursing note and vitals reviewed. Constitutional: She is oriented to person, place, and time. No distress.  Slow to answer questions, obese  HENT:  Head: Normocephalic and atraumatic.  Eyes: Conjunctivae are normal. No scleral icterus.  Cardiovascular: Normal rate and regular rhythm.   Pulmonary/Chest: Effort normal and breath sounds normal. No respiratory distress. She has no wheezes. She has no rales.  Musculoskeletal: She exhibits no edema and no tenderness.  Neurological: She is alert and oriented to person, place, and time.  Skin: Skin is warm and dry. No rash noted. She is not diaphoretic. No erythema. No pallor.  Psychiatric: Her behavior is normal.  Guarded affect          Assessment & Plan:

## 2013-01-23 NOTE — Assessment & Plan Note (Signed)
She has been off Baclofen for a while. This medication had been prescribed by her Neurologist for muscle spasms. She denies current muscle spasms. We will not refill this medications but she was advised to follow up with her Neurologist for possible refill of this medication.

## 2013-01-24 ENCOUNTER — Other Ambulatory Visit: Payer: Self-pay

## 2013-01-24 MED ORDER — BACLOFEN 10 MG PO TABS: 10.0000 mg | ORAL_TABLET | Freq: Three times a day (TID) | ORAL | Status: DC

## 2013-01-31 NOTE — Progress Notes (Signed)
Case discussed with Dr. Mckeever Ludwig at the time of the visit.  We reviewed the resident's history and exam and pertinent patient test results.  I agree with the assessment, diagnosis, and plan of care documented in the resident's note.

## 2013-02-05 ENCOUNTER — Encounter: Payer: Self-pay | Admitting: Internal Medicine

## 2013-02-05 ENCOUNTER — Ambulatory Visit (INDEPENDENT_AMBULATORY_CARE_PROVIDER_SITE_OTHER): Payer: Medicaid Other | Admitting: Internal Medicine

## 2013-02-05 VITALS — BP 133/81 | HR 66 | Temp 98.2°F | Wt 193.5 lb

## 2013-02-05 DIAGNOSIS — M47817 Spondylosis without myelopathy or radiculopathy, lumbosacral region: Secondary | ICD-10-CM

## 2013-02-05 DIAGNOSIS — F17200 Nicotine dependence, unspecified, uncomplicated: Secondary | ICD-10-CM

## 2013-02-05 DIAGNOSIS — I10 Essential (primary) hypertension: Secondary | ICD-10-CM

## 2013-02-05 DIAGNOSIS — Z720 Tobacco use: Secondary | ICD-10-CM

## 2013-02-05 DIAGNOSIS — IMO0002 Reserved for concepts with insufficient information to code with codable children: Secondary | ICD-10-CM

## 2013-02-05 DIAGNOSIS — N941 Unspecified dyspareunia: Secondary | ICD-10-CM

## 2013-02-05 NOTE — Patient Instructions (Addendum)
General Instructions: -Your blood pressure is good today. Continue taking amlodipine 5mg  daily.  -Take Tylenol or Ibuprofen for your back pain.  -Come back in 2-3 months for follow up visit.    Treatment Goals:  Goals (1 Years of Data) as of 02/05/13         As of Today 01/22/13 01/22/13 11/15/12 11/01/12     Blood Pressure    . Blood Pressure < 130/80  133/81 157/82 165/82 122/81 195/90     Lifestyle    . Quit smoking / using tobacco   No  No No     Result Component    . LDL CALC < 70            Progress Toward Treatment Goals:  Treatment Goal 02/05/2013  Blood pressure at goal  Stop smoking smoking the same amount  Prevent falls at goal    Self Care Goals & Plans:  Self Care Goal 02/05/2013  Manage my medications take my medicines as prescribed; refill my medications on time  Monitor my health bring my blood pressure log to each visit; keep track of my blood pressure  Eat healthy foods eat baked foods instead of fried foods; eat foods that are low in salt  Be physically active -  Stop smoking cut down the number of cigarettes smoked; go to the [employer] ([employer])  Meeting treatment goals -       Care Management & Community Referrals:  Referral 01/22/2013  Referrals made for care management support none needed

## 2013-02-06 ENCOUNTER — Encounter: Payer: Self-pay | Admitting: Internal Medicine

## 2013-02-06 DIAGNOSIS — N941 Unspecified dyspareunia: Secondary | ICD-10-CM | POA: Insufficient documentation

## 2013-02-06 NOTE — Assessment & Plan Note (Addendum)
She has pelvic floor muscle weakness due Neurologic deficit from her CVA, per her report and per Urology evaluation. Dyspareunia is not always present. She denies vaginal mass, uterine prolapse. She states that she had pelvic exam recently by Dr. Loistine Chance with no uterine prolapse noted.  Will consider pelvic exam during next visit.

## 2013-02-06 NOTE — Progress Notes (Signed)
  Subjective:    [name], female    DOB: [birth year], 52 y.o.   MRN: [id number]  HPI Sandra [name] is a 52 year old woman with PMH including CVA, HTN, and tobacco smoking who presents for follow up visit. She states that she was seen at [facility] for back pain with Xray evaluation of her spine showing that she has osteoarthritis of her lower back. She reports that the pain sometimes radiates to her right posterior leg. The pain is an ache, sometimes as high as a 7/10, she has not taken medications for this pain.  Since her stroke she has chronic bladder incontinence and occasional bowel incontinence. She was evaluated by Urology and was told that her bladder and uterus had "fallen" a little due to loss of enervation of her pelvic muscles from her CVA.  She complains of occasional dyspareunia but denies constant vaginal pain/mass, or sensation that her uterus is prolapsed.    Her Baclofen was refilled by Dr. Pearlean Brownie and she has appointment with him next month.     Review of Systems  Constitutional: Negative for fever, chills and activity change.  Respiratory: Negative for cough and shortness of breath.   Cardiovascular: Negative for chest pain.  Gastrointestinal: Negative for abdominal pain.  Genitourinary: Positive for dyspareunia. Negative for dysuria, vaginal bleeding, vaginal discharge, difficulty urinating and vaginal pain.       Chronic bladder incontinence   Musculoskeletal: Positive for back pain.  Neurological: Negative for numbness.  Psychiatric/Behavioral: Negative for agitation.       Objective:   Physical Exam  Nursing note and vitals reviewed. Constitutional: She is oriented to person, place, and time. She appears well-nourished. No distress.  Eyes: No scleral icterus.  Cardiovascular: Normal rate and regular rhythm.   Pulmonary/Chest: Effort normal and breath sounds normal. No respiratory distress. She has no wheezes. She has no rales.  Abdominal: Soft.   Musculoskeletal: She exhibits tenderness. She exhibits no edema.  Lumbar paraspinal tenderness   Neurological: She is alert and oriented to person, place, and time.  Skin: Skin is warm and dry. No rash noted. She is not diaphoretic. No erythema. No pallor.  Psychiatric: She has a normal mood and affect.          Assessment & Plan:

## 2013-02-06 NOTE — Assessment & Plan Note (Signed)
Per pt's report, she has lumbar and perhaps sacral osteoarthritis with Xrays done at [facility]. She has pain in the lumbar area that sometimes radiate to her posterior leg. No paresthesia or paralysis. She has chronic bowel/bladder incontinence due to her CVA. No indication for MRI eval at this time.  -Will obtain records from [facility] -Pt advised to take either Tylenol or Ibuprofen for her low back pain.  -Follow up in 2-3 months.

## 2013-02-06 NOTE — Assessment & Plan Note (Signed)
  Assessment: Progress toward smoking cessation:  smoking the same amount Barriers to progress toward smoking cessation:  none Comments: Pt wants to cut back with help from her husband ,who is a smoker, and eventually quit.   Plan: Instruction/counseling given:  I counseled patient on the dangers of tobacco use, advised patient to stop smoking, and reviewed strategies to maximize success. Educational resources provided:  Quitline[employer], [employer]ellery) brochure Self management tools provided:  smoking cessation plan (STAR Quit Plan) Medications to assist with smoking cessation:  None. Pt has nicotine patches at home.  Patient agreed to the following self-care plans for smoking cessation: cut down the number of cigarettes smoked;go to the QuitlineNC website (PumpkinSearch.com.ee)  Other plans: Follow up in 2-3 months.

## 2013-02-06 NOTE — Assessment & Plan Note (Signed)
BP Readings from Last 3 Encounters:  02/05/13 133/81  01/22/13 157/82  11/15/12 122/81    Lab Results  Component Value Date   NA 142 11/15/2012   K 3.9 11/15/2012   CREATININE 0.70 11/15/2012    Assessment: Blood pressure control: controlled Progress toward BP goal:  at goal Comments: Pt started on amlodipine 5mg  during her next visit. Continue HCTZ 12.5 mg, Cozaar 100mg  daily, Metoprolol 25mg  BID.   Plan: Medications:  continue current medications Educational resources provided: brochure Self management tools provided: home blood pressure logbook Other plans: Pt to see Dr. Pearlean Brownie next month, she may not need to be on Plavix and ASA (asa started by her Cardiologist)

## 2013-02-06 NOTE — Progress Notes (Signed)
Case discussed with Dr. Kennerly soon after the resident saw the patient.  We reviewed the resident's history and exam and pertinent patient test results.  I agree with the assessment, diagnosis, and plan of care documented in the resident's note. 

## 2013-02-07 ENCOUNTER — Ambulatory Visit: Payer: Self-pay | Admitting: Neurology

## 2013-02-16 ENCOUNTER — Other Ambulatory Visit: Payer: Self-pay | Admitting: Internal Medicine

## 2013-02-22 ENCOUNTER — Encounter: Payer: Self-pay | Admitting: Nurse Practitioner

## 2013-02-26 ENCOUNTER — Encounter: Payer: Self-pay | Admitting: Nurse Practitioner

## 2013-02-26 ENCOUNTER — Ambulatory Visit (INDEPENDENT_AMBULATORY_CARE_PROVIDER_SITE_OTHER): Payer: Medicaid Other | Admitting: Nurse Practitioner

## 2013-02-26 VITALS — BP 151/85 | HR 84 | Temp 98.4°F | Ht 66.0 in | Wt 201.0 lb

## 2013-02-26 DIAGNOSIS — I6350 Cerebral infarction due to unspecified occlusion or stenosis of unspecified cerebral artery: Secondary | ICD-10-CM

## 2013-02-26 DIAGNOSIS — I69959 Hemiplegia and hemiparesis following unspecified cerebrovascular disease affecting unspecified side: Secondary | ICD-10-CM

## 2013-02-26 DIAGNOSIS — Q2112 Patent foramen ovale: Secondary | ICD-10-CM

## 2013-02-26 DIAGNOSIS — F17200 Nicotine dependence, unspecified, uncomplicated: Secondary | ICD-10-CM

## 2013-02-26 DIAGNOSIS — Q2111 Secundum atrial septal defect: Secondary | ICD-10-CM

## 2013-02-26 DIAGNOSIS — Q211 Atrial septal defect: Secondary | ICD-10-CM

## 2013-02-26 MED ORDER — CLOPIDOGREL BISULFATE 75 MG PO TABS: ORAL_TABLET | ORAL | Status: DC

## 2013-02-26 MED ORDER — BACLOFEN 10 MG PO TABS: 10.0000 mg | ORAL_TABLET | Freq: Three times a day (TID) | ORAL | Status: DC

## 2013-02-26 NOTE — Patient Instructions (Signed)
Continue clopidogrel 75 mg orally every day  for secondary stroke prevention and maintain strict control of hypertension with blood pressure goal below 130/90, diabetes with hemoglobin A1c goal below 6.5% and lipids with LDL cholesterol goal below 100 mg/dL.   Stop daily aspirin.  Followup in the future with me in 1 year.

## 2013-02-26 NOTE — Progress Notes (Signed)
[facility]  PATIENT: Sandra [name] DOB: [birth year]   HISTORY FROM: patient, chart REASON FOR VISIT: routine follow up  HISTORY OF PRESENT ILLNESS:  PRIOR HPI : 52 year lady with right middle cerebral artery barnch infarct in August 2012 from right MCA occlusion with vascular risk factors of Diabetes, Hypertension, Hyperlipidimia, Smoking,CAD, Patent Foramen Ovale and mild Obesity.  Returns for followup after last visit on 07/21/11. She reports she is having an increase in dragging of her left leg notices this at different times.  Denies any falls.  Continues to have mild stiffness in left leg when she walks.  She does not cook or lift heavy objects.  She did not have a bubble study with doppler, Medicaid declined she states "because I had a heart attack and a stent".  No new neurologic complaints.  Reports her cholesterol and depression are stable.  She has had a 7 pound weight loss since her last visit.  She has completed physical and occupational therapy.   UPDATE 02/26/13 (LL):  Sandra [name] returns for stroke follow up.  She reports that she has done well, needs medication refills.  She reports that she had office visit with Dr. Einar Gip recently, total cholesterol was 140 and Triglycerides were 145.  Her fenofibrate was increased.  She reports weakness in left arm and leg with spasticity.  She states that Baclofen relieves her pain from spasticity.  She states that she is smoking 1 pack of cigarettes a day again.  She states she tried to quit but went back to it.  She states she still has patches and may try to quit again; she knows she should.  Review of Systems  Out of a complete 14 system review, the patient complains of only the following symptoms, and all other reviewed systems are negative.  Sleep: Insomnia   Snoring   Restless legs   Gastrointestinal: Incontinence, constipation   Psychiatric: Depression   Anxiety   Racing thoughts     Physical Exam  General: Pleasant  middle aged 52 lady, in no distress.  Afebrile.   Head: nontraumatic Ears, Nose and Throat: Hearing is normal.  Neck: supple without bruit Respiratory: clear to auscultation Cardiovascular: no murmur or gallop Musculoskeletal: no deformity Skin: no rash  ALLERGIES: Allergies  Allergen Reactions  . Azor [Amlodipine-Olmesartan] Other (See Comments)    Drops blood pressure too low  . Cozaar [Losartan Potassium]     HOME MEDICATIONS: Outpatient Prescriptions Prior to Visit  Medication Sig Dispense Refill  . allopurinol (ZYLOPRIM) 300 MG tablet TAKE 1 TABLET BY MOUTH EVERY DAY  30 tablet  0  . amLODipine (NORVASC) 5 MG tablet Take 1 tablet (5 mg total) by mouth daily.  30 tablet  11  . aspirin 81 MG tablet Take 1 tablet (81 mg total) by mouth daily.  30 tablet    . baclofen (LIORESAL) 10 MG tablet Take 1 tablet (10 mg total) by mouth 3 (three) times daily.  90 each  0  . Choline Fenofibrate 45 MG capsule Take 1 capsule (45 mg total) by mouth daily.  30 capsule  0  . clonazePAM (KLONOPIN) 0.5 MG tablet Take 1 tablet (0.5 mg total) by mouth 2 (two) times daily as needed for anxiety.  60 tablet  0  . clopidogrel (PLAVIX) 75 MG tablet TAKE 1 TABLET BY MOUTH ONCE DAILY  30 tablet  3  . docusate sodium (COLACE) 100 MG capsule Take 1 capsule (100 mg total) by mouth 2 (two)  times daily.  60 capsule  6  . eszopiclone (LUNESTA) 2 MG TABS tablet Take 1 tablet (2 mg total) by mouth at bedtime. Take immediately before bedtime  30 tablet  2  . hydrochlorothiazide (HYDRODIURIL) 12.5 MG tablet Take 1 tablet (12.5 mg total) by mouth daily.  30 tablet  11  . losartan (COZAAR) 100 MG tablet Take 1 tablet (100 mg total) by mouth daily.      . metoprolol (LOPRESSOR) 50 MG tablet Take 0.5 tablets (25 mg total) by mouth 2 (two) times daily.      . pravastatin (PRAVACHOL) 40 MG tablet Take 40 mg by mouth daily.      . traZODone (DESYREL) 100 MG tablet Take 100 mg by mouth 2 (two) times daily.      .  VESICARE 5 MG tablet TAKE 1 TABLET BY MOUTH EVERY DAY  30 tablet  3   No facility-administered medications prior to visit.    PAST MEDICAL HISTORY: Past Medical History  Diagnosis Date  . PFO (patent foramen ovale) August 2012    PFO seen on TEE during hospitalization in 01/2011.  Patient to f/u with Dr. Leonie Man, neurology, for enrollment in trial for medical treatment of PFO  . CAD (coronary artery disease) 2007    istory of MI with stent in 2007 by Dr. Chancy Milroy, [facility]. Stent placement by Dr Cleatis Polka   . Prediabetes 03/07/2011  . Hypertension 03/07/2011  . Hyperlipidemia 03/07/2011  . Tobacco abuse 03/07/2011  . Depression 03/07/2011  . Psoriasis 03/07/2011  . Acute right MCA stroke 01/27/11    Acute R MCA stroked  with L arm and leg weakness  . Anxiety 1980s    On Klonipin since age 57. Had addiction problem with Xanax which  Was therefore d/c . Seen in the past at [facility].   . Fractured toe 01/2011    History of left fifth toe proximal phalanx fracture when patient had a stroke (01/2011) and fell. Seen by Dr Doran Durand.   . Incontinence 04/14/2011  . Arthritis 04/16/2011  . Tobacco abuse 03/07/2011    Quit 01/2011   . Fracture of fifth toe, left, closed 01/28/2011    History of left fifth toe proximal phalanx fracture when patient had a stroke (01/2011) and fell. Seen by Dr Doran Durand.    . Substance abuse     h/o narcotic abuse pt denies as of 01/09/12  . Myocardial infarct, old 2008    stents    PAST SURGICAL HISTORY: Past Surgical History  Procedure Laterality Date  . Cardiac stent      2008    FAMILY HISTORY: Family History  Problem Relation Age of Onset  . Heart disease Mother   . Diabetes Mother   . Heart disease Father   . Diabetes Father   . Heart disease Brother   . Diabetes Brother   . Heart disease Maternal Grandmother   . Heart disease Maternal Grandfather   . Heart disease Paternal Grandmother   . Heart disease Paternal Grandfather   . Lung cancer  Brother   . Heart disease Brother     SOCIAL HISTORY: History   Social History  . Marital Status: Married    Spouse Name: N/A    Number of Children: 1  . Years of Education: N/A   Occupational History  . Disabled    Social History Main Topics  . Smoking status: Current Every Day Smoker -- 1.00 packs/day for 30 years    Types: Cigarettes  .  Smokeless tobacco: Never Used     Comment: 2 cigs/day  . Alcohol Use: No  . Drug Use: No  . Sexual Activity: Not on file   Other Topics Concern  . Not on file   Social History Narrative   Pt lives at home with sister and brother pt quit smoking 01/2011 pt denies drinking or using any illicit  drugs. Pt is a high school grad.     PHYSICAL EXAM  There were no vitals filed for this visit. There is no weight on file to calculate BMI.  Generalized: In no acute distress, pleasant Caucasian female   Neck: Supple, no carotid bruits   Cardiac: Regular rate rhythm, no murmur   Pulmonary: Clear to auscultation bilaterally   Musculoskeletal: No deformity   Neurologic Exam  Mental Status: Awake, alert and oriented to time, place and person.  Speech and language appear normal.   Cranial Nerves: Eye movements are full range without nystagmus.  Fundi revealed sharp disc margins without papilledema.  Visual fields are full to confrontational testing.  Face is asymmetric with mild left lower face weakness.  Tongue is midline. Hearing is normal. Motor: reveals no upper or lower extremity drift.  Symmetric and equal strength in all four extremities except mild left grip. Diminished fine finger movements on left and orbits right over left upper extremity.  Increased tone in the left knee extensors with mild spasticity. Sensory: Touch and pinprick sensations are normal.   Coordination: normal Gait and Station: steady gait with mild left foot drop and stiff and dragging left leg. Unable to do tandem walking without support. Toe walking with mild foot  drop Reflexes: Deep tendon reflexes are 2+ asymmetrc brsiker on left.  Plantars are downgoing.    DIAGNOSTIC DATA (LABS, IMAGING, TESTING) - I reviewed patient records, labs, notes, testing and imaging myself where available.  Lab Results  Component Value Date   WBC 6.2 01/27/2012   HGB 11.0* 01/27/2012   HCT 31.6* 01/27/2012   MCV 90.3 01/27/2012   PLT 119* 01/27/2012      Component Value Date/Time   NA 142 11/15/2012 0841   K 3.9 11/15/2012 0841   CL 104 11/15/2012 0841   CO2 29 11/15/2012 0841   GLUCOSE 112* 11/15/2012 0841   BUN 15 11/15/2012 0841   CREATININE 0.70 11/15/2012 0841   CREATININE 0.78 01/27/2012 0500   CALCIUM 9.4 11/15/2012 0841    Lab Results  Component Value Date   HGBA1C 5.5 01/22/2013    ASSESSMENT AND PLAN 52 year old female with right middle cerebral artery branch infarct in August 2012 from right MCA occlusion with vascular risk factors of Diabetes, Hypertension, Hyperlipidimia, Current Smoker,CAD, Patent Foramen Ovale, and mild Obesity.  Plan : Continue clopidogrel for stroke prevention with strict control of hypertension with blood pressure goal below 120/80, diabetes with hemoglobin A1c goal below 6.5%, lipids with LDL cholesterol  goal below 80 milligrams percent.  Advised her to exercise regularly, diet and continue weight loss. Continue baclofen for spasticity and pain.  Recommended strongly that she stop smoking. Refilled Baclofen and Clopidigrel. Followup in 12 months.  Tacari Repass NP-C 02/26/2013, 3:15 PM [facility] [address], [place], [place] [address] [phone]

## 2013-03-01 ENCOUNTER — Ambulatory Visit (INDEPENDENT_AMBULATORY_CARE_PROVIDER_SITE_OTHER): Payer: Medicaid Other | Admitting: *Deleted

## 2013-03-01 DIAGNOSIS — Z23 Encounter for immunization: Secondary | ICD-10-CM

## 2013-03-13 ENCOUNTER — Telehealth: Payer: Self-pay | Admitting: Licensed Clinical Social Worker

## 2013-03-13 NOTE — Telephone Encounter (Signed)
Mr. Rafferty left message with CSW regarding pt's need to increased PCS hours.  CSW returned call to Mr. Gallina.  Spouse states he is returning back to work and Mrs. Price is in need of additional PCS.  Spouse states pt can not be left alone due to h/o CVA, multiple falls.  Pt currently receives 80 hours of PCS through [facility].  Spouse is requesting an assessment for greater than 80 hours immediately.  CSW informed Mr. Siever, will forward to PCP however, [facility] will need to assess for add'l hours and this is unable to occur immediately.  Spouse voiced understanding and is aware it will take at least 1-2 weeks to hear from [facility], dependent on PCP's agreement for referral.  Mr. Hilbun is aware CSW is available to assist as needed.  Request for add'l hours initiated and placed in PCP's mailbox.  CSW will sign off.

## 2013-03-13 NOTE — Telephone Encounter (Signed)
I will not be able to be in the office until Friday for review of paperwork but I agree with this request. Perhaps the evaluation can be initiated with a verbal agreement?   Thanks,  SK

## 2013-03-14 NOTE — Telephone Encounter (Signed)
Friday will be fine, the form will require your signature. Thank you.

## 2013-03-14 NOTE — Telephone Encounter (Signed)
Spouse placed call to CSW this morning.  Indicating due to number of hours requested, [facility] will need to fax request to [facility].  Agency to [facility].  Spouse states he confirmed this with [facility].  CSW also received call from [facility] indicating same.  CSW informed Spouse and agency, physician plans to review forms on 03/15/13 and will request forms to be faxed to [facility]: Alda LeaDelice Bison, [phone]

## 2013-03-21 ENCOUNTER — Other Ambulatory Visit: Payer: Self-pay | Admitting: Internal Medicine

## 2013-03-24 ENCOUNTER — Other Ambulatory Visit: Payer: Self-pay

## 2013-03-24 MED ORDER — CLOPIDOGREL BISULFATE 75 MG PO TABS: ORAL_TABLET | ORAL | Status: DC

## 2013-03-24 MED ORDER — BACLOFEN 10 MG PO TABS: 10.0000 mg | ORAL_TABLET | Freq: Three times a day (TID) | ORAL | Status: DC

## 2013-03-27 ENCOUNTER — Other Ambulatory Visit: Payer: Self-pay | Admitting: *Deleted

## 2013-03-29 MED ORDER — SOLIFENACIN SUCCINATE 5 MG PO TABS: 5.0000 mg | ORAL_TABLET | Freq: Every day | ORAL | Status: DC

## 2013-04-03 ENCOUNTER — Telehealth: Payer: Self-pay | Admitting: *Deleted

## 2013-04-03 ENCOUNTER — Telehealth: Payer: Self-pay | Admitting: Licensed Clinical Social Worker

## 2013-04-03 NOTE — Telephone Encounter (Signed)
Pt is losing medicaid coverage 04/20/13 so requesting change from 1.  losartan to benicar   2. Also taking HCTZ 12.5 mg  So can combine Benicar/hctz, and 3. Change from trilipix to lipifen.

## 2013-04-03 NOTE — Telephone Encounter (Signed)
CSW received voice mail from Mr. Sandra [name].  Spouse states the previous worker at [facility] did not handle pt's request appropriately.  Spouse requesting CSW to fax PCS request to [facility], attn: Clarisse Gouge at [phone].  Form faxed as requested.  CSW left message notifying spouse.

## 2013-04-04 ENCOUNTER — Other Ambulatory Visit: Payer: Self-pay | Admitting: Internal Medicine

## 2013-04-04 ENCOUNTER — Other Ambulatory Visit: Payer: Self-pay | Admitting: *Deleted

## 2013-04-04 DIAGNOSIS — I10 Essential (primary) hypertension: Secondary | ICD-10-CM

## 2013-04-04 DIAGNOSIS — E781 Pure hyperglyceridemia: Secondary | ICD-10-CM

## 2013-04-04 MED ORDER — FENOFIBRATE 50 MG PO CAPS: 50.0000 mg | ORAL_CAPSULE | Freq: Every day | ORAL | Status: DC

## 2013-04-04 MED ORDER — OLMESARTAN MEDOXOMIL-HCTZ 40-12.5 MG PO TABS: 1.0000 | ORAL_TABLET | Freq: Every day | ORAL | Status: DC

## 2013-04-04 NOTE — Telephone Encounter (Signed)
Medications have been changed.  1. Losartan and HCTZ are now Benicar/HCTZ 2. Trilipix is now Lipofen.   I have them as called in to the Health dept.   Thanks! SK

## 2013-04-04 NOTE — Telephone Encounter (Signed)
Rx faxed in to [facility]

## 2013-04-11 ENCOUNTER — Telehealth: Payer: Self-pay | Admitting: *Deleted

## 2013-04-11 NOTE — Telephone Encounter (Signed)
Call from pt - requesting refill on Nicotine Patches but states 31m dose "didn't seem to help a lot".  thanks

## 2013-04-12 NOTE — Telephone Encounter (Signed)
Called pt - no one answered; left message stating 22m is max dose for Nicotine Patches and to try the Nicotine gum per Dr KHayes Ludwigand to call the clinic if she has any questions.

## 2013-04-12 NOTE — Telephone Encounter (Signed)
Please advise pt that the patch of 21mg  is the maximum which is the initial dose. Perhaps nicotine gum would be better for her? This is an over the counter product.

## 2013-04-16 ENCOUNTER — Other Ambulatory Visit: Payer: Self-pay | Admitting: Internal Medicine

## 2013-04-16 DIAGNOSIS — G4700 Insomnia, unspecified: Secondary | ICD-10-CM

## 2013-04-16 DIAGNOSIS — M109 Gout, unspecified: Secondary | ICD-10-CM

## 2013-04-17 ENCOUNTER — Other Ambulatory Visit: Payer: Self-pay | Admitting: *Deleted

## 2013-04-17 NOTE — Telephone Encounter (Signed)
Pt called about med refills.I informed her about Benicar, stated she does not have it. It was originally called to [facility]; pt uses [facility] - I called it to them.

## 2013-04-19 NOTE — Telephone Encounter (Signed)
Called to pharm 

## 2013-04-19 NOTE — Telephone Encounter (Signed)
Rx phoned in.   

## 2013-04-22 ENCOUNTER — Encounter: Payer: Medicaid Other | Admitting: Internal Medicine

## 2013-05-08 ENCOUNTER — Telehealth: Payer: Self-pay | Admitting: *Deleted

## 2013-05-08 NOTE — Telephone Encounter (Signed)
Message from [facility] stating pt taking Metoprolol tartrate 50 mg 1/2 twice a day.  This is no longer available from manufacture, can they change to Toprol XL 50 mg one tab daily?

## 2013-05-09 ENCOUNTER — Other Ambulatory Visit: Payer: Self-pay | Admitting: Internal Medicine

## 2013-05-09 DIAGNOSIS — M109 Gout, unspecified: Secondary | ICD-10-CM

## 2013-05-09 MED ORDER — METOPROLOL SUCCINATE ER 50 MG PO TB24: 50.0000 mg | ORAL_TABLET | Freq: Every day | ORAL | Status: DC

## 2013-05-09 MED ORDER — ALLOPURINOL 300 MG PO TABS: 300.0000 mg | ORAL_TABLET | Freq: Every day | ORAL | Status: DC

## 2013-05-09 NOTE — Telephone Encounter (Signed)
Faxed in

## 2013-05-09 NOTE — Telephone Encounter (Signed)
Hi Gayle,    Yes, she can do Toprol XL 50mg  daily.  I placed the order in Epic but could you call (or fax) it in? Thanks! SK

## 2013-05-20 ENCOUNTER — Encounter: Payer: Medicaid Other | Admitting: Internal Medicine

## 2013-05-27 ENCOUNTER — Encounter: Payer: Self-pay | Admitting: Internal Medicine

## 2013-05-27 ENCOUNTER — Ambulatory Visit (INDEPENDENT_AMBULATORY_CARE_PROVIDER_SITE_OTHER): Payer: No Typology Code available for payment source | Admitting: Internal Medicine

## 2013-05-27 VITALS — BP 160/96 | HR 113 | Temp 97.7°F | Wt 198.2 lb

## 2013-05-27 DIAGNOSIS — M109 Gout, unspecified: Secondary | ICD-10-CM

## 2013-05-27 DIAGNOSIS — I639 Cerebral infarction, unspecified: Secondary | ICD-10-CM

## 2013-05-27 DIAGNOSIS — E785 Hyperlipidemia, unspecified: Secondary | ICD-10-CM

## 2013-05-27 DIAGNOSIS — E7800 Pure hypercholesterolemia, unspecified: Secondary | ICD-10-CM

## 2013-05-27 DIAGNOSIS — I10 Essential (primary) hypertension: Secondary | ICD-10-CM

## 2013-05-27 DIAGNOSIS — Z8673 Personal history of transient ischemic attack (TIA), and cerebral infarction without residual deficits: Secondary | ICD-10-CM

## 2013-05-27 DIAGNOSIS — E781 Pure hyperglyceridemia: Secondary | ICD-10-CM

## 2013-05-27 DIAGNOSIS — I2510 Atherosclerotic heart disease of native coronary artery without angina pectoris: Secondary | ICD-10-CM

## 2013-05-27 MED ORDER — METOPROLOL TARTRATE 25 MG PO TABS: 25.0000 mg | ORAL_TABLET | Freq: Two times a day (BID) | ORAL | Status: DC

## 2013-05-27 MED ORDER — CLOPIDOGREL BISULFATE 75 MG PO TABS: ORAL_TABLET | ORAL | Status: DC

## 2013-05-27 MED ORDER — LOVASTATIN 40 MG PO TABS: 40.0000 mg | ORAL_TABLET | Freq: Every day | ORAL | Status: DC

## 2013-05-27 NOTE — Patient Instructions (Signed)
General Instructions: -Start taking Plavix today.  -Call us if you have problems with the metoprolol or the Mevacor prescription.  -Follow up with [place] in 1-2 months or sooner. Call us once you get the [facility].     Treatment Goals:  Goals (1 Years of Data) as of 05/27/13         As of Today 02/26/13 02/05/13 01/22/13 01/22/13     Blood Pressure    . Blood Pressure < 130/80  160/96 151/85 133/81 157/82 165/82     Lifestyle    . Quit smoking / using tobacco    No No      Result Component    . LDL CALC < 70            Progress Toward Treatment Goals:  Treatment Goal 05/27/2013  Blood pressure deteriorated  Stop smoking smoking less  Prevent falls -    Self Care Goals & Plans:  Self Care Goal 05/27/2013  Manage my medications take my medicines as prescribed; bring my medications to every visit  Monitor my health -  Eat healthy foods eat foods that are low in salt; eat baked foods instead of fried foods  Be physically active -  Stop smoking go to the [employer]uitlineNC website (PumpkinSearch.com.ee); cut down the number of cigarettes smoked  Meeting treatment goals -    No flowsheet data found.   Care Management & Community Referrals:  Referral 05/27/2013  Referrals made for care management support none needed  Referrals made to community resources none

## 2013-05-28 ENCOUNTER — Telehealth: Payer: Self-pay | Admitting: *Deleted

## 2013-05-28 NOTE — Assessment & Plan Note (Signed)
She requires a high dose statin which is not available at the [facility] $4 list.  Will fax Rx to the Health Dept.

## 2013-05-28 NOTE — Progress Notes (Signed)
   Subjective:    [name], female    DOB: August 04, 1960, 52 y.o.   MRN: [id number]  HPI Ms. Heiden is a pleasant 52 year old woman with PMH significant for HTN, CAD, hx of CVA, who presents for medication management. She recently lost her Medicaid coverage and now is applying for the [facility] Card and MAP and needs less costly medications. She brings a list of generic medications that are available at low cost or are free at the Health Department. She has run out of most of her medications, including Plavix.    Review of Systems  Constitutional: Negative for fever, chills, diaphoresis, activity change, appetite change and fatigue.  Respiratory: Negative for cough and shortness of breath.   Cardiovascular: Negative for chest pain, palpitations and leg swelling.  Gastrointestinal: Negative for nausea, vomiting, abdominal pain and diarrhea.  Genitourinary: Negative for dysuria.  Neurological: Negative for dizziness, weakness, light-headedness and headaches.  Hematological: Negative for adenopathy. Does not bruise/bleed easily.  Psychiatric/Behavioral: Negative for behavioral problems, confusion and agitation.       Objective:   Physical Exam  Nursing note and vitals reviewed. Constitutional: She is oriented to person, place, and time. She appears well-nourished. No distress.  Cardiovascular: Regular rhythm.   tachycardia  Pulmonary/Chest: Effort normal. No respiratory distress. She has no wheezes. She has no rales.  Abdominal: Soft. There is no tenderness.  Musculoskeletal: She exhibits no tenderness.  Trace pitting edema bilaterally up to her knees.   Neurological: She is alert and oriented to person, place, and time.  Skin: Skin is warm and dry. No rash noted. She is not diaphoretic. No erythema. No pallor.  Psychiatric: She has a normal mood and affect. Her behavior is normal.          Assessment & Plan:

## 2013-05-28 NOTE — Telephone Encounter (Signed)
[facility] does not have Lovastatin.  Thanks

## 2013-05-28 NOTE — Telephone Encounter (Signed)
Hi Glenda,    Could they wait to get the higher dose of pravastatin instead? It will be $6 at the Health Department. Thanks!

## 2013-05-28 NOTE — Assessment & Plan Note (Signed)
Given her history of CVA, advised her to start taking Plavix immediately after this visit.  Plavix 75mg  #30 is $30 at [facility] and she and her husband state that they could afford this medication. This Rx was printed and given to her during this visit.  Rx also faxed to the Health Dept as this will cost $6 per month but will not be available until 12/10 PM.

## 2013-05-28 NOTE — Telephone Encounter (Signed)
Call from pt's husband - states pt can get Lovastatin 20mg  for $4.00. Th40mg  tabs costs $25.00. Requesting rx changed to 20mg  BID. Thanks

## 2013-05-29 MED ORDER — PRAVASTATIN SODIUM 40 MG PO TABS: 40.0000 mg | ORAL_TABLET | Freq: Every day | ORAL | Status: DC

## 2013-05-29 MED ORDER — CLOPIDOGREL BISULFATE 75 MG PO TABS: ORAL_TABLET | ORAL | Status: DC

## 2013-05-29 MED ORDER — AMLODIPINE BESYLATE 5 MG PO TABS: 10.0000 mg | ORAL_TABLET | Freq: Every day | ORAL | Status: DC

## 2013-05-29 MED ORDER — FENOFIBRATE 50 MG PO CAPS: 50.0000 mg | ORAL_CAPSULE | Freq: Every day | ORAL | Status: DC

## 2013-05-29 MED ORDER — ALLOPURINOL 300 MG PO TABS: 300.0000 mg | ORAL_TABLET | Freq: Every day | ORAL | Status: DC

## 2013-05-29 MED ORDER — OLMESARTAN MEDOXOMIL-HCTZ 40-12.5 MG PO TABS: 1.0000 | ORAL_TABLET | Freq: Every day | ORAL | Status: DC

## 2013-05-29 MED ORDER — METOPROLOL TARTRATE 25 MG PO TABS: 25.0000 mg | ORAL_TABLET | Freq: Two times a day (BID) | ORAL | Status: DC

## 2013-05-29 NOTE — Assessment & Plan Note (Addendum)
Dr. Jacinto Halim does not accept the [facility] and she needs referral to establish care with [facility].  Referred to outpatient Cardiology. She is aware that she will need to sign a release of information form to have Dr. Verl Dicker note forwarded to her new Cardiologist.   She has history of MI with stent in 2008. [facility] Cath/stent 10/16/06 mid circumflex 3x18 mm Vsion non DES  by Dr. Park Breed, [facility]. Stent placement by Dr Karoline Caldwell   Stress test January 2013 by Dr Jacinto Halim: Within normal limits per patient.

## 2013-05-29 NOTE — Assessment & Plan Note (Signed)
BP Readings from Last 3 Encounters:  05/27/13 160/96  02/26/13 151/85  10/31/11 110/70    Lab Results  Component Value Date   NA 142 11/15/2012   K 3.9 11/15/2012   CREATININE 0.70 11/15/2012    Assessment: Blood pressure control: moderately elevated Progress toward BP goal:  deteriorated Comments: BP elevated as she has been out of her meds for at least 2 days.   Plan: Medications:  continue current medications but change them per Health Dept formulary options due to cost.  Educational resources provided: brochure Self management tools provided: home blood pressure logbook Other plans: Follow up in 1-2 weeks for BP recheck.

## 2013-05-29 NOTE — Progress Notes (Signed)
Case discussed with Dr. Pallone Ludwig at time of visit.  We reviewed the resident's history and exam and pertinent patient test results.  I agree with the assessment, diagnosis, and plan of care documented in the resident's note.

## 2013-05-31 NOTE — Telephone Encounter (Signed)
Called pt - pt did filled Pravastatin rx.

## 2013-07-02 ENCOUNTER — Other Ambulatory Visit: Payer: Self-pay | Admitting: Internal Medicine

## 2013-07-02 DIAGNOSIS — Z1231 Encounter for screening mammogram for malignant neoplasm of breast: Secondary | ICD-10-CM

## 2013-07-03 ENCOUNTER — Telehealth: Payer: Self-pay | Admitting: Licensed Clinical Social Worker

## 2013-07-03 NOTE — Telephone Encounter (Signed)
Sandra [name] placed call to [place] and left voice mail request for Sandra [name] to complete a new [facility] form requesting add'l hours over 80.  [facility] had completed this form in 02/2013 and faxed to the specific person Sandra [name] requested.  Spouse continues to state the agency has "crooked people" that mishandled the paperwork.  This time Sandra [name] requesting paperwork to be faxed to Sandra [name] at [phone].  Once form is completed, CSW will fax to both [place] per protocol and Sandra [name] per pt family request.  Sandra [name] states he is still unable to work due to having to provide Sandra [name] with care.  Sandra [name] states new agency is [employer] out of [place].

## 2013-07-17 ENCOUNTER — Institutional Professional Consult (permissible substitution): Payer: No Typology Code available for payment source | Admitting: Cardiology

## 2013-07-25 ENCOUNTER — Telehealth: Payer: Self-pay | Admitting: *Deleted

## 2013-07-25 NOTE — Telephone Encounter (Signed)
No meds that would cause D. Last ABX was summer 2014.   Agree with bland diet and making appt if no better couple of days.  Sooner if fever, can't eat / drink, blood in stools, dizzy, CP, other sxs.

## 2013-07-25 NOTE — Telephone Encounter (Signed)
Pt called with c/o diarrhea, returned call within 10 minutes and no answer.  Left message to return my call.

## 2013-07-25 NOTE — Telephone Encounter (Signed)
Pt called back with c/o diarrhea for 2 days.  She is having loose stools but only about 2 BM's a day. Pt had a hard time reaching the commode. She denies dizziness or any other complaints. No new meds but she has eaten a lot of cheese today.  Pt advised to change to a bland diet for a few days, drink plenty  of liquids, and call back if the loose stools do not improve in a few days.  Also to call back for any other changes. Pt # [id number]  Please advise

## 2013-07-25 NOTE — Telephone Encounter (Signed)
Pt agrees to call for changes.

## 2013-08-02 ENCOUNTER — Telehealth: Payer: Self-pay | Admitting: Neurology

## 2013-08-02 NOTE — Telephone Encounter (Signed)
Pt's husband called, upset that no one has called his wife back yet.  He states that she is suffering and in a lot of pain in her legs and he is taking her to the hospital. If any one calls back to leave a message.

## 2013-08-02 NOTE — Telephone Encounter (Signed)
This should have been sent to the work in doctor when husband first called, since Dr. Leonie Man is not in the office today.

## 2013-08-02 NOTE — Telephone Encounter (Signed)
Spoke with patient and she said that she is having left leg pain, keeping her up until 2 am. Could physician prescribe  her something in for the pain? Baclofen not working

## 2013-08-02 NOTE — Telephone Encounter (Signed)
Pt called in and stated that the Baclofen is not working.  She is still having a lot of leg pain and it is waking her up at night.  She said that it woke her up last night at 1:00 am and she was crying she was in such pain.  She would like to know if there is anything Dr. Leonie Man can do.  Please call.  Thank you.

## 2013-08-05 NOTE — Telephone Encounter (Addendum)
Spoke with patient and she did go to ER, had a CT scan and no new strokes, seemed to think she may have fibromyalgia, was given a total work up,given tramadol for pain and is doing much better,was told to f/u with pcp, and that she is out of baclofen

## 2013-08-05 NOTE — Telephone Encounter (Signed)
A one year supply of Baclofen was sent to the pharmacy in October.  I called the pharmacy.  Spoke with Linna Hoff.  He said they did have the Rx on file, but the patient transferred in to a different pharmacy in Dec.  I am unsure what pharmacy the Rx was transferred to.  I called the patient back.  Got no answer.  Left message.  Asked that they contact the pharmacy the Rx was transferred to and ask for a refill.  Said if anything further is needed, please call our office.

## 2013-08-06 ENCOUNTER — Telehealth: Payer: Self-pay | Admitting: Internal Medicine

## 2013-08-06 NOTE — Telephone Encounter (Signed)
  INTERNAL MEDICINE RESIDENCY PROGRAM After-Hours Telephone Call    Reason for call:   I received a call from [name] at 7:48 PM, 08/06/2013 indicating leg pain.    Pertinent Data:   The patient states she was seen at [facility] 4 days ago for leg pain, and a CT scan of her leg at that time was negative.  She was given 12 tramadol, but has used all of these, and still has pain.  She notes that she has some Naproxen at home, but wonders if it's ok to take.    Assessment / Plan / Recommendations:   The patient has no history of PUD or CKD, but does have a history of CAD.  I advised that she could take 1 Naproxen today, but should avoid chronic use.  If the patient continues to have pain, she is advised to call our clinic during business hours to schedule a follow-up appointment.  As always, pt is advised that if symptoms worsen or new symptoms arise, they should go to an urgent care facility or to to ER for further evaluation.    Hester Mates, MD   08/06/2013, 7:48 PM

## 2013-08-12 ENCOUNTER — Encounter: Payer: No Typology Code available for payment source | Admitting: Internal Medicine

## 2013-08-12 ENCOUNTER — Encounter: Payer: Self-pay | Admitting: *Deleted

## 2013-08-12 ENCOUNTER — Encounter: Payer: Self-pay | Admitting: Internal Medicine

## 2013-08-12 ENCOUNTER — Ambulatory Visit (INDEPENDENT_AMBULATORY_CARE_PROVIDER_SITE_OTHER): Payer: No Typology Code available for payment source | Admitting: Internal Medicine

## 2013-08-12 VITALS — BP 142/87 | HR 83 | Temp 98.2°F | Wt 214.6 lb

## 2013-08-12 DIAGNOSIS — E7800 Pure hypercholesterolemia, unspecified: Secondary | ICD-10-CM

## 2013-08-12 DIAGNOSIS — G4700 Insomnia, unspecified: Secondary | ICD-10-CM

## 2013-08-12 DIAGNOSIS — E785 Hyperlipidemia, unspecified: Secondary | ICD-10-CM

## 2013-08-12 DIAGNOSIS — E8881 Metabolic syndrome: Secondary | ICD-10-CM

## 2013-08-12 DIAGNOSIS — F17200 Nicotine dependence, unspecified, uncomplicated: Secondary | ICD-10-CM

## 2013-08-12 DIAGNOSIS — Z0000 Encounter for general adult medical examination without abnormal findings: Secondary | ICD-10-CM

## 2013-08-12 DIAGNOSIS — Z720 Tobacco use: Secondary | ICD-10-CM

## 2013-08-12 DIAGNOSIS — I10 Essential (primary) hypertension: Secondary | ICD-10-CM

## 2013-08-12 LAB — LIPID PANEL
Cholesterol: 147 mg/dL (ref 0–200)
HDL: 30 mg/dL — AB (ref >39)
LDL Cholesterol: 65 mg/dL (ref 0–99)
Total CHOL/HDL Ratio: 4.9 Ratio
Triglycerides: 259 mg/dL — ABNORMAL HIGH (ref <150)
VLDL: 52 mg/dL — AB (ref 0–40)

## 2013-08-12 LAB — POCT GLYCOSYLATED HEMOGLOBIN (HGB A1C): Hemoglobin A1C: 5.8

## 2013-08-12 LAB — GLUCOSE, CAPILLARY: Glucose-Capillary: 86 mg/dL (ref 70–99)

## 2013-08-12 NOTE — Patient Instructions (Signed)
General Instructions: -Please schedule an appointment to see Debera Lat for weight loss plan and nutrition counseling as soon as possible.  -Continue working on quitting smoking. You may call 1-800-QUIT-NOW for additional help.  -Follow up with me in 3 months, or sooner for a Pap smear appointment.    Treatment Goals:  Goals (1 Years of Data) as of 08/12/13         As of Today 05/27/13 02/26/13 02/05/13 01/22/13     Blood Pressure    . Blood Pressure < 130/80  142/87 160/96 151/85 133/81 157/82     Lifestyle    . Quit smoking / using tobacco     No No     Result Component    . LDL CALC < 70            Progress Toward Treatment Goals:  Treatment Goal 08/12/2013  Blood pressure at goal  Stop smoking smoking the same amount  Prevent falls -    Self Care Goals & Plans:  Self Care Goal 08/12/2013  Manage my medications bring my medications to every visit  Monitor my health -  Eat healthy foods eat fruit for snacks and desserts  Be physically active -  Stop smoking go to the [employer] (https://scott-booker.info/); call [facility] (1-800-QUIT-NOW)  Meeting treatment goals maintain the current self-care plan    No flowsheet data found.   Care Management & Community Referrals:  Referral 08/12/2013  Referrals made for care management support health educator; nutritionist  Referrals made to community resources weight management

## 2013-08-13 ENCOUNTER — Encounter: Payer: Self-pay | Admitting: Cardiology

## 2013-08-13 DIAGNOSIS — Z0000 Encounter for general adult medical examination without abnormal findings: Secondary | ICD-10-CM | POA: Insufficient documentation

## 2013-08-13 DIAGNOSIS — E8881 Metabolic syndrome: Secondary | ICD-10-CM | POA: Insufficient documentation

## 2013-08-13 NOTE — Assessment & Plan Note (Signed)
She states that she takes Klonopin on occasion to help her sleep. She has tried Ambien in the past with not much improvement of her insomnia. She declines Rx for Ambien at this time and wants to continue using Klonopin PRN for now. Of note, Klonopin is being prescribed by her psychiatrist.

## 2013-08-13 NOTE — Assessment & Plan Note (Signed)
BP Readings from Last 3 Encounters:  08/12/13 142/87  05/27/13 160/96  02/26/13 151/85    Lab Results  Component Value Date   NA 142 11/15/2012   K 3.9 11/15/2012   CREATININE 0.70 11/15/2012    Assessment: Blood pressure control: controlled Progress toward BP goal:  at goal Comments: She is on Norvasc 10mg  daily (5mg  x2 due to lower cost from her pharmacy), metoprolol 25mg  BID, Benicar 40-12.5mg  daily. Her blood pressure was slightly elevated initially but improved with recheck.   Plan: Medications:  continue current medications Educational resources provided:   Self management tools provided:   Other plans: Continue monitoring. Patient also counseled to avoid sodas and sweets to aid in weight loss and may also help lower her BP.

## 2013-08-13 NOTE — Assessment & Plan Note (Signed)
Recheck lipid panel during this visit, LDL of 65. Patient encouraged to improve her diet given her ~20lb weight gain. She will cut back on sweets.

## 2013-08-13 NOTE — Progress Notes (Signed)
   Subjective:    [name], female    DOB: August 26, 1960, 53 y.o.   MRN: [id number]  Leg Pain    Sandra [name] is a 53 year old woman with PMH significant for HTN, CAD, hx of CVA, who presents for routine follow up visit. She states that she had leg pain last week and had to go to [facility] ER with CT head negative for a stroke but was told that she may have fibromyalgia. Her pain improved with tramadol and Baclofen which was restarted by her Neurologist.  She notes that she has been eating more recently and has gained weight.  She is able to afford all her medications except Lunesta which costs more than $300 out of pocket. She has been taking Klonopin occasionally to help her sleep with success most of the times. She was on Ambien in the past but this medicine did not work as well for her as she would wake up in the middle of the night.   Review of Systems  Constitutional: Negative for fever, chills, diaphoresis, activity change, appetite change, fatigue and unexpected weight change.  HENT: Negative for congestion.   Respiratory: Negative for cough, shortness of breath and wheezing.   Cardiovascular: Negative for chest pain and leg swelling.  Gastrointestinal: Negative for abdominal pain.  Genitourinary: Negative for dysuria.  Musculoskeletal: Negative for back pain.  Neurological: Negative for dizziness, light-headedness and headaches.  Psychiatric/Behavioral: Positive for sleep disturbance.       Objective:   Physical Exam  Nursing note and vitals reviewed. Constitutional: She is oriented to person, place, and time. She appears well-nourished. No distress.  Eyes: Conjunctivae are normal. No scleral icterus.  Cardiovascular: Normal rate and regular rhythm.   Pulmonary/Chest: Effort normal and breath sounds normal. No respiratory distress. She has no wheezes. She has no rales.  Abdominal: Soft. There is no tenderness.  Musculoskeletal: She exhibits no edema and no  tenderness.  Neurological: She is alert and oriented to person, place, and time.  Skin: Skin is warm and dry. She is not diaphoretic. No erythema.  Psychiatric: She has a normal mood and affect.          Assessment & Plan:

## 2013-08-13 NOTE — Assessment & Plan Note (Signed)
She has mammogram scheduled for next month.  She will make an appointment for Pap smear. She denies vaginal discharge/bleeding.

## 2013-08-13 NOTE — Assessment & Plan Note (Signed)
She has gained ~20lbs since her last visit. She reports eating a poor diet with many sweets. She is interested in meeting with Debera Lat for nutrition and weight loss counseling.  Hg A1C 5.8%, not diabetic.

## 2013-08-13 NOTE — Assessment & Plan Note (Addendum)
  Assessment: Progress toward smoking cessation:  smoking the same amount Barriers to progress toward smoking cessation:  other tobacco users at home Comments: She has quit last year around Thanksgiving but relapsed as her husband continued to smoke. She now smokes one pack of cigarettes per day.   Plan: Instruction/counseling given:  I counseled patient on the dangers of tobacco use, advised patient to stop smoking, and reviewed strategies to maximize success. Educational resources provided:    Self management tools provided:    Medications to assist with smoking cessation:  None Patient agreed to the following self-care plans for smoking cessation: go to the [employer] (www.quitlinenc.com);call [facility] (1-800-QUIT-NOW)  Other plans: She and her husband want to continue cutting back on smoking. She has nicotine patches at home but states that she is not ready to use them as of yet as she wants more time to be mentally ready to quit. Will continue smoking cessation counseling during future visits.

## 2013-08-14 ENCOUNTER — Institutional Professional Consult (permissible substitution): Payer: No Typology Code available for payment source | Admitting: Cardiology

## 2013-08-14 ENCOUNTER — Other Ambulatory Visit: Payer: Self-pay | Admitting: *Deleted

## 2013-08-14 DIAGNOSIS — I639 Cerebral infarction, unspecified: Secondary | ICD-10-CM

## 2013-08-14 NOTE — Progress Notes (Signed)
INTERNAL MEDICINE TEACHING ATTENDING ADDENDUM - Dominic Pea, DO: I reviewed and discussed at the time of visit with the resident Dr. Sindoni Ludwig, the patient's medical history, physical examination, diagnosis and results of tests and treatment and I agree with the patient's care as documented.

## 2013-08-17 MED ORDER — CLOPIDOGREL BISULFATE 75 MG PO TABS: ORAL_TABLET | ORAL | Status: DC

## 2013-08-21 ENCOUNTER — Ambulatory Visit (HOSPITAL_COMMUNITY)
Admission: RE | Admit: 2013-08-21 | Discharge: 2013-08-21 | Disposition: A | Discharge status: Home / Self Care | Payer: No Typology Code available for payment source | Source: Ambulatory Visit | Attending: Internal Medicine | Admitting: Internal Medicine

## 2013-08-21 ENCOUNTER — Ambulatory Visit (HOSPITAL_COMMUNITY): Admission: RE | Admit: 2013-08-21 | Payer: No Typology Code available for payment source | Source: Ambulatory Visit

## 2013-08-21 DIAGNOSIS — Z1231 Encounter for screening mammogram for malignant neoplasm of breast: Secondary | ICD-10-CM | POA: Insufficient documentation

## 2013-08-27 ENCOUNTER — Telehealth: Payer: Self-pay | Admitting: *Deleted
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
## 2013-08-28 ENCOUNTER — Ambulatory Visit: Payer: No Typology Code available for payment source | Admitting: Family Medicine

## 2013-09-10 ENCOUNTER — Telehealth: Payer: Self-pay | Admitting: *Deleted

## 2013-09-10 NOTE — Telephone Encounter (Signed)
Call made to pt regarding "standing medication order" form received from [place] ([facility]a mail order pharmacy.  Pt does not currently have medicaid, but is working on having it reinstated.  She was informed that she will have to pay out of pocket costs for meds should she chose to have them filled by [facility]. She was also informed that her PCP here in [facility] did not prescribe all the medications on the list and she informed me that the clonazepam and baclofen were prescribed by another physician, but she requests MD to go ahead an sign off on other meds.  Will place form back in PCPs box for completion/signature.Regenia Skeeter, Shailynn Fong Cassady3/24/20159:09 AM

## 2013-09-16 ENCOUNTER — Other Ambulatory Visit: Payer: Self-pay | Admitting: Internal Medicine

## 2013-09-16 DIAGNOSIS — S6290XA Unspecified fracture of unspecified wrist and hand, initial encounter for closed fracture: Secondary | ICD-10-CM

## 2013-10-02 ENCOUNTER — Institutional Professional Consult (permissible substitution): Payer: No Typology Code available for payment source | Admitting: Cardiology

## 2013-10-04 ENCOUNTER — Telehealth: Payer: Self-pay | Admitting: Neurology

## 2013-10-04 NOTE — Telephone Encounter (Signed)
Information was given to [place], RN, Dr. Clydene Fake nurse.

## 2013-10-04 NOTE — Telephone Encounter (Signed)
Information was given to Dr. Clydene Fake nurse, Lovey Newcomer, [place].

## 2013-10-04 NOTE — Telephone Encounter (Signed)
See previous message
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
## 2013-10-04 NOTE — Telephone Encounter (Signed)
Patient returning call to [facility], please call patient and advise.

## 2013-10-04 NOTE — Telephone Encounter (Signed)
I called and LMVM for husband to return call about what it going on.

## 2013-10-04 NOTE — Telephone Encounter (Signed)
Sandra [name], Mr Jessie returning your call.. Thanks
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
## 2013-10-08 ENCOUNTER — Telehealth: Payer: Self-pay | Admitting: *Deleted

## 2013-10-08 NOTE — Telephone Encounter (Signed)
Pt called asking if Dr Cade Ludwig will send her some information on Plavix.  She had a fall and has some bruising and wants more information on it's effects.

## 2013-10-08 NOTE — Telephone Encounter (Signed)
I talked with Dr Hackley Ludwig and she prefers that pt get this information from the doctor that prescribes the medication. ( Plavix)   Pt informed.

## 2013-11-04 ENCOUNTER — Ambulatory Visit (INDEPENDENT_AMBULATORY_CARE_PROVIDER_SITE_OTHER): Payer: No Typology Code available for payment source | Admitting: Internal Medicine

## 2013-11-04 ENCOUNTER — Encounter: Payer: Medicaid Other | Admitting: Dietician

## 2013-11-04 ENCOUNTER — Encounter: Payer: Self-pay | Admitting: Internal Medicine

## 2013-11-04 VITALS — BP 140/87 | HR 58 | Temp 97.4°F | Wt 191.3 lb

## 2013-11-04 DIAGNOSIS — F17200 Nicotine dependence, unspecified, uncomplicated: Secondary | ICD-10-CM

## 2013-11-04 DIAGNOSIS — Z720 Tobacco use: Secondary | ICD-10-CM

## 2013-11-04 DIAGNOSIS — A4902 Methicillin resistant Staphylococcus aureus infection, unspecified site: Secondary | ICD-10-CM

## 2013-11-04 DIAGNOSIS — I10 Essential (primary) hypertension: Secondary | ICD-10-CM

## 2013-11-04 DIAGNOSIS — H44003 Unspecified purulent endophthalmitis, bilateral: Secondary | ICD-10-CM

## 2013-11-04 DIAGNOSIS — R7881 Bacteremia: Secondary | ICD-10-CM

## 2013-11-04 DIAGNOSIS — H44009 Unspecified purulent endophthalmitis, unspecified eye: Secondary | ICD-10-CM

## 2013-11-04 DIAGNOSIS — Z8701 Personal history of pneumonia (recurrent): Secondary | ICD-10-CM

## 2013-11-04 LAB — BASIC METABOLIC PANEL WITH GFR
BUN: 13 mg/dL (ref 6–23)
CHLORIDE: 108 meq/L (ref 96–112)
CO2: 25 meq/L (ref 19–32)
CREATININE: 1.18 mg/dL — AB (ref 0.50–1.10)
Calcium: 9 mg/dL (ref 8.4–10.5)
GFR, Est African American: 61 mL/min
GFR, Est Non African American: 53 mL/min — ABNORMAL LOW
GLUCOSE: 81 mg/dL (ref 70–99)
Potassium: 3.5 mEq/L (ref 3.5–5.3)
Sodium: 143 mEq/L (ref 135–145)

## 2013-11-04 MED ORDER — AMLODIPINE BESYLATE 5 MG PO TABS: 5.0000 mg | ORAL_TABLET | Freq: Every day | ORAL | Status: DC

## 2013-11-04 NOTE — Patient Instructions (Addendum)
General Instructions: -Congratulations in stopping smoking! This is a very important decision for your health! -You have lost 13 lbs. Now that your appetite is improving, continue eating healthier meals and healthier snacks. This will help improve your blood pressure and overall health.  -Start taking your medications as prescribed, you may use the list of medications in this list.  -Check your blood pressure once per day after you have been sitting/resting.  -Follow up with [place] in 2 weeks for blood pressure recheck.   Treatment Goals:  Goals (1 Years of Data) as of 11/04/13         As of Today As of Today 08/12/13 05/27/13 02/26/13     Blood Pressure    . Blood Pressure < 130/80  140/87 143/84 142/87 160/96 151/85     Lifestyle    . Quit smoking / using tobacco  Yes         Result Component    . LDL CALC < 70    65        Progress Toward Treatment Goals:  Treatment Goal 11/04/2013  Blood pressure improved  Stop smoking -  Prevent falls -    Self Care Goals & Plans:  Self Care Goal 11/04/2013  Manage my medications take my medicines as prescribed; refill my medications on time; bring my medications to every visit  Monitor my health -  Eat healthy foods eat baked foods instead of fried foods; eat foods that are low in salt  Be physically active -  Stop smoking -  Meeting treatment goals -    No flowsheet data found.   Care Management & Community Referrals:  Referral 11/04/2013  Referrals made for care management support none needed  Referrals made to community resources -

## 2013-11-06 ENCOUNTER — Other Ambulatory Visit: Payer: Self-pay | Admitting: Internal Medicine

## 2013-11-06 DIAGNOSIS — H44003 Unspecified purulent endophthalmitis, bilateral: Secondary | ICD-10-CM | POA: Insufficient documentation

## 2013-11-06 DIAGNOSIS — R7881 Bacteremia: Secondary | ICD-10-CM | POA: Insufficient documentation

## 2013-11-06 DIAGNOSIS — J189 Pneumonia, unspecified organism: Secondary | ICD-10-CM | POA: Insufficient documentation

## 2013-11-06 NOTE — Progress Notes (Signed)
Subjective:    Patient Sandra [name]: Sandra [name], female    DOB: [birth year], 53 y.o.   MRN: [id number]  HPI Sandra [name] is a 53 year old woman with PMH significant for HTN, CAD, hx of CVA, who presents accompanied by her husband for HFU. She reports being hospitalized on 5/6 at [name] for LLL pneumonia with eventual transfer to [name] as she became septic with MRSA bacteremia and developed bilateral ocular cellulitis. She was discharged home on 5/16. She explains that she had loss of vision in both eyes but her left eye has regained vision. Her right eye was initially painful but the pain has now resolved though she no longer sees from this eye and is unable to move the eyelid. She reports having Ophthalmological care while at [name] and continues to use eye drops an ointment for both eyes with close follow up this week and next week. She was treated with IV Vancomycin and Cefepime for the PNA and MRSA bacteremia with negative work up for endocarditis. She finished Cefepime on 5/15 but continues on IV Vancomycin, with HH RN coming for labs and support--her husband helps with the administration of IV Vanc via her Left Power PICC. Her IV Abx tx is scheduled to end on 11/22/13. She and her husband report that she has [name] physician's monitoring her labs drawn at home while she is on Vancomycin--the patient is not able to recall the names of her physicians at this time.   She reports that her cough is now only occasional and dry. She denies fever/chills. She has lost almost 14 lbs since becoming sick but reports that her appetite is slowly improving. She is not sure if her BP medications were changed during her recent hospitalization and she continues to take Losartan and HCTZ which she had filled months ago--has not picked up Benicar since her last visit with me in February this year.   Review of Systems  Constitutional: Positive for appetite change. Negative for fever, chills, diaphoresis and  fatigue.  HENT: Negative for facial swelling and rhinorrhea.   Eyes: Positive for redness and visual disturbance. Negative for pain and discharge.  Respiratory: Positive for cough. Negative for shortness of breath and wheezing.   Cardiovascular: Negative for chest pain and leg swelling.  Gastrointestinal: Negative for nausea, vomiting, abdominal pain, diarrhea and constipation.  Genitourinary: Negative for dysuria and difficulty urinating.  Skin: Negative for rash.  Neurological: Negative for dizziness, syncope, weakness, light-headedness and headaches.  Hematological: Negative for adenopathy.  Psychiatric/Behavioral: Negative for behavioral problems and agitation.       Objective:   Physical Exam  Nursing note and vitals reviewed. Constitutional: She is oriented to person, place, and time. She appears well-developed and well-nourished. No distress.  Sitting in wheelchair  Eyes:  No exophthalmus bilaterally.  Left eye with blink to threat. Scattered conjunctival erythema with no signs of trauma, no discharge, no icterus.   Bilateral pupils dilated to 71mm with no reaction to light and no accomodation (pt is on atropine drops).  Right eyelid shut, pt is unable to move eyelid with no blink to threat. No eyelid erythema. Diffuse conjunctival erythema with yellow discoloration, no discharge.   Neck: Normal range of motion.  Cardiovascular: Normal rate and regular rhythm.   Pulmonary/Chest: Effort normal. No respiratory distress. She has no wheezes. She has no rales.  Decreased breath sounds over LLL.   Abdominal: Soft. She exhibits no distension. There is no tenderness.  Musculoskeletal: She exhibits  no edema and no tenderness.  Neurological: She is alert and oriented to person, place, and time.  Skin: Skin is warm and dry. No rash noted. She is not diaphoretic. No erythema. No pallor.  Psychiatric: She has a normal mood and affect. Her behavior is normal.          Assessment &  Plan:

## 2013-11-06 NOTE — Assessment & Plan Note (Addendum)
BP Readings from Last 3 Encounters:  11/04/13 140/87  08/12/13 142/87  05/27/13 160/96    Lab Results  Component Value Date   NA 143 11/04/2013   K 3.5 11/04/2013   CREATININE 1.18* 11/04/2013    Assessment: Blood pressure control: mildly elevated Progress toward BP goal:  improved Comments: She has not started Benicar since her last visit in February. She states that all her BP meds were held while she was in the hospital with sepsis but she has restarted Losartan and HCTZ on her own since her discharge and took these medicines prior to this visit. She is not sure if she is taking metoprolol. She is sure she is not taking amlodipine.  She did not have her medication bottles with her during this visit. Her BP was slightly elevated initially but came down to 140/87. [facility] [place]--they have never filled Rx for her but had Benicar 40-12.5, Norvasc 5mg  BID, metoprolol 25mg  BID ready for her to pick up.   Plan: Medications:  Pt advised to stop taking Losartan and HCTZ and resume Benicar (Olmesartan-HCTZ) and to resume metoprolol 25mg  BID during this visit. Called the pharmacy and stopped Norvasc for now as she may no longer need this Rx since she has lost ~14lbs.  -Addendum: Pt's BMET which returned after this visit revealed Cr increased to 1.18 (BL SCr of 0.7). Will call pt and instruct her to stop HCTZ and ARB at this time ( while on Vancomycin) and to continue metoprolol and Norvasc 10mg . Once her Cr normalizes she might be able to tolerate ARB well and should continue on this medication if so due to her CAD.   Educational resources provided: brochure Self management tools provided: home blood pressure logbook Other plans: Pt encouraged to check her BP at home once daily with follow up in 2 weeks for BP recheck.

## 2013-11-06 NOTE — Assessment & Plan Note (Addendum)
With vision loss of right eye. Continues to use eye drops and eye ointment as instructed. Has very close follow up with [facility], she was told that she may need surgical resection of her right eye.

## 2013-11-06 NOTE — Assessment & Plan Note (Signed)
  Assessment: Progress toward smoking cessation:   Stopped smoking Barriers to progress toward smoking cessation:   None Comments: She reports stopping smoking since her hospitalization in 10/23/13 and no desire to smoke again although her husband continues to smoke and she worries that she may resume smoking.   Plan: Instruction/counseling given:  I commended patient for quitting and reviewed strategies for preventing relapses. Educational resources provided:    Self management tools provided:    Medications to assist with smoking cessation:  None Patient agreed to the following self-care plans for smoking cessation:    Other plans: The patient will follow up in 2-3 weeks, will continue offering support.

## 2013-11-06 NOTE — Assessment & Plan Note (Addendum)
Initial positive blood cultures from 5/6, complicated by bilateral endophthalmitis ~24hrs after hospitalization. Work up negative for endocarditis. She was started on IV Vancomycin and continues this tx at home with [facility] RN, her husband helping with IV infusion. She continues to improve and is doing well as far as her PNA.  She reports having labs drawn at home that are sent to an Infectious Disease doctor at [facility].  The pt continued to take HCTZ and ARB while on Vanc.  BMET checked during this visit with Cr of 1.18 (BL at 0.7). She is at risk for Vancomycin nephrotoxicity.   Plan: Will call pt and advise her to stop ARB and HCTZ. Pt to call [facility] RN for information of Vancomycin monitoring and dose adjustment as indicated given rise in Scr.

## 2013-11-06 NOTE — Assessment & Plan Note (Signed)
LLL PNA complicated by MRSA bacteremia, bilateral endophthalmitis.  Has receveid IV Vancomycin from 5/6 and will continue until 11/22/13.  Has received IV Cefepime tx from 5/6 to 11/01/13.  Her cough has much improved, denies fever/chills, SOB. .  Continues to take Tessalon perls PRN for cough but does not need renewal of Rx at this time.  Had decreased breath sounds at LLL duing this visit. Will consider repeat CXR in 4 weeks if s/s not improving.

## 2013-11-07 ENCOUNTER — Other Ambulatory Visit: Payer: Self-pay | Admitting: Internal Medicine

## 2013-11-07 NOTE — Progress Notes (Signed)
INTERNAL MEDICINE TEACHING ATTENDING ADDENDUM - Aldine Contes, MD: I reviewed and discussed with the resident Dr. Pierro Ludwig, the patient's medical history, physical examination, diagnosis and results of pertinent tests and treatment and I agree with the patient's care as documented.

## 2013-11-08 ENCOUNTER — Telehealth: Payer: Self-pay | Admitting: Internal Medicine

## 2013-11-08 DIAGNOSIS — I10 Essential (primary) hypertension: Secondary | ICD-10-CM

## 2013-11-08 MED ORDER — AMLODIPINE BESYLATE 10 MG PO TABS: 10.0000 mg | ORAL_TABLET | Freq: Every day | ORAL | Status: DC

## 2013-11-08 NOTE — Telephone Encounter (Addendum)
I called [name] on 5/21: She and her husband explained that someone from [facility] had told them that her Vancomycin level was too high, affecting her kidney function and consequentially, Vancomycin therapy had been stopped for now--initial treatment plan was to end this antibiotic on 11/22/13.  They both explained that she had ongoing f/u a [facility] including an MRI on 5/21 for eval of her orbital cellulitis.  [name] reported that she had pain from an Ophthalmological procedure on 5/10 but her surgeon had not prescribed pain medications to her. She requested Rx for Vicodin 5mg  q8h PRN for pain #30 which I was willing to prescribed however, as they live in [place], with current transportation difficulty, they preferred that their Opthalmmologist prescribed this medication (which cannot be called in).  The patient stated that Dr. Bubba Camp had performed her procedure and I called his practice and left a message with his nurse in regards to the patient's Vicodin prescription request.   Due to her recent AKI, I advised [name] to stop her ARB and HCTZ for now (she was taking Benicar). She will start taking Norvasc 10mg  daily which was called in to the pt's pharmacy and will continue taking metoprolol 25mg  BID. The patient acknowledged these medication changes and verified them using the the teach-back method.  It appears that Dr. Maryjean Morn in Infectious Disease at [facility] had been notified of the pt's AKI and was waiting on repeat labs to make a decision on the need to hospitalize the pt.   As of 5/22: She had repeat labs by her [facility] RN and it appears that her Vancomycin level was still elevated with continued increase in her Cr. I called the Infectious Disease Clinic at [facility], spoke with Joycelyn Schmid, RN, who informed me that the patient is currently hospitalized.

## 2013-11-20 ENCOUNTER — Institutional Professional Consult (permissible substitution): Payer: No Typology Code available for payment source | Admitting: Cardiology

## 2013-11-20 DIAGNOSIS — R7881 Bacteremia: Secondary | ICD-10-CM | POA: Diagnosis not present

## 2013-11-21 ENCOUNTER — Telehealth: Payer: Self-pay | Admitting: Internal Medicine

## 2013-11-21 DIAGNOSIS — H44019 Panophthalmitis (acute), unspecified eye: Secondary | ICD-10-CM | POA: Diagnosis not present

## 2013-11-21 NOTE — Telephone Encounter (Signed)
Patient called requesting for a prescription of Tradadol for her right eye pain - which is chronic for her. That eye is blind. She has been using ibuprofen recently but this has not helped much. I advised her to call the clinic tomorrow to request PCP for prescription. She verbalized understanding.

## 2013-11-22 DIAGNOSIS — H5710 Ocular pain, unspecified eye: Secondary | ICD-10-CM | POA: Diagnosis not present

## 2013-11-22 DIAGNOSIS — H4419 Other endophthalmitis: Secondary | ICD-10-CM | POA: Diagnosis not present

## 2013-11-22 DIAGNOSIS — H44009 Unspecified purulent endophthalmitis, unspecified eye: Secondary | ICD-10-CM | POA: Diagnosis not present

## 2013-11-24 ENCOUNTER — Other Ambulatory Visit: Payer: Self-pay | Admitting: Internal Medicine

## 2013-11-25 ENCOUNTER — Encounter: Payer: Self-pay | Admitting: Internal Medicine

## 2013-11-25 ENCOUNTER — Ambulatory Visit (INDEPENDENT_AMBULATORY_CARE_PROVIDER_SITE_OTHER): Payer: Medicare Other | Admitting: Internal Medicine

## 2013-11-25 VITALS — BP 178/103 | HR 66 | Temp 97.1°F | Wt 201.8 lb

## 2013-11-25 DIAGNOSIS — H44003 Unspecified purulent endophthalmitis, bilateral: Secondary | ICD-10-CM

## 2013-11-25 DIAGNOSIS — H44009 Unspecified purulent endophthalmitis, unspecified eye: Secondary | ICD-10-CM | POA: Diagnosis not present

## 2013-11-25 DIAGNOSIS — I10 Essential (primary) hypertension: Secondary | ICD-10-CM

## 2013-11-25 DIAGNOSIS — H5440 Blindness, one eye, unspecified eye: Secondary | ICD-10-CM | POA: Insufficient documentation

## 2013-11-25 DIAGNOSIS — F17200 Nicotine dependence, unspecified, uncomplicated: Secondary | ICD-10-CM | POA: Diagnosis not present

## 2013-11-25 DIAGNOSIS — Z720 Tobacco use: Secondary | ICD-10-CM

## 2013-11-25 DIAGNOSIS — R6889 Other general symptoms and signs: Secondary | ICD-10-CM | POA: Diagnosis not present

## 2013-11-25 DIAGNOSIS — I6350 Cerebral infarction due to unspecified occlusion or stenosis of unspecified cerebral artery: Secondary | ICD-10-CM | POA: Diagnosis not present

## 2013-11-25 DIAGNOSIS — R7881 Bacteremia: Secondary | ICD-10-CM

## 2013-11-25 DIAGNOSIS — A4902 Methicillin resistant Staphylococcus aureus infection, unspecified site: Secondary | ICD-10-CM | POA: Diagnosis not present

## 2013-11-25 DIAGNOSIS — B9562 Methicillin resistant Staphylococcus aureus infection as the cause of diseases classified elsewhere: Secondary | ICD-10-CM

## 2013-11-25 DIAGNOSIS — H5711 Ocular pain, right eye: Secondary | ICD-10-CM | POA: Insufficient documentation

## 2013-11-25 MED ORDER — HYDROCODONE-ACETAMINOPHEN 10-325 MG PO TABS: 1.0000 | ORAL_TABLET | Freq: Every day | ORAL | Status: DC

## 2013-11-25 NOTE — Progress Notes (Signed)
   Subjective:    [name], female    DOB: 1961-02-07, 53 y.o.   MRN: [id number]  HPI Sandra [name] is a 53 year old woman with PMH significant for HTN, CAD, hx of CVA, who presents accompanied by her husband for HFU. She was hospitalized on 5/6 at [facility] for L[facility] pneumonia with eventual transfer to [facility] as she became septic with MRSA bacteremia and developed bilateral ocular cellulitis. She was discharged home on 5/16 with IV Vanc home therapy via PICC line with [facility] RN. She developed AKI and was hospitalized at [facility] again on 5/21 with discontinuation of Vancomycin and initiation of a new IV Daptomycin with [facility] RN labs drawn weekly. She will continue IV Abx until 6/15.   She tells me she has had permanent vision loss of the right eye which has affected her balance and ability to see well; she had one near fall this past week. She continues to have pain in her right eye. The pain responded well to Vicodin while she was in the hospital and she requests another prescription for this medication.   She has follow up appointment with Dr. Mady Gemma in [place] at [facility] on 6/10 and with Dr. Britta Mccreedy in Ophthalmology at [facility] on 6/11.   Her eye drops are expensive (she reports a $200 co-pay) but she and her husband understand that she needs to use them. She will contact her Opthalmologist and request samples.      Review of Systems  Constitutional: Positive for appetite change. Negative for fever, chills, diaphoresis, activity change and fatigue.       Increased appetite   Eyes: Positive for pain.       Right eye pain  Respiratory: Negative for cough, shortness of breath and wheezing.   Cardiovascular: Negative for chest pain, palpitations and leg swelling.  Gastrointestinal: Negative for abdominal pain and constipation.  Endocrine: Positive for cold intolerance and heat intolerance.       She has cold intolerance and at times increased sweating  Genitourinary: Negative for  dysuria.  Skin: Negative for color change, pallor and rash.  Neurological: Negative for dizziness, weakness, light-headedness and headaches.       Gait changes secondary to right eye vision loss  Hematological: Negative for adenopathy.  Psychiatric/Behavioral: Negative for behavioral problems and agitation.       Objective:   Physical Exam  Nursing note and vitals reviewed. Constitutional: She is oriented to person, place, and time. She appears well-developed and well-nourished. No distress.  Eyes: Right eye exhibits no discharge. Left eye exhibits no discharge.  Unable to move right eyelid. No vision of right eye.   Neck: Neck supple.  She has fullness of the lower neck that is soft, non tender. It is difficult to determine if this is her thyroid or due to her body habitus.   Cardiovascular: Normal rate and regular rhythm.   Pulmonary/Chest: Effort normal. No respiratory distress.  Neurological: She is alert and oriented to person, place, and time.  Skin: Skin is warm and dry. She is not diaphoretic.  Psychiatric: She has a normal mood and affect. Her behavior is normal.          Assessment & Plan:

## 2013-11-25 NOTE — Assessment & Plan Note (Signed)
She has fullness at the level of her thyroid on physical exam (though this could be 2/2 to her body habitus) and describes cold/heat intolerance.  -Check TSH.

## 2013-11-25 NOTE — Assessment & Plan Note (Signed)
  Assessment: Progress toward smoking cessation:   None Barriers to progress toward smoking cessation:   stress/anxiety, ongoing acute medical issues Comments: She is back to smoking 1/2 pack per day  Plan: Instruction/counseling given:  I counseled patient on the dangers of tobacco use, advised patient to stop smoking, and reviewed strategies to maximize success. Educational resources provided:    Self management tools provided:    Medications to assist with smoking cessation:  Nicotine Patch Patient agreed to the following self-care plans for smoking cessation:    Other plans: She will resume her plan to quit smoking. She will use her home nicotine patches to aid her in this goal.

## 2013-11-25 NOTE — Assessment & Plan Note (Signed)
Denies fever/chills. She developed AKI with Vancomycin and was started on Daptomycin. She continues to have [facility] RN visit, has PICC line in place. Last IV Abx tx scheduled for 6/15 but is being followed at [facility] ID clinic (Dr. Mady Gemma) for this. Her HH RN draws las once per week which are being monitored by her ID physician.

## 2013-11-25 NOTE — Assessment & Plan Note (Signed)
BP Readings from Last 3 Encounters:  11/25/13 178/103  11/04/13 140/87  08/12/13 142/87    Lab Results  Component Value Date   NA 143 11/04/2013   K 3.5 11/04/2013   CREATININE 1.18* 11/04/2013    Assessment: Blood pressure control:  No controlled Progress toward BP goal:   Not at goal Comments: She is on Norvasc 10mg  daily, Lopressor 25mg  daily. Benicar 40-12.5mg  is currently on hold while she recovers from her recent AKI (last Cr 1.07, BL cr of 0.7). Unfortunately, she has been eating excessively for the past 3 weeks and has gained 10 lbs during this time. She has also resumed smoking 1/2 pack of cigarettes per day.   Plan: Medications:  continue current medications. Will resume Benicar as soon as her Cr is back to baseline. Her HH RN will check BMET tomorrow 6/9 and will fax the results to me.  Educational resources provided:   Self management tools provided:   Other plans: Continue monitoring, pt to resume Benicar 40-12.5 once her renal function is back to baseline. She will follow up in two weeks.

## 2013-11-25 NOTE — Patient Instructions (Addendum)
-  Follow up with your doctors at [facility] working on stopping smoking.  -You have been referred to physical therapy for gait and balance retraining due to your right eye blindness.  -Follow up with [place] in 2 weeks for blood pressure recheck.

## 2013-11-25 NOTE — Assessment & Plan Note (Addendum)
Receiving ongoing care at [facility]. She will call them for assistance with her eye drops.  She has permanent loss of vision in her right eye and was referred to outpatient PT for retraining of her gait. She was encouraged to use a walker for now to prevent falls.  She continue to have right eye pain that improves with Vicodin 10mg  daily.   -Referred to outpatient PT -Rx Vicodin 10mg  once daily for pain #30

## 2013-11-26 DIAGNOSIS — R7881 Bacteremia: Secondary | ICD-10-CM | POA: Diagnosis not present

## 2013-11-26 LAB — TSH: TSH: 2.337 u[IU]/mL (ref 0.350–4.500)

## 2013-11-27 DIAGNOSIS — H44009 Unspecified purulent endophthalmitis, unspecified eye: Secondary | ICD-10-CM | POA: Diagnosis not present

## 2013-11-27 DIAGNOSIS — R7881 Bacteremia: Secondary | ICD-10-CM | POA: Diagnosis not present

## 2013-11-27 DIAGNOSIS — A4902 Methicillin resistant Staphylococcus aureus infection, unspecified site: Secondary | ICD-10-CM | POA: Diagnosis not present

## 2013-11-28 ENCOUNTER — Telehealth: Payer: Self-pay | Admitting: Internal Medicine

## 2013-11-28 ENCOUNTER — Ambulatory Visit: Payer: Medicare Other | Admitting: Cardiovascular Disease

## 2013-11-28 ENCOUNTER — Other Ambulatory Visit: Payer: Self-pay | Admitting: Internal Medicine

## 2013-11-28 DIAGNOSIS — G4700 Insomnia, unspecified: Secondary | ICD-10-CM

## 2013-11-28 DIAGNOSIS — H44009 Unspecified purulent endophthalmitis, unspecified eye: Secondary | ICD-10-CM | POA: Diagnosis not present

## 2013-11-28 DIAGNOSIS — H44019 Panophthalmitis (acute), unspecified eye: Secondary | ICD-10-CM | POA: Diagnosis not present

## 2013-11-28 DIAGNOSIS — H02409 Unspecified ptosis of unspecified eyelid: Secondary | ICD-10-CM | POA: Diagnosis not present

## 2013-11-28 MED ORDER — ESZOPICLONE 2 MG PO TABS: 2.0000 mg | ORAL_TABLET | Freq: Every evening | ORAL | Status: DC | PRN

## 2013-11-28 NOTE — Progress Notes (Signed)
Case discussed with Dr. Kennerly soon after the resident saw the patient.  We reviewed the resident's history and exam and pertinent patient test results.  I agree with the assessment, diagnosis, and plan of care documented in the resident's note. 

## 2013-11-28 NOTE — Telephone Encounter (Signed)
  INTERNAL MEDICINE RESIDENCY PROGRAM After-Hours Telephone Call    Reason for call:   I received a call from [name] around 8p on 11/28/2013 indicating she would like a refill of her lunesta.  She reports she has been under significant stress recently, and was seen at [facility] today and told she was going to lose one of her eyes.  She had been on Lunesta in the past, but has not gotten it in a year due to cost, but her Medicaid was just approved yesterday.  She has been very upset given recent circumstances (hospitalized 5/6 at [facility] for LLL PNA, but transferred to [facility] after becoming septic with MRSA bacteremia and developed b/l ocular cellulitis - now on IV Vanc).  Because of all of this, she has been unable to sleep.      Assessment / Plan / Recommendations:   Chart reviewed, she has been on Lunesta 2mg  qHS in the past.  This was last discussed with her PCP, Dr. Beshara Ludwig, on 08/13/13 - at that time she was on Klonopin.    Given her significant stress, and she has finally gotten Medicaid, I would like to offer her relief by providing a short course of lunesta - 2mg  qHS prn #10 called into [facility] on [address] [place] [phone]).  To note, she denies SOB or difficulty breathing, so I am not concerned that this will depress her respiratory drive.   If she would like to continue this medication in the long term, she will need to further discuss at a clinic appointment.  This plan was also discussed with Dr. Ordway Ludwig, her PCP, who happened to be in house as well.      Othella Boyer, MD   11/28/2013, 9:32 PM

## 2013-12-03 DIAGNOSIS — R7881 Bacteremia: Secondary | ICD-10-CM | POA: Diagnosis not present

## 2013-12-05 NOTE — Addendum Note (Signed)
Addended by: Hulan Fray on: 12/05/2013 05:22 PM   Modules accepted: Orders

## 2013-12-10 ENCOUNTER — Telehealth: Payer: Self-pay | Admitting: Licensed Clinical Social Worker

## 2013-12-10 NOTE — Telephone Encounter (Signed)
CSW received voice mail from pt's spouse, requesting PCS request.  Mr. Heberlein states pt in need of new form because pt lost medicaid benefits and services and now has benefits again.  Mr. Laszlo states pt condition has declined since admission at [facility] and [place] will initiate PCS request.

## 2013-12-11 ENCOUNTER — Telehealth: Payer: Self-pay | Admitting: *Deleted

## 2013-12-11 NOTE — Telephone Encounter (Signed)
Pt's medicaid reinstated.  Received PA request from pt's pharmacy regarding her eszopiclone 65m tabs #30.  Medication is non-preferred, preferred medications include the follow meds below :  estazolam (generic for Prosom)   flurazepam (generic for Dalmane)   temazepam 170m 3043mgeneric for Restoril)   triazolam (generic for Halcion)   zolpidem (generic for Ambien)   Although these are preferred meds, Medicaid will only pay for #15 a mth.  A PA can be completed by MD if qty exceeds the limit.GolDespina Hiddenssady6/24/20154:36 [name]

## 2013-12-11 NOTE — Telephone Encounter (Signed)
Pharmacy did not have Rx from 11/28/13 from Dr Rabold Ludwig.. Rx order was given to [facility]. Hilda Blades Byrd Terrero RN 12/11/13 11:45AM

## 2013-12-12 NOTE — Telephone Encounter (Signed)
She has tried Ambien before with no improvement of her insomnia. The other medications are benzodiazapines and can interfere with her anxiety medications. Could we get prior-authorization for Lunesta #30? I'd rather keep her on Lunesta.    Thanks!  SK

## 2013-12-13 NOTE — Telephone Encounter (Signed)
PA request for the [facility] submitted online via [place] [employer] . Request sent for "review"   Conf# [id number] W Pt's Medicaid # [id number]

## 2013-12-16 NOTE — Telephone Encounter (Signed)
Follow up call make to [facility]- Medication approved for one year   Medicaid ID: [id number]  APPROVED 12/13/2013 - 06/11/2014

## 2013-12-18 ENCOUNTER — Telehealth: Payer: Self-pay | Admitting: *Deleted

## 2013-12-18 NOTE — Telephone Encounter (Signed)
Call to [facility] for Prior Authorization for Lipofen 50 mg capsules.  Denied pt will need to try and fail Gemfibrozil first.  Sander Nephew, RN 12/18/2013 2:38 PM.

## 2013-12-19 ENCOUNTER — Other Ambulatory Visit: Payer: Self-pay | Admitting: Internal Medicine

## 2013-12-19 DIAGNOSIS — E785 Hyperlipidemia, unspecified: Secondary | ICD-10-CM

## 2013-12-19 DIAGNOSIS — E781 Pure hyperglyceridemia: Secondary | ICD-10-CM

## 2013-12-19 MED ORDER — ATORVASTATIN CALCIUM 10 MG PO TABS: 10.0000 mg | ORAL_TABLET | Freq: Every day | ORAL | Status: DC

## 2013-12-19 NOTE — Progress Notes (Signed)
The patient has recently had Medicaid reinstated and Lipofen will no longer be covered, she will have to do a trial of Gemfibrozil.  On reviewing her chart, she has been on on low intensity statin (pravastatin) for a while with Lipofen. Although she has not complained of myopathy-like symptoms, the combination of the statin with Lipofen or gemfibrozil carries the increased risk of rhabdomyolysis and myopathy.    I discussed these possible medication side effects at this time with Pilar Plate, our main inpatient Pharmacist and he recommends using a high intensity statin v low int statin+gemfibrozil.   In reviewing the patient's lipid profile, she has had LDL of 65 as recent as 08/12/13 with triglycerides of 259 (was 264 before initiation of tx with fenofibrate).   At this time, I would like to start her on high intensity statin.   Rx Lipitor 10mg  daily.    Blain Pais, MD PGY-3 IMTS 12:11 PM

## 2013-12-19 NOTE — Telephone Encounter (Signed)
Hi Ms. Regino Schultze,    I stopped the fenofibrate (Lipofen or Gemfibrozil) and the pravastatin. I want to start her on Lipitor 10mg  daily. If Medicaid approves it I will call and let the patient know about these medication changes.   Thanks!  SK

## 2013-12-23 ENCOUNTER — Encounter: Payer: Self-pay | Admitting: Internal Medicine

## 2013-12-23 ENCOUNTER — Encounter: Payer: Self-pay | Admitting: *Deleted

## 2013-12-23 ENCOUNTER — Ambulatory Visit (INDEPENDENT_AMBULATORY_CARE_PROVIDER_SITE_OTHER): Payer: Medicare HMO | Admitting: Internal Medicine

## 2013-12-23 ENCOUNTER — Encounter (HOSPITAL_COMMUNITY): Payer: Self-pay | Admitting: Emergency Medicine

## 2013-12-23 ENCOUNTER — Observation Stay (HOSPITAL_COMMUNITY)
Admission: EM | Admit: 2013-12-23 | Discharge: 2013-12-24 | Disposition: A | Discharge status: Home / Self Care | Payer: Medicare HMO | Attending: Oncology | Admitting: Oncology

## 2013-12-23 ENCOUNTER — Other Ambulatory Visit: Payer: Self-pay | Admitting: Internal Medicine

## 2013-12-23 ENCOUNTER — Other Ambulatory Visit: Payer: Self-pay

## 2013-12-23 VITALS — BP 159/87 | HR 75 | Temp 97.0°F | Ht 65.0 in | Wt 192.4 lb

## 2013-12-23 DIAGNOSIS — E876 Hypokalemia: Principal | ICD-10-CM | POA: Insufficient documentation

## 2013-12-23 DIAGNOSIS — F3289 Other specified depressive episodes: Secondary | ICD-10-CM | POA: Diagnosis not present

## 2013-12-23 DIAGNOSIS — H44009 Unspecified purulent endophthalmitis, unspecified eye: Secondary | ICD-10-CM

## 2013-12-23 DIAGNOSIS — Z9861 Coronary angioplasty status: Secondary | ICD-10-CM | POA: Diagnosis not present

## 2013-12-23 DIAGNOSIS — I10 Essential (primary) hypertension: Secondary | ICD-10-CM | POA: Insufficient documentation

## 2013-12-23 DIAGNOSIS — I2510 Atherosclerotic heart disease of native coronary artery without angina pectoris: Secondary | ICD-10-CM | POA: Diagnosis not present

## 2013-12-23 DIAGNOSIS — Z7902 Long term (current) use of antithrombotics/antiplatelets: Secondary | ICD-10-CM | POA: Insufficient documentation

## 2013-12-23 DIAGNOSIS — H44003 Unspecified purulent endophthalmitis, bilateral: Secondary | ICD-10-CM

## 2013-12-23 DIAGNOSIS — I252 Old myocardial infarction: Secondary | ICD-10-CM | POA: Insufficient documentation

## 2013-12-23 DIAGNOSIS — F17200 Nicotine dependence, unspecified, uncomplicated: Secondary | ICD-10-CM | POA: Diagnosis not present

## 2013-12-23 DIAGNOSIS — F329 Major depressive disorder, single episode, unspecified: Secondary | ICD-10-CM | POA: Insufficient documentation

## 2013-12-23 DIAGNOSIS — Z8673 Personal history of transient ischemic attack (TIA), and cerebral infarction without residual deficits: Secondary | ICD-10-CM | POA: Diagnosis not present

## 2013-12-23 DIAGNOSIS — M109 Gout, unspecified: Secondary | ICD-10-CM | POA: Insufficient documentation

## 2013-12-23 DIAGNOSIS — L408 Other psoriasis: Secondary | ICD-10-CM | POA: Insufficient documentation

## 2013-12-23 DIAGNOSIS — F411 Generalized anxiety disorder: Secondary | ICD-10-CM | POA: Insufficient documentation

## 2013-12-23 DIAGNOSIS — M47817 Spondylosis without myelopathy or radiculopathy, lumbosacral region: Secondary | ICD-10-CM

## 2013-12-23 DIAGNOSIS — N179 Acute kidney failure, unspecified: Secondary | ICD-10-CM

## 2013-12-23 DIAGNOSIS — E785 Hyperlipidemia, unspecified: Secondary | ICD-10-CM | POA: Diagnosis not present

## 2013-12-23 DIAGNOSIS — R799 Abnormal finding of blood chemistry, unspecified: Secondary | ICD-10-CM | POA: Diagnosis present

## 2013-12-23 LAB — BASIC METABOLIC PANEL
ANION GAP: 16 — AB (ref 5–15)
BUN: 10 mg/dL (ref 6–23)
CO2: 21 mEq/L (ref 19–32)
Calcium: 8 mg/dL — ABNORMAL LOW (ref 8.4–10.5)
Chloride: 109 mEq/L (ref 96–112)
Creatinine, Ser: 0.54 mg/dL (ref 0.50–1.10)
GFR calc Af Amer: >90 mL/min (ref >90)
GLUCOSE: 161 mg/dL — AB (ref 70–99)
Potassium: 2.8 mEq/L — CL (ref 3.7–5.3)
Sodium: 146 mEq/L (ref 137–147)

## 2013-12-23 LAB — CBC WITH DIFFERENTIAL/PLATELET
BASOS PCT: 0 % (ref 0–1)
Basophils Absolute: 0 10*3/uL (ref 0.0–0.1)
Eosinophils Absolute: 0.5 10*3/uL (ref 0.0–0.7)
Eosinophils Relative: 6 % — ABNORMAL HIGH (ref 0–5)
HCT: 34.6 % — ABNORMAL LOW (ref 36.0–46.0)
Hemoglobin: 11.8 g/dL — ABNORMAL LOW (ref 12.0–15.0)
LYMPHS ABS: 2.4 10*3/uL (ref 0.7–4.0)
Lymphocytes Relative: 29 % (ref 12–46)
MCH: 29.5 pg (ref 26.0–34.0)
MCHC: 34.1 g/dL (ref 30.0–36.0)
MCV: 86.5 fL (ref 78.0–100.0)
Monocytes Absolute: 0.4 10*3/uL (ref 0.1–1.0)
Monocytes Relative: 5 % (ref 3–12)
Neutro Abs: 5 10*3/uL (ref 1.7–7.7)
Neutrophils Relative %: 60 % (ref 43–77)
PLATELETS: 216 10*3/uL (ref 150–400)
RBC: 4 MIL/uL (ref 3.87–5.11)
RDW: 14.4 % (ref 11.5–15.5)
WBC: 8.3 10*3/uL (ref 4.0–10.5)

## 2013-12-23 LAB — COMPREHENSIVE METABOLIC PANEL
ALBUMIN: 3.8 g/dL (ref 3.5–5.2)
ALK PHOS: 84 U/L (ref 39–117)
ALT: 12 U/L (ref 0–35)
AST: 13 U/L (ref 0–37)
Anion gap: 18 — ABNORMAL HIGH (ref 5–15)
BUN: 11 mg/dL (ref 6–23)
CO2: 21 mEq/L (ref 19–32)
Calcium: 9 mg/dL (ref 8.4–10.5)
Chloride: 108 mEq/L (ref 96–112)
Creatinine, Ser: 0.54 mg/dL (ref 0.50–1.10)
GFR calc Af Amer: >90 mL/min (ref >90)
GFR calc non Af Amer: >90 mL/min (ref >90)
Glucose, Bld: 109 mg/dL — ABNORMAL HIGH (ref 70–99)
POTASSIUM: 2.6 meq/L — AB (ref 3.7–5.3)
SODIUM: 147 meq/L (ref 137–147)
Total Bilirubin: <0.2 mg/dL — ABNORMAL LOW (ref 0.3–1.2)
Total Protein: 6.8 g/dL (ref 6.0–8.3)

## 2013-12-23 LAB — URINALYSIS, ROUTINE W REFLEX MICROSCOPIC
Bilirubin Urine: NEGATIVE
GLUCOSE, UA: NEGATIVE mg/dL
HGB URINE DIPSTICK: NEGATIVE
Ketones, ur: NEGATIVE mg/dL
Nitrite: POSITIVE — AB
Protein, ur: NEGATIVE mg/dL
SPECIFIC GRAVITY, URINE: 1.022 (ref 1.005–1.030)
Urobilinogen, UA: 0.2 mg/dL (ref 0.0–1.0)
pH: 6 (ref 5.0–8.0)

## 2013-12-23 LAB — URINE MICROSCOPIC-ADD ON

## 2013-12-23 LAB — BASIC METABOLIC PANEL WITH GFR
BUN: 11 mg/dL (ref 6–23)
CO2: 24 mEq/L (ref 19–32)
Calcium: 8.8 mg/dL (ref 8.4–10.5)
Chloride: 107 mEq/L (ref 96–112)
Creat: 0.5 mg/dL (ref 0.50–1.10)
GFR, Est African American: >89 mL/min
GFR, Est Non African American: >89 mL/min
Glucose, Bld: 101 mg/dL — ABNORMAL HIGH (ref 70–99)
Potassium: 2.5 mEq/L — CL (ref 3.5–5.3)
Sodium: 145 mEq/L (ref 135–145)

## 2013-12-23 LAB — TROPONIN I: Troponin I: <0.3 ng/mL (ref <0.30)

## 2013-12-23 LAB — MAGNESIUM: MAGNESIUM: 1.7 mg/dL (ref 1.5–2.5)

## 2013-12-23 MED ORDER — HYDROCODONE-ACETAMINOPHEN 10-325 MG PO TABS: 1.0000 | ORAL_TABLET | Freq: Two times a day (BID) | ORAL | Status: DC | PRN

## 2013-12-23 MED ORDER — POTASSIUM CHLORIDE CRYS ER 20 MEQ PO TBCR
20.0000 meq | EXTENDED_RELEASE_TABLET | Freq: Once | ORAL | Status: AC
  Administered 2013-12-23: 20 meq via ORAL
  Filled 2013-12-23: qty 1

## 2013-12-23 MED ORDER — HYDROCODONE-ACETAMINOPHEN 5-325 MG PO TABS
2.0000 | ORAL_TABLET | Freq: Once | ORAL | Status: AC
  Administered 2013-12-23: 2 via ORAL
  Filled 2013-12-23: qty 2

## 2013-12-23 MED ORDER — POTASSIUM CHLORIDE 10 MEQ/100ML IV SOLN
10.0000 meq | INTRAVENOUS | Status: AC
  Administered 2013-12-23 (×2): 10 meq via INTRAVENOUS
  Filled 2013-12-23 (×3): qty 100

## 2013-12-23 MED ORDER — POTASSIUM CHLORIDE 10 MEQ/100ML IV SOLN
10.0000 meq | INTRAVENOUS | Status: DC
  Administered 2013-12-23: 10 meq via INTRAVENOUS

## 2013-12-23 NOTE — ED Notes (Signed)
Resident at bedside discussing plan of care, aware of potassium 2.8 EDP rancour as well aware of potassium results.

## 2013-12-23 NOTE — Patient Instructions (Addendum)
1. Please keep your appointment with occuloplastics at [facility].   2. Please take all medications as prescribed.    3. If you have worsening of your symptoms or new symptoms arise, please call the clinic ([phone]), or go to the ER immediately if symptoms are severe.

## 2013-12-23 NOTE — Assessment & Plan Note (Addendum)
Limits ambulation.  Rx for wheelchair provided today.

## 2013-12-23 NOTE — ED Notes (Signed)
Pt unable to urinate at this time.  

## 2013-12-23 NOTE — Progress Notes (Signed)
   Subjective:    [name], female    DOB: August 25, 1960, 53 y.o.   MRN: [id number]  HPI Comments: Sandra [name] is a 53 year old woman with a PMH of CAD.  Had recent MRSA + endophthalmitis OU with NLP in OD trx with IV antibiotics x 6 weeks, s/p pars plana vitrectomy OD.  Continues to have intermittent, sever right eye pain.  No improvement with ibuprofen.  Vicodin helps but she has run out.  She reports taking Vicodin no more than twice daily and she sometimes goes a few days without pain so does not need it.  Left eye with preserved vision and no pain or discharge.  Eye Pain  The right eye is affected.This is a chronic problem. The current episode started more than 1 month ago. The problem occurs every several days. The problem has been gradually improving. The pain is at a severity of 10/10. The pain is severe. There is no known exposure to pink eye. She does not wear contacts. Pertinent negatives include no fever, nausea or vomiting. The treatment provided significant relief.      Review of Systems  Constitutional: Negative for fever, chills and appetite change.  Eyes: Positive for pain and visual disturbance.  Respiratory: Negative for cough and shortness of breath.   Cardiovascular: Negative for chest pain.  Gastrointestinal: Positive for diarrhea. Negative for nausea and vomiting.       For past week  Genitourinary: Negative for dysuria and frequency.  Neurological: Negative for headaches.       Objective:   Physical Exam  Constitutional: She is oriented to person, place, and time. She appears well-developed. No distress.  HENT:  Mouth/Throat: Oropharynx is clear and moist. No oropharyngeal exudate.  Eyes: Right eye exhibits no discharge. Left eye exhibits no discharge.  OS - PERRL and EOMI.  OD - w/ surgical changes s/p vitrectomy, no light perception.  Cardiovascular: Normal rate and regular rhythm.  Exam reveals no gallop and no friction rub.   No murmur  heard. Pulmonary/Chest: Effort normal and breath sounds normal. No respiratory distress. She has no wheezes. She has no rales.  Abdominal: Soft. Bowel sounds are normal. She exhibits no distension. There is no tenderness.  Musculoskeletal: Normal range of motion. She exhibits no edema and no tenderness.  Neurological: She is alert and oriented to person, place, and time. No cranial nerve deficit.  Skin: Skin is warm. She is not diaphoretic.  Psychiatric: She has a normal mood and affect. Her behavior is normal.          Assessment & Plan:  Please see problem based assessment and plan.

## 2013-12-23 NOTE — ED Provider Notes (Signed)
Date: 12/23/2013  Rate: 81  Rhythm: normal sinus rhythm  QRS Axis: normal  Intervals: normal  ST/T Wave abnormalities: nonspecific ST/T changes  Conduction Disutrbances:none  Narrative Interpretation:   Old EKG Reviewed: unchanged    Ezequiel Essex, MD 12/23/13 1749

## 2013-12-23 NOTE — H&P (Signed)
Date: 12/24/2013               Patient Name:  Sandra [name] MRN: [id number]  DOB: 12/24/1960 Age / Sex: 53 y.o., female   PCP: Blain Pais, MD         Medical Service: Internal Medicine Teaching Service         Attending Physician: Dr. Annia Belt, MD    First Contact: Dr. Ethelene Hal Pager: [phone]  Second Contact: Dr. Algis Liming Pager: [phone]       After Hours (After 5p/  First Contact Pager: [phone]  weekends / holidays): Second Contact Pager: [phone]   Chief Complaint: hypokalemia  History of Present Illness: Mrs. Hasten is a 53 y.o. Female with PMHx of CAD and MI s/p stent in 2007, h/o MCA stroke in 2012, HTN, HLD, tobacco abuse, depression, and MRSA endophthalmitis who presented to the Internal Medicine clinic this afternoon. Pt was found to be hypokalemic at 2.5 on labs. Pt was sent to the ED for replacement. Pt does admit to non-bloody, non-odorous, non-watery diarrhea for 1 week. She has 2-3 bowel movements a day and has not tried anything for it. She denies any sick contacts. Denies any fever or chills. She has been taking all of her medications as prescribed. Pt admits that she has been under a lot of stress recently with her loss of vision in her right eye. Pt was on antibiotics for 6 weeks for her endophthalmitis. Pt reports that she has had low potassium recently. A nurse who comes to her house recently drew labs reporting low K. She was sent a weeks supply of KCl and took it has prescribed. That was on 12/06/13. Pt denies any chest pain, shortness of breath, heart palpations, weakness, muscle cramps, nausea, vomiting, or HA.  Meds: Current Facility-Administered Medications  Medication Dose Route Frequency Provider Last Rate Last Dose  . allopurinol (ZYLOPRIM) tablet 300 mg  300 mg Oral Daily Corky Sox, MD      . amLODipine (NORVASC) tablet 10 mg  10 mg Oral Daily Corky Sox, MD      . atropine 1 % ophthalmic solution 1 drop  1 drop Both Eyes Daily Corky Sox, MD       . clonazePAM [facility]) tablet 1 mg  1 mg Oral BID PRN Corky Sox, MD      . clopidogrel (PLAVIX) tablet 75 mg  75 mg Oral Daily Corky Sox, MD      . dorzolamide-timolol (COSOPT) 22.3-6.8 MG/ML ophthalmic solution 1 drop  1 drop Right Eye BID Corky Sox, MD      . DULoxetine (CYMBALTA) DR capsule 20 mg  20 mg Oral Daily Corky Sox, MD      . enoxaparin (LOVENOX) injection 40 mg  40 mg Subcutaneous Q24H Corky Sox, MD      . magnesium sulfate IVPB 1 g 100 mL  1 g Intravenous Once Corky Sox, MD      . metoprolol tartrate (LOPRESSOR) tablet 25 mg  25 mg Oral BID Corky Sox, MD      . potassium chloride 10 mEq in 100 mL IVPB  10 mEq Intravenous Q1 Hr x 4 Corky Sox, MD   10 mEq at 12/24/13 0041  . potassium chloride SA (K-DUR,KLOR-CON) CR tablet 40 mEq  40 mEq Oral Once Corky Sox, MD      . zolpidem [facility]) tablet 5 mg  5 mg Oral QHS PRN  Corky Sox, MD        Allergies: Allergies as of 12/23/2013 - Review Complete 12/23/2013  Allergen Reaction Noted  . Azor [amlodipine-olmesartan] Other (See Comments) 01/27/2011  . Cozaar [losartan potassium]  02/22/2013   Past Medical History  Diagnosis Date  . PFO (patent foramen ovale) August 2012    PFO seen on TEE during hospitalization in 01/2011.  Patient to f/u with Dr. Leonie Man, neurology, for enrollment in trial for medical treatment of PFO  . CAD (coronary artery disease) 2007    istory of MI with stent in 2007 by Dr. Chancy Milroy, [facility]. Stent placement by Dr Cleatis Polka   . Prediabetes 03/07/2011  . Hypertension 03/07/2011  . Hyperlipidemia 03/07/2011  . Tobacco abuse 03/07/2011  . Depression 03/07/2011  . Psoriasis 03/07/2011  . Acute right MCA stroke 01/27/11    Acute R MCA stroked  with L arm and leg weakness  . Anxiety 1980s    On Klonipin since age 62. Had addiction problem with Xanax which  Was therefore d/c . Seen in the past at [facility].   . Fractured toe 01/2011    History of left fifth toe proximal  phalanx fracture when patient had a stroke (01/2011) and fell. Seen by Dr Doran Durand.   . Incontinence 04/14/2011  . Arthritis 04/16/2011  . Tobacco abuse 03/07/2011    Quit 01/2011   . Fracture of fifth toe, left, closed 01/28/2011    History of left fifth toe proximal phalanx fracture when patient had a stroke (01/2011) and fell. Seen by Dr Doran Durand.    . Substance abuse     h/o narcotic abuse pt denies as of 01/09/12  . Myocardial infarct, old 2008    stents   Past Surgical History  Procedure Laterality Date  . Cardiac stent      2008   Family History  Problem Relation Age of Onset  . Heart disease Mother   . Diabetes Mother   . Heart disease Father   . Diabetes Father   . Heart disease Brother   . Diabetes Brother   . Heart disease Maternal Grandmother   . Heart disease Maternal Grandfather   . Heart disease Paternal Grandmother   . Heart disease Paternal Grandfather   . Lung cancer Brother   . Heart disease Brother    History   Social History  . Marital Status: Married    Spouse Name: N/A    Number of Children: 1  . Years of Education: N/A   Occupational History  . Disabled    Social History Main Topics  . Smoking status: Current Every Day Smoker -- 0.50 packs/day for 30 years    Types: Cigarettes    Last Attempt to Quit: 10/18/2013  . Smokeless tobacco: Never Used     Comment: 1 pack/day  . Alcohol Use: No  . Drug Use: No  . Sexual Activity: Not on file   Other Topics Concern  . Not on file   Social History Narrative   Pt lives at home with sister and brother pt quit smoking 01/2011 pt denies drinking or using any illicit  drugs. Pt is a high school grad.    Review of Systems: General: Denies fever, chills, diaphoresis, appetite change and fatigue.  Respiratory: Denies SOB, DOE, cough, chest tightness, and wheezing.   Cardiovascular: Denies chest pain and palpitations.  Gastrointestinal: Admits to diarrhea as described above. Denies nausea, vomiting, abdominal  pain, constipation, blood in stool and abdominal distention.  Genitourinary: Denies dysuria, urgency, frequency, hematuria, and flank pain. Endocrine: Denies hot or cold intolerance, polyuria, and polydipsia. Musculoskeletal: Denies myalgias, back pain, joint swelling, arthralgias and gait problem.  Skin: Denies pallor, rash and wounds.  Neurological: Denies dizziness, seizures, syncope, weakness, lightheadedness, numbness and headaches.  Psychiatric/Behavioral: Denies mood changes, confusion, nervousness, sleep disturbance and agitation.   Physical Exam: Filed Vitals:   12/23/13 2245 12/23/13 2315 12/24/13 0000 12/24/13 0037  BP: 151/77 135/79 138/76 147/76  Pulse: 62 62 66 66  Temp:    97.7 F (36.5 C)  TempSrc:    Oral  Resp:   16 18  Height:    5' 5"  (1.651 m)  Weight:    194 lb (87.998 kg)  SpO2: 93% 95% 97% 99%   General: Vital signs reviewed.  Patient is a well-developed and well-nourished, in no acute distress and cooperative with exam.  Head: Normocephalic and atraumatic. Eyes: R eye s/p vitrectomy, no light perception. R lid partial closed. No exudate from either eye. No scleral icterus.  Neck: Supple, trachea midline, normal ROM, No JVD, masses, thyromegaly, or carotid bruit present.  Cardiovascular: RRR, S1 normal, S2 normal, no murmurs, gallops, or rubs. Pulmonary/Chest: CTA bilaterally bilaterally, no wheezes, rales, or rhonchi. Abdominal: Soft, non-tender, non-distended, BS +, no masses, or guarding present.  Musculoskeletal: No joint deformities, erythema, or stiffness, ROM full and nontender. Extremities: No swelling or edema,  pulses symmetric and intact bilaterally. No cyanosis or clubbing. Neurological: A&O x3, Strength is normal and symmetric bilaterally, no focal motor deficit, sensory intact to light touch bilaterally.  Skin: Warm, dry and intact. No rashes or erythema. Psychiatric: Normal mood and affect. speech and behavior is normal. Cognition and memory are  normal.    Lab results: Basic Metabolic Panel:  Recent Labs  12/23/13 1803 12/23/13 2125  NA 147 146  K 2.6* 2.8*  CL 108 109  CO2 21 21  GLUCOSE 109* 161*  BUN 11 10  CREATININE 0.54 0.54  CALCIUM 9.0 8.0*  MG 1.7  --    Liver Function Tests:  Recent Labs  12/23/13 1803  AST 13  ALT 12  ALKPHOS 84  BILITOT <0.2*  PROT 6.8  ALBUMIN 3.8   CBC:  Recent Labs  12/23/13 1803  WBC 8.3  NEUTROABS 5.0  HGB 11.8*  HCT 34.6*  MCV 86.5  PLT 216   Cardiac Enzymes:  Recent Labs  12/23/13 1803  TROPONINI <0.30   Urinalysis:  Recent Labs  12/23/13 2009  COLORURINE YELLOW  LABSPEC 1.022  PHURINE 6.0  GLUCOSEU NEGATIVE  HGBUR NEGATIVE  BILIRUBINUR NEGATIVE  KETONESUR NEGATIVE  PROTEINUR NEGATIVE  UROBILINOGEN 0.2  NITRITE POSITIVE*  LEUKOCYTESUR SMALL*    Other results: EKG: normal EKG, normal sinus rhythm, unchanged from previous tracings, normal sinus rhythm. HR 81 bpm  Assessment & Plan by Problem:  Hypokalemia: Pt was found to have hypokalemia of 2.5 in outpatient internal medicine clinic. Pt denies any chest pain, SOB, heart palpitations, muscle weakness or cramps, N or V. EKG shows NSR. No ST or T wave changes. After 60 mEq of K, current K is 2.8. May be minimally contributed to by lower GI losses. Pt reports 1 week of loose stools, 2-3 bowel movements a day. Pt denies watery, odorous stools, but after recent course of antibiotics, there may be concern for C. Diff. We determined this was low probability, but may need to be considered if stools change in frequency or character. Pt seems to be chronically hypokalemic.  Her magnesium level was 1.7 on low end of normal. Hypomagnesemia releases MG-mediated inhibtion of ROMK channels and can therefore lead to K secretion. There is a possibility RTA type I or II; however, our patient has close to normal bicarb of 21. Hypokalemia could be 2/2 to RTA, hypomagnesemia and lower GI losses. After careful review of  medications, only Cymbalta seems to cause hypokalemia in only 1% of patients, and pt started this medication yesterday making it an unlikely cause of hypokalemia in our patient. - NPO - Bed rest - KCl 10 mEq in 100 mL 1VPB x 4 runs - Magnesium sulfate IVPD 1 g once - Repeat BMP in am - Repeat Mg in am - Plan to give another 40 mEq K in the am - If strong concern for C. Diff, c/s stool PCR   CAD s/p stent: Pt has history of MI with stent placement in 2007 by Dr. Chancy Milroy at [facility].  EKG shows NSR. No ST or T wave changes. Troponins negative x 1. Pt denies any chest pain, shortness of breath or chest tightness. Pt takes Plavix 75 mg daily, Metoprolol 25 mg BID, and Amlodipine 10 mg daily at home. Pt continues to smoke. - Plavix 75 mg daily - Metoprolol 25 mg BID - Amlodipine 10 mg daily  HTN: Current blood pressure is 147/76. Pt is on Metoprolol 25 mg BID and Amlodipine 10 mg daily at home. - Metoprolol 25 mg BID - Amlodipine 10 mg daily  HLD: Hx of HLD. On Plavix 75 mg daily. - Plavix 75 mg daily  Hx of CVA: Pt had an acute R MCA stroke with left arm and leg weakness in August 2012. No focal deficits, no lateralization on exam. Current BP 147/76. Pt takes Plavix 75 mg daily, Metoprolol 25 mg BID, and Amlodipine 10 mg daily at home. Current blood pressure is 147/76. - Plavix 75 mg daily - Metoprolol 25 mg BID - Amlodipine 10 mg daily  Endophthalmitis: Hx of recent MRSA + endophthalmitis treated with IV antibiotics x 6 weeks, s/p pars plana vitrectomy OD. Follows with Drs. McClintock and Building services engineer at [place] to have intermittent right eye pain. No exudate currently. Pt denies any fever or chills. WBC 8.3, no leukocytosis. On Cosopt ophthalmic solution and Atropine 1% ophthalmic solution at home. - Cosopt ophthalmic solution 1 drop right eye BID - Atropine 1% ophthalmic solution 1 drop both eyes daily  Hx of Gout: On allopurinol 300 mg daily. No current joint pains or  edema. -Continue allopurinol 300 mg daily.  Depression/Anxiety: Pt is on Lunest 2 mg QHS, Klonopin 74m BID prn anxiety and Cymbalta 20 mg daily at home. - Cymbalta 20 mg daily - Ambien 5 mg prn insomnia - Klonopin 134mBID prn anxiety  DVT ppx: Lovenox 40 mg daily  Dispo: Disposition is deferred at this time, awaiting improvement of current medical problems. Anticipated discharge in approximately 1-2 day(s).   The patient does have a current PCP (SBlain PaisMD) and [facility].  The patient does not have transportation limitations that hinder transportation to clinic appointments.  Signed: AlOsa CraverDO PGY-1 Internal Medicine Resident Pager # [phone]/12/2013 1:18 AM

## 2013-12-23 NOTE — ED Notes (Signed)
Pt just had labs drawn at family practice and they told her to come to ed for low potassium. She c/o chronic R eye pain today. shes a&ox4, breathing easily

## 2013-12-23 NOTE — ED Provider Notes (Signed)
CSN: [id number]     Arrival date & time 12/23/13  1715 History   First MD Initiated Contact with Patient 12/23/13 1738     Chief Complaint  Patient presents with  . Abnormal Lab     (Consider location/radiation/quality/duration/timing/severity/associated sxs/prior Treatment) HPI 53 year old female that presents from clinic with hypokalemia. The patient had labs drawn today and was informed to come to the emergency department for replacement. Patient has significant past medical history is pneumonia leading to sepsis previously this year as well as hypertension CAD. Currently patient is completely asymptomatic, denies chest pain, SOB, nausea/vomiting, heart palpitations.   Past Medical History  Diagnosis Date  . PFO (patent foramen ovale) August 2012    PFO seen on TEE during hospitalization in 01/2011.  Patient to f/u with Dr. Leonie Man, neurology, for enrollment in trial for medical treatment of PFO  . CAD (coronary artery disease) 2007    istory of MI with stent in 2007 by Dr. Chancy Milroy, [facility]. Stent placement by Dr Cleatis Polka   . Prediabetes 03/07/2011  . Hypertension 03/07/2011  . Hyperlipidemia 03/07/2011  . Tobacco abuse 03/07/2011  . Depression 03/07/2011  . Psoriasis 03/07/2011  . Acute right MCA stroke 01/27/11    Acute R MCA stroked  with L arm and leg weakness  . Anxiety 1980s    On Klonipin since age 38. Had addiction problem with Xanax which  Was therefore d/c . Seen in the past at [facility].   . Fractured toe 01/2011    History of left fifth toe proximal phalanx fracture when patient had a stroke (01/2011) and fell. Seen by Dr Doran Durand.   . Incontinence 04/14/2011  . Arthritis 04/16/2011  . Tobacco abuse 03/07/2011    Quit 01/2011   . Fracture of fifth toe, left, closed 01/28/2011    History of left fifth toe proximal phalanx fracture when patient had a stroke (01/2011) and fell. Seen by Dr Doran Durand.    . Substance abuse     h/o narcotic abuse pt denies as of 01/09/12  .  Myocardial infarct, old 2008    stents   Past Surgical History  Procedure Laterality Date  . Cardiac stent      2008   Family History  Problem Relation Age of Onset  . Heart disease Mother   . Diabetes Mother   . Heart disease Father   . Diabetes Father   . Heart disease Brother   . Diabetes Brother   . Heart disease Maternal Grandmother   . Heart disease Maternal Grandfather   . Heart disease Paternal Grandmother   . Heart disease Paternal Grandfather   . Lung cancer Brother   . Heart disease Brother    History  Substance Use Topics  . Smoking status: Current Every Day Smoker -- 0.50 packs/day for 30 years    Types: Cigarettes    Last Attempt to Quit: 10/18/2013  . Smokeless tobacco: Never Used     Comment: 1 pack/day  . Alcohol Use: No   OB History   Grav Para Term Preterm Abortions TAB SAB Ect Mult Living                 Review of Systems  Constitutional: Negative for activity change.  HENT: Negative for congestion.   Respiratory: Negative for cough and shortness of breath.   Cardiovascular: Negative for chest pain and leg swelling.  Gastrointestinal: Negative for nausea, vomiting, abdominal pain, diarrhea, constipation, blood in stool and abdominal distention.  Genitourinary: Negative  for dysuria, flank pain and vaginal discharge.  Musculoskeletal: Negative for back pain.  Skin: Negative for color change.  Neurological: Negative for syncope and headaches.  Psychiatric/Behavioral: Negative for agitation.      Allergies  Azor and Cozaar  Home Medications   Prior to Admission medications   Medication Sig Start Date End Date Taking? Authorizing Provider  allopurinol (ZYLOPRIM) 300 MG tablet Take 1 tablet (300 mg total) by mouth daily. 05/29/13   Blain Pais, MD  amLODipine (NORVASC) 10 MG tablet Take 1 tablet (10 mg total) by mouth daily. 11/08/13 11/08/14  Blain Pais, MD  atorvastatin (LIPITOR) 10 MG tablet Take 1 tablet (10 mg total) by  mouth daily at 6 PM. 12/19/13   Blain Pais, MD  clonazePAM (KLONOPIN) 0.5 MG tablet Take 1 mg by mouth 2 (two) times daily as needed for anxiety. 12/06/12   Bartholomew Crews, MD  clopidogrel (PLAVIX) 75 MG tablet TAKE 1 TABLET BY MOUTH ONCE DAILY    Blain Pais, MD  DULoxetine (CYMBALTA) 20 MG capsule Take 20 mg by mouth daily.    Historical Provider, MD  eszopiclone (LUNESTA) 2 MG TABS tablet TAKE 1 TABLET BY MOUTH EVERY NIGHT AT BEDTIME AS NEEDED    Blain Pais, MD  eszopiclone (LUNESTA) 2 MG TABS tablet Take 1 tablet (2 mg total) by mouth at bedtime as needed for sleep. Take immediately before bedtime 11/28/13   Othella Boyer, MD  HYDROcodone-acetaminophen (NORCO) 10-325 MG per tablet Take 1 tablet by mouth every 12 (twelve) hours as needed. 12/23/13   Duwaine Maxin, DO  metoprolol tartrate (LOPRESSOR) 25 MG tablet Take 1 tablet (25 mg total) by mouth 2 (two) times daily. 05/29/13 05/29/14  Blain Pais, MD   BP 149/88  Temp(Src) 98 F (36.7 C) (Oral)  Resp 27  SpO2 95% Physical Exam  Constitutional: She is oriented to person, place, and time. She appears well-developed.  HENT:  Head: Normocephalic.  Eyes:  Blind in right eye with non-reactive pupil   Neck: Neck supple.  Cardiovascular: Normal rate.  Exam reveals no gallop and no friction rub.   No murmur heard. Pulmonary/Chest: Effort normal and breath sounds normal. No respiratory distress.  Abdominal: Soft. She exhibits no distension. There is no tenderness. There is no rebound.  Musculoskeletal: She exhibits no edema.  Neurological: She is alert and oriented to person, place, and time.  Skin: Skin is warm.  Psychiatric: She has a normal mood and affect.    ED Course  Procedures (including critical care time) Labs Review Labs Reviewed  CBC WITH DIFFERENTIAL  COMPREHENSIVE METABOLIC PANEL  TROPONIN I  URINALYSIS, ROUTINE W REFLEX MICROSCOPIC  MAGNESIUM    Imaging Review No results found.    EKG Interpretation None      MDM   Final diagnoses:  Hypokalemia    53 y/o female with complicated medical history presents with hypokalemia from clinic. On repeat draw in ED patient noted to have K+ of 2.6, Mg 1.7, EKG- normal sinus rhythm  patient given 2 10U IV K+ and PO 40U repeat K+ 2.8. At this time it was determined that the patient would be admitted got hypokalemia, and replacement.    Claudean Severance, MD 12/24/13 2052

## 2013-12-23 NOTE — Assessment & Plan Note (Addendum)
Patient with occasional severe right eye pain s/p MRSA endophthalmitis and surgeries incluiding PPV.  She is being followed by [facility] and per Care Everywhere they would not prescribe the patient additional narcotic after last visit because they feel she is exhibiting drug seeking behavior.  The note indicates she was prescribed Vicodin #20 on 06/11 by [facility], however [place] [facility] database shows she was only prescribed #2.  Her PCP prescribed her #30 on 06/08 so if she is using the pills at least once daily she would be out by now.  She does report there are some days where she may need it twice and others where she does not need it.  Based on review of [place] [facility] database, her story seems consistent and she is likely out of pain medication at this time.  She has an appointment with Occuloplastics at [facility] on 07/23 so I will prescribe her Vicodin #30 until she can be seen for definitive treatment which will likely be enucleation.   - Vicodin 10/325 (1 tablet) q12h prn #30 (short-term) - continue eye drops OD  - follow-up with occuloplastics on 07/23

## 2013-12-24 ENCOUNTER — Other Ambulatory Visit: Payer: Self-pay

## 2013-12-24 DIAGNOSIS — H44009 Unspecified purulent endophthalmitis, unspecified eye: Secondary | ICD-10-CM

## 2013-12-24 DIAGNOSIS — I10 Essential (primary) hypertension: Secondary | ICD-10-CM | POA: Diagnosis not present

## 2013-12-24 DIAGNOSIS — Z8673 Personal history of transient ischemic attack (TIA), and cerebral infarction without residual deficits: Secondary | ICD-10-CM

## 2013-12-24 DIAGNOSIS — E785 Hyperlipidemia, unspecified: Secondary | ICD-10-CM

## 2013-12-24 DIAGNOSIS — I2510 Atherosclerotic heart disease of native coronary artery without angina pectoris: Secondary | ICD-10-CM

## 2013-12-24 DIAGNOSIS — F341 Dysthymic disorder: Secondary | ICD-10-CM

## 2013-12-24 DIAGNOSIS — E876 Hypokalemia: Secondary | ICD-10-CM | POA: Diagnosis not present

## 2013-12-24 LAB — BASIC METABOLIC PANEL
ANION GAP: 12 (ref 5–15)
ANION GAP: 12 (ref 5–15)
Anion gap: 12 (ref 5–15)
BUN: 11 mg/dL (ref 6–23)
BUN: 9 mg/dL (ref 6–23)
BUN: 9 mg/dL (ref 6–23)
CALCIUM: 8.4 mg/dL (ref 8.4–10.5)
CHLORIDE: 108 meq/L (ref 96–112)
CO2: 25 meq/L (ref 19–32)
CO2: 24 mEq/L (ref 19–32)
CO2: 25 meq/L (ref 19–32)
Calcium: 8.6 mg/dL (ref 8.4–10.5)
Calcium: 8.7 mg/dL (ref 8.4–10.5)
Chloride: 108 mEq/L (ref 96–112)
Chloride: 106 mEq/L (ref 96–112)
Creatinine, Ser: 0.52 mg/dL (ref 0.50–1.10)
Creatinine, Ser: 0.51 mg/dL (ref 0.50–1.10)
Creatinine, Ser: 0.58 mg/dL (ref 0.50–1.10)
GFR calc Af Amer: >90 mL/min (ref >90)
GFR calc Af Amer: >90 mL/min (ref >90)
GFR calc Af Amer: >90 mL/min (ref >90)
GFR calc non Af Amer: >90 mL/min (ref >90)
GFR calc non Af Amer: >90 mL/min (ref >90)
GLUCOSE: 100 mg/dL — AB (ref 70–99)
Glucose, Bld: 99 mg/dL (ref 70–99)
Glucose, Bld: 91 mg/dL (ref 70–99)
POTASSIUM: 3.3 meq/L — AB (ref 3.7–5.3)
POTASSIUM: 3.9 meq/L (ref 3.7–5.3)
Potassium: 3.4 mEq/L — ABNORMAL LOW (ref 3.7–5.3)
SODIUM: 145 meq/L (ref 137–147)
SODIUM: 144 meq/L (ref 137–147)
SODIUM: 143 meq/L (ref 137–147)

## 2013-12-24 LAB — MAGNESIUM: MAGNESIUM: 2 mg/dL (ref 1.5–2.5)

## 2013-12-24 LAB — MRSA PCR SCREENING: MRSA BY PCR: NEGATIVE

## 2013-12-24 MED ORDER — POTASSIUM CHLORIDE CRYS ER 20 MEQ PO TBCR
40.0000 meq | EXTENDED_RELEASE_TABLET | Freq: Once | ORAL | Status: AC
  Administered 2013-12-24: 40 meq via ORAL
  Filled 2013-12-24: qty 2

## 2013-12-24 MED ORDER — HYDROCODONE-ACETAMINOPHEN 5-325 MG PO TABS
1.0000 | ORAL_TABLET | Freq: Three times a day (TID) | ORAL | Status: DC | PRN
  Administered 2013-12-24 (×2): 2 via ORAL
  Filled 2013-12-24 (×2): qty 2

## 2013-12-24 MED ORDER — DORZOLAMIDE HCL-TIMOLOL MAL 2-0.5 % OP SOLN
1.0000 [drp] | Freq: Two times a day (BID) | OPHTHALMIC | Status: DC
  Administered 2013-12-24 (×2): 1 [drp] via OPHTHALMIC
  Filled 2013-12-24: qty 10

## 2013-12-24 MED ORDER — ALLOPURINOL 300 MG PO TABS
300.0000 mg | ORAL_TABLET | Freq: Every day | ORAL | Status: DC
  Administered 2013-12-24: 300 mg via ORAL
  Filled 2013-12-24: qty 1

## 2013-12-24 MED ORDER — ZOLPIDEM TARTRATE 5 MG PO TABS
5.0000 mg | ORAL_TABLET | Freq: Every evening | ORAL | Status: DC | PRN
  Administered 2013-12-24: 5 mg via ORAL
  Filled 2013-12-24: qty 1

## 2013-12-24 MED ORDER — ATROPINE SULFATE 1 % OP SOLN
1.0000 [drp] | Freq: Every day | OPHTHALMIC | Status: DC
  Administered 2013-12-24: 1 [drp] via OPHTHALMIC
  Filled 2013-12-24: qty 2

## 2013-12-24 MED ORDER — CLOPIDOGREL BISULFATE 75 MG PO TABS
75.0000 mg | ORAL_TABLET | Freq: Every day | ORAL | Status: DC
  Administered 2013-12-24: 75 mg via ORAL
  Filled 2013-12-24: qty 1

## 2013-12-24 MED ORDER — POTASSIUM CHLORIDE 10 MEQ/100ML IV SOLN
10.0000 meq | INTRAVENOUS | Status: AC
  Administered 2013-12-24 (×4): 10 meq via INTRAVENOUS
  Filled 2013-12-24 (×4): qty 100

## 2013-12-24 MED ORDER — DULOXETINE HCL 20 MG PO CPEP
20.0000 mg | ORAL_CAPSULE | Freq: Every day | ORAL | Status: DC
  Administered 2013-12-24: 20 mg via ORAL
  Filled 2013-12-24: qty 1

## 2013-12-24 MED ORDER — METOPROLOL TARTRATE 25 MG PO TABS
25.0000 mg | ORAL_TABLET | Freq: Two times a day (BID) | ORAL | Status: DC
  Administered 2013-12-24: 25 mg via ORAL
  Filled 2013-12-24 (×2): qty 1

## 2013-12-24 MED ORDER — POTASSIUM CHLORIDE ER 20 MEQ PO TBCR: 40.0000 meq | EXTENDED_RELEASE_TABLET | Freq: Every day | ORAL | Status: DC

## 2013-12-24 MED ORDER — MAGNESIUM SULFATE IN D5W 10-5 MG/ML-% IV SOLN
1.0000 g | Freq: Once | INTRAVENOUS | Status: AC
  Administered 2013-12-24: 1 g via INTRAVENOUS
  Filled 2013-12-24: qty 100

## 2013-12-24 MED ORDER — AMLODIPINE BESYLATE 10 MG PO TABS
10.0000 mg | ORAL_TABLET | Freq: Every day | ORAL | Status: DC
  Administered 2013-12-24: 10 mg via ORAL
  Filled 2013-12-24: qty 1

## 2013-12-24 MED ORDER — CLONAZEPAM 1 MG PO TABS
1.0000 mg | ORAL_TABLET | Freq: Two times a day (BID) | ORAL | Status: DC | PRN
  Administered 2013-12-24 (×2): 1 mg via ORAL
  Filled 2013-12-24 (×2): qty 1

## 2013-12-24 MED ORDER — ENOXAPARIN SODIUM 40 MG/0.4ML ~~LOC~~ SOLN
40.0000 mg | SUBCUTANEOUS | Status: DC
  Administered 2013-12-24: 40 mg via SUBCUTANEOUS
  Filled 2013-12-24: qty 0.4

## 2013-12-24 NOTE — Progress Notes (Signed)
Subjective: Sandra [name] is a 53 year old woman on hospital day 2 with a past medical history significant for endophthalmitis for which she was receiving antibiotics for 6 weeks, who presented to the ED with hypokalemia. On admission, she had a one week history of diarrhea. On admission her potassium was 2.5 and has been steadily improving. Her most recent potassium measurement at 10:40 AM was 3.4 after receiving a total of 130 mEq of potassium chloride (both IV and PO).   She has no new complaints since admission. She denies any shortness of breath, dizziness, tingling, weakness, myalgias, abdominal pain, shortness of breath, nausea, emesis, or dysuria. She has not had a bowel movement for the last twenty four hours. She is anxious to get her potassium up and go home.  Objective: Vital signs in last 24 hours: Filed Vitals:   12/23/13 2315 12/24/13 0000 12/24/13 0037 12/24/13 0558  BP: 135/79 138/76 147/76 142/80  Pulse: 62 66 66 64  Temp:   97.7 F (36.5 C) 98.3 F (36.8 C)  TempSrc:   Oral Oral  Resp:  16 18 18   Height:   5\' 5"  (1.651 m)   Weight:   87.998 kg (194 lb)   SpO2: 95% 97% 99% 91%   Weight change:   Intake/Output Summary (Last 24 hours) at 12/24/13 1327 Last data filed at 12/24/13 0700  Gross per 24 hour  Intake    960 ml  Output    200 ml  Net    760 ml   BP 142/80  Pulse 64  Temp(Src) 98.3 F (36.8 C) (Oral)  Resp 18  Ht 5\' 5"  (1.651 m)  Wt 87.998 kg (194 lb)  BMI 32.28 kg/m2  SpO2 91%  General: resting in bed HEENT: PERRL, moist mucus membranes Cardiac: RRR Pulm: clear to auscultation bilaterally, moving normal volumes of air Abd: soft, nontender, nondistended, BS present Ext: warm and well perfused, no pedal edema, 2+ dorsalis pedis pulses    Lab Results: @LABTEST2 @  Lab Results:  Basic Metabolic Panel:   Recent Labs   12/23/13 1803  12/23/13 2125  12/24/13 0655   NA  147  146  145   K  2.6*  2.8*  3.3*   CL  108  109  108   CO2  21  21   25    GLUCOSE  109*  161*  100*   BUN  11  10  11    CREATININE  0.54  0.54  0.52   CALCIUM  9.0  8.0*  8.6   MG  1.7  --  2.0    Liver Function Tests:   Recent Labs   12/23/13 1803   AST  13   ALT  12   ALKPHOS  84   BILITOT  <0.2*   PROT  6.8   ALBUMIN  3.8    No results found for this basename: LIPASE, AMYLASE, in the last 72 hours  No results found for this basename: AMMONIA, in the last 72 hours  CBC:   Recent Labs   12/23/13 1803   WBC  8.3   NEUTROABS  5.0   HGB  11.8*   HCT  34.6*   MCV  86.5   PLT  216    Cardiac Enzymes:   Recent Labs   12/23/13 1803   TROPONINI  <0.30    BNP:  No results found for this basename: PROBNP, in the last 72 hours  D-Dimer:  No results found  for this basename: DDIMER, in the last 72 hours  CBG:  No results found for this basename: GLUCAP, in the last 72 hours  Hemoglobin A1C:  No results found for this basename: HGBA1C, in the last 72 hours  Fasting Lipid Panel:  No results found for this basename: CHOL, HDL, LDLCALC, TRIG, CHOLHDL, LDLDIRECT, in the last 72 hours  Thyroid Function Tests:  No results found for this basename: TSH, T4TOTAL, FREET4, T3FREE, THYROIDAB, in the last 72 hours  Anemia Panel:  No results found for this basename: VITAMINB12, FOLATE, FERRITIN, TIBC, IRON, RETICCTPCT, in the last 72 hours  Coagulation:  No results found for this basename: LABPROT, INR, in the last 72 hours  Urine Drug Screen:  Drugs of Abuse    Component  Value  Date/Time    LABOPIA  NONE DETECTED  01/23/2012 [place]  01/23/2012 1843    LABBENZ  NONE DETECTED  01/23/2012 1843    AMPHETMU  NONE DETECTED  01/23/2012 1843    THCU  POSITIVE*  01/23/2012 1843    LABBARB  NONE DETECTED  01/23/2012 1843    Alcohol Level:  No results found for this basename: ETH, in the last 72 hours  Urinalysis:   Recent Labs   12/23/13 2009   COLORURINE  YELLOW   LABSPEC  1.022   PHURINE  6.0   GLUCOSEU  NEGATIVE   HGBUR   NEGATIVE   BILIRUBINUR  NEGATIVE   KETONESUR  NEGATIVE   PROTEINUR  NEGATIVE   UROBILINOGEN  0.2   NITRITE  POSITIVE*   LEUKOCYTESUR  SMALL*    EKG: 7/7 : unchanged from 7/6, no U waves   Micro Results: Recent Results (from the past 240 hour(s))  MRSA PCR SCREENING     Status: None   Collection Time    12/24/13 12:57 AM      Result Value Ref Range Status   MRSA by PCR NEGATIVE  NEGATIVE Final   Comment:            The GeneXpert MRSA Assay (FDA     approved for NASAL specimens     only), is one component of a     comprehensive MRSA colonization     surveillance program. It is not     intended to diagnose MRSA     infection nor to guide or     monitor treatment for     MRSA infections.   Studies/Results: No results found. Medications: I have reviewed the patient's current medications. Scheduled Meds: . allopurinol  300 mg Oral Daily  . amLODipine  10 mg Oral Daily  . atropine  1 drop Both Eyes Daily  . clopidogrel  75 mg Oral Daily  . dorzolamide-timolol  1 drop Right Eye BID  . DULoxetine  20 mg Oral Daily  . enoxaparin (LOVENOX) injection  40 mg Subcutaneous Q24H  . metoprolol tartrate  25 mg Oral BID  . potassium chloride  40 mEq Oral Once   Continuous Infusions:  PRN Meds:.clonazePAM, HYDROcodone-acetaminophen, zolpidem Assessment/Plan: Active Problems:   Hypokalemia  Hypokalemia: she has been asymptomatic during her hospital stay with an unchanged EKG. She has received 50 mEq IV KCl followed by 90 mEq of KDur which brought her K up to 3.4. Her magnesium was 1.7 and she received 1 g MgSO4 which brought it up to 2.   We are of the opinion that her hypokalemia is most likely secondary to fluid loss from  1 week hx of diarrhea.  We are also considered that she may be at risk of C. Diff following her 6 week course of antibiotics to treat enodphthalmitis.  Plan is to:  -administer 40 mEq KDur and then recheck K -pending those results, consider additional  supplementation -c diff pcr  Dispo: Disposition is deferred at this time, awaiting improvement of current medical problems.  Anticipated discharge in approximately 0-1 day(s).   The patient does have a current PCP (Blain Pais, MD) and does need an [facility] hospital follow-up appointment after discharge.  The patient does not know have transportation limitations that hinder transportation to clinic appointments.  .Services Needed at time of discharge: Y = Yes, Blank = No PT:   OT:   RN:   Equipment:   Other:     LOS: 1 day   Alvina Chou, Med Student 12/24/2013, 1:27 PM   I have seen and examined the patient myself, and I have reviewed the note by Joline Maxcy, MS3 and was present during the interview and physical exam.  Please see my separate H&P for additional findings, assessment, and plan.   Signed: Kelby Aline, MD 12/24/2013, 4:22 PM

## 2013-12-24 NOTE — Discharge Summary (Signed)
Name: Sandra [name] MRN: [id number] DOB: 10/19/1960 53 y.o. PCP: Blain Pais, MD  Date of Admission: 12/23/2013  5:33 PM Date of Discharge: 12/24/2013 Attending Physician: No att. providers found  Discharge Diagnosis:  Active Problems:   Hypokalemia  Discharge Medications:   Medication List         allopurinol 300 MG tablet  Commonly known as:  ZYLOPRIM  Take 1 tablet (300 mg total) by mouth daily.     amLODipine 10 MG tablet  Commonly known as:  NORVASC  Take 1 tablet (10 mg total) by mouth daily.     atropine 1 % ophthalmic solution  Place 1 drop into both eyes daily.     clonazePAM 0.5 MG tablet  Commonly known as:  KLONOPIN  Take 1 mg by mouth 2 (two) times daily as needed for anxiety.     clopidogrel 75 MG tablet  Commonly known as:  PLAVIX  Take 75 mg by mouth daily.     dorzolamide-timolol 22.3-6.8 MG/ML ophthalmic solution  Commonly known as:  COSOPT  Place 1 drop into the right eye 2 (two) times daily.     DULoxetine 20 MG capsule  Commonly known as:  CYMBALTA  Take 20 mg by mouth daily.     eszopiclone 2 MG Tabs tablet  Commonly known as:  LUNESTA  Take 1 tablet (2 mg total) by mouth at bedtime as needed for sleep. Take immediately before bedtime     LIPOFEN 50 MG Caps  Generic drug:  Fenofibrate  Take 50 mg by mouth daily.     metoprolol tartrate 25 MG tablet  Commonly known as:  LOPRESSOR  Take 1 tablet (25 mg total) by mouth 2 (two) times daily.     Potassium Chloride ER 20 MEQ Tbcr  Take 40 mEq by mouth daily.        Disposition and follow-up:   Sandra [name] was discharged from [facility] in Good condition.  At the hospital follow up visit please address:  1.  Whether to continue KDur supplementation  2.  Labs / imaging needed at time of follow-up:  basic metabolic panel   3.  Pending labs/ test needing follow-up: aldosterone + renin activity w ratio, urine culture  Follow-up Appointments: Follow-up  Information   Follow up with Duwaine Maxin, DO On 12/27/2013. (at 2:45 pm )    Specialty:  Internal Medicine   Contact information:   [address] [place] [address] [phone]       Discharge Instructions: Discharge Instructions   Diet - low sodium heart healthy    Complete by:  As directed      Increase activity slowly    Complete by:  As directed            Admission HPI: Sandra [name] is a 53 y.o. Female with PMHx of CAD and MI s/p stent in 2007, h/o MCA stroke in 2012, HTN, HLD, tobacco abuse, depression, and MRSA endophthalmitis who presented to the Internal Medicine clinic this afternoon. Pt was found to be hypokalemic at 2.5 on labs. Pt was sent to the ED for replacement. Pt does admit to non-bloody, non-odorous, non-watery diarrhea for 1 week. She has 2-3 bowel movements a day and has not tried anything for it. She denies any sick contacts. Denies any fever or chills. She has been taking all of her medications as prescribed. Pt admits that she has been under a lot of stress recently with her loss  of vision in her right eye. Pt was on antibiotics for 6 weeks for her endophthalmitis. Pt reports that she has had low potassium recently. A nurse who comes to her house recently drew labs reporting low K. She was sent a weeks supply of KCl and took it has prescribed. That was on 12/06/13. Pt denies any chest pain, shortness of breath, heart palpations, weakness, muscle cramps, nausea, vomiting, or HA.   Hospital Course by problem list: Active Problems:   Hypokalemia   #Hypokalemia secondary to fluid loss (diarrhea): Sandra [name] initially K of 2.5 in clinic and was sent to the [facility] ED for replacement. This was likely secondary to fluid loss in diarrhea. She reported a one week history of loose stools that came two weeks after completing a 6 week course of antibiotics for MRSA endophthalmitis. She had a c diff pcr ordered but did not have a bowel movement while admitted so this could  not be filled. C diff seems unlikely as the cause of diarrhea as she did not have a bowel movement for over a day and did not report a foul smell to the stool. She also had asymptomatic bacteriuria that may have caused or contributed to diarrhea. The patient was asymptomatic throughout her admission and an EKG did not have U waves. She received 70 mEq IV KCl followed by 120 mEq of KDur which elevated her K to 3.9 at the time of discharge. Her magnesium was 1.7 on presentation and she received 1 g MgSO4 which brought it up to 2.0. She was discharged with a prescription for KDur 40 mEq po daily and should have her potassium and magnesium rechecked as an outpatient.  #CAD s/p stent: Sandra [name] has a history of MI with stent placement in 2007 by Dr. Chancy Milroy at [facility]. EKG shows NSR with no ST or T wave changes. Troponins negative x 1. Patient denied ACS symptoms and was continued on her Plavix 75 mg daily, Metoprolol 25 mg BID, and Amlodipine 10 mg daily.   #Asymptomatic Bacteriuria: Patient had + nitrites and small amounts of leukocyte esterase on U/A with bacteria on microscopy. She is asymptomatic so UTI unlikely but a urine culture was done and should be followed up as outpatient.  #HTN: Blood pressure was in the 130s-150s/60s-70s. Patient was continued on Metoprolol 25 mg BID and Amlodipine 10 mg daily. An aldosterone and renin activity lab was collected and should be followed-up as outpatient  #Endophthalmitis: Patient completed 6 weeks of IV antibiotics for bilateral MRSA endophthalmitis at the end of June. She was continued on her cosopt ophthalmic solution 1 drop right eye BID and Atropine 1% ophthalmic solution 1 drop both eyes daily.  #HLD: Patient has a history of hyperlipidemia and was continued on fenofibrate 50 mg po daily.   #Hx of Gout: Patient was asymptomatic and continude on her allopurinol 300 mg po daily.  #Depression/Anxiety: Patient was in pleasant mood throughout  hospitalization and was continued on cymbalta 20 mg daily, ambien 5 mg prn, and klonipin 1 mg bidprn.   Discharge Vitals:   BP 151/85  Pulse 69  Temp(Src) 97.9 F (36.6 C) (Oral)  Resp 18  Ht 5\' 5"  (1.651 m)  Wt 194 lb (87.998 kg)  BMI 32.28 kg/m2  SpO2 98%  Discharge Labs:  Results for orders placed during the hospital encounter of 12/23/13 (from the past 24 hour(s))  CBC WITH DIFFERENTIAL     Status: Abnormal   Collection Time    12/23/13  6:03 PM      Result Value Ref Range   WBC 8.3  4.0 - 10.5 K/uL   RBC 4.00  3.87 - 5.11 MIL/uL   Hemoglobin 11.8 (*) 12.0 - 15.0 g/dL   HCT 34.6 (*) 36.0 - 46.0 %   MCV 86.5  78.0 - 100.0 fL   MCH 29.5  26.0 - 34.0 pg   MCHC 34.1  30.0 - 36.0 g/dL   RDW 14.4  11.5 - 15.5 %   Platelets 216  150 - 400 K/uL   Neutrophils Relative % 60  43 - 77 %   Neutro Abs 5.0  1.7 - 7.7 K/uL   Lymphocytes Relative 29  12 - 46 %   Lymphs Abs 2.4  0.7 - 4.0 K/uL   Monocytes Relative 5  3 - 12 %   Monocytes Absolute 0.4  0.1 - 1.0 K/uL   Eosinophils Relative 6 (*) 0 - 5 %   Eosinophils Absolute 0.5  0.0 - 0.7 K/uL   Basophils Relative 0  0 - 1 %   Basophils Absolute 0.0  0.0 - 0.1 K/uL  COMPREHENSIVE METABOLIC PANEL     Status: Abnormal   Collection Time    12/23/13  6:03 PM      Result Value Ref Range   Sodium 147  137 - 147 mEq/L   Potassium 2.6 (*) 3.7 - 5.3 mEq/L   Chloride 108  96 - 112 mEq/L   CO2 21  19 - 32 mEq/L   Glucose, Bld 109 (*) 70 - 99 mg/dL   BUN 11  6 - 23 mg/dL   Creatinine, Ser 0.54  0.50 - 1.10 mg/dL   Calcium 9.0  8.4 - 10.5 mg/dL   Total Protein 6.8  6.0 - 8.3 g/dL   Albumin 3.8  3.5 - 5.2 g/dL   AST 13  0 - 37 U/L   ALT 12  0 - 35 U/L   Alkaline Phosphatase 84  39 - 117 U/L   Total Bilirubin <0.2 (*) 0.3 - 1.2 mg/dL   GFR calc non Af Amer >90  >90 mL/min   GFR calc Af Amer >90  >90 mL/min   Anion gap 18 (*) 5 - 15  TROPONIN I     Status: None   Collection Time    12/23/13  6:03 PM      Result Value Ref Range    Troponin I <0.30  <0.30 ng/mL  MAGNESIUM     Status: None   Collection Time    12/23/13  6:03 PM      Result Value Ref Range   Magnesium 1.7  1.5 - 2.5 mg/dL  URINALYSIS, ROUTINE W REFLEX MICROSCOPIC     Status: Abnormal   Collection Time    12/23/13  8:09 PM      Result Value Ref Range   Color, Urine YELLOW  YELLOW   APPearance CLOUDY (*) CLEAR   Specific Gravity, Urine 1.022  1.005 - 1.030   pH 6.0  5.0 - 8.0   Glucose, UA NEGATIVE  NEGATIVE mg/dL   Hgb urine dipstick NEGATIVE  NEGATIVE   Bilirubin Urine NEGATIVE  NEGATIVE   Ketones, ur NEGATIVE  NEGATIVE mg/dL   Protein, ur NEGATIVE  NEGATIVE mg/dL   Urobilinogen, UA 0.2  0.0 - 1.0 mg/dL   Nitrite POSITIVE (*) NEGATIVE   Leukocytes, UA SMALL (*) NEGATIVE  URINE MICROSCOPIC-ADD ON     Status: Abnormal   Collection Time  12/23/13  8:09 PM      Result Value Ref Range   Squamous Epithelial / LPF FEW (*) RARE   WBC, UA 11-20  <3 WBC/hpf   Bacteria, UA MANY (*) RARE  BASIC METABOLIC PANEL     Status: Abnormal   Collection Time    12/23/13  9:25 PM      Result Value Ref Range   Sodium 146  137 - 147 mEq/L   Potassium 2.8 (*) 3.7 - 5.3 mEq/L   Chloride 109  96 - 112 mEq/L   CO2 21  19 - 32 mEq/L   Glucose, Bld 161 (*) 70 - 99 mg/dL   BUN 10  6 - 23 mg/dL   Creatinine, Ser 0.54  0.50 - 1.10 mg/dL   Calcium 8.0 (*) 8.4 - 10.5 mg/dL   GFR calc non Af Amer >90  >90 mL/min   GFR calc Af Amer >90  >90 mL/min   Anion gap 16 (*) 5 - 15  MRSA PCR SCREENING     Status: None   Collection Time    12/24/13 12:57 AM      Result Value Ref Range   MRSA by PCR NEGATIVE  NEGATIVE  BASIC METABOLIC PANEL     Status: Abnormal   Collection Time    12/24/13  6:55 AM      Result Value Ref Range   Sodium 145  137 - 147 mEq/L   Potassium 3.3 (*) 3.7 - 5.3 mEq/L   Chloride 108  96 - 112 mEq/L   CO2 25  19 - 32 mEq/L   Glucose, Bld 100 (*) 70 - 99 mg/dL   BUN 11  6 - 23 mg/dL   Creatinine, Ser 0.52  0.50 - 1.10 mg/dL   Calcium 8.6  8.4 -  10.5 mg/dL   GFR calc non Af Amer >90  >90 mL/min   GFR calc Af Amer >90  >90 mL/min   Anion gap 12  5 - 15  MAGNESIUM     Status: None   Collection Time    12/24/13  6:55 AM      Result Value Ref Range   Magnesium 2.0  1.5 - 2.5 mg/dL  BASIC METABOLIC PANEL     Status: Abnormal   Collection Time    12/24/13 10:40 AM      Result Value Ref Range   Sodium 144  137 - 147 mEq/L   Potassium 3.4 (*) 3.7 - 5.3 mEq/L   Chloride 108  96 - 112 mEq/L   CO2 24  19 - 32 mEq/L   Glucose, Bld 99  70 - 99 mg/dL   BUN 9  6 - 23 mg/dL   Creatinine, Ser 0.51  0.50 - 1.10 mg/dL   Calcium 8.4  8.4 - 10.5 mg/dL   GFR calc non Af Amer >90  >90 mL/min   GFR calc Af Amer >90  >90 mL/min   Anion gap 12  5 - 15  BASIC METABOLIC PANEL     Status: None   Collection Time    12/24/13  4:10 PM      Result Value Ref Range   Sodium 143  137 - 147 mEq/L   Potassium 3.9  3.7 - 5.3 mEq/L   Chloride 106  96 - 112 mEq/L   CO2 25  19 - 32 mEq/L   Glucose, Bld 91  70 - 99 mg/dL   BUN 9  6 - 23 mg/dL  Creatinine, Ser 0.58  0.50 - 1.10 mg/dL   Calcium 8.7  8.4 - 10.5 mg/dL   GFR calc non Af Amer >90  >90 mL/min   GFR calc Af Amer >90  >90 mL/min   Anion gap 12  5 - 15   Discharge Physical Exam: General Appearance:  Alert, cooperative, no distress, appears stated age   31:  Normocephalic,R eye closed with minimal retraction of eyelid. L eye reactive to light. Moist mucous membranes   Back:  Symmetric, no curvature, ROM normal, no CVA tenderness   Lungs:  Clear to auscultation bilaterally, respirations unlabored   Heart:  Regular rate and rhythm, S1 and S2 normal, no murmur, rub or gallop   Abdomen:  Soft, non-tender in all four quadrants and suprapubic, bowel sounds active , no masses, no organomegaly   Extremities:  Extremities normal, atraumatic, no cyanosis or edema   Pulses:  2+ and symmetric radial and DP pulses     Signed: Kelby Aline, MD 12/24/2013, 5:36 PM    Services Ordered on Discharge:  none Equipment Ordered on Discharge: none

## 2013-12-24 NOTE — Progress Notes (Signed)
Subjective: Since admission Sandra [name] has no complaints. She has not had a bowel movement in a day but notes that for two weeks prior they were loose and called it diarrhea. She is anxious to leave. She denies any chest pain, palpitations, SOB, dizziness, weakness, myalgias, abdominal pain, suprapubic pain, nausea, emesis, or dysuria. Objective: Vital signs in last 24 hours: Filed Vitals:   12/23/13 2315 12/24/13 0000 12/24/13 0037 12/24/13 0558  BP: 135/79 138/76 147/76 142/80  Pulse: 62 66 66 64  Temp:   97.7 F (36.5 C) 98.3 F (36.8 C)  TempSrc:   Oral Oral  Resp:  16 18 18   Height:   5\' 5"  (1.651 m)   Weight:   194 lb (87.998 kg)   SpO2: 95% 97% 99% 91%   Weight change:   Intake/Output Summary (Last 24 hours) at 12/24/13 0953 Last data filed at 12/24/13 0700  Gross per 24 hour  Intake    960 ml  Output    200 ml  Net    760 ml   BP 142/80  Pulse 64  Temp(Src) 98.3 F (36.8 C) (Oral)  Resp 18  Ht 5\' 5"  (1.651 m)  Wt 194 lb (87.998 kg)  BMI 32.28 kg/m2  SpO2 91%  General Appearance:    Alert, cooperative, no distress, appears stated age  HEENT:    Normocephalic,R eye closed with minimal retraction of eyelid. L eye reactive to light. Moist mucous membranes  Back:     Symmetric, no curvature, ROM normal, no CVA tenderness  Lungs:     Clear to auscultation bilaterally, respirations unlabored   Heart:    Regular rate and rhythm, S1 and S2 normal, no murmur, rub   or gallop  Abdomen:     Soft, non-tender in all four quadrants and suprapubic, bowel sounds active , no masses, no organomegaly  Extremities:   Extremities normal, atraumatic, no cyanosis or edema  Pulses:   2+ and symmetric radial and DP pulses   Lab Results: Basic Metabolic Panel:  Recent Labs  12/23/13 1803 12/23/13 2125 12/24/13 0655  NA 147 146 145  K 2.6* 2.8* 3.3*  CL 108 109 108  CO2 21 21 25   GLUCOSE 109* 161* 100*  BUN 11 10 11   CREATININE 0.54 0.54 0.52  CALCIUM 9.0 8.0* 8.6  MG 1.7   --  2.0   Liver Function Tests:  Recent Labs  12/23/13 1803  AST 13  ALT 12  ALKPHOS 84  BILITOT <0.2*  PROT 6.8  ALBUMIN 3.8   No results found for this basename: LIPASE, AMYLASE,  in the last 72 hours No results found for this basename: AMMONIA,  in the last 72 hours CBC:  Recent Labs  12/23/13 1803  WBC 8.3  NEUTROABS 5.0  HGB 11.8*  HCT 34.6*  MCV 86.5  PLT 216   Cardiac Enzymes:  Recent Labs  12/23/13 1803  TROPONINI <0.30   BNP: No results found for this basename: PROBNP,  in the last 72 hours D-Dimer: No results found for this basename: DDIMER,  in the last 72 hours CBG: No results found for this basename: GLUCAP,  in the last 72 hours Hemoglobin A1C: No results found for this basename: HGBA1C,  in the last 72 hours Fasting Lipid Panel: No results found for this basename: CHOL, HDL, LDLCALC, TRIG, CHOLHDL, LDLDIRECT,  in the last 72 hours Thyroid Function Tests: No results found for this basename: TSH, T4TOTAL, FREET4, T3FREE, THYROIDAB,  in the  last 72 hours Anemia Panel: No results found for this basename: VITAMINB12, FOLATE, FERRITIN, TIBC, IRON, RETICCTPCT,  in the last 72 hours Coagulation: No results found for this basename: LABPROT, INR,  in the last 72 hours Urine Drug Screen: Drugs of Abuse     Component Value Date/Time   LABOPIA NONE DETECTED 01/23/2012 [place] 01/23/2012 1843   LABBENZ NONE DETECTED 01/23/2012 1843   AMPHETMU NONE DETECTED 01/23/2012 1843   THCU POSITIVE* 01/23/2012 1843   LABBARB NONE DETECTED 01/23/2012 1843    Alcohol Level: No results found for this basename: ETH,  in the last 72 hours Urinalysis:  Recent Labs  12/23/13 2009  [place] 1.022  PHURINE 6.0  GLUCOSEU NEGATIVE  HGBUR NEGATIVE  BILIRUBINUR NEGATIVE  KETONESUR NEGATIVE  PROTEINUR NEGATIVE  UROBILINOGEN 0.2  NITRITE POSITIVE*  LEUKOCYTESUR SMALL*   EKG: 7/7 : unchanged from 7/6, no U waves  Micro  Results: Recent Results (from the past 240 hour(s))  MRSA PCR SCREENING     Status: None   Collection Time    12/24/13 12:57 AM      Result Value Ref Range Status   MRSA by PCR NEGATIVE  NEGATIVE Final   Comment:            The GeneXpert MRSA Assay (FDA     approved for NASAL specimens     only), is one component of a     comprehensive MRSA colonization     surveillance program. It is not     intended to diagnose MRSA     infection nor to guide or     monitor treatment for     MRSA infections.   Studies/Results: No results found. Medications: I have reviewed the patient's current medications. Scheduled Meds: . allopurinol  300 mg Oral Daily  . amLODipine  10 mg Oral Daily  . atropine  1 drop Both Eyes Daily  . clopidogrel  75 mg Oral Daily  . dorzolamide-timolol  1 drop Right Eye BID  . DULoxetine  20 mg Oral Daily  . enoxaparin (LOVENOX) injection  40 mg Subcutaneous Q24H  . metoprolol tartrate  25 mg Oral BID   Continuous Infusions:  PRN Meds:.clonazePAM, HYDROcodone-acetaminophen, zolpidem Assessment/Plan: Active Problems:   Hypokalemia  Hypokalemia: Patient has been symptomatic since admission. EKG unchanged. She received 50 mEq IV KCl followed by 80 mEq of KDur which elevated her K to 3.4. Her magnesium was 1.7 and she received 1 g MgSO4 which brought it up to 2. The hypokalemia is likely secondary to fluid lose from bowel movements. She is at risk of C diff from her prolonged (6 week) antibiotic exposure to treat endophthalmitis. She has not had a bowel movement since admission which has limited C diff testing.  She also has signs of asymptomatic bacteremia would could have caused the diarrhea.  - Additional 40 mEq KDur and recheck K - Consider additional K supplementation pending K recheck - c diff pcr - heart diet  CAD s/p stent: Pt has history of MI with stent placement in 2007 by Dr. Chancy Milroy at [facility]. EKG shows NSR. No ST or T wave changes. Troponins  negative x 1. Pt denies any chest pain, shortness of breath or chest tightness. Pt takes Plavix 75 mg daily, Metoprolol 25 mg BID, and Amlodipine 10 mg daily at home. Pt continues to smoke.  - Continue Plavix 75 mg daily, Metoprolol 25 mg BID, Amlodipine 10 mg daily  HTN: BP has been in the 130s-150s/60s-70s. Pt is on Metoprolol 25 mg BID and Amlodipine 10 mg daily at home.  - Continue Metoprolol 25 mg BID, Amlodipine 10 mg daily   HLD: Hx of HLD. On Plavix 75 mg daily.  - Plavix 75 mg daily   Hx of CVA: Pt had an acute R MCA stroke with left arm and leg weakness in August 2012. - Continue Plavix 75 mg daily, Metoprolol 25 mg BID, Amlodipine 10 mg daily   Hx of Gout: Asymptomatic -Continue allopurinol 300 mg daily.   Depression/Anxiety: Patient in good spirits but ready to go home - Continue Cymbalta 20 mg daily, Ambien 5 mg prn insomnia, Klonopin 1mg  BID prn anxiety     Dispo: Disposition is deferred at this time, awaiting improvement of current medical problems.  Anticipated discharge in approximately 0-1 day(s).   The patient does have a current PCP (Blain Pais, MD) and does need an [facility] hospital follow-up appointment after discharge.  The patient does not know have transportation limitations that hinder transportation to clinic appointments.  .Services Needed at time of discharge: Y = Yes, Blank = No PT:   OT:   RN:   Equipment:   Other:     LOS: 1 day   Kelby Aline, MD 12/24/2013, 9:53 AM

## 2013-12-24 NOTE — Progress Notes (Signed)
Case discussed with Dr. Wilson soon after the resident saw the patient. We reviewed the resident's history and exam and pertinent patient test results. I agree with the assessment, diagnosis, and plan of care documented in the resident's note. 

## 2013-12-24 NOTE — H&P (Signed)
Attending physician admission note: Patient interviewed and examined. Medical history, physical findings, pertinent lab data, problem list and management plan I treated as recorded above by resident physician Dr. Osa Craver. Clinical summary: 53 year old woman with a history of hypertension, hyperlipidemia, underlying coronary artery disease status post MI status post coronary stent in 2007, cerebrovascular disease with history of a middle cerebral artery stroke in 2012. 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 She has had a number of surgeries in [facility]. She has had some intermittent loose bowel movements. She was referred to the emergency department when outpatient blood work showed a potassium level of 2.5. Of note she is not on any diuretics. She is clinically asymptomatic. Current exam: Blood pressure 151/85, pulse 69, temperature 97.9 F (36.6 C), temperature source Oral, resp. rate 18, height 5' 5"  (1.651 m), weight 194 lb (87.998 kg), SpO2 98.00%. Pertinent findings limited to the right eye. The pupil is wide, irregular, and not reactive to light. Left pupil is round and reactive. Abdomen is soft, nontender, no mass, no organomegaly. Impression: Hypokalemia. Etiology unclear. Possible electrolyte losses related to recent low-grade diarrhea. As stated, she is not on diuretics. No evidence for renal insufficiency. No anion gap acidosis. Plan: We will replace potassium both orally and parenterally.

## 2013-12-24 NOTE — Assessment & Plan Note (Signed)
Addendum:  BMP obtained to assess renal function at 12/24/13 visit revealed K of 2.5.  Patient had already left the clinic but was telephoned by clinic nurse to return to [facility] ED for repeat lab, EKG and likely admission for IV K supplementation and hypokalemia work-up.

## 2013-12-24 NOTE — ED Notes (Signed)
Admitting MD at bedside.

## 2013-12-24 NOTE — ED Notes (Signed)
Attempted report 

## 2013-12-24 NOTE — Discharge Summary (Signed)
Attending physician discharge note: I personally interviewed and examined this patient. History, physical findings, laboratory data, management plan, accurate as recorded by resident physic Dr. Lottie Mussel. The patient was admitted with a symptomatic hypokalemia. No acute electrocardiogram changes. No abnormal physical findings. Recent loose bowel movements which had stopped by time of admission. She is not on any diuretics. Potassium normalized rapidly with oral and parenteral replacement. Condition stable at time of discharge.

## 2013-12-24 NOTE — Progress Notes (Signed)
Went over discharge instructions with patient. IV discontinued, patient taken off cardiac monitor. Patient eager to leave. Discharged home with husband. Sandra [name]

## 2013-12-24 NOTE — Care Management Note (Unsigned)
    Page 1 of 1   12/24/2013     3:53:34 PM CARE MANAGEMENT NOTE 12/24/2013  Patient:  Sandra [name],Sandra [name]   Account Number:  [id number]  Date Initiated:  12/24/2013  Documentation initiated by:  Jhada Risk  Subjective/Objective Assessment:   Pt adm on 12/23/13 with hypokalemia.  PTA, pt resides at home with spouse.     Action/Plan:   Will follow for dc needs as pt progresses.   Anticipated DC Date:  12/25/2013   Anticipated DC Plan:  [place]  CM consult      Choice offered to / List presented to:             Status of service:  In process, will continue to follow Medicare Important Message given?   (If response is "NO", the following Medicare IM given date fields will be blank) Date Medicare IM given:   Medicare IM given by:   Date Additional Medicare IM given:   Additional Medicare IM given by:    Discharge Disposition:    Per UR Regulation:  Reviewed for med. necessity/level of care/duration of stay  If discussed at [place] of Stay Meetings, dates discussed:    Comments:

## 2013-12-24 NOTE — Progress Notes (Signed)
Utilization Review Completed.[name] T7/12/2013

## 2013-12-25 NOTE — ED Provider Notes (Signed)
I saw and evaluated the patient, reviewed the resident's note and I agree with the findings and plan. If applicable, I agree with the resident's interpretation of the EKG.  If applicable, I was present for critical portions of any procedures performed.  Persistent hypokalemia in clinic and ED.  No EKG changes. intermitent diarrhea at home.   Ezequiel Essex, MD 12/25/13 [phone]

## 2013-12-27 ENCOUNTER — Ambulatory Visit: Payer: Medicare Other | Admitting: Internal Medicine

## 2013-12-27 LAB — URINE CULTURE: Colony Count: >=100000

## 2013-12-29 LAB — ALDOSTERONE + RENIN ACTIVITY W/ RATIO
ALDO / PRA RATIO: 5.9 ratio (ref 0.9–28.9)
ALDOSTERONE: 1 ng/dL
PRA LC/MS/MS: 0.17 ng/mL/h — AB (ref 0.25–5.82)

## 2013-12-30 ENCOUNTER — Ambulatory Visit: Payer: Medicare Other | Admitting: Internal Medicine

## 2013-12-31 ENCOUNTER — Ambulatory Visit (INDEPENDENT_AMBULATORY_CARE_PROVIDER_SITE_OTHER): Payer: Medicare HMO | Admitting: Cardiovascular Disease

## 2013-12-31 ENCOUNTER — Encounter: Payer: Self-pay | Admitting: Cardiovascular Disease

## 2013-12-31 VITALS — BP 140/70 | HR 62 | Ht 65.0 in | Wt 197.0 lb

## 2013-12-31 DIAGNOSIS — I10 Essential (primary) hypertension: Secondary | ICD-10-CM | POA: Diagnosis not present

## 2013-12-31 DIAGNOSIS — E785 Hyperlipidemia, unspecified: Secondary | ICD-10-CM | POA: Diagnosis not present

## 2013-12-31 NOTE — Progress Notes (Signed)
12/31/2013 Sandra [name]   1960-07-17  [id number]  Primary Physician Blain Pais, MD Primary Cardiologist: Lorretta Harp MD Sandra [name]   HPI:  Sandra [name] is a 53 year old moderately overweight married Caucasian female mother of one child who is disabled and referred by [name] outpatient clinic for establishment in our practice.her cardiac risk factor profile is positive for tobacco abuse having smoked one to 2 packs a day for last 35 years currently smoking one half pack per day. She has treated hypertension and hyperlipidemia. She had stenting of her circumflex back in 2008 by Dr. Josefa Half using a bare-metal stent.she's also had a stroke in the past.   Current Outpatient Prescriptions  Medication Sig Dispense Refill  . allopurinol (ZYLOPRIM) 300 MG tablet Take 1 tablet (300 mg total) by mouth daily.  30 tablet  11  . amLODipine (NORVASC) 10 MG tablet Take 1 tablet (10 mg total) by mouth daily.  30 tablet  11  . atorvastatin (LIPITOR) 10 MG tablet Take 10 mg by mouth daily at 6 PM.       . atropine 1 % ophthalmic solution Place 1 drop into both eyes daily.      . clonazePAM (KLONOPIN) 0.5 MG tablet Take 1 mg by mouth 2 (two) times daily as needed for anxiety.      . clopidogrel (PLAVIX) 75 MG tablet Take 75 mg by mouth daily.      . eszopiclone (LUNESTA) 2 MG TABS tablet Take 1 tablet (2 mg total) by mouth at bedtime as needed for sleep. Take immediately before bedtime  10 tablet  0  . Fenofibrate (LIPOFEN) 50 MG CAPS Take 50 mg by mouth daily.      . metoprolol tartrate (LOPRESSOR) 25 MG tablet Take 1 tablet (25 mg total) by mouth 2 (two) times daily.  60 tablet  11  . potassium chloride 20 MEQ TBCR Take 40 mEq by mouth daily.  60 tablet  0  . prednisoLONE acetate (PRED FORTE) 1 % ophthalmic suspension 1 drop 4 (four) times daily.       No current facility-administered medications for this visit.    Allergies  Allergen Reactions  . Azor  [Amlodipine-Olmesartan] Other (See Comments)    Drops blood pressure too low (takes plain amlodipine at home July 2015)  . Cozaar [Losartan Potassium]     History   Social History  . Marital Status: Married    Spouse Name: N/A    Number of Children: 1  . Years of Education: N/A   Occupational History  . Disabled    Social History Main Topics  . Smoking status: Current Every Day Smoker -- 0.50 packs/day for 30 years    Types: Cigarettes    Last Attempt to Quit: 10/18/2013  . Smokeless tobacco: Never Used     Comment: 1 pack/day  . Alcohol Use: No  . Drug Use: No  . Sexual Activity: Not on file   Other Topics Concern  . Not on file   Social History Narrative   Pt lives at home with sister and brother pt quit smoking 01/2011 pt denies drinking or using any illicit  drugs. Pt is a high school grad.     Review of Systems: General: negative for chills, fever, night sweats or weight changes.  Cardiovascular: negative for chest pain, dyspnea on exertion, edema, orthopnea, palpitations, paroxysmal nocturnal dyspnea or shortness of breath Dermatological: negative for rash Respiratory: negative for cough or wheezing Urologic: negative for  hematuria Abdominal: negative for nausea, vomiting, diarrhea, bright red blood per rectum, melena, or hematemesis Neurologic: negative for visual changes, syncope, or dizziness All other systems reviewed and are otherwise negative except as noted above.    Blood pressure 140/70, pulse 62, height 5' 5"  (1.651 m), weight 197 lb (89.359 kg).  General appearance: alert and no distress Neck: no adenopathy, no carotid bruit, no JVD, supple, symmetrical, trachea midline and thyroid not enlarged, symmetric, no tenderness/mass/nodules Lungs: clear to auscultation bilaterally Heart: regular rate and rhythm, S1, S2 normal, no murmur, click, rub or gallop Extremities: extremities normal, atraumatic, no cyanosis or edema and palpable pedal pulses  EKG not  performed today  ASSESSMENT AND PLAN:   Hypertension Controlled on current medications  Hyperlipidemia On statin therapy followed by her PCP  CAD (coronary artery disease) History of CAD status post stenting in 2008. She had a 3 mm x 18 mm vision bare-metal stent placed by Dr. Josefa Half 10/16/06. She denies chest pain or shortness of breath.      Lorretta Harp MD FACP,FACC,FAHA, [facility] 12/31/2013 12:55 PM

## 2013-12-31 NOTE — Assessment & Plan Note (Signed)
On statin therapy followed by her PCP 

## 2013-12-31 NOTE — Patient Instructions (Signed)
Dr Gwenlyn Found will see you back in 1 year.

## 2013-12-31 NOTE — Assessment & Plan Note (Signed)
History of CAD status post stenting in 2008. She had a 3 mm x 18 mm vision bare-metal stent placed by Dr. Josefa Half 10/16/06. She denies chest pain or shortness of breath.

## 2013-12-31 NOTE — Assessment & Plan Note (Signed)
Controlled on current medications 

## 2014-01-03 ENCOUNTER — Ambulatory Visit (INDEPENDENT_AMBULATORY_CARE_PROVIDER_SITE_OTHER): Payer: Medicare HMO | Admitting: Internal Medicine

## 2014-01-03 ENCOUNTER — Encounter: Payer: Self-pay | Admitting: Internal Medicine

## 2014-01-03 ENCOUNTER — Telehealth: Payer: Self-pay | Admitting: Internal Medicine

## 2014-01-03 ENCOUNTER — Other Ambulatory Visit: Payer: Self-pay | Admitting: Internal Medicine

## 2014-01-03 VITALS — BP 131/82 | HR 61 | Temp 98.0°F | Ht 65.0 in | Wt 198.3 lb

## 2014-01-03 DIAGNOSIS — E876 Hypokalemia: Secondary | ICD-10-CM

## 2014-01-03 DIAGNOSIS — H44009 Unspecified purulent endophthalmitis, unspecified eye: Secondary | ICD-10-CM

## 2014-01-03 DIAGNOSIS — H44003 Unspecified purulent endophthalmitis, bilateral: Secondary | ICD-10-CM

## 2014-01-03 LAB — BASIC METABOLIC PANEL WITH GFR
BUN: 12 mg/dL (ref 6–23)
CHLORIDE: 103 meq/L (ref 96–112)
CO2: 23 mEq/L (ref 19–32)
Calcium: 9 mg/dL (ref 8.4–10.5)
Creat: 0.6 mg/dL (ref 0.50–1.10)
GFR, Est African American: >89 mL/min
Glucose, Bld: 79 mg/dL (ref 70–99)
POTASSIUM: 5.2 meq/L (ref 3.5–5.3)
SODIUM: 140 meq/L (ref 135–145)

## 2014-01-03 LAB — MAGNESIUM: MAGNESIUM: 1.9 mg/dL (ref 1.5–2.5)

## 2014-01-03 NOTE — Patient Instructions (Signed)
1. I will call you if there are problems with your labs.  Please keep your appointment with [facility] occuloplastics next week.   2. Please take all medications as prescribed.    3. If you have worsening of your symptoms or new symptoms arise, please call the clinic ([phone]), or go to the ER immediately if symptoms are severe.

## 2014-01-03 NOTE — Assessment & Plan Note (Signed)
Her exam has not changed since I last saw her.  At that time I told her I would prescribe Vicodin #30 to last her until she sees occuloplastics at [facility] on 07/23.  The patient and her husband report that he has started to double up her Vicodin because she has been in more pain since falling into the bushes the other day.  She tells me she still has some pills left (about #7).  I have advised her that she should only take 1 every 12 hours if needed and she must make this last until she sees doctors at [facility] who can decide on definitive treatment for pain.

## 2014-01-03 NOTE — Telephone Encounter (Signed)
I called [name] regarding her lab results.  Her husband informed me she was resting.  I asked him to have the patient stop taking potassium supplementation (labs high end of normal with supplementation).  I have asked him to schedule an appointment for her to return to clinic to see PCP for BMP and follow-up in early August (2 weeks).

## 2014-01-03 NOTE — Assessment & Plan Note (Addendum)
Will check BMP and Mg today.  If K high normal can d/c Kdur.  Aldo/renin normal, however both values appear low.  No c/w primary or secondary hyperaldosteronism.  Will defer to PCP for further work-up of low aldo/low renin.

## 2014-01-03 NOTE — Progress Notes (Signed)
   Subjective:    [name], female    DOB: 12-20-1960, 53 y.o.   MRN: [id number]  HPI Comments: Sandra [name] is a 53 year old woman with a PMH of CAD, HTN, PFO, OA and recent MRSA + endophthalmitis OU with NLP in OD trx with IV antibiotics x 6 weeks, s/p pars plana vitrectomy OD.  She was seen in [facility] 2 weeks ago for eye pain.  Blood work at that visit revealed potassium 2.5 and she was subsequently admitted to hospital for hypokalemia.  She was supplemented with K and Mg and discharged in stable condition.  Unclear etiology of low K, possible 2/2 to diarrhea in days prior to admission.  She is here for follow-up.  She is taking 22mEq of Kdur daily.  No  Fevers/chills, dysuria, frequency, appetite change, N/V, dizziness or syncope.  She is requesting more pain medication for her eye.  She says she lost balance and fell into bushes while transferring from wheelchair on her way to Cardiology appointment 3 days ago.  She denies hitting her head on hard surface but says the right side of her face landed in the bushes and she feels like she has more eye pain since then.       Review of Systems  Constitutional: Negative for fever, chills and appetite change.  Eyes: Positive for pain.  Respiratory: Negative for shortness of breath.   Cardiovascular: Negative for chest pain, palpitations and leg swelling.  Gastrointestinal: Negative for nausea, vomiting, diarrhea, constipation and blood in stool.  Genitourinary: Negative for dysuria, frequency and hematuria.  Neurological: Negative for dizziness and syncope.       Objective:   Physical Exam  Vitals reviewed. Constitutional: She is oriented to person, place, and time. She appears well-developed. No distress.  HENT:  Head: Normocephalic and atraumatic.  Mouth/Throat: Oropharynx is clear and moist. No oropharyngeal exudate.  Eyes: Right eye exhibits no discharge. Left eye exhibits no discharge.  Left eye PERRL, EOMI Right eye partially  closed, post-op changes, no lacerations or erythema  Cardiovascular: Normal rate, regular rhythm and normal heart sounds.  Exam reveals no gallop and no friction rub.   No murmur heard. Pulmonary/Chest: Effort normal and breath sounds normal. No respiratory distress. She has no wheezes. She has no rales.  Abdominal: Soft. Bowel sounds are normal. She exhibits no distension. There is no tenderness.  Musculoskeletal: Normal range of motion. She exhibits no edema and no tenderness.  Neurological: She is alert and oriented to person, place, and time. No cranial nerve deficit.  Skin: Skin is warm. She is not diaphoretic.  Psychiatric: She has a normal mood and affect. Her behavior is normal.          Assessment & Plan:  Please see problem based assessment and plan.

## 2014-01-03 NOTE — Progress Notes (Signed)
Case discussed with Dr. Wilson at the time of the visit.  We reviewed the resident's history and exam and pertinent patient test results.  I agree with the assessment, diagnosis, and plan of care documented in the resident's note. 

## 2014-01-13 NOTE — Addendum Note (Signed)
Addended by: Hulan Fray on: 01/13/2014 06:01 PM   Modules accepted: Orders

## 2014-01-22 ENCOUNTER — Telehealth: Payer: Self-pay | Admitting: Neurology

## 2014-01-22 ENCOUNTER — Other Ambulatory Visit: Payer: Self-pay | Admitting: Nurse Practitioner

## 2014-01-22 MED ORDER — TIZANIDINE HCL 2 MG PO TABS: 2.0000 mg | ORAL_TABLET | Freq: Three times a day (TID) | ORAL | Status: DC | PRN

## 2014-01-22 NOTE — Telephone Encounter (Signed)
I have sent in Tizanidine 2 mg tablets to take 1 every 8 hours prn for spasticity. #30 no refills.

## 2014-01-22 NOTE — Telephone Encounter (Signed)
Patient stopped taking Baclofen several months ago due to combative behavior.  She is having muscle spasms in her legs and would like to know if a different Rx could be prescribed.  Has a follow up scheduled in Sept 2015 with [facility].  Please advise.  Thank you.

## 2014-01-22 NOTE — Telephone Encounter (Signed)
Patient stated she's been off Baclofen for 4 months, due to being very combative.  She's experiencing severe muscle spasms in her legs.  Questioning if there's an alternatvie medication?  Please call home # first and if not available please call mobile # [phone], can leave a message if not available.  Thanks

## 2014-01-23 ENCOUNTER — Telehealth: Payer: Self-pay | Admitting: Neurology

## 2014-01-23 NOTE — Telephone Encounter (Signed)
I just called a few minutes ago and left message (See previous note).  I called back again.  Got no answer.  Left another message.

## 2014-01-23 NOTE — Telephone Encounter (Signed)
I called back.  Got no answer.  Left message.  

## 2014-01-23 NOTE — Telephone Encounter (Signed)
Patient requesting a call back regarding previous message about Baclofen, please return call to patient and advise.

## 2014-01-27 ENCOUNTER — Other Ambulatory Visit: Payer: Commercial Managed Care - HMO

## 2014-01-27 ENCOUNTER — Encounter: Payer: Commercial Managed Care - HMO | Admitting: Internal Medicine

## 2014-01-27 ENCOUNTER — Other Ambulatory Visit: Payer: Self-pay | Admitting: *Deleted

## 2014-01-27 DIAGNOSIS — G4700 Insomnia, unspecified: Secondary | ICD-10-CM

## 2014-01-27 DIAGNOSIS — E876 Hypokalemia: Secondary | ICD-10-CM

## 2014-01-27 NOTE — Telephone Encounter (Signed)
Pt here for visit, but unable to wait any longer (wishes to get back home to her dog). She's not having any problems and asked to reschedule for next week, appt made for 08/17 @ 2:45 with PCP.  Pt also requested Lunesta refill to be sent to be sent to [facility].  Pt had bmet obtained today as pt was told to hold potassium pills at last clinic visit.  Office will contact pt with results.Sandra Hidden Cassady8/10/20154:29 PM

## 2014-01-28 ENCOUNTER — Telehealth: Payer: Self-pay | Admitting: Internal Medicine

## 2014-01-28 ENCOUNTER — Other Ambulatory Visit: Payer: Self-pay | Admitting: Internal Medicine

## 2014-01-28 DIAGNOSIS — E876 Hypokalemia: Secondary | ICD-10-CM

## 2014-01-28 LAB — BASIC METABOLIC PANEL WITH GFR
BUN: 8 mg/dL (ref 6–23)
CALCIUM: 9 mg/dL (ref 8.4–10.5)
CHLORIDE: 107 meq/L (ref 96–112)
CO2: 28 mEq/L (ref 19–32)
Creat: 0.6 mg/dL (ref 0.50–1.10)
Glucose, Bld: 60 mg/dL — ABNORMAL LOW (ref 70–99)
Potassium: 3.2 mEq/L — ABNORMAL LOW (ref 3.5–5.3)
Sodium: 141 mEq/L (ref 135–145)

## 2014-01-28 MED ORDER — POTASSIUM CHLORIDE ER 20 MEQ PO TBCR: 40.0000 meq | EXTENDED_RELEASE_TABLET | Freq: Every day | ORAL | Status: DC

## 2014-01-28 MED ORDER — ESZOPICLONE 2 MG PO TABS
2.0000 mg | ORAL_TABLET | Freq: Every evening | ORAL | Status: DC | PRN
Start: ? — End: 2014-02-04

## 2014-01-28 NOTE — Telephone Encounter (Signed)
Sandra [name] was 3.2 on 01/27/2014.  I called and asked her to re-start Kdur 26mEq (two 20 mEq tablets) daily.  She is scheduled to return to clinic on 02/03/14 and I will have K and Mg checked at that visit.  She denies vomiting or diarrhea.  Her appetite has been good.  Her husband read the prescription bottle to me over the phone confirming the pill strength and administration instructions.  She has enough pills in the bottle to last until she returns for lab re-check.

## 2014-02-01 ENCOUNTER — Other Ambulatory Visit: Payer: Self-pay | Admitting: Internal Medicine

## 2014-02-03 ENCOUNTER — Ambulatory Visit (INDEPENDENT_AMBULATORY_CARE_PROVIDER_SITE_OTHER): Payer: Commercial Managed Care - HMO | Admitting: Internal Medicine

## 2014-02-03 VITALS — BP 138/92 | HR 75 | Temp 97.0°F | Wt 197.8 lb

## 2014-02-03 DIAGNOSIS — I10 Essential (primary) hypertension: Secondary | ICD-10-CM

## 2014-02-03 DIAGNOSIS — E876 Hypokalemia: Secondary | ICD-10-CM

## 2014-02-03 DIAGNOSIS — Z720 Tobacco use: Secondary | ICD-10-CM

## 2014-02-03 DIAGNOSIS — F17200 Nicotine dependence, unspecified, uncomplicated: Secondary | ICD-10-CM

## 2014-02-03 DIAGNOSIS — R197 Diarrhea, unspecified: Secondary | ICD-10-CM

## 2014-02-03 MED ORDER — POTASSIUM CHLORIDE ER 10 MEQ PO CPCR: ORAL_CAPSULE | ORAL | Status: DC

## 2014-02-03 NOTE — Patient Instructions (Addendum)
-  Continue taking your potassium pills until you receive the capsules.  -On Wednesday this week (August 19th) start taking 2 potassium capsules per day.  -You need to come back on Wednesday, August 26th for repeat labs.  -Follow up in 1 month for BP recheck and for flu vaccine.

## 2014-02-04 ENCOUNTER — Other Ambulatory Visit: Payer: Self-pay | Admitting: *Deleted

## 2014-02-04 DIAGNOSIS — G4700 Insomnia, unspecified: Secondary | ICD-10-CM

## 2014-02-04 LAB — BASIC METABOLIC PANEL WITH GFR
BUN: 15 mg/dL (ref 6–23)
CO2: 21 mEq/L (ref 19–32)
Calcium: 9.1 mg/dL (ref 8.4–10.5)
Chloride: 111 mEq/L (ref 96–112)
Creat: 0.67 mg/dL (ref 0.50–1.10)
Glucose, Bld: 101 mg/dL — ABNORMAL HIGH (ref 70–99)
Potassium: 4 mEq/L (ref 3.5–5.3)
SODIUM: 140 meq/L (ref 135–145)

## 2014-02-04 MED ORDER — ESZOPICLONE 2 MG PO TABS
2.0000 mg | ORAL_TABLET | Freq: Every evening | ORAL | Status: DC | PRN
Start: 2014-02-04 — End: 2014-02-14

## 2014-02-05 ENCOUNTER — Encounter: Payer: Self-pay | Admitting: Internal Medicine

## 2014-02-05 ENCOUNTER — Telehealth: Payer: Self-pay | Admitting: *Deleted

## 2014-02-05 NOTE — Assessment & Plan Note (Signed)
BP Readings from Last 3 Encounters:  02/03/14 138/92  01/03/14 131/82  12/31/13 140/70    Lab Results  Component Value Date   NA 140 02/03/2014   K 4.0 02/03/2014   CREATININE 0.67 02/03/2014    Assessment: Blood pressure control:  Controlled Progress toward BP goal:   At goal Comments: She is on Norvasc 10mg  daily and metoprolol 25mg  BID  Plan: Medications:  continue current medications. Educational resources provided:   Self management tools provided:   Other plans: Continue monitoring. No need to resume benicar

## 2014-02-05 NOTE — Assessment & Plan Note (Signed)
She had BMET last week with K of 3.2 after stooping potassium repletion. She is back on Kdur 2mEq daily with diarrhea 1-2 per week. The K-dur tablets are too big in size for her to swallow.  Checked BMET during this visit with K of 4.0.  Will continue potassium supplementation with K 10mEq, 2 capsules daily (called [facility] to [facility] and to be delivered on Wednesday) and recheck BMET next week--she may need to be on 38mEq capsules daily.

## 2014-02-05 NOTE — Telephone Encounter (Signed)
May refill x 1 month. Thanks.

## 2014-02-05 NOTE — Assessment & Plan Note (Signed)
  Assessment: Progress toward smoking cessation:   smoking less Barriers to progress toward smoking cessation:   withdrawal symptoms Comments: she wants to continue cutting back on # of cigarettes smoked per day  Plan: Instruction/counseling given:  I counseled patient on the dangers of tobacco use, advised patient to stop smoking, and reviewed strategies to maximize success. Educational resources provided:    Self management tools provided:    Medications to assist with smoking cessation:  None Patient agreed to the following self-care plans for smoking cessation:    Other plans: Continue smoking cessation counseling

## 2014-02-05 NOTE — Assessment & Plan Note (Signed)
She has had 1-2 watery, non bloody BMs this past week but mostly BMs with formed stools. C. Diff is possible with recent Abx use but unlikely as diarrhea has almost resolved. She describes the diarrhea to be associated with certain foods, especially butter with grits, and other dairy products.  She was encouraged to avoid dairy products for at least 1 month as she may have become lactose sensitive/intolerant due to her recent illness and Abx use with change in GI flora. -Will continue monitoring.

## 2014-02-05 NOTE — Telephone Encounter (Signed)
Patient is requesting a refill on Zanaflex, and would like it sent to [facility].   Previous phone note says:  Philmore Pali, NP at 01/22/2014 9:18 PM     Status: Signed        I have sent in Tizanidine 2 mg tablets to take 1 every 8 hours prn for spasticity. #30 no refills.   Patient has an appt scheduled in Sept.  Okay to refill?  Please advise.  Thank you.

## 2014-02-05 NOTE — Progress Notes (Signed)
   Subjective:    [name], female    DOB: [birth year], 53 y.o.   MRN: [id number]  HPI Sandra [name] is a 53 year old woman with PMH significant for HTN, CAD, hx of CVA, who presents accompanied by her husband for follow up visit for hypokalemia. She states that her Kdur tablets are too big in size and difficult to swallow but she is complaint to taking K-due 35mEq per day. Her husband tells me that he takes potassium supplementation with capsules that are much easier to swallow. Sandra [name] requests that her potassium supplementation be switched to capsules--these are available at her Pharmacy and can be delivered.  She tells me that her diarrhea has improved. She has 1-2 watery stool per week but its seems to be associated with certain foods she eats, especially butter and other dairy products.  She is still smoking but cutting down, actively trying to quit by smoking fewer cigarettes per day.      Review of Systems  Constitutional: Negative for fever, chills, diaphoresis, activity change, appetite change and fatigue.  Respiratory: Negative for cough, shortness of breath and wheezing.   Cardiovascular: Negative for chest pain and leg swelling.  Gastrointestinal: Positive for diarrhea. Negative for nausea, vomiting, abdominal pain and blood in stool.  Genitourinary: Negative for dysuria.  Neurological: Negative for dizziness, weakness and light-headedness.  Psychiatric/Behavioral: Negative for agitation.       Objective:   Physical Exam  Nursing note and vitals reviewed. Constitutional: She is oriented to person, place, and time. She appears well-developed and well-nourished. No distress.  Cardiovascular: Normal rate and regular rhythm.   Pulmonary/Chest: Effort normal and breath sounds normal. No respiratory distress. She has no wheezes. She has no rales.  Abdominal: Soft. Bowel sounds are normal. She exhibits no distension. There is no tenderness. There is no rebound and no  guarding.  Musculoskeletal: She exhibits no edema.  Neurological: She is alert and oriented to person, place, and time.  Skin: Skin is warm and dry. No rash noted. She is not diaphoretic. No erythema. No pallor.  Psychiatric: She has a normal mood and affect.          Assessment & Plan:

## 2014-02-06 MED ORDER — TIZANIDINE HCL 2 MG PO TABS: 2.0000 mg | ORAL_TABLET | Freq: Three times a day (TID) | ORAL | Status: DC | PRN

## 2014-02-06 NOTE — Telephone Encounter (Signed)
Rx has been sent  

## 2014-02-06 NOTE — Progress Notes (Signed)
INTERNAL MEDICINE TEACHING ATTENDING ADDENDUM - Aldine Contes, MD: I reviewed and discussed at the time of visit with the resident Dr. Tsui Ludwig, the patient's medical history, physical examination, diagnosis and results of pertinent tests and treatment and I agree with the patient's care as documented.

## 2014-02-12 ENCOUNTER — Other Ambulatory Visit: Payer: Commercial Managed Care - HMO

## 2014-02-13 ENCOUNTER — Telehealth: Payer: Self-pay | Admitting: *Deleted

## 2014-02-13 NOTE — Telephone Encounter (Signed)
Pt's spouse calls and c/o pt not being prescribed enough lunesta until she sees her new doctor, he states she needs 30 a month instead of 15.

## 2014-02-14 ENCOUNTER — Other Ambulatory Visit: Payer: Self-pay | Admitting: *Deleted

## 2014-02-14 ENCOUNTER — Other Ambulatory Visit (INDEPENDENT_AMBULATORY_CARE_PROVIDER_SITE_OTHER): Payer: Commercial Managed Care - HMO

## 2014-02-14 DIAGNOSIS — E876 Hypokalemia: Secondary | ICD-10-CM

## 2014-02-14 DIAGNOSIS — G4700 Insomnia, unspecified: Secondary | ICD-10-CM

## 2014-02-14 MED ORDER — ESZOPICLONE 2 MG PO TABS: 2.0000 mg | ORAL_TABLET | Freq: Every evening | ORAL | Status: DC | PRN

## 2014-02-14 NOTE — Telephone Encounter (Signed)
Thank you Bonnita Nasuti. I discussed the issue with Edd Fabian and we will change it to 30 tablets.

## 2014-02-14 NOTE — Telephone Encounter (Signed)
Refill for Lunesta # 15 was sent to pt on 8/26 from mail order pharmacy.

## 2014-02-15 LAB — BASIC METABOLIC PANEL WITH GFR
BUN: 13 mg/dL (ref 6–23)
CHLORIDE: 106 meq/L (ref 96–112)
CO2: 24 mEq/L (ref 19–32)
CREATININE: 0.71 mg/dL (ref 0.50–1.10)
Calcium: 9.5 mg/dL (ref 8.4–10.5)
GFR, Est African American: >89 mL/min
GFR, Est Non African American: >89 mL/min
Glucose, Bld: 93 mg/dL (ref 70–99)
Potassium: 4.5 mEq/L (ref 3.5–5.3)
Sodium: 140 mEq/L (ref 135–145)

## 2014-02-15 LAB — MAGNESIUM: MAGNESIUM: 1.9 mg/dL (ref 1.5–2.5)

## 2014-02-26 ENCOUNTER — Ambulatory Visit (INDEPENDENT_AMBULATORY_CARE_PROVIDER_SITE_OTHER): Payer: Medicare HMO | Admitting: Nurse Practitioner

## 2014-02-26 ENCOUNTER — Other Ambulatory Visit: Payer: Self-pay | Admitting: Internal Medicine

## 2014-02-26 ENCOUNTER — Encounter: Payer: Self-pay | Admitting: Nurse Practitioner

## 2014-02-26 VITALS — BP 155/91 | Ht 65.5 in | Wt 194.2 lb

## 2014-02-26 DIAGNOSIS — Q211 Atrial septal defect: Secondary | ICD-10-CM

## 2014-02-26 DIAGNOSIS — Z720 Tobacco use: Secondary | ICD-10-CM

## 2014-02-26 DIAGNOSIS — F17200 Nicotine dependence, unspecified, uncomplicated: Secondary | ICD-10-CM

## 2014-02-26 DIAGNOSIS — Z8673 Personal history of transient ischemic attack (TIA), and cerebral infarction without residual deficits: Secondary | ICD-10-CM

## 2014-02-26 DIAGNOSIS — I10 Essential (primary) hypertension: Secondary | ICD-10-CM

## 2014-02-26 DIAGNOSIS — Q2112 Patent foramen ovale: Secondary | ICD-10-CM

## 2014-02-26 DIAGNOSIS — Q2111 Secundum atrial septal defect: Secondary | ICD-10-CM

## 2014-02-26 MED ORDER — TIZANIDINE HCL 4 MG PO TABS: 2.0000 mg | ORAL_TABLET | Freq: Three times a day (TID) | ORAL | Status: DC | PRN

## 2014-02-26 NOTE — Progress Notes (Signed)
PATIENT: Sandra [name] DOB: December 03, 1960  REASON FOR VISIT: routine follow up for stroke HISTORY FROM: patient  HISTORY OF PRESENT ILLNESS: PRIOR HPI : 53year lady with right middle cerebral artery barnch infarct in August 2012 from right MCA occlusion with vascular risk factors of Diabetes, Hypertension, Hyperlipidimia, Smoking,CAD, Patent Foramen Ovale and mild Obesity.   Returns for followup after last visit on 07/21/11. She reports she is having an increase in dragging of her left leg notices this at different times. Denies any falls. Continues to have mild stiffness in left leg when she walks. She does not cook or lift heavy objects. She did not have a bubble study with doppler, Medicaid declined she states "because I had a heart attack and a stent". No new neurologic complaints. Reports her cholesterol and depression are stable. She has had a 7 pound weight loss since her last visit. She has completed physical and occupational therapy.   UPDATE 02/26/13 (LL): [name] returns for stroke follow up. She reports that she has done well, needs medication refills. She reports that she had office visit with Dr. Einar Gip recently, total cholesterol was 140 and Triglycerides were 145. Her fenofibrate was increased. She reports weakness in left arm and leg with spasticity. She states that Baclofen relieves her pain from spasticity. She states that she is smoking 1 pack of cigarettes a day again. She states she tried to quit but went back to it. She states she still has patches and may try to quit again; she knows she should.   UPDATE 02/26/14 (LL): Since last visit, she had extended hospitalization for MRSA pneumonia in May; was at [facility] and then transferred to [facility].  In the hospital over 3 weeks, on ventilator, became septic and lost vision in right eye.She has great recovery, but continues to smoke. Last lipid panel in Feb 2015 shows total cholesterol 147 and LDL 65. Blood pressure is elevated in the  office today at 155/91, but she states is usually well controlled. Last carotid doppler study was 1 year ago at Dr. Irven Shelling office, reportedly no significant stenosis. She is tolerating Plavix well with no signs of significant bleeding or bruising.    REVIEW OF SYSTEMS: Full 14 system review of systems performed and notable only for: loss of vision.  ALLERGIES: Allergies  Allergen Reactions  . Azor [Amlodipine-Olmesartan] Other (See Comments)    Drops blood pressure too low (takes plain amlodipine at home July 2015)  . Cozaar [Losartan Potassium]     HOME MEDICATIONS: Outpatient Prescriptions Prior to Visit  Medication Sig Dispense Refill  . allopurinol (ZYLOPRIM) 300 MG tablet Take 1 tablet (300 mg total) by mouth daily.  30 tablet  11  . amLODipine (NORVASC) 10 MG tablet Take 1 tablet (10 mg total) by mouth daily.  30 tablet  11  . atorvastatin (LIPITOR) 10 MG tablet Take 10 mg by mouth daily at 6 PM.       . atropine 1 % ophthalmic solution Place 1 drop into both eyes daily.      . clonazePAM (KLONOPIN) 0.5 MG tablet Take 1 mg by mouth 2 (two) times daily as needed for anxiety.      . clopidogrel (PLAVIX) 75 MG tablet Take 75 mg by mouth daily.      . DULoxetine (CYMBALTA) 20 MG capsule       . [START ON 02/27/2014] eszopiclone (LUNESTA) 2 MG TABS tablet Take 1 tablet (2 mg total) by mouth at bedtime as needed  for sleep. Take immediately before bedtime  30 tablet  0  . Fenofibrate (LIPOFEN) 50 MG CAPS Take 50 mg by mouth daily.      . metoprolol tartrate (LOPRESSOR) 25 MG tablet Take 1 tablet (25 mg total) by mouth 2 (two) times daily.  60 tablet  11  . potassium chloride (MICRO-K) 10 MEQ CR capsule Potassium capsules. Take two capsules per day.  60 capsule  0  . prednisoLONE acetate (PRED FORTE) 1 % ophthalmic suspension 1 drop 4 (four) times daily.      Marland Kitchen tiZANidine (ZANAFLEX) 2 MG tablet Take 1 tablet (2 mg total) by mouth every 8 (eight) hours as needed for muscle spasms.  90  tablet  0   No facility-administered medications prior to visit.    PHYSICAL EXAM Filed Vitals:   02/26/14 1330  BP: 155/91  Height: 5' 5.5" (1.664 m)  Weight: 194 lb 3.2 oz (88.089 kg)   Body mass index is 31.81 kg/(m^2). No exam data present No flowsheet data found.  No flowsheet data found.  Physical Exam  General: Pleasant middle aged Caucasian lady, in no distress. Afebrile.  Head: nontraumatic  Ears, Nose and Throat: Hearing is normal.  Neck: supple without bruit  Respiratory: clear to auscultation  Cardiovascular: no murmur or gallop  Musculoskeletal: no deformity  Skin: no rash  Generalized: In no acute distress, pleasant Caucasian female  Neck: Supple, no carotid bruits  Cardiac: Regular rate rhythm, no murmur  Pulmonary: Clear to auscultation bilaterally  Musculoskeletal: No deformity  Neurologic Exam  Mental Status: Awake, alert and oriented to time, place and person. Speech and language appear normal.  Cranial Nerves: Eye movements are full range without nystagmus. Fundi revealed sharp disc margins without papilledema. Visual fields are full to confrontational testing. Face is asymmetric with mild left lower face weakness. Tongue is midline. Hearing is normal.  Motor: reveals no upper or lower extremity drift. Symmetric and equal strength in all four extremities except mild left grip. Diminished fine finger movements on left and orbits right over left upper extremity. Increased tone in the left knee extensors with mild spasticity.  Sensory: Touch and pinprick sensations are normal.  Coordination: normal  Gait and Station: steady gait with mild left foot drop and stiff and dragging left leg. Unable to do tandem walking without support. Toe walking with mild foot drop  Reflexes: Deep tendon reflexes are 2+ asymmetrc brsiker on left. Plantars are downgoing.   ASSESSMENT: 53 year old female with right middle cerebral artery branch infarct in August 2012 from right  MCA occlusion with vascular risk factors of Diabetes, Hypertension, Hyperlipidimia, Current Smoker,CAD, Patent Foramen Ovale, and mild Obesity.   Plan :  Continue clopidogrel for stroke prevention with strict control of hypertension with blood pressure goal below 130/90, diabetes with hemoglobin A1c goal below 6.5%, lipids with LDL cholesterol goal below 70 milligrams percent. Advised her to exercise regularly, diet and continue weight loss. Continue Tizanidine for spasticity and pain. Recommended strongly that she stop smoking.  Followup in 12 months, sooner as needed.  Meds ordered this encounter  Medications  . tiZANidine (ZANAFLEX) 4 MG tablet    Sig: Take 0.5-1 tablets (2-4 mg total) by mouth every 8 (eight) hours as needed for muscle spasms.    Dispense:  90 tablet    Refill:  2    Order Specific Question:  Supervising Provider    Answer:  Antony Contras [[id number]]   Return in about 1 year (around 02/27/2015) for  stroke followup.  Rudi Rummage Bhavya Grand, MSN, FNP-BC, A/GNP-C 02/26/2014, 2:08 PM [facility] [address] [place], [place] [address] [phone]  Note: This document was prepared with digital dictation and possible smart phrase technology. Any transcriptional errors that result from this process are unintentional.

## 2014-02-26 NOTE — Patient Instructions (Signed)
Continue clopidogrel for stroke prevention with strict control of hypertension with blood pressure goal below 130/90, diabetes with hemoglobin A1c goal below 6.5%, lipids with LDL cholesterol goal below 70 milligrams percent. Advised her to exercise regularly, diet and continue weight loss. Continue baclofen for spasticity and pain. Recommended strongly that she stop smoking.  Carotid doppler study ordered. Followup in 12 months, sooner as needed.  Smoking Cessation, Tips for Success If you are ready to quit smoking, congratulations! You have chosen to help yourself be healthier. Cigarettes bring nicotine, tar, carbon monoxide, and other irritants into your body. Your lungs, heart, and blood vessels will be able to work better without these poisons. There are many different ways to quit smoking. Nicotine gum, nicotine patches, a nicotine inhaler, or nicotine nasal spray can help with physical craving. Hypnosis, support groups, and medicines help break the habit of smoking. WHAT THINGS CAN I DO TO MAKE QUITTING EASIER?  Here are some tips to help you quit for good:  Pick a date when you will quit smoking completely. Tell all of your friends and family about your plan to quit on that date.  Do not try to slowly cut down on the number of cigarettes you are smoking. Pick a quit date and quit smoking completely starting on that day.  Throw away all cigarettes.   Clean and remove all ashtrays from your home, work, and car.  On a card, write down your reasons for quitting. Carry the card with you and read it when you get the urge to smoke.  Cleanse your body of nicotine. Drink enough water and fluids to keep your urine clear or pale yellow. Do this after quitting to flush the nicotine from your body.  Learn to predict your moods. Do not let a bad situation be your excuse to have a cigarette. Some situations in your life might tempt you into wanting a cigarette.  Never have "just one" cigarette. It  leads to wanting another and another. Remind yourself of your decision to quit.  Change habits associated with smoking. If you smoked while driving or when feeling stressed, try other activities to replace smoking. Stand up when drinking your coffee. Brush your teeth after eating. Sit in a different chair when you read the paper. Avoid alcohol while trying to quit, and try to drink fewer caffeinated beverages. Alcohol and caffeine may urge you to smoke.  Avoid foods and drinks that can trigger a desire to smoke, such as sugary or spicy foods and alcohol.  Ask people who smoke not to smoke around you.  Have something planned to do right after eating or having a cup of coffee. For example, plan to take a walk or exercise.  Try a relaxation exercise to calm you down and decrease your stress. Remember, you may be tense and nervous for the first 2 weeks after you quit, but this will pass.  Find new activities to keep your hands busy. Play with a pen, coin, or rubber band. Doodle or draw things on paper.  Brush your teeth right after eating. This will help cut down on the craving for the taste of tobacco after meals. You can also try mouthwash.   Use oral substitutes in place of cigarettes. Try using lemon drops, carrots, cinnamon sticks, or chewing gum. Keep them handy so they are available when you have the urge to smoke.  When you have the urge to smoke, try deep breathing.  Designate your home as a nonsmoking area.  If you are a heavy smoker, ask your health care provider about a prescription for nicotine chewing gum. It can ease your withdrawal from nicotine.  Reward yourself. Set aside the cigarette money you save and buy yourself something nice.  Look for support from others. Join a support group or smoking cessation program. Ask someone at home or at work to help you with your plan to quit smoking.  Always ask yourself, "Do I need this cigarette or is this just a reflex?" Tell  yourself, "Today, I choose not to smoke," or "I do not want to smoke." You are reminding yourself of your decision to quit.  Do not replace cigarette smoking with electronic cigarettes (commonly called e-cigarettes). The safety of e-cigarettes is unknown, and some may contain harmful chemicals.  If you relapse, do not give up! Plan ahead and think about what you will do the next time you get the urge to smoke. HOW WILL I FEEL WHEN I QUIT SMOKING? You may have symptoms of withdrawal because your body is used to nicotine (the addictive substance in cigarettes). You may crave cigarettes, be irritable, feel very hungry, cough often, get headaches, or have difficulty concentrating. The withdrawal symptoms are only temporary. They are strongest when you first quit but will go away within 10-14 days. When withdrawal symptoms occur, stay in control. Think about your reasons for quitting. Remind yourself that these are signs that your body is healing and getting used to being without cigarettes. Remember that withdrawal symptoms are easier to treat than the major diseases that smoking can cause.  Even after the withdrawal is over, expect periodic urges to smoke. However, these cravings are generally short lived and will go away whether you smoke or not. Do not smoke! WHAT RESOURCES ARE AVAILABLE TO HELP ME QUIT SMOKING? Your health care provider can direct you to community resources or hospitals for support, which may include:  Group support.  Education.  Hypnosis.  Therapy. Document Released: 03/04/2004 Document Revised: 10/21/2013 Document Reviewed: 11/22/2012 [facility] Patient Information 2015 [place], [place]. This information is not intended to replace advice given to you by your health care provider. Make sure you discuss any questions you have with your health care provider.

## 2014-02-26 NOTE — Progress Notes (Signed)
I agree with the above plan 

## 2014-02-27 DIAGNOSIS — H44019 Panophthalmitis (acute), unspecified eye: Secondary | ICD-10-CM | POA: Diagnosis not present

## 2014-02-27 DIAGNOSIS — H44009 Unspecified purulent endophthalmitis, unspecified eye: Secondary | ICD-10-CM | POA: Diagnosis not present

## 2014-02-27 DIAGNOSIS — H4900 Third [oculomotor] nerve palsy, unspecified eye: Secondary | ICD-10-CM | POA: Diagnosis not present

## 2014-02-27 DIAGNOSIS — H02409 Unspecified ptosis of unspecified eyelid: Secondary | ICD-10-CM | POA: Diagnosis not present

## 2014-03-10 ENCOUNTER — Other Ambulatory Visit: Payer: Self-pay | Admitting: Internal Medicine

## 2014-03-10 ENCOUNTER — Other Ambulatory Visit: Payer: Self-pay | Admitting: Nurse Practitioner

## 2014-03-10 NOTE — Telephone Encounter (Signed)
Called [facility], the clonazepam is usually filled by dr Erlene Quan, he will be sent the request, i have voided the paper script

## 2014-03-11 NOTE — Telephone Encounter (Signed)
Thank you Bonnita Nasuti

## 2014-03-20 ENCOUNTER — Encounter: Payer: Commercial Managed Care - HMO | Admitting: Internal Medicine

## 2014-03-25 ENCOUNTER — Telehealth: Payer: Self-pay | Admitting: Internal Medicine

## 2014-03-25 ENCOUNTER — Ambulatory Visit: Payer: Commercial Managed Care - HMO | Admitting: Internal Medicine

## 2014-03-25 ENCOUNTER — Ambulatory Visit (INDEPENDENT_AMBULATORY_CARE_PROVIDER_SITE_OTHER): Payer: Commercial Managed Care - HMO | Admitting: Internal Medicine

## 2014-03-25 ENCOUNTER — Ambulatory Visit (HOSPITAL_COMMUNITY)
Admission: RE | Admit: 2014-03-25 | Discharge: 2014-03-25 | Disposition: A | Discharge status: Home / Self Care | Payer: Medicare HMO | Source: Ambulatory Visit | Attending: Internal Medicine | Admitting: Internal Medicine

## 2014-03-25 ENCOUNTER — Other Ambulatory Visit: Payer: Self-pay | Admitting: Internal Medicine

## 2014-03-25 ENCOUNTER — Encounter: Payer: Self-pay | Admitting: Internal Medicine

## 2014-03-25 VITALS — BP 147/80 | HR 70 | Temp 98.0°F | Ht 65.5 in | Wt 202.2 lb

## 2014-03-25 DIAGNOSIS — Z720 Tobacco use: Secondary | ICD-10-CM

## 2014-03-25 DIAGNOSIS — J189 Pneumonia, unspecified organism: Secondary | ICD-10-CM | POA: Diagnosis not present

## 2014-03-25 DIAGNOSIS — Z0000 Encounter for general adult medical examination without abnormal findings: Secondary | ICD-10-CM

## 2014-03-25 DIAGNOSIS — Z8701 Personal history of pneumonia (recurrent): Secondary | ICD-10-CM

## 2014-03-25 DIAGNOSIS — I10 Essential (primary) hypertension: Secondary | ICD-10-CM

## 2014-03-25 DIAGNOSIS — F419 Anxiety disorder, unspecified: Secondary | ICD-10-CM

## 2014-03-25 MED ORDER — NICOTINE 14 MG/24HR TD PT24: 14.0000 mg | MEDICATED_PATCH | Freq: Every day | TRANSDERMAL | Status: AC

## 2014-03-25 MED ORDER — NICOTINE 21 MG/24HR TD PT24: 21.0000 mg | MEDICATED_PATCH | Freq: Every day | TRANSDERMAL | Status: AC

## 2014-03-25 MED ORDER — NICOTINE 7 MG/24HR TD PT24: 7.0000 mg | MEDICATED_PATCH | Freq: Every day | TRANSDERMAL | Status: AC

## 2014-03-25 MED ORDER — OLMESARTAN MEDOXOMIL 20 MG PO TABS: 20.0000 mg | ORAL_TABLET | Freq: Every day | ORAL | Status: DC

## 2014-03-25 NOTE — Assessment & Plan Note (Addendum)
BP Readings from Last 3 Encounters:  03/25/14 147/80  02/26/14 155/91  02/03/14 138/92   Lab Results  Component Value Date   NA 140 02/14/2014   K 4.5 02/14/2014   CREATININE 0.71 02/14/2014   Assessment: Blood pressure control: moderately elevated Progress toward BP goal:  unchanged Comments: continues to smoke cigarettes. Was previously well controlled with benicar hct as well but was stopped due to aki and not restarted and has also had severe hypokalemia in the recent past.   Plan: Medications:  discussed with PCP: will resume low dose benicar today 20mg , NOT HCTZ at this time due to prior hypokalemia and continue metoprolol 25mg  bid and norvasc 10mg  qd. HR 70s today, could go up on metoprolol in the future if not tolerating ARB but given prior history of well controlled BP on benicar, will favor trying that today and per patient preference Educational resources provided: brochure Self management tools provided:   Other plans: RTC 2 weeks for bp recheck and bmet recommended. Goal hopefully can get <130/80

## 2014-03-25 NOTE — Patient Instructions (Signed)
General Instructions:  Please restart benicar 20mg  daily for now, and return to clinic in 2 weeks for BP check and lab work  Try to check your blood pressure at home if possible  Please bring in your records for flu vaccine  Please get a follow up cxr as soon as possible  Please consider stopping smoking    Please bring your medicines with you each time you come to clinic.  Medicines may include prescription medications, over-the-counter medications, herbal remedies, eye drops, vitamins, or other pills.   Progress Toward Treatment Goals:  Treatment Goal 03/25/2014  Blood pressure unchanged  Stop smoking smoking the same amount  Prevent falls -    Self Care Goals & Plans:  Self Care Goal 03/25/2014  Manage my medications take my medicines as prescribed; bring my medications to every visit  Monitor my health -  Eat healthy foods drink diet soda or water instead of juice or soda; eat more vegetables; eat foods that are low in salt; eat baked foods instead of fried foods; eat fruit for snacks and desserts  Be physically active -  Stop smoking -  Meeting treatment goals -    No flowsheet data found.   Care Management & Community Referrals:  Referral 11/04/2013  Referrals made for care management support none needed  Referrals made to community resources -

## 2014-03-25 NOTE — Assessment & Plan Note (Signed)
Rounded density noted in left lung on xray report from [facility] on 10/25/13--recommended follow up imaging and if not cleared CT chest with contrast. No repeat follow up cxr done. Given active smoker and prior history, will repeat cxr today. Discussed with PCP and will forward results to pcp as well.   -CXR today

## 2014-03-25 NOTE — Progress Notes (Signed)
Subjective:   [name] female   DOB: December 04, 1960 53 y.o.   MRN: [id number]  HPI: [name] is a 53 y.o. female with PMH of CVA in 2012 (follows with Dr. Leonie Man), endophthalmitis of b/l eyes with blindness in R eye, R CN 3 palsy, CAD s/p MI with BMS on plavix, HTN, HLD presenting to opc today for BP check.    Last seen at [facility] ED 03/14/14 for R eye pain, thought to be 2/2 impending phthisis--atropine drops were restarted in addition to lubricating eye drops. Follow up recommended with ocularplastic fellow in one week of that visit but has not followed up with them yet. Has an appointment tomorrow with Dr. Britta Mccreedy but trying to change appointment.   Still smoking--requesting nicotine patches, approximately 1/2 ppd, husband smokes as well.   Papsmear with pcp on next visit. Flu shot done at [facility] in [place] on Friday per husband. Asked for them to bring records for pcp to review.   HTN--BP remains elevated 155/76 today, down to 147/80 on repeat. Should be on norvasc 10mg  and metoprolol 25mg  bid. Has been on benicar HCT 40-12.5mg  in the past but was held due to AKI that has since then resolved. Of note, she has been admitted for severe hypokalemia in the interim and has not restarted the beincar. Cozaar listed as allergy but she is not aware of why that is listed and she tolerates benicar without complaints and wishes to be back on it if needed.  Goal blood pressure should be below 130/90 as stressed to patient by neurology last month.    Past Medical History  Diagnosis Date  . PFO (patent foramen ovale) August 2012    PFO seen on TEE during hospitalization in 01/2011.  Patient to f/u with Dr. Leonie Man, neurology, for enrollment in trial for medical treatment of PFO  . CAD (coronary artery disease) 2007    istory of MI with stent in 2007 by Dr. Chancy Milroy, [facility]. Stent placement by Dr Cleatis Polka   . Prediabetes 03/07/2011  . Hypertension 03/07/2011  . Hyperlipidemia  03/07/2011  . Tobacco abuse 03/07/2011  . Depression 03/07/2011  . Psoriasis 03/07/2011  . Acute right MCA stroke 01/27/11    Acute R MCA stroked  with L arm and leg weakness  . Anxiety 1980s    On Klonipin since age 62. Had addiction problem with Xanax which  Was therefore d/c . Seen in the past at [facility].   . Fractured toe 01/2011    History of left fifth toe proximal phalanx fracture when patient had a stroke (01/2011) and fell. Seen by Dr Doran Durand.   . Incontinence 04/14/2011  . Arthritis 04/16/2011  . Tobacco abuse 03/07/2011    Quit 01/2011   . Fracture of fifth toe, left, closed 01/28/2011    History of left fifth toe proximal phalanx fracture when patient had a stroke (01/2011) and fell. Seen by Dr Doran Durand.    . Substance abuse     h/o narcotic abuse pt denies as of 01/09/12  . Myocardial infarct, old 2008    stents   Current Outpatient Prescriptions  Medication Sig Dispense Refill  . allopurinol (ZYLOPRIM) 300 MG tablet TAKE 1 TABLET BY MOUTH ONCE DAILY  30 tablet  3  . amLODipine (NORVASC) 5 MG tablet TAKE 2 TABLETS BY MOUTH ONCE DAILY  60 tablet  3  . atorvastatin (LIPITOR) 10 MG tablet TAKE 1 TABLET BY MOUTH EVERY DAY EVERY 6PM  30 tablet  3  . atropine 1 % ophthalmic solution Place 1 drop into both eyes daily.      . clopidogrel (PLAVIX) 75 MG tablet TAKE 1 TABLET BY MOUTH ONCE DAILY  30 tablet  3  . DULoxetine (CYMBALTA) 20 MG capsule       . eszopiclone (LUNESTA) 2 MG TABS tablet Take 1 tablet (2 mg total) by mouth at bedtime as needed for sleep. Take immediately before bedtime  30 tablet  0  . metoprolol tartrate (LOPRESSOR) 25 MG tablet TAKE 1 TABLET BY MOUTH TWICE DAILY  60 tablet  3  . potassium chloride (MICRO-K) 10 MEQ CR capsule TAKE 2 CAPSULES BY MOUTH DAILY  60 capsule  1  . prednisoLONE acetate (PRED FORTE) 1 % ophthalmic suspension 1 drop 4 (four) times daily.      Marland Kitchen tiZANidine (ZANAFLEX) 4 MG tablet Take 0.5-1 tablets (2-4 mg total) by mouth every 8 (eight)  hours as needed for muscle spasms.  90 tablet  2   No current facility-administered medications for this visit.   Family History  Problem Relation Age of Onset  . Heart disease Mother   . Diabetes Mother   . Heart disease Father   . Diabetes Father   . Heart disease Brother   . Diabetes Brother   . Heart disease Maternal Grandmother   . Heart disease Maternal Grandfather   . Heart disease Paternal Grandmother   . Heart disease Paternal Grandfather   . Lung cancer Brother   . Heart disease Brother    History   Social History  . Marital Status: Married    Spouse Name: dexter    Number of Children: 1  . Years of Education: 12   Occupational History  . Disabled    Social History Main Topics  . Smoking status: Current Every Day Smoker -- 0.50 packs/day for 30 years    Types: Cigarettes    Last Attempt to Quit: 10/18/2013  . Smokeless tobacco: Never Used     Comment: 1 pack/day  . Alcohol Use: No  . Drug Use: No  . Sexual Activity: None   Other Topics Concern  . None   Social History Narrative   Pt lives at home with sister and brother pt quit smoking 01/2011 pt denies drinking or using any illicit  drugs. Pt is a high school grad.   Review of Systems:  Constitutional:  Denies fever, chills  HEENT:  R eye occasional pain, R eye blindness  Respiratory:  Denies SOB   Cardiovascular:  Denies chest pain  Gastrointestinal:  Denies nausea, vomiting  Musculoskeletal:  Sitting in wheelchair but able to stand without assistance   Skin:  Denies rash  Neurological:  Denies headache   Objective:  Physical Exam: Filed Vitals:   03/25/14 0936  BP: 155/76  Pulse: 88  Temp: 98 F (36.7 C)  TempSrc: Oral  Height: 5' 5.5" (1.664 m)  Weight: 202 lb 3.2 oz (91.717 kg)  SpO2: 100%   Vitals reviewed. General: sitting in wheelchair, NAD, smells of smoke HEENT: EOMI, slight horizontal nystagmus on right and left gaze, R eye blind Cardiac: RRR, +2/6 blowing murmur, loudest  left sternal border Pulm: clear to auscultation bilaterally, no wheezes, rales, or rhonchi, slightly decreased at bases Abd: soft, obese, BS present Ext: moving all 4 extremities, able to stand without assistance Neuro: alert and oriented X3,  Assessment & Plan:  Discussed with Dr. Dareen Piano F/u CXR after PNA in May HTN--restarted benicar low dose RTC  2 weeks

## 2014-03-25 NOTE — Progress Notes (Signed)
INTERNAL MEDICINE TEACHING ATTENDING ADDENDUM - Javeah Loeza, MD: I reviewed and discussed at the time of visit with the resident Dr. Qureshi, the patient's medical history, physical examination, diagnosis and results of pertinent tests and treatment and I agree with the patient's care as documented.  

## 2014-03-25 NOTE — Assessment & Plan Note (Signed)
  Assessment: Progress toward smoking cessation:  smoking the same amount (1/2ppd) Barriers to progress toward smoking cessation:    Comments: still smoking ~1/2ppd, interested in patches, husband smokes as well  Plan: Instruction/counseling given:  I counseled patient on the dangers of tobacco use, advised patient to stop smoking, and reviewed strategies to maximize success. Educational resources provided:  [employer] account manager) brochure (wants patches for) Self management tools provided:    Medications to assist with smoking cessation:  Nicotine Patch 6 week 21mg , 2 week 14mg  and 2 week 7mg  taper prescribed today Patient agreed to the following self-care plans for smoking cessation:    Other plans: counseled both husband and wife to quit smoking together if possible

## 2014-03-25 NOTE — Assessment & Plan Note (Signed)
Apparently also on buspar 10mg  daily, klonopin 1mg  bid, and abilify 5mg  qhs. Added these medications to current medication list today. Prescribed by outside provider.

## 2014-03-25 NOTE — Assessment & Plan Note (Addendum)
papsmear on next visit with pcp--prefers female pcp Reports having flu vaccine last Friday at [facility] in [place], requested them to bring records for pcp review if possible

## 2014-03-25 NOTE — Telephone Encounter (Signed)
I called Sandra [name] to review her cxr results from today that showed near complete resolution of the previously noted left midlung pna. She acknowledged understanding of the results and was able to relay them back to me.   She also noted that she does not think her insurance covers the nicotine patches but her husband found patches at home of 14mg . I asked her to try calling 1800quitnow for possible assistance with affordability in the meantime. I will also send this to pcp incase he has further recommendations or ideas.

## 2014-03-26 NOTE — Telephone Encounter (Signed)
Rx called in to pharmacy. Hilda Blades Dale Strausser RN 03/26/14 2:45PM

## 2014-03-27 ENCOUNTER — Other Ambulatory Visit: Payer: Self-pay | Admitting: Internal Medicine

## 2014-04-07 ENCOUNTER — Emergency Department (HOSPITAL_BASED_OUTPATIENT_CLINIC_OR_DEPARTMENT_OTHER)
Admission: EM | Admit: 2014-04-07 | Discharge: 2014-04-07 | Disposition: A | Discharge status: Home / Self Care | Payer: Medicare HMO | Attending: Emergency Medicine | Admitting: Emergency Medicine

## 2014-04-07 ENCOUNTER — Emergency Department (HOSPITAL_BASED_OUTPATIENT_CLINIC_OR_DEPARTMENT_OTHER): Payer: Medicare HMO

## 2014-04-07 ENCOUNTER — Encounter (HOSPITAL_BASED_OUTPATIENT_CLINIC_OR_DEPARTMENT_OTHER): Payer: Self-pay | Admitting: Emergency Medicine

## 2014-04-07 DIAGNOSIS — I2510 Atherosclerotic heart disease of native coronary artery without angina pectoris: Secondary | ICD-10-CM | POA: Insufficient documentation

## 2014-04-07 DIAGNOSIS — Z7952 Long term (current) use of systemic steroids: Secondary | ICD-10-CM | POA: Insufficient documentation

## 2014-04-07 DIAGNOSIS — Z8673 Personal history of transient ischemic attack (TIA), and cerebral infarction without residual deficits: Secondary | ICD-10-CM | POA: Diagnosis not present

## 2014-04-07 DIAGNOSIS — E785 Hyperlipidemia, unspecified: Secondary | ICD-10-CM | POA: Insufficient documentation

## 2014-04-07 DIAGNOSIS — J189 Pneumonia, unspecified organism: Secondary | ICD-10-CM

## 2014-04-07 DIAGNOSIS — Z87891 Personal history of nicotine dependence: Secondary | ICD-10-CM | POA: Insufficient documentation

## 2014-04-07 DIAGNOSIS — Z79899 Other long term (current) drug therapy: Secondary | ICD-10-CM | POA: Diagnosis not present

## 2014-04-07 DIAGNOSIS — H5441 Blindness, right eye, normal vision left eye: Secondary | ICD-10-CM | POA: Insufficient documentation

## 2014-04-07 DIAGNOSIS — Z9861 Coronary angioplasty status: Secondary | ICD-10-CM | POA: Diagnosis not present

## 2014-04-07 DIAGNOSIS — J159 Unspecified bacterial pneumonia: Secondary | ICD-10-CM | POA: Insufficient documentation

## 2014-04-07 DIAGNOSIS — M1990 Unspecified osteoarthritis, unspecified site: Secondary | ICD-10-CM | POA: Insufficient documentation

## 2014-04-07 DIAGNOSIS — F419 Anxiety disorder, unspecified: Secondary | ICD-10-CM | POA: Insufficient documentation

## 2014-04-07 DIAGNOSIS — Z8781 Personal history of (healed) traumatic fracture: Secondary | ICD-10-CM | POA: Diagnosis not present

## 2014-04-07 DIAGNOSIS — Q211 Atrial septal defect: Secondary | ICD-10-CM | POA: Insufficient documentation

## 2014-04-07 DIAGNOSIS — Z7902 Long term (current) use of antithrombotics/antiplatelets: Secondary | ICD-10-CM | POA: Insufficient documentation

## 2014-04-07 DIAGNOSIS — I10 Essential (primary) hypertension: Secondary | ICD-10-CM | POA: Diagnosis not present

## 2014-04-07 DIAGNOSIS — R05 Cough: Secondary | ICD-10-CM | POA: Diagnosis present

## 2014-04-07 DIAGNOSIS — F329 Major depressive disorder, single episode, unspecified: Secondary | ICD-10-CM | POA: Diagnosis not present

## 2014-04-07 DIAGNOSIS — I252 Old myocardial infarction: Secondary | ICD-10-CM | POA: Insufficient documentation

## 2014-04-07 DIAGNOSIS — L409 Psoriasis, unspecified: Secondary | ICD-10-CM | POA: Diagnosis not present

## 2014-04-07 HISTORY — DX: Blindness, one eye, unspecified eye: H54.40

## 2014-04-07 HISTORY — DX: Pneumonia, unspecified organism: J18.9

## 2014-04-07 MED ORDER — LEVOFLOXACIN 500 MG PO TABS: 500.0000 mg | ORAL_TABLET | Freq: Every day | ORAL | Status: DC

## 2014-04-07 NOTE — ED Notes (Signed)
MD and this RN at the bedside

## 2014-04-07 NOTE — ED Provider Notes (Signed)
CSN: [id number]     Arrival date & time 04/07/14  1600 History  This chart was scribed for Debby Freiberg, MD by Tula Nakayama, ED Scribe. This patient was seen in room MH04/MH04 and the patient's care was started at 5:46 PM.    Chief Complaint  Patient presents with  . Cough   The history is provided by the patient. No language interpreter was used.   HPI Comments: Sandra [name] is a 53 y.o. female with a history of CVA and MI who presents to the Emergency Department complaining of productive cough with yellow phlegm and congestion that started 2 days ago. Pt states a history of prior symptoms and recent PNA dx 5 months ago sp treatment with full abx course. Pt denies fever, nausea, vomiting, CP, abdominal pain, and SOB as associated symptoms. She has positive sick contact at home with similar symptoms.    Past Medical History  Diagnosis Date  . PFO (patent foramen ovale) August 2012    PFO seen on TEE during hospitalization in 01/2011.  Patient to f/u with Dr. Leonie Man, neurology, for enrollment in trial for medical treatment of PFO  . CAD (coronary artery disease) 2007    istory of MI with stent in 2007 by Dr. Chancy Milroy, [facility]. Stent placement by Dr Cleatis Polka   . Prediabetes 03/07/2011  . Hypertension 03/07/2011  . Hyperlipidemia 03/07/2011  . Tobacco abuse 03/07/2011  . Depression 03/07/2011  . Psoriasis 03/07/2011  . Acute right MCA stroke 01/27/11    Acute R MCA stroked  with L arm and leg weakness  . Anxiety 1980s    On Klonipin since age 55. Had addiction problem with Xanax which  Was therefore d/c . Seen in the past at [facility].   . Fractured toe 01/2011    History of left fifth toe proximal phalanx fracture when patient had a stroke (01/2011) and fell. Seen by Dr Doran Durand.   . Incontinence 04/14/2011  . Arthritis 04/16/2011  . Tobacco abuse 03/07/2011    Quit 01/2011   . Fracture of fifth toe, left, closed 01/28/2011    History of left fifth toe proximal phalanx fracture  when patient had a stroke (01/2011) and fell. Seen by Dr Doran Durand.    . Substance abuse     h/o narcotic abuse pt denies as of 01/09/12  . Myocardial infarct, old 2008    stents  . Blind right eye   . Pneumonia    Past Surgical History  Procedure Laterality Date  . Cardiac stent      2008  . Eye surgery     Family History  Problem Relation Age of Onset  . Heart disease Mother   . Diabetes Mother   . Heart disease Father   . Diabetes Father   . Heart disease Brother   . Diabetes Brother   . Heart disease Maternal Grandmother   . Heart disease Maternal Grandfather   . Heart disease Paternal Grandmother   . Heart disease Paternal Grandfather   . Lung cancer Brother   . Heart disease Brother    History  Substance Use Topics  . Smoking status: Former Smoker -- 0.50 packs/day for 30 years    Types: Cigarettes    Quit date: 04/07/2014  . Smokeless tobacco: Never Used     Comment: 1 pack/day  . Alcohol Use: No   OB History   Grav Para Term Preterm Abortions TAB SAB Ect Mult Living  Review of Systems  Constitutional: Negative for fever.  HENT: Positive for congestion.   Respiratory: Positive for cough. Negative for shortness of breath.   Cardiovascular: Negative for chest pain.  Gastrointestinal: Negative for nausea, vomiting and abdominal pain.  All other systems reviewed and are negative.   Allergies  Azor and Cozaar  Home Medications   Prior to Admission medications   Medication Sig Start Date End Date Taking? Authorizing Provider  allopurinol (ZYLOPRIM) 300 MG tablet TAKE 1 TABLET BY MOUTH ONCE DAILY 03/10/14  Yes Nischal Narendra, MD  amLODipine (NORVASC) 5 MG tablet TAKE 2 TABLETS BY MOUTH ONCE DAILY 03/10/14  Yes Nischal Narendra, MD  ARIPiprazole (ABILIFY) 5 MG tablet Take 5 mg by mouth at bedtime.   Yes Historical Provider, MD  atorvastatin (LIPITOR) 10 MG tablet TAKE 1 TABLET BY MOUTH EVERY DAY EVERY 6PM 03/10/14  Yes Nischal Narendra, MD   atropine 1 % ophthalmic solution Place 1 drop into both eyes daily. 11/02/13 11/02/14 Yes Historical Provider, MD  busPIRone (BUSPAR) 10 MG tablet Take 10 mg by mouth daily.   Yes Historical Provider, MD  clonazePAM (KLONOPIN) 1 MG tablet Take 1 mg by mouth 2 (two) times daily.   Yes Historical Provider, MD  clopidogrel (PLAVIX) 75 MG tablet TAKE 1 TABLET BY MOUTH ONCE DAILY 03/10/14  Yes Nischal Narendra, MD  eszopiclone (LUNESTA) 2 MG TABS tablet TAKE 1 TABLET BY MOUTH AT BEDTIME AS NEEDED FOR SLEEP 03/25/14  Yes Nischal Narendra, MD  metoprolol tartrate (LOPRESSOR) 25 MG tablet TAKE 1 TABLET BY MOUTH TWICE DAILY 03/10/14  Yes Nischal Narendra, MD  nicotine (NICODERM CQ - DOSED IN MG/24 HOURS) 14 mg/24hr patch Place 1 patch (14 mg total) onto the skin daily. 05/06/14 05/20/14 Yes Wilber Oliphant, MD  nicotine (NICODERM CQ - DOSED IN MG/24 HOURS) 21 mg/24hr patch Place 1 patch (21 mg total) onto the skin daily. 03/25/14 05/06/14 Yes Wilber Oliphant, MD  nicotine (NICODERM CQ - DOSED IN MG/24 HR) 7 mg/24hr patch Place 1 patch (7 mg total) onto the skin daily. 05/20/14 06/03/14 Yes Wilber Oliphant, MD  olmesartan (BENICAR) 20 MG tablet Take 1 tablet (20 mg total) by mouth daily. 03/25/14  Yes Wilber Oliphant, MD  potassium chloride (MICRO-K) 10 MEQ CR capsule TAKE 2 CAPSULES BY MOUTH DAILY 02/27/14  Yes Nischal Narendra, MD  prednisoLONE acetate (PRED FORTE) 1 % ophthalmic suspension 1 drop 4 (four) times daily.   Yes Historical Provider, MD  tiZANidine (ZANAFLEX) 4 MG tablet Take 0.5-1 tablets (2-4 mg total) by mouth every 8 (eight) hours as needed for muscle spasms. 02/26/14  Yes Philmore Pali, NP  Carboxymethylcellulose Sodium 1 % GEL Apply 1 drop to eye. 03/14/14   Historical Provider, MD  DULoxetine (CYMBALTA) 20 MG capsule  12/20/13   Historical Provider, MD  levofloxacin (LEVAQUIN) 500 MG tablet Take 1 tablet (500 mg total) by mouth daily. 04/07/14   Debby Freiberg, MD   BP 137/96  Pulse 80  Temp(Src)  98.9 F (37.2 C) (Oral)  Resp 20  Ht 5' 5.5" (1.664 m)  Wt 205 lb (92.987 kg)  BMI 33.58 kg/m2  SpO2 98% Physical Exam  Nursing note and vitals reviewed. Constitutional: She is oriented to person, place, and time. She appears well-developed and well-nourished. No distress.  HENT:  Head: Normocephalic and atraumatic.  Right Ear: External ear normal.  Left Ear: External ear normal.  Eyes: Conjunctivae and EOM are normal. Pupils are equal, round, and reactive to  light.  Neck: Normal range of motion. Neck supple. No tracheal deviation present.  Cardiovascular: Normal rate, regular rhythm, normal heart sounds and intact distal pulses.   Pulmonary/Chest: Effort normal. No respiratory distress. She has rales in the right lower field and the left lower field.  Abdominal: Soft. Bowel sounds are normal. There is no tenderness.  Musculoskeletal: Normal range of motion.  Neurological: She is alert and oriented to person, place, and time.  Skin: Skin is warm and dry.  Psychiatric: She has a normal mood and affect. Her behavior is normal.    ED Course  Procedures (including critical care time) DIAGNOSTIC STUDIES: Oxygen Saturation is 95% on RA, adequate by my interpretation.    COORDINATION OF CARE: 5:48 PM Discussed prior CVA and risk for pneumonia. Will order antibiotic. Discussed treatment plan with pt at bedside and pt agreed to plan.  Labs Review Labs Reviewed - No data to display  Imaging Review Dg Chest 2 View  04/07/2014   CLINICAL DATA:  Cough  EXAM: CHEST  2 VIEW  COMPARISON:  03/25/2014  FINDINGS: Mild patchy right lower lobe opacity, suspicious for pneumonia.  Linear opacity in the left upper lobe/ lingula, likely scarring. No pleural effusion or pneumothorax.  The heart is top-normal in size.  Mild degenerative changes of the visualized thoracolumbar spine.  IMPRESSION: Mild patchy right lower lobe opacity, suspicious for pneumonia.   Electronically Signed   By: Julian Hy M.D.   On: 04/07/2014 17:36     EKG Interpretation None      MDM   Final diagnoses:  Community acquired pneumonia    53 y.o. female with pertinent PMH of recent PNA 5 months prior, prior CVA with L sided deficit presents with recurrent cough x 2 days.  No fevers, gi symptoms, cp, or dyspnea.  On arrival vitals and physical exam as above.  CXR with PNA.  Will treat with levaquin and outpt management, as pt has no O2 requirement, is otherwise well.  DC home in stable condition with strict return precautions.    1. Community acquired pneumonia           Debby Freiberg, MD 04/08/14 1620

## 2014-04-07 NOTE — ED Notes (Signed)
Patient transported to X-ray 

## 2014-04-07 NOTE — ED Notes (Signed)
Cough and congestion x 2 days 

## 2014-04-07 NOTE — Discharge Instructions (Signed)

## 2014-04-08 ENCOUNTER — Telehealth: Payer: Self-pay | Admitting: *Deleted

## 2014-04-08 ENCOUNTER — Encounter: Payer: Self-pay | Admitting: Internal Medicine

## 2014-04-08 ENCOUNTER — Ambulatory Visit (INDEPENDENT_AMBULATORY_CARE_PROVIDER_SITE_OTHER): Payer: Commercial Managed Care - HMO | Admitting: Internal Medicine

## 2014-04-08 ENCOUNTER — Other Ambulatory Visit: Payer: Self-pay | Admitting: Internal Medicine

## 2014-04-08 VITALS — BP 142/83 | HR 71 | Temp 98.4°F | Wt 204.1 lb

## 2014-04-08 DIAGNOSIS — Z206 Contact with and (suspected) exposure to human immunodeficiency virus [HIV]: Secondary | ICD-10-CM

## 2014-04-08 DIAGNOSIS — J189 Pneumonia, unspecified organism: Secondary | ICD-10-CM

## 2014-04-08 DIAGNOSIS — I10 Essential (primary) hypertension: Secondary | ICD-10-CM

## 2014-04-08 LAB — COMPLETE METABOLIC PANEL WITH GFR
ALBUMIN: 4.1 g/dL (ref 3.5–5.2)
ALK PHOS: 85 U/L (ref 39–117)
ALT: 15 U/L (ref 0–35)
AST: 10 U/L (ref 0–37)
BUN: 10 mg/dL (ref 6–23)
CO2: 22 mEq/L (ref 19–32)
Calcium: 9.2 mg/dL (ref 8.4–10.5)
Chloride: 108 mEq/L (ref 96–112)
Creat: 0.8 mg/dL (ref 0.50–1.10)
GFR, Est African American: >89 mL/min
GFR, Est Non African American: 85 mL/min
Glucose, Bld: 102 mg/dL — ABNORMAL HIGH (ref 70–99)
Potassium: 3.8 mEq/L (ref 3.5–5.3)
Sodium: 141 mEq/L (ref 135–145)
Total Bilirubin: 0.4 mg/dL (ref 0.2–1.2)
Total Protein: 6.5 g/dL (ref 6.0–8.3)

## 2014-04-08 LAB — CBC WITH DIFFERENTIAL/PLATELET
BASOS ABS: 0 10*3/uL (ref 0.0–0.1)
Basophils Relative: 0 % (ref 0–1)
EOS PCT: 5 % (ref 0–5)
Eosinophils Absolute: 0.3 10*3/uL (ref 0.0–0.7)
HEMATOCRIT: 38.4 % (ref 36.0–46.0)
Hemoglobin: 12.9 g/dL (ref 12.0–15.0)
LYMPHS PCT: 25 % (ref 12–46)
Lymphs Abs: 1.5 10*3/uL (ref 0.7–4.0)
MCH: 28 pg (ref 26.0–34.0)
MCHC: 33.6 g/dL (ref 30.0–36.0)
MCV: 83.5 fL (ref 78.0–100.0)
MONO ABS: 0.3 10*3/uL (ref 0.1–1.0)
Monocytes Relative: 5 % (ref 3–12)
Neutro Abs: 3.9 10*3/uL (ref 1.7–7.7)
Neutrophils Relative %: 65 % (ref 43–77)
Platelets: 241 10*3/uL (ref 150–400)
RBC: 4.6 MIL/uL (ref 3.87–5.11)
RDW: 14.7 % (ref 11.5–15.5)
WBC: 6 10*3/uL (ref 4.0–10.5)

## 2014-04-08 NOTE — Patient Instructions (Signed)
Thank you for your visit today.   Please return to the internal medicine clinic if your symptoms do not improve. Please continue to take your antibiotics as directed until finished.   I will check some basic labs today.  It is important that you stay hydrated and eat regularly.  Congratulations on not smoking!!   Please be sure to bring all of your medications with you to every visit; this includes herbal supplements, vitamins, eye drops, and any over-the-counter medications.   Should you have any questions regarding your medications and/or any new or worsening symptoms, please be sure to call the clinic at [phone].   If you believe that you are suffering from a life threatening condition or one that may result in the loss of limb or function, then you should call 911 or proceed to the nearest Emergency Department.    Pneumonia, Adult Pneumonia is an infection of the lungs. It may be caused by a germ (virus or bacteria). Some types of pneumonia can spread easily from person to person. This can happen when you cough or sneeze. HOME CARE  Only take medicine as told by your doctor.  Take your medicine (antibiotics) as told. Finish it even if you start to feel better.  Do not smoke.  You may use a vaporizer or humidifier in your room. This can help loosen thick spit (mucus).  Sleep so you are almost sitting up (semi-upright). This helps reduce coughing.  Rest. A shot (vaccine) can help prevent pneumonia. Shots are often advised for:  People over 72 years old.  Patients on chemotherapy.  People with long-term (chronic) lung problems.  People with immune system problems. GET HELP RIGHT AWAY IF:   You are getting worse.  You cannot control your cough, and you are losing sleep.  You cough up blood.  Your pain gets worse, even with medicine.  You have a fever.  Any of your problems are getting worse, not better.  You have shortness of breath or chest pain. MAKE  SURE YOU:   Understand these instructions.  Will watch your condition.  Will get help right away if you are not doing well or get worse. Document Released: 11/23/2007 Document Revised: 08/29/2011 Document Reviewed: 08/27/2010 [facility] Patient Information 2015 [place], [place]. This information is not intended to replace advice given to you by your health care provider. Make sure you discuss any questions you have with your health care provider.

## 2014-04-08 NOTE — Telephone Encounter (Signed)
Pt walked in w/ husband actually assisting her in wh/ch, states she has an appt this am but there is no appt in her record. She was to return in 2 weeks for BP f/u from last appt, she went to [facility] last pm and states she was diag with pneumonia, has taken 2 doses of prescribed abx, wants to be seen this am, appt w/ dr gill given for 1045, dr gill and kaye made aware

## 2014-04-08 NOTE — Telephone Encounter (Signed)
Thank you Bonnita Nasuti

## 2014-04-08 NOTE — Assessment & Plan Note (Signed)
-  recheck HIV, given pt high risk status and recurrent PNA

## 2014-04-08 NOTE — Assessment & Plan Note (Addendum)
Pt told to return for a BP recheck and BMP.  She reports compliance with benicar 20mg , metoprolol 25mg , and amlodipine 5mg .  BP today is 143/83.   -check BMP -continue current meds

## 2014-04-08 NOTE — Addendum Note (Signed)
Addended by: Oval Linsey D on: 04/08/2014 02:03 PM   Modules accepted: Level of Service

## 2014-04-08 NOTE — Progress Notes (Signed)
Case discussed with Dr. Gordy Levan soon after the resident saw the patient. We reviewed the resident's history and exam and pertinent patient test results. I agree with the assessment, diagnosis, and plan of care documented in the resident's note.

## 2014-04-08 NOTE — Assessment & Plan Note (Addendum)
Pt seen in ED on 10/19 (yesterday) diagnosed with PNA and given abx of levaquin for 7 days.  Requested to be seen today.  Husband does most of the talking and wants patient to be admitted.  She reports eating and drinking well, O2 sats are 97% on RA, denies any SOB.  Afebrile today.  On exam, I do not appreciate any adventitious sounds.  I do not see a reason for admission as her CURB65 score is 0.   -continue levaquin -repeat CXR in 6 weeks for resolution -check cbc/diff -check HIV given repeated PNA and high risk  -advised to return to clinic if not feeling better after abx or symptoms worsening

## 2014-04-08 NOTE — Progress Notes (Signed)
[name], female   DOB: 1961/05/09, 53 y.o.   MRN: [id number]    Subjective:   [name] female    DOB: 07-16-1960 53 y.o.    MRN: [id number] Health Maintenance Due: Health Maintenance Due  Topic Date Due  . Pap Smear  07/06/2013    _________________________________________________  HPI: [name] is a 53 y.o. female here for an acute visit.  Pt has a PMH outlined below.  Please see problem-based charting assessment and plan note for further details of medical issues addressed at today's visit.  PMH: Past Medical History  Diagnosis Date  . PFO (patent foramen ovale) August 2012    PFO seen on TEE during hospitalization in 01/2011.  Patient to f/u with Dr. Leonie Man, neurology, for enrollment in trial for medical treatment of PFO  . CAD (coronary artery disease) 2007    istory of MI with stent in 2007 by Dr. Chancy Milroy, [facility]. Stent placement by Dr Cleatis Polka   . Prediabetes 03/07/2011  . Hypertension 03/07/2011  . Hyperlipidemia 03/07/2011  . Tobacco abuse 03/07/2011  . Depression 03/07/2011  . Psoriasis 03/07/2011  . Acute right MCA stroke 01/27/11    Acute R MCA stroked  with L arm and leg weakness  . Anxiety 1980s    On Klonipin since age 41. Had addiction problem with Xanax which  Was therefore d/c . Seen in the past at [facility].   . Fractured toe 01/2011    History of left fifth toe proximal phalanx fracture when patient had a stroke (01/2011) and fell. Seen by Dr Doran Durand.   . Incontinence 04/14/2011  . Arthritis 04/16/2011  . Tobacco abuse 03/07/2011    Quit 01/2011   . Fracture of fifth toe, left, closed 01/28/2011    History of left fifth toe proximal phalanx fracture when patient had a stroke (01/2011) and fell. Seen by Dr Doran Durand.    . Substance abuse     h/o narcotic abuse pt denies as of 01/09/12  . Myocardial infarct, old 2008    stents  . Blind right eye   . Pneumonia     Medications: Current Outpatient Prescriptions on File  Prior to Visit  Medication Sig Dispense Refill  . allopurinol (ZYLOPRIM) 300 MG tablet TAKE 1 TABLET BY MOUTH ONCE DAILY  30 tablet  3  . amLODipine (NORVASC) 5 MG tablet TAKE 2 TABLETS BY MOUTH ONCE DAILY  60 tablet  3  . ARIPiprazole (ABILIFY) 5 MG tablet Take 5 mg by mouth at bedtime.      Marland Kitchen atorvastatin (LIPITOR) 10 MG tablet TAKE 1 TABLET BY MOUTH EVERY DAY EVERY 6PM  30 tablet  3  . atropine 1 % ophthalmic solution Place 1 drop into both eyes daily.      . busPIRone (BUSPAR) 10 MG tablet Take 10 mg by mouth daily.      . Carboxymethylcellulose Sodium 1 % GEL Apply 1 drop to eye.      . clonazePAM (KLONOPIN) 1 MG tablet Take 1 mg by mouth 2 (two) times daily.      . clopidogrel (PLAVIX) 75 MG tablet TAKE 1 TABLET BY MOUTH ONCE DAILY  30 tablet  3  . DULoxetine (CYMBALTA) 20 MG capsule       . eszopiclone (LUNESTA) 2 MG TABS tablet TAKE 1 TABLET BY MOUTH AT BEDTIME AS NEEDED FOR SLEEP  30 tablet  0  . levofloxacin (LEVAQUIN) 500 MG tablet Take 1 tablet (500 mg total) by  mouth daily.  7 tablet  0  . metoprolol tartrate (LOPRESSOR) 25 MG tablet TAKE 1 TABLET BY MOUTH TWICE DAILY  60 tablet  3  . [START ON 05/06/2014] nicotine (NICODERM CQ - DOSED IN MG/24 HOURS) 14 mg/24hr patch Place 1 patch (14 mg total) onto the skin daily.  14 patch  0  . nicotine (NICODERM CQ - DOSED IN MG/24 HOURS) 21 mg/24hr patch Place 1 patch (21 mg total) onto the skin daily.  42 patch  0  . [START ON 05/20/2014] nicotine (NICODERM CQ - DOSED IN MG/24 HR) 7 mg/24hr patch Place 1 patch (7 mg total) onto the skin daily.  14 patch  0  . olmesartan (BENICAR) 20 MG tablet Take 1 tablet (20 mg total) by mouth daily.  30 tablet  0  . potassium chloride (MICRO-K) 10 MEQ CR capsule TAKE 2 CAPSULES BY MOUTH DAILY  60 capsule  1  . prednisoLONE acetate (PRED FORTE) 1 % ophthalmic suspension 1 drop 4 (four) times daily.      Marland Kitchen tiZANidine (ZANAFLEX) 4 MG tablet Take 0.5-1 tablets (2-4 mg total) by mouth every 8 (eight) hours as  needed for muscle spasms.  90 tablet  2   No current facility-administered medications on file prior to visit.    Allergies: Allergies  Allergen Reactions  . Azor [Amlodipine-Olmesartan] Other (See Comments)    Drops blood pressure too low (takes plain amlodipine at home July 2015)  . Cozaar [Losartan Potassium]     FH: Family History  Problem Relation Age of Onset  . Heart disease Mother   . Diabetes Mother   . Heart disease Father   . Diabetes Father   . Heart disease Brother   . Diabetes Brother   . Heart disease Maternal Grandmother   . Heart disease Maternal Grandfather   . Heart disease Paternal Grandmother   . Heart disease Paternal Grandfather   . Lung cancer Brother   . Heart disease Brother     SH: History   Social History  . Marital Status: Married    Spouse Name: dexter    Number of Children: 1  . Years of Education: 12   Occupational History  . Disabled    Social History Main Topics  . Smoking status: Former Smoker -- 0.50 packs/day for 30 years    Types: Cigarettes    Quit date: 04/07/2014  . Smokeless tobacco: Never Used     Comment: 1 pack/day  . Alcohol Use: No  . Drug Use: No  . Sexual Activity: Not on file   Other Topics Concern  . Not on file   Social History Narrative   Pt lives at home with sister and brother pt quit smoking 01/2011 pt denies drinking or using any illicit  drugs. Pt is a high school grad.    Review of Systems: Constitutional: Negative for fever, chills and weight loss.  Eyes: Negative for blurred vision.  Respiratory: +cough and -shortness of breath.  Cardiovascular: Negative for chest pain, palpitations and leg swelling.  Gastrointestinal: Negative for nausea, vomiting, abdominal pain, diarrhea, constipation and blood in stool.  Genitourinary: Negative for dysuria, urgency and frequency.  Musculoskeletal: Negative for myalgias and back pain.  Neurological: Negative for dizziness, weakness and headaches.       Objective:   Vital Signs: Filed Vitals:   04/08/14 1055  BP: 142/83  Pulse: 71  Temp: 98.4 F (36.9 C)  TempSrc: Oral  Weight: 204 lb 1.6 oz (92.579 kg)  SpO2: 97%     BP Readings from Last 3 Encounters:  04/08/14 142/83  04/07/14 137/96  03/25/14 147/80    Physical Exam: Constitutional: Vital signs reviewed.  Patient is well-developed and well-nourished in NAD and cooperative with exam.  Head: Normocephalic and atraumatic. Eyes: PERRL, EOMI, conjunctivae nl, no scleral icterus.  Neck: Supple. Cardiovascular: RRR, no MRG. Pulmonary/Chest: normal effort, CTAB, no wheezes, rales, or rhonchi. Abdominal: Soft. NT/ND +BS. Neurological: A&O x3, cranial nerves II-XII are grossly intact, moving all extremities. Extremities: 2+DP b/l; no pitting edema. Skin: Warm, dry and intact.   Assessment & Plan:   Assessment and plan was discussed and formulated with my attending.

## 2014-04-09 LAB — HIV ANTIBODY (ROUTINE TESTING W REFLEX): HIV 1&2 Ab, 4th Generation: NONREACTIVE

## 2014-04-14 ENCOUNTER — Other Ambulatory Visit: Payer: Self-pay | Admitting: Internal Medicine

## 2014-04-24 ENCOUNTER — Other Ambulatory Visit: Payer: Self-pay | Admitting: Internal Medicine

## 2014-04-24 ENCOUNTER — Other Ambulatory Visit: Payer: Self-pay | Admitting: Nurse Practitioner

## 2014-04-29 ENCOUNTER — Telehealth: Payer: Self-pay | Admitting: *Deleted

## 2014-04-29 NOTE — Telephone Encounter (Signed)
Contacted pt's insurance regarding PA for her fenofibrate.  Form will need to be completed by the MD and faxed back to [facility]..  Will place form in pcp's box for completion.[name] Cassady11/10/20154:47 PM     [employer] [phone]  ID# [phone]

## 2014-04-29 NOTE — Telephone Encounter (Signed)
Thank you Ulis Rias

## 2014-05-06 ENCOUNTER — Ambulatory Visit: Payer: Commercial Managed Care - HMO | Admitting: Internal Medicine

## 2014-06-16 ENCOUNTER — Other Ambulatory Visit: Payer: Self-pay | Admitting: Internal Medicine

## 2014-06-16 ENCOUNTER — Other Ambulatory Visit: Payer: Self-pay | Admitting: Nurse Practitioner

## 2014-06-21 DIAGNOSIS — R7881 Bacteremia: Secondary | ICD-10-CM | POA: Diagnosis not present

## 2014-06-21 DIAGNOSIS — M47817 Spondylosis without myelopathy or radiculopathy, lumbosacral region: Secondary | ICD-10-CM | POA: Diagnosis not present

## 2014-06-21 DIAGNOSIS — E8881 Metabolic syndrome: Secondary | ICD-10-CM | POA: Diagnosis not present

## 2014-06-21 DIAGNOSIS — H44009 Unspecified purulent endophthalmitis, unspecified eye: Secondary | ICD-10-CM | POA: Diagnosis not present

## 2014-06-22 DIAGNOSIS — H44009 Unspecified purulent endophthalmitis, unspecified eye: Secondary | ICD-10-CM | POA: Diagnosis not present

## 2014-06-22 DIAGNOSIS — E8881 Metabolic syndrome: Secondary | ICD-10-CM | POA: Diagnosis not present

## 2014-06-22 DIAGNOSIS — R7881 Bacteremia: Secondary | ICD-10-CM | POA: Diagnosis not present

## 2014-06-22 DIAGNOSIS — M47817 Spondylosis without myelopathy or radiculopathy, lumbosacral region: Secondary | ICD-10-CM | POA: Diagnosis not present

## 2014-06-23 DIAGNOSIS — E8881 Metabolic syndrome: Secondary | ICD-10-CM | POA: Diagnosis not present

## 2014-06-23 DIAGNOSIS — H44009 Unspecified purulent endophthalmitis, unspecified eye: Secondary | ICD-10-CM | POA: Diagnosis not present

## 2014-06-23 DIAGNOSIS — R7881 Bacteremia: Secondary | ICD-10-CM | POA: Diagnosis not present

## 2014-06-23 DIAGNOSIS — M47817 Spondylosis without myelopathy or radiculopathy, lumbosacral region: Secondary | ICD-10-CM | POA: Diagnosis not present

## 2014-06-24 DIAGNOSIS — H44009 Unspecified purulent endophthalmitis, unspecified eye: Secondary | ICD-10-CM | POA: Diagnosis not present

## 2014-06-24 DIAGNOSIS — M47817 Spondylosis without myelopathy or radiculopathy, lumbosacral region: Secondary | ICD-10-CM | POA: Diagnosis not present

## 2014-06-24 DIAGNOSIS — R7881 Bacteremia: Secondary | ICD-10-CM | POA: Diagnosis not present

## 2014-06-24 DIAGNOSIS — E8881 Metabolic syndrome: Secondary | ICD-10-CM | POA: Diagnosis not present

## 2014-06-25 DIAGNOSIS — E8881 Metabolic syndrome: Secondary | ICD-10-CM | POA: Diagnosis not present

## 2014-06-25 DIAGNOSIS — R7881 Bacteremia: Secondary | ICD-10-CM | POA: Diagnosis not present

## 2014-06-25 DIAGNOSIS — H44009 Unspecified purulent endophthalmitis, unspecified eye: Secondary | ICD-10-CM | POA: Diagnosis not present

## 2014-06-25 DIAGNOSIS — M47817 Spondylosis without myelopathy or radiculopathy, lumbosacral region: Secondary | ICD-10-CM | POA: Diagnosis not present

## 2014-06-26 DIAGNOSIS — H44009 Unspecified purulent endophthalmitis, unspecified eye: Secondary | ICD-10-CM | POA: Diagnosis not present

## 2014-06-26 DIAGNOSIS — R7881 Bacteremia: Secondary | ICD-10-CM | POA: Diagnosis not present

## 2014-06-26 DIAGNOSIS — M47817 Spondylosis without myelopathy or radiculopathy, lumbosacral region: Secondary | ICD-10-CM | POA: Diagnosis not present

## 2014-06-26 DIAGNOSIS — E8881 Metabolic syndrome: Secondary | ICD-10-CM | POA: Diagnosis not present

## 2014-06-27 ENCOUNTER — Other Ambulatory Visit: Payer: Self-pay | Admitting: *Deleted

## 2014-06-27 DIAGNOSIS — M47817 Spondylosis without myelopathy or radiculopathy, lumbosacral region: Secondary | ICD-10-CM | POA: Diagnosis not present

## 2014-06-27 DIAGNOSIS — E8881 Metabolic syndrome: Secondary | ICD-10-CM | POA: Diagnosis not present

## 2014-06-27 DIAGNOSIS — H44009 Unspecified purulent endophthalmitis, unspecified eye: Secondary | ICD-10-CM | POA: Diagnosis not present

## 2014-06-27 DIAGNOSIS — R7881 Bacteremia: Secondary | ICD-10-CM | POA: Diagnosis not present

## 2014-06-27 NOTE — Telephone Encounter (Signed)
Pt also wants to go back on vesicare, she states she is having severe incontinence issues and does much better with vesicare

## 2014-06-27 NOTE — Telephone Encounter (Signed)
Filled 12/28. Too early

## 2014-06-27 NOTE — Telephone Encounter (Signed)
Please note, pt also ask for vesicare due to severe incontinence

## 2014-06-28 DIAGNOSIS — M47817 Spondylosis without myelopathy or radiculopathy, lumbosacral region: Secondary | ICD-10-CM | POA: Diagnosis not present

## 2014-06-28 DIAGNOSIS — R7881 Bacteremia: Secondary | ICD-10-CM | POA: Diagnosis not present

## 2014-06-28 DIAGNOSIS — H44009 Unspecified purulent endophthalmitis, unspecified eye: Secondary | ICD-10-CM | POA: Diagnosis not present

## 2014-06-28 DIAGNOSIS — E8881 Metabolic syndrome: Secondary | ICD-10-CM | POA: Diagnosis not present

## 2014-06-29 DIAGNOSIS — M47817 Spondylosis without myelopathy or radiculopathy, lumbosacral region: Secondary | ICD-10-CM | POA: Diagnosis not present

## 2014-06-29 DIAGNOSIS — E8881 Metabolic syndrome: Secondary | ICD-10-CM | POA: Diagnosis not present

## 2014-06-29 DIAGNOSIS — R7881 Bacteremia: Secondary | ICD-10-CM | POA: Diagnosis not present

## 2014-06-29 DIAGNOSIS — H44009 Unspecified purulent endophthalmitis, unspecified eye: Secondary | ICD-10-CM | POA: Diagnosis not present

## 2014-06-30 ENCOUNTER — Telehealth: Payer: Self-pay | Admitting: *Deleted

## 2014-06-30 NOTE — Telephone Encounter (Signed)
It appears that Sandra [name] was prescribed vesicare 5 mg daily in 2013-2014 and it was discontinued by Dr. Brue Ludwig in December 2014 secondary to a "change in therapy".  There is no mention of the clinical reasoning for this change in the note from that visit.  [name] also apparently no showed her last appointment in November.  Restarting this medication would be most appropriately done through a formal discussion with her PCP rather than restarting a medication that had been stopped over 1 year ago for unclear reasons, via the phone.  Please schedule [name] with her PCP to discuss the difficulties with the urinary incontinence she is having and the potential reasons why the vesicare was discontinued in the first place to make sure restarting the medication would be appropriate and in her best interests.  Thank You.

## 2014-06-30 NOTE — Telephone Encounter (Signed)
Attempted to schedule appt, lm for rtc

## 2014-06-30 NOTE — Telephone Encounter (Signed)
Pt calls and states she would like to re start vesicare due to the fact she is having increased incontinence, she states it works much better

## 2014-07-16 ENCOUNTER — Other Ambulatory Visit: Payer: Self-pay | Admitting: Internal Medicine

## 2014-07-17 ENCOUNTER — Other Ambulatory Visit: Payer: Self-pay | Admitting: Internal Medicine

## 2014-07-18 NOTE — Telephone Encounter (Signed)
I have never seen this lady, and from notes, we have never prescribed these medications.  Can you discuss with her?  If she gets them from another provider, they will need to be filled at that office.   Thanks

## 2014-07-18 NOTE — Telephone Encounter (Signed)
Please let her know as well that I will likely not take over permanent fill of these Rx until I have seen her in clinic.  Prior to that, she will need to have them filled with her [place] provider, or other provider.  Thanks

## 2014-07-24 ENCOUNTER — Other Ambulatory Visit: Payer: Self-pay | Admitting: Internal Medicine

## 2014-07-24 DIAGNOSIS — Z1231 Encounter for screening mammogram for malignant neoplasm of breast: Secondary | ICD-10-CM

## 2014-07-28 NOTE — Telephone Encounter (Signed)
Called lm, have scheduled appt w/ dr Daryll Drown 2/17 at 0815, need to inform pt for refills she will need to come to appt

## 2014-07-30 ENCOUNTER — Encounter: Payer: Commercial Managed Care - HMO | Admitting: Internal Medicine

## 2014-08-06 ENCOUNTER — Encounter: Payer: Commercial Managed Care - HMO | Admitting: Internal Medicine

## 2014-08-08 ENCOUNTER — Other Ambulatory Visit: Payer: Self-pay | Admitting: Oncology

## 2014-08-11 NOTE — Telephone Encounter (Signed)
His goes to Dr. Beryle Beams in Internal Medicine to be refilled.

## 2014-08-13 ENCOUNTER — Encounter: Payer: Commercial Managed Care - HMO | Admitting: Internal Medicine

## 2014-08-14 ENCOUNTER — Other Ambulatory Visit: Payer: Self-pay | Admitting: Internal Medicine

## 2014-08-16 DIAGNOSIS — H44009 Unspecified purulent endophthalmitis, unspecified eye: Secondary | ICD-10-CM | POA: Diagnosis not present

## 2014-08-17 DIAGNOSIS — H44009 Unspecified purulent endophthalmitis, unspecified eye: Secondary | ICD-10-CM | POA: Diagnosis not present

## 2014-08-19 DIAGNOSIS — H44009 Unspecified purulent endophthalmitis, unspecified eye: Secondary | ICD-10-CM | POA: Diagnosis not present

## 2014-08-20 DIAGNOSIS — H44009 Unspecified purulent endophthalmitis, unspecified eye: Secondary | ICD-10-CM | POA: Diagnosis not present

## 2014-08-21 DIAGNOSIS — H44009 Unspecified purulent endophthalmitis, unspecified eye: Secondary | ICD-10-CM | POA: Diagnosis not present

## 2014-08-22 ENCOUNTER — Ambulatory Visit (INDEPENDENT_AMBULATORY_CARE_PROVIDER_SITE_OTHER): Payer: Commercial Managed Care - HMO | Admitting: Internal Medicine

## 2014-08-22 ENCOUNTER — Encounter: Payer: Self-pay | Admitting: Internal Medicine

## 2014-08-22 VITALS — BP 171/92 | HR 71 | Temp 97.6°F | Ht 65.5 in | Wt 209.8 lb

## 2014-08-22 DIAGNOSIS — E785 Hyperlipidemia, unspecified: Secondary | ICD-10-CM | POA: Diagnosis not present

## 2014-08-22 DIAGNOSIS — E876 Hypokalemia: Secondary | ICD-10-CM | POA: Diagnosis not present

## 2014-08-22 DIAGNOSIS — I10 Essential (primary) hypertension: Secondary | ICD-10-CM

## 2014-08-22 DIAGNOSIS — G4700 Insomnia, unspecified: Secondary | ICD-10-CM

## 2014-08-22 DIAGNOSIS — H44009 Unspecified purulent endophthalmitis, unspecified eye: Secondary | ICD-10-CM | POA: Diagnosis not present

## 2014-08-22 MED ORDER — ESZOPICLONE 2 MG PO TABS: 2.0000 mg | ORAL_TABLET | Freq: Every evening | ORAL | Status: DC | PRN

## 2014-08-22 MED ORDER — METOPROLOL TARTRATE 50 MG PO TABS: 50.0000 mg | ORAL_TABLET | Freq: Two times a day (BID) | ORAL | Status: DC

## 2014-08-22 NOTE — Assessment & Plan Note (Addendum)
Last K in Oct 3.8. Pt on 2mEq KCl replacement due to hypokalemia- unsure why, aldosterone and aldosterone/renin level was actually normal at previous check in July. She has had recent diarrhea and has been taking additional KCl.  - Checking BMP to assess potassium level - Hypokalemia should be further worked up at next visit

## 2014-08-22 NOTE — Patient Instructions (Addendum)
Stop taking the metoprolol 25mg  and begin taking Metoprolol 50mg  2 times a day.   Return in 2 weeks to 1 month for a blood pressure recheck.    General Instructions:   Please bring your medicines with you each time you come to clinic.  Medicines may include prescription medications, over-the-counter medications, herbal remedies, eye drops, vitamins, or other pills.   Progress Toward Treatment Goals:  Treatment Goal 03/25/2014  Blood pressure unchanged  Stop smoking smoking the same amount  Prevent falls -    Self Care Goals & Plans:  Self Care Goal 08/22/2014  Manage my medications take my medicines as prescribed; bring my medications to every visit; refill my medications on time  Monitor my health -  Eat healthy foods eat more vegetables; eat foods that are low in salt; eat baked foods instead of fried foods  Be physically active find an activity I enjoy; take a walk every day  Stop smoking set a quit date and stop smoking  Meeting treatment goals -    No flowsheet data found.   Care Management & Community Referrals:  Referral 11/04/2013  Referrals made for care management support none needed  Referrals made to community resources -

## 2014-08-22 NOTE — Progress Notes (Signed)
Medicine attending: Medical history, presenting problems, physical findings, and medications, reviewed with Dr Christin Bach and I concur with her evaluation and management plan.

## 2014-08-22 NOTE — Assessment & Plan Note (Signed)
BP Readings from Last 3 Encounters:  08/22/14 171/92  04/08/14 142/83  04/07/14 137/96    Lab Results  Component Value Date   NA 141 04/08/2014   K 3.8 04/08/2014   CREATININE 0.80 04/08/2014    Assessment: Blood pressure control: severely elevated Progress toward BP goal:  deteriorated Comments: Pt on amlodipine 10mg  daily, olmsartan 20mg  daily, and metoprolol 25mg  BID  Plan: Medications:  continue current medications but increase Metoprolol to 50mg  BID Other plans: F/u in 2 weeks to 1 mo for a blood pressure recheck

## 2014-08-22 NOTE — Progress Notes (Signed)
Patient ID: Tawana Scale, female   DOB: 1961/02/26, 54 y.o.   MRN: [id number]  Subjective:   Patient ID: LATRESHA YAHR female   DOB: Jun 24, 1960 54 y.o.   MRN: [id number]  HPI: Ms.Kanda ANTOINETT DORMAN is a 54 y.o. F w/ PMH CVA in 2012 (follows with Dr. Leonie Man), endophthalmitis of b/l eyes with blindness in R eye, R CN 3 palsy, CAD s/p MI with BMS on plavix, HTN, HLD presents for a blood pressure check.  She is on amlodipine, benicar, and metoprolol for her HTN and endorses compliance with her meds.   Overall she states that she is doing well and has no complaints. She is requesting a refill of her Lunesta.    Past Medical History  Diagnosis Date  . PFO (patent foramen ovale) August 2012    PFO seen on TEE during hospitalization in 01/2011.  Patient to f/u with Dr. Leonie Man, neurology, for enrollment in trial for medical treatment of PFO  . CAD (coronary artery disease) 2007    istory of MI with stent in 2007 by Dr. Chancy Milroy, [facility]. Stent placement by Dr Cleatis Polka   . Prediabetes 03/07/2011  . Hypertension 03/07/2011  . Hyperlipidemia 03/07/2011  . Tobacco abuse 03/07/2011  . Depression 03/07/2011  . Psoriasis 03/07/2011  . Acute right MCA stroke 01/27/11    Acute R MCA stroked  with L arm and leg weakness  . Anxiety 1980s    On Klonipin since age 12. Had addiction problem with Xanax which  Was therefore d/c . Seen in the past at [facility].   . Fractured toe 01/2011    History of left fifth toe proximal phalanx fracture when patient had a stroke (01/2011) and fell. Seen by Dr Doran Durand.   . Incontinence 04/14/2011  . Arthritis 04/16/2011  . Tobacco abuse 03/07/2011    Quit 01/2011   . Fracture of fifth toe, left, closed 01/28/2011    History of left fifth toe proximal phalanx fracture when patient had a stroke (01/2011) and fell. Seen by Dr Doran Durand.    . Substance abuse     h/o narcotic abuse pt denies as of 01/09/12  . Myocardial infarct, old 2008    stents  . Blind right eye   . Pneumonia     Current Outpatient Prescriptions  Medication Sig Dispense Refill  . allopurinol (ZYLOPRIM) 300 MG tablet TAKE 1 TABLET BY MOUTH EVERY DAY 30 tablet 3  . amLODipine (NORVASC) 5 MG tablet TAKE 2 TABLET BY MOUTH EVERY DAY 60 tablet 3  . ARIPiprazole (ABILIFY) 5 MG tablet Take 5 mg by mouth at bedtime.    Marland Kitchen atorvastatin (LIPITOR) 10 MG tablet TAKE 1 TABLET BY MOUTH EVERY DAY AT 6PM 30 tablet 3  . atropine 1 % ophthalmic solution Place 1 drop into both eyes daily.    Marland Kitchen BENICAR 20 MG tablet TAKE 1 TABLET BY MOUTH EVERY DAY 30 tablet 1  . busPIRone (BUSPAR) 10 MG tablet Take 10 mg by mouth daily.    . Carboxymethylcellulose Sodium 1 % GEL Apply 1 drop to eye.    . clonazePAM (KLONOPIN) 1 MG tablet Take 1 mg by mouth 2 (two) times daily.    . clopidogrel (PLAVIX) 75 MG tablet TAKE 1 TABLET BY MOUTH EVERY DAY 30 tablet 3  . DULoxetine (CYMBALTA) 20 MG capsule     . eszopiclone (LUNESTA) 2 MG TABS tablet TAKE 1 TABLET BY MOUTH AT BEDTIME AS NEEDED FOR SLEEP 5 tablet 0  .  levofloxacin (LEVAQUIN) 500 MG tablet Take 1 tablet (500 mg total) by mouth daily. 7 tablet 0  . LIPOFEN 50 MG CAPS TAKE 1 CAPSULE BY MOUTH EVERY DAY 30 capsule 2  . metoprolol tartrate (LOPRESSOR) 25 MG tablet TAKE 1 TABLET BY MOUTH TWICE DAILY 60 tablet 3  . potassium chloride (MICRO-K) 10 MEQ CR capsule TAKE 2 CAPSULES BY MOUTH DAILY 60 capsule 1  . prednisoLONE acetate (PRED FORTE) 1 % ophthalmic suspension 1 drop 4 (four) times daily.    Marland Kitchen tiZANidine (ZANAFLEX) 4 MG tablet TAKE 1/2 TO 1 TABLET BY MOUTH EVERY 8 HOURS AS NEEDED FOR MUSCLE SPASMS 270 tablet 2   No current facility-administered medications for this visit.   Family History  Problem Relation Age of Onset  . Heart disease Mother   . Diabetes Mother   . Heart disease Father   . Diabetes Father   . Heart disease Brother   . Diabetes Brother   . Heart disease Maternal Grandmother   . Heart disease Maternal Grandfather   . Heart disease Paternal Grandmother   .  Heart disease Paternal Grandfather   . Lung cancer Brother   . Heart disease Brother    History   Social History  . Marital Status: Married    Spouse Name: dexter  . Number of Children: 1  . Years of Education: 12   Occupational History  . Disabled    Social History Main Topics  . Smoking status: Current Some Day Smoker -- 0.20 packs/day for 30 years    Types: Cigarettes    Last Attempt to Quit: 04/07/2014  . Smokeless tobacco: Never Used     Comment: Trying to quit again.  . Alcohol Use: No  . Drug Use: No  . Sexual Activity: Not on file   Other Topics Concern  . None   Social History Narrative   Pt lives at home with sister and brother pt quit smoking 01/2011 pt denies drinking or using any illicit  drugs. Pt is a high school grad.   Review of Systems: Denies fevers, chills, vision changes, headaches, chest pain, SOB, abd pain, N/V, trouble or pain with urination. She endorses R eye blindness after infection, residual left sided weakness s/p CVA, and diarrhea last week that has resolved.   Objective:  Physical Exam: Filed Vitals:   08/22/14 1553  BP: 172/77  Pulse: 72  Temp: 97.6 F (36.4 C)  TempSrc: Oral  Height: 5' 5.5" (1.664 m)  Weight: 209 lb 12.8 oz (95.165 kg)  SpO2: 98%   Constitutional: Vital signs reviewed.  Patient is a well-developed and well-nourished female in no acute distress and cooperative with exam. Alert and oriented x3.  Head: Normocephalic and atraumatic Eyes: EOMI. R eye with sclerotic appearance.  Neck: Supple, Trachea midline, normal ROM, No JVD, mass, thyromegaly.  Cardiovascular: RRR, no MRG, pulses symmetric and intact bilaterally Pulmonary/Chest: Normal respiratory effort, CTAB, no wheezes, rales, or rhonchi Abdominal: Soft. Non-tender, non-distended, bowel sounds are normal Musculoskeletal: No joint deformities or stiffness, ROM full Hematology: No cervical adenopathy.  Neurological: A&O x3, Strength is normal and symmetric  bilaterally in LE, 5/5 in RUE and 4/5 in LUE, cranial nerve II-XII are grossly intact. Skin: Warm, dry and intact. No rash aprreciable.  Psychiatric: Normal mood and affect. Speech and behavior is normal.   Assessment & Plan:   Please refer to Problem List based Assessment and Plan

## 2014-08-22 NOTE — Assessment & Plan Note (Addendum)
Refilled her Lunesta today with #30 tablets, 2 refills. Rx given to the patient.

## 2014-08-22 NOTE — Assessment & Plan Note (Signed)
Pt on a statin and fibrate. She is due for a lipid panel.  - Checking lipid panel

## 2014-08-23 DIAGNOSIS — H44009 Unspecified purulent endophthalmitis, unspecified eye: Secondary | ICD-10-CM | POA: Diagnosis not present

## 2014-08-23 LAB — LIPID PANEL
CHOL/HDL RATIO: 5 ratio
Cholesterol: 150 mg/dL (ref 0–200)
HDL: 30 mg/dL — ABNORMAL LOW (ref >=46)
LDL Cholesterol: 90 mg/dL (ref 0–99)
Triglycerides: 150 mg/dL — ABNORMAL HIGH (ref <150)
VLDL: 30 mg/dL (ref 0–40)

## 2014-08-23 LAB — BASIC METABOLIC PANEL WITH GFR
BUN: 9 mg/dL (ref 6–23)
CALCIUM: 9.5 mg/dL (ref 8.4–10.5)
CHLORIDE: 107 meq/L (ref 96–112)
CO2: 21 mEq/L (ref 19–32)
CREATININE: 0.72 mg/dL (ref 0.50–1.10)
GFR, Est African American: >89 mL/min
GFR, Est Non African American: >89 mL/min
Glucose, Bld: 86 mg/dL (ref 70–99)
POTASSIUM: 4.4 meq/L (ref 3.5–5.3)
Sodium: 143 mEq/L (ref 135–145)

## 2014-08-24 DIAGNOSIS — H44009 Unspecified purulent endophthalmitis, unspecified eye: Secondary | ICD-10-CM | POA: Diagnosis not present

## 2014-08-25 DIAGNOSIS — H44009 Unspecified purulent endophthalmitis, unspecified eye: Secondary | ICD-10-CM | POA: Diagnosis not present

## 2014-08-26 DIAGNOSIS — H44009 Unspecified purulent endophthalmitis, unspecified eye: Secondary | ICD-10-CM | POA: Diagnosis not present

## 2014-08-27 ENCOUNTER — Ambulatory Visit (HOSPITAL_COMMUNITY)
Admission: RE | Admit: 2014-08-27 | Discharge: 2014-08-27 | Disposition: A | Discharge status: Home / Self Care | Payer: Commercial Managed Care - HMO | Source: Ambulatory Visit | Attending: Internal Medicine | Admitting: Internal Medicine

## 2014-08-27 ENCOUNTER — Ambulatory Visit (HOSPITAL_COMMUNITY): Payer: Commercial Managed Care - HMO

## 2014-08-27 DIAGNOSIS — Z1231 Encounter for screening mammogram for malignant neoplasm of breast: Secondary | ICD-10-CM | POA: Diagnosis not present

## 2014-08-27 DIAGNOSIS — H44009 Unspecified purulent endophthalmitis, unspecified eye: Secondary | ICD-10-CM | POA: Diagnosis not present

## 2014-08-28 DIAGNOSIS — H44009 Unspecified purulent endophthalmitis, unspecified eye: Secondary | ICD-10-CM | POA: Diagnosis not present

## 2014-08-29 DIAGNOSIS — H44009 Unspecified purulent endophthalmitis, unspecified eye: Secondary | ICD-10-CM | POA: Diagnosis not present

## 2014-08-30 DIAGNOSIS — H44009 Unspecified purulent endophthalmitis, unspecified eye: Secondary | ICD-10-CM | POA: Diagnosis not present

## 2014-08-31 DIAGNOSIS — H44009 Unspecified purulent endophthalmitis, unspecified eye: Secondary | ICD-10-CM | POA: Diagnosis not present

## 2014-09-01 DIAGNOSIS — H44009 Unspecified purulent endophthalmitis, unspecified eye: Secondary | ICD-10-CM | POA: Diagnosis not present

## 2014-09-02 DIAGNOSIS — H44009 Unspecified purulent endophthalmitis, unspecified eye: Secondary | ICD-10-CM | POA: Diagnosis not present

## 2014-09-03 DIAGNOSIS — H44009 Unspecified purulent endophthalmitis, unspecified eye: Secondary | ICD-10-CM | POA: Diagnosis not present

## 2014-09-04 ENCOUNTER — Ambulatory Visit: Payer: Commercial Managed Care - HMO | Admitting: Internal Medicine

## 2014-09-04 ENCOUNTER — Telehealth: Payer: Self-pay | Admitting: *Deleted

## 2014-09-04 DIAGNOSIS — H44009 Unspecified purulent endophthalmitis, unspecified eye: Secondary | ICD-10-CM | POA: Diagnosis not present

## 2014-09-04 NOTE — Telephone Encounter (Signed)
Pt called to check results mammogram 08/27/14 - [facility] told pt to call her doctor. Talked with Dr Daryll Drown - WNL - no findings suspicious for malignancy. Hilda Blades Shanvi Moyd RN 09/04/14 11AM

## 2014-09-05 DIAGNOSIS — H44009 Unspecified purulent endophthalmitis, unspecified eye: Secondary | ICD-10-CM | POA: Diagnosis not present

## 2014-09-06 DIAGNOSIS — H44009 Unspecified purulent endophthalmitis, unspecified eye: Secondary | ICD-10-CM | POA: Diagnosis not present

## 2014-09-07 DIAGNOSIS — H44009 Unspecified purulent endophthalmitis, unspecified eye: Secondary | ICD-10-CM | POA: Diagnosis not present

## 2014-09-08 DIAGNOSIS — H44009 Unspecified purulent endophthalmitis, unspecified eye: Secondary | ICD-10-CM | POA: Diagnosis not present

## 2014-09-09 DIAGNOSIS — H44009 Unspecified purulent endophthalmitis, unspecified eye: Secondary | ICD-10-CM | POA: Diagnosis not present

## 2014-09-10 DIAGNOSIS — H44009 Unspecified purulent endophthalmitis, unspecified eye: Secondary | ICD-10-CM | POA: Diagnosis not present

## 2014-09-11 ENCOUNTER — Other Ambulatory Visit: Payer: Self-pay | Admitting: Internal Medicine

## 2014-09-11 DIAGNOSIS — H44009 Unspecified purulent endophthalmitis, unspecified eye: Secondary | ICD-10-CM | POA: Diagnosis not present

## 2014-09-11 DIAGNOSIS — I10 Essential (primary) hypertension: Secondary | ICD-10-CM

## 2014-09-11 DIAGNOSIS — E876 Hypokalemia: Secondary | ICD-10-CM

## 2014-09-12 DIAGNOSIS — H44009 Unspecified purulent endophthalmitis, unspecified eye: Secondary | ICD-10-CM | POA: Diagnosis not present

## 2014-09-13 DIAGNOSIS — H44009 Unspecified purulent endophthalmitis, unspecified eye: Secondary | ICD-10-CM | POA: Diagnosis not present

## 2014-09-14 DIAGNOSIS — H44009 Unspecified purulent endophthalmitis, unspecified eye: Secondary | ICD-10-CM | POA: Diagnosis not present

## 2014-09-15 ENCOUNTER — Other Ambulatory Visit: Payer: Self-pay | Admitting: Internal Medicine

## 2014-09-15 DIAGNOSIS — H44009 Unspecified purulent endophthalmitis, unspecified eye: Secondary | ICD-10-CM | POA: Diagnosis not present

## 2014-09-16 DIAGNOSIS — H44009 Unspecified purulent endophthalmitis, unspecified eye: Secondary | ICD-10-CM | POA: Diagnosis not present

## 2014-09-17 DIAGNOSIS — H44009 Unspecified purulent endophthalmitis, unspecified eye: Secondary | ICD-10-CM | POA: Diagnosis not present

## 2014-09-18 DIAGNOSIS — H44009 Unspecified purulent endophthalmitis, unspecified eye: Secondary | ICD-10-CM | POA: Diagnosis not present

## 2014-09-19 DIAGNOSIS — H44009 Unspecified purulent endophthalmitis, unspecified eye: Secondary | ICD-10-CM | POA: Diagnosis not present

## 2014-09-20 DIAGNOSIS — H44009 Unspecified purulent endophthalmitis, unspecified eye: Secondary | ICD-10-CM | POA: Diagnosis not present

## 2014-09-21 DIAGNOSIS — H44009 Unspecified purulent endophthalmitis, unspecified eye: Secondary | ICD-10-CM | POA: Diagnosis not present

## 2014-09-22 DIAGNOSIS — H44009 Unspecified purulent endophthalmitis, unspecified eye: Secondary | ICD-10-CM | POA: Diagnosis not present

## 2014-09-23 DIAGNOSIS — H44009 Unspecified purulent endophthalmitis, unspecified eye: Secondary | ICD-10-CM | POA: Diagnosis not present

## 2014-09-24 DIAGNOSIS — H44009 Unspecified purulent endophthalmitis, unspecified eye: Secondary | ICD-10-CM | POA: Diagnosis not present

## 2014-09-25 DIAGNOSIS — H44009 Unspecified purulent endophthalmitis, unspecified eye: Secondary | ICD-10-CM | POA: Diagnosis not present

## 2014-09-26 DIAGNOSIS — H44009 Unspecified purulent endophthalmitis, unspecified eye: Secondary | ICD-10-CM | POA: Diagnosis not present

## 2014-09-27 DIAGNOSIS — H44009 Unspecified purulent endophthalmitis, unspecified eye: Secondary | ICD-10-CM | POA: Diagnosis not present

## 2014-09-28 DIAGNOSIS — H44009 Unspecified purulent endophthalmitis, unspecified eye: Secondary | ICD-10-CM | POA: Diagnosis not present

## 2014-09-29 DIAGNOSIS — H44009 Unspecified purulent endophthalmitis, unspecified eye: Secondary | ICD-10-CM | POA: Diagnosis not present

## 2014-09-30 DIAGNOSIS — H44009 Unspecified purulent endophthalmitis, unspecified eye: Secondary | ICD-10-CM | POA: Diagnosis not present

## 2014-10-01 DIAGNOSIS — H44009 Unspecified purulent endophthalmitis, unspecified eye: Secondary | ICD-10-CM | POA: Diagnosis not present

## 2014-10-02 DIAGNOSIS — H44009 Unspecified purulent endophthalmitis, unspecified eye: Secondary | ICD-10-CM | POA: Diagnosis not present

## 2014-10-03 DIAGNOSIS — H44009 Unspecified purulent endophthalmitis, unspecified eye: Secondary | ICD-10-CM | POA: Diagnosis not present

## 2014-10-04 DIAGNOSIS — H44009 Unspecified purulent endophthalmitis, unspecified eye: Secondary | ICD-10-CM | POA: Diagnosis not present

## 2014-10-05 DIAGNOSIS — H44009 Unspecified purulent endophthalmitis, unspecified eye: Secondary | ICD-10-CM | POA: Diagnosis not present

## 2014-10-06 DIAGNOSIS — H44009 Unspecified purulent endophthalmitis, unspecified eye: Secondary | ICD-10-CM | POA: Diagnosis not present

## 2014-10-07 DIAGNOSIS — H44009 Unspecified purulent endophthalmitis, unspecified eye: Secondary | ICD-10-CM | POA: Diagnosis not present

## 2014-10-08 DIAGNOSIS — H44009 Unspecified purulent endophthalmitis, unspecified eye: Secondary | ICD-10-CM | POA: Diagnosis not present

## 2014-10-09 ENCOUNTER — Other Ambulatory Visit: Payer: Self-pay | Admitting: Oncology

## 2014-10-09 DIAGNOSIS — H44009 Unspecified purulent endophthalmitis, unspecified eye: Secondary | ICD-10-CM | POA: Diagnosis not present

## 2014-10-10 DIAGNOSIS — H44009 Unspecified purulent endophthalmitis, unspecified eye: Secondary | ICD-10-CM | POA: Diagnosis not present

## 2014-10-10 NOTE — Telephone Encounter (Signed)
Last seen March. BP high. Asked to F/U 2 weeks. Cancelled or no showed 7 appts since Oct. One month supply only. Needs to make and keep appt within next 30 days to remain [facility] pt. If she needs help with transportation / cost of appts, pls refer pt to [facility].

## 2014-10-11 DIAGNOSIS — H44009 Unspecified purulent endophthalmitis, unspecified eye: Secondary | ICD-10-CM | POA: Diagnosis not present

## 2014-10-12 DIAGNOSIS — H44009 Unspecified purulent endophthalmitis, unspecified eye: Secondary | ICD-10-CM | POA: Diagnosis not present

## 2014-10-13 DIAGNOSIS — H44009 Unspecified purulent endophthalmitis, unspecified eye: Secondary | ICD-10-CM | POA: Diagnosis not present

## 2014-10-14 DIAGNOSIS — H44009 Unspecified purulent endophthalmitis, unspecified eye: Secondary | ICD-10-CM | POA: Diagnosis not present

## 2014-10-15 ENCOUNTER — Encounter: Payer: Self-pay | Admitting: Internal Medicine

## 2014-10-15 DIAGNOSIS — H44009 Unspecified purulent endophthalmitis, unspecified eye: Secondary | ICD-10-CM | POA: Diagnosis not present

## 2014-10-16 DIAGNOSIS — H44009 Unspecified purulent endophthalmitis, unspecified eye: Secondary | ICD-10-CM | POA: Diagnosis not present

## 2014-10-17 DIAGNOSIS — H44009 Unspecified purulent endophthalmitis, unspecified eye: Secondary | ICD-10-CM | POA: Diagnosis not present

## 2014-10-18 DIAGNOSIS — H44009 Unspecified purulent endophthalmitis, unspecified eye: Secondary | ICD-10-CM | POA: Diagnosis not present

## 2014-11-06 ENCOUNTER — Other Ambulatory Visit: Payer: Self-pay | Admitting: Internal Medicine

## 2014-11-16 ENCOUNTER — Encounter (HOSPITAL_BASED_OUTPATIENT_CLINIC_OR_DEPARTMENT_OTHER): Payer: Self-pay | Admitting: *Deleted

## 2014-11-16 ENCOUNTER — Emergency Department (HOSPITAL_BASED_OUTPATIENT_CLINIC_OR_DEPARTMENT_OTHER)
Admission: EM | Admit: 2014-11-16 | Discharge: 2014-11-16 | Disposition: A | Discharge status: Home / Self Care | Payer: Commercial Managed Care - HMO | Attending: Emergency Medicine | Admitting: Emergency Medicine

## 2014-11-16 DIAGNOSIS — Z720 Tobacco use: Secondary | ICD-10-CM | POA: Insufficient documentation

## 2014-11-16 DIAGNOSIS — H5441 Blindness, right eye, normal vision left eye: Secondary | ICD-10-CM | POA: Diagnosis not present

## 2014-11-16 DIAGNOSIS — Z7952 Long term (current) use of systemic steroids: Secondary | ICD-10-CM | POA: Insufficient documentation

## 2014-11-16 DIAGNOSIS — Z79899 Other long term (current) drug therapy: Secondary | ICD-10-CM | POA: Diagnosis not present

## 2014-11-16 DIAGNOSIS — Z872 Personal history of diseases of the skin and subcutaneous tissue: Secondary | ICD-10-CM | POA: Diagnosis not present

## 2014-11-16 DIAGNOSIS — Z9861 Coronary angioplasty status: Secondary | ICD-10-CM | POA: Insufficient documentation

## 2014-11-16 DIAGNOSIS — Q211 Atrial septal defect: Secondary | ICD-10-CM | POA: Insufficient documentation

## 2014-11-16 DIAGNOSIS — I10 Essential (primary) hypertension: Secondary | ICD-10-CM | POA: Diagnosis not present

## 2014-11-16 DIAGNOSIS — F419 Anxiety disorder, unspecified: Secondary | ICD-10-CM | POA: Insufficient documentation

## 2014-11-16 DIAGNOSIS — L0291 Cutaneous abscess, unspecified: Secondary | ICD-10-CM

## 2014-11-16 DIAGNOSIS — L02211 Cutaneous abscess of abdominal wall: Secondary | ICD-10-CM | POA: Diagnosis not present

## 2014-11-16 DIAGNOSIS — Z8781 Personal history of (healed) traumatic fracture: Secondary | ICD-10-CM | POA: Insufficient documentation

## 2014-11-16 DIAGNOSIS — M1990 Unspecified osteoarthritis, unspecified site: Secondary | ICD-10-CM | POA: Diagnosis not present

## 2014-11-16 DIAGNOSIS — F329 Major depressive disorder, single episode, unspecified: Secondary | ICD-10-CM | POA: Insufficient documentation

## 2014-11-16 DIAGNOSIS — I2510 Atherosclerotic heart disease of native coronary artery without angina pectoris: Secondary | ICD-10-CM | POA: Diagnosis not present

## 2014-11-16 DIAGNOSIS — Z8673 Personal history of transient ischemic attack (TIA), and cerebral infarction without residual deficits: Secondary | ICD-10-CM | POA: Diagnosis not present

## 2014-11-16 DIAGNOSIS — I252 Old myocardial infarction: Secondary | ICD-10-CM | POA: Diagnosis not present

## 2014-11-16 DIAGNOSIS — Z7902 Long term (current) use of antithrombotics/antiplatelets: Secondary | ICD-10-CM | POA: Diagnosis not present

## 2014-11-16 DIAGNOSIS — E785 Hyperlipidemia, unspecified: Secondary | ICD-10-CM | POA: Diagnosis not present

## 2014-11-16 DIAGNOSIS — Z8701 Personal history of pneumonia (recurrent): Secondary | ICD-10-CM | POA: Insufficient documentation

## 2014-11-16 MED ORDER — LIDOCAINE-EPINEPHRINE 2 %-1:100000 IJ SOLN
20.0000 mL | Freq: Once | INTRAMUSCULAR | Status: AC
  Administered 2014-11-16: 20 mL
  Filled 2014-11-16: qty 1

## 2014-11-16 MED ORDER — SULFAMETHOXAZOLE-TRIMETHOPRIM 800-160 MG PO TABS: 1.0000 | ORAL_TABLET | Freq: Two times a day (BID) | ORAL | Status: AC

## 2014-11-16 NOTE — Discharge Instructions (Signed)
Abscess Care After An abscess (also called a boil or furuncle) is an infected area that contains a collection of pus. Signs and symptoms of an abscess include pain, tenderness, redness, or hardness, or you may feel a moveable soft area under your skin. An abscess can occur anywhere in the body. The infection may spread to surrounding tissues causing cellulitis. A cut (incision) by the surgeon was made over your abscess and the pus was drained out. Gauze may have been packed into the space to provide a drain that will allow the cavity to heal from the inside outwards. The boil may be painful for 5 to 7 days. Most people with a boil do not have high fevers. Your abscess, if seen early, may not have localized, and may not have been lanced. If not, another appointment may be required for this if it does not get better on its own or with medications. HOME CARE INSTRUCTIONS   Only take over-the-counter or prescription medicines for pain, discomfort, or fever as directed by your caregiver.  When you bathe, soak and then remove gauze or iodoform packs at least daily or as directed by your caregiver. You may then wash the wound gently with mild soapy water. Repack with gauze or do as your caregiver directs. SEEK IMMEDIATE MEDICAL CARE IF:   You develop increased pain, swelling, redness, drainage, or bleeding in the wound site.  You develop signs of generalized infection including muscle aches, chills, fever, or a general ill feeling.  An oral temperature above 102 F (38.9 C) develops, not controlled by medication. See your caregiver for a recheck if you develop any of the symptoms described above. If medications (antibiotics) were prescribed, take them as directed. Document Released: 12/23/2004 Document Revised: 08/29/2011 Document Reviewed: 08/20/2007 [facility] Patient Information 2015 [place], [place]. This information is not intended to replace advice given to you by your health care provider. Make sure  you discuss any questions you have with your health care provider.  Abscess An abscess (boil or furuncle) is an infected area on or under the skin. This area is filled with yellowish-white fluid (pus) and other material (debris). HOME CARE   Only take medicines as told by your doctor.  If you were given antibiotic medicine, take it as directed. Finish the medicine even if you start to feel better.  If gauze is used, follow your doctor's directions for changing the gauze.  To avoid spreading the infection:  Keep your abscess covered with a bandage.  Wash your hands well.  Do not share personal care items, towels, or whirlpools with others.  Avoid skin contact with others.  Keep your skin and clothes clean around the abscess.  Keep all doctor visits as told. GET HELP RIGHT AWAY IF:   You have more pain, puffiness (swelling), or redness in the wound site.  You have more fluid or blood coming from the wound site.  You have muscle aches, chills, or you feel sick.  You have a fever. MAKE SURE YOU:   Understand these instructions.  Will watch your condition.  Will get help right away if you are not doing well or get worse. Document Released: 11/23/2007 Document Revised: 12/06/2011 Document Reviewed: 08/19/2011 [facility] Patient Information 2015 [place], [place]. This information is not intended to replace advice given to you by your health care provider. Make sure you discuss any questions you have with your health care provider.

## 2014-11-16 NOTE — ED Notes (Signed)
Abscess right lower abdomen

## 2014-11-16 NOTE — ED Provider Notes (Signed)
CSN: [id number]     Arrival date & time 11/16/14  8921 History   First MD Initiated Contact with Patient 11/16/14 [phone]     Chief Complaint  Patient presents with  . Abscess     (Consider location/radiation/quality/duration/timing/severity/associated sxs/prior Treatment) Patient is a 54 y.o. female presenting with abscess. The history is provided by the patient.  Abscess Location:  Torso Torso abscess location:  Abd RUQ Abscess quality: draining, fluctuance, induration and painful   Red streaking: no   Duration:  3 days Progression:  Worsening Pain details:    Quality:  Sharp   Severity:  Mild   Timing:  Constant   Progression:  Worsening Chronicity:  Recurrent Context: not diabetes and not injected drug use   Relieved by:  Draining/squeezing Worsened by:  Nothing tried Ineffective treatments:  None tried Associated symptoms: no fever   Risk factors: prior abscess     Past Medical History  Diagnosis Date  . PFO (patent foramen ovale) August 2012    PFO seen on TEE during hospitalization in 01/2011.  Patient to f/u with Dr. Leonie Man, neurology, for enrollment in trial for medical treatment of PFO  . CAD (coronary artery disease) 2007    istory of MI with stent in 2007 by Dr. Chancy Milroy, [facility]. Stent placement by Dr Cleatis Polka   . Prediabetes 03/07/2011  . Hypertension 03/07/2011  . Hyperlipidemia 03/07/2011  . Tobacco abuse 03/07/2011  . Depression 03/07/2011  . Psoriasis 03/07/2011  . Acute right MCA stroke 01/27/11    Acute R MCA stroked  with L arm and leg weakness  . Anxiety 1980s    On Klonipin since age 67. Had addiction problem with Xanax which  Was therefore d/c . Seen in the past at [facility].   . Fractured toe 01/2011    History of left fifth toe proximal phalanx fracture when patient had a stroke (01/2011) and fell. Seen by Dr Doran Durand.   . Incontinence 04/14/2011  . Arthritis 04/16/2011  . Tobacco abuse 03/07/2011    Quit 01/2011   . Fracture of fifth toe,  left, closed 01/28/2011    History of left fifth toe proximal phalanx fracture when patient had a stroke (01/2011) and fell. Seen by Dr Doran Durand.    . Substance abuse     h/o narcotic abuse pt denies as of 01/09/12  . Myocardial infarct, old 2008    stents  . Blind right eye   . Pneumonia    Past Surgical History  Procedure Laterality Date  . Cardiac stent      2008  . Eye surgery     Family History  Problem Relation Age of Onset  . Heart disease Mother   . Diabetes Mother   . Heart disease Father   . Diabetes Father   . Heart disease Brother   . Diabetes Brother   . Heart disease Maternal Grandmother   . Heart disease Maternal Grandfather   . Heart disease Paternal Grandmother   . Heart disease Paternal Grandfather   . Lung cancer Brother   . Heart disease Brother    History  Substance Use Topics  . Smoking status: Current Some Day Smoker -- 0.20 packs/day for 30 years    Types: Cigarettes    Last Attempt to Quit: 04/07/2014  . Smokeless tobacco: Never Used     Comment: Trying to quit again.  . Alcohol Use: No   OB History    No data available     Review  of Systems  Constitutional: Negative for fever.  All other systems reviewed and are negative.     Allergies  Azor and Cozaar  Home Medications   Prior to Admission medications   Medication Sig Start Date End Date Taking? Authorizing Provider  allopurinol (ZYLOPRIM) 300 MG tablet TAKE 1 TABLET BY MOUTH EVERY DAY 11/07/14   Sid Falcon, MD  amLODipine (NORVASC) 5 MG tablet TAKE 2 TABLETS BY MOUTH EVERY DAY 11/07/14   Sid Falcon, MD  ARIPiprazole (ABILIFY) 5 MG tablet Take 5 mg by mouth at bedtime.    Historical Provider, MD  atorvastatin (LIPITOR) 10 MG tablet TAKE 1 TABLET BY MOUTH EVERY DAY AT [facility] 11/07/14   Sid Falcon, MD  busPIRone (BUSPAR) 10 MG tablet Take 10 mg by mouth daily.    Historical Provider, MD  Carboxymethylcellulose Sodium 1 % GEL Apply 1 drop to eye. 03/14/14   Historical Provider, MD    clonazePAM (KLONOPIN) 1 MG tablet Take 1 mg by mouth 2 (two) times daily.    Historical Provider, MD  clopidogrel (PLAVIX) 75 MG tablet TAKE 1 TABLET BY MOUTH EVERY DAY 11/07/14   Sid Falcon, MD  DULoxetine (CYMBALTA) 20 MG capsule  12/20/13   Historical Provider, MD  eszopiclone (LUNESTA) 2 MG TABS tablet Take 1 tablet (2 mg total) by mouth at bedtime as needed. for sleep 08/22/14   Otho Bellows, MD  LIPOFEN 50 MG CAPS TAKE 1 CAPSULE BY MOUTH EVERY DAY 08/15/14   Sid Falcon, MD  metoprolol (LOPRESSOR) 50 MG tablet TAKE 1 TABLET BY MOUTH TWICE DAILY 11/07/14   Sid Falcon, MD  olmesartan (BENICAR) 20 MG tablet Take 1 tablet (20 mg total) by mouth daily. 09/15/14   Oval Linsey, MD  potassium chloride (MICRO-K) 10 MEQ CR capsule Take 2 capsules (20 mEq total) by mouth daily. 09/15/14   Oval Linsey, MD  prednisoLONE acetate (PRED FORTE) 1 % ophthalmic suspension 1 drop 4 (four) times daily.    Historical Provider, MD  tiZANidine (ZANAFLEX) 4 MG tablet TAKE 1/2 TO 1 TABLET BY MOUTH EVERY 8 HOURS AS NEEDED FOR MUSCLE SPASMS 06/16/14   Garvin Fila, MD   BP 179/84 mmHg  Pulse 72  Temp(Src) 98 F (36.7 C) (Oral)  Resp 20  Ht 5' 5"  (1.651 m)  Wt 205 lb (92.987 kg)  BMI 34.11 kg/m2  SpO2 98% Physical Exam  Constitutional: She is oriented to person, place, and time. She appears well-developed and well-nourished. No distress.  HENT:  Head: Normocephalic and atraumatic.  Mouth/Throat: Oropharynx is clear and moist.  Eyes: Conjunctivae and EOM are normal. Pupils are equal, round, and reactive to light.  Neck: Normal range of motion. Neck supple.  Cardiovascular: Normal rate.   Pulmonary/Chest: Effort normal.  Musculoskeletal: Normal range of motion. She exhibits no edema or tenderness.  Neurological: She is alert and oriented to person, place, and time.  Skin: Skin is warm and dry. There is erythema.     Psychiatric: She has a normal mood and affect. Her behavior is normal.   Nursing note and vitals reviewed.   ED Course  Procedures (including critical care time) Labs Review Labs Reviewed - No data to display  Imaging Review No results found.   EKG Interpretation None      INCISION AND DRAINAGE Performed by: Blanchie Dessert Consent: Verbal consent obtained. Risks and benefits: risks, benefits and alternatives were discussed Type: abscess  Body area: right upper abdomen  Anesthesia: local  infiltration  Incision was made with a scalpel.  Local anesthetic: lidocaine 2% with epinephrine  Anesthetic total: 1 ml  Complexity: complex Blunt dissection to break up loculations  Drainage: purulent  Drainage amount:75m  Packing material: none  Patient tolerance: Patient tolerated the procedure well with no immediate complications.     MDM   Final diagnoses:  Abscess    Pt with small abscess with surrounding cellulitis.  No systemic sx.  I&D as above.  Mild cellulitis.  Pt started on bactrim.    WBlanchie Dessert MD 11/16/14 [phone]

## 2014-11-28 ENCOUNTER — Other Ambulatory Visit: Payer: Self-pay | Admitting: Neurology

## 2014-11-28 NOTE — Telephone Encounter (Signed)
Has appt scheduled.  Med was prescribed at last OV by Jeani Hawking

## 2014-12-03 DIAGNOSIS — F419 Anxiety disorder, unspecified: Secondary | ICD-10-CM | POA: Diagnosis not present

## 2014-12-03 DIAGNOSIS — F341 Dysthymic disorder: Secondary | ICD-10-CM | POA: Diagnosis not present

## 2014-12-09 DIAGNOSIS — H26061 Combined forms of infantile and juvenile cataract, right eye: Secondary | ICD-10-CM | POA: Diagnosis not present

## 2014-12-09 DIAGNOSIS — H44003 Unspecified purulent endophthalmitis, bilateral: Secondary | ICD-10-CM | POA: Diagnosis not present

## 2014-12-09 DIAGNOSIS — H35032 Hypertensive retinopathy, left eye: Secondary | ICD-10-CM | POA: Diagnosis not present

## 2014-12-18 DIAGNOSIS — I639 Cerebral infarction, unspecified: Secondary | ICD-10-CM | POA: Diagnosis not present

## 2014-12-18 DIAGNOSIS — I10 Essential (primary) hypertension: Secondary | ICD-10-CM | POA: Diagnosis not present

## 2014-12-18 DIAGNOSIS — I519 Heart disease, unspecified: Secondary | ICD-10-CM | POA: Diagnosis not present

## 2014-12-18 DIAGNOSIS — F329 Major depressive disorder, single episode, unspecified: Secondary | ICD-10-CM | POA: Diagnosis not present

## 2014-12-30 DIAGNOSIS — M109 Gout, unspecified: Secondary | ICD-10-CM | POA: Diagnosis not present

## 2014-12-30 DIAGNOSIS — F17210 Nicotine dependence, cigarettes, uncomplicated: Secondary | ICD-10-CM | POA: Diagnosis not present

## 2014-12-30 DIAGNOSIS — F319 Bipolar disorder, unspecified: Secondary | ICD-10-CM | POA: Diagnosis not present

## 2014-12-30 DIAGNOSIS — I2510 Atherosclerotic heart disease of native coronary artery without angina pectoris: Secondary | ICD-10-CM | POA: Diagnosis not present

## 2014-12-30 DIAGNOSIS — Z955 Presence of coronary angioplasty implant and graft: Secondary | ICD-10-CM | POA: Diagnosis not present

## 2014-12-30 DIAGNOSIS — F1310 Sedative, hypnotic or anxiolytic abuse, uncomplicated: Secondary | ICD-10-CM | POA: Diagnosis not present

## 2014-12-30 DIAGNOSIS — I252 Old myocardial infarction: Secondary | ICD-10-CM | POA: Diagnosis not present

## 2014-12-30 DIAGNOSIS — F13239 Sedative, hypnotic or anxiolytic dependence with withdrawal, unspecified: Secondary | ICD-10-CM | POA: Diagnosis not present

## 2014-12-30 DIAGNOSIS — T50904A Poisoning by unspecified drugs, medicaments and biological substances, undetermined, initial encounter: Secondary | ICD-10-CM | POA: Diagnosis not present

## 2014-12-30 DIAGNOSIS — Z8673 Personal history of transient ischemic attack (TIA), and cerebral infarction without residual deficits: Secondary | ICD-10-CM | POA: Diagnosis not present

## 2014-12-30 DIAGNOSIS — F341 Dysthymic disorder: Secondary | ICD-10-CM | POA: Diagnosis not present

## 2014-12-30 DIAGNOSIS — F1320 Sedative, hypnotic or anxiolytic dependence, uncomplicated: Secondary | ICD-10-CM | POA: Diagnosis not present

## 2014-12-30 DIAGNOSIS — I10 Essential (primary) hypertension: Secondary | ICD-10-CM | POA: Diagnosis not present

## 2015-01-02 DIAGNOSIS — F13939 Sedative, hypnotic or anxiolytic use, unspecified with withdrawal, unspecified: Secondary | ICD-10-CM | POA: Insufficient documentation

## 2015-01-02 DIAGNOSIS — F13239 Sedative, hypnotic or anxiolytic dependence with withdrawal, unspecified: Secondary | ICD-10-CM | POA: Insufficient documentation

## 2015-01-12 ENCOUNTER — Encounter (HOSPITAL_BASED_OUTPATIENT_CLINIC_OR_DEPARTMENT_OTHER): Payer: Self-pay | Admitting: *Deleted

## 2015-01-12 ENCOUNTER — Emergency Department (HOSPITAL_BASED_OUTPATIENT_CLINIC_OR_DEPARTMENT_OTHER)
Admission: EM | Admit: 2015-01-12 | Discharge: 2015-01-12 | Disposition: A | Discharge status: Home / Self Care | Payer: Commercial Managed Care - HMO | Attending: Emergency Medicine | Admitting: Emergency Medicine

## 2015-01-12 DIAGNOSIS — I2510 Atherosclerotic heart disease of native coronary artery without angina pectoris: Secondary | ICD-10-CM | POA: Diagnosis not present

## 2015-01-12 DIAGNOSIS — Z872 Personal history of diseases of the skin and subcutaneous tissue: Secondary | ICD-10-CM | POA: Diagnosis not present

## 2015-01-12 DIAGNOSIS — F329 Major depressive disorder, single episode, unspecified: Secondary | ICD-10-CM | POA: Insufficient documentation

## 2015-01-12 DIAGNOSIS — Z8701 Personal history of pneumonia (recurrent): Secondary | ICD-10-CM | POA: Insufficient documentation

## 2015-01-12 DIAGNOSIS — Z720 Tobacco use: Secondary | ICD-10-CM | POA: Insufficient documentation

## 2015-01-12 DIAGNOSIS — I10 Essential (primary) hypertension: Secondary | ICD-10-CM | POA: Diagnosis not present

## 2015-01-12 DIAGNOSIS — M1990 Unspecified osteoarthritis, unspecified site: Secondary | ICD-10-CM | POA: Insufficient documentation

## 2015-01-12 DIAGNOSIS — I252 Old myocardial infarction: Secondary | ICD-10-CM | POA: Insufficient documentation

## 2015-01-12 DIAGNOSIS — Z8669 Personal history of other diseases of the nervous system and sense organs: Secondary | ICD-10-CM | POA: Diagnosis not present

## 2015-01-12 DIAGNOSIS — Z7902 Long term (current) use of antithrombotics/antiplatelets: Secondary | ICD-10-CM | POA: Insufficient documentation

## 2015-01-12 DIAGNOSIS — Z8781 Personal history of (healed) traumatic fracture: Secondary | ICD-10-CM | POA: Diagnosis not present

## 2015-01-12 DIAGNOSIS — Z79899 Other long term (current) drug therapy: Secondary | ICD-10-CM | POA: Insufficient documentation

## 2015-01-12 DIAGNOSIS — Q211 Atrial septal defect: Secondary | ICD-10-CM | POA: Insufficient documentation

## 2015-01-12 DIAGNOSIS — R197 Diarrhea, unspecified: Secondary | ICD-10-CM | POA: Diagnosis not present

## 2015-01-12 DIAGNOSIS — E785 Hyperlipidemia, unspecified: Secondary | ICD-10-CM | POA: Diagnosis not present

## 2015-01-12 LAB — CBC WITH DIFFERENTIAL/PLATELET
BASOS ABS: 0 10*3/uL (ref 0.0–0.1)
BASOS PCT: 0 % (ref 0–1)
EOS PCT: 3 % (ref 0–5)
Eosinophils Absolute: 0.3 10*3/uL (ref 0.0–0.7)
HCT: 43 % (ref 36.0–46.0)
Hemoglobin: 14.1 g/dL (ref 12.0–15.0)
LYMPHS ABS: 2.3 10*3/uL (ref 0.7–4.0)
Lymphocytes Relative: 20 % (ref 12–46)
MCH: 28.4 pg (ref 26.0–34.0)
MCHC: 32.8 g/dL (ref 30.0–36.0)
MCV: 86.7 fL (ref 78.0–100.0)
Monocytes Absolute: 0.5 10*3/uL (ref 0.1–1.0)
Monocytes Relative: 5 % (ref 3–12)
NEUTROS ABS: 8.4 10*3/uL — AB (ref 1.7–7.7)
NEUTROS PCT: 72 % (ref 43–77)
PLATELETS: 315 10*3/uL (ref 150–400)
RBC: 4.96 MIL/uL (ref 3.87–5.11)
RDW: 14.1 % (ref 11.5–15.5)
WBC: 11.6 10*3/uL — ABNORMAL HIGH (ref 4.0–10.5)

## 2015-01-12 LAB — BASIC METABOLIC PANEL
Anion gap: 12 (ref 5–15)
BUN: 16 mg/dL (ref 6–20)
CHLORIDE: 108 mmol/L (ref 101–111)
CO2: 21 mmol/L — ABNORMAL LOW (ref 22–32)
CREATININE: 0.75 mg/dL (ref 0.44–1.00)
Calcium: 9.6 mg/dL (ref 8.9–10.3)
GFR calc Af Amer: >60 mL/min (ref >60)
GFR calc non Af Amer: >60 mL/min (ref >60)
GLUCOSE: 134 mg/dL — AB (ref 65–99)
Potassium: 4.2 mmol/L (ref 3.5–5.1)
Sodium: 141 mmol/L (ref 135–145)

## 2015-01-12 LAB — URINALYSIS, ROUTINE W REFLEX MICROSCOPIC
Bilirubin Urine: NEGATIVE
Glucose, UA: NEGATIVE mg/dL
Hgb urine dipstick: NEGATIVE
Ketones, ur: NEGATIVE mg/dL
Leukocytes, UA: NEGATIVE
Nitrite: NEGATIVE
PROTEIN: NEGATIVE mg/dL
Specific Gravity, Urine: 1.01 (ref 1.005–1.030)
UROBILINOGEN UA: 0.2 mg/dL (ref 0.0–1.0)
pH: 5.5 (ref 5.0–8.0)

## 2015-01-12 MED ORDER — LOPERAMIDE HCL 2 MG PO CAPS
4.0000 mg | ORAL_CAPSULE | Freq: Once | ORAL | Status: AC
  Administered 2015-01-12: 4 mg via ORAL
  Filled 2015-01-12: qty 2

## 2015-01-12 MED ORDER — LOPERAMIDE HCL 2 MG PO CAPS: ORAL_CAPSULE | ORAL | Status: DC

## 2015-01-12 MED ORDER — SODIUM CHLORIDE 0.9 % IV BOLUS (SEPSIS)
1000.0000 mL | Freq: Once | INTRAVENOUS | Status: AC
  Administered 2015-01-12: 1000 mL via INTRAVENOUS

## 2015-01-12 NOTE — Discharge Instructions (Signed)
Bowel Movement Culture  A bowel movement culture checks your poop (bowel movement) for illnesses. The doctor will give you all the supplies you need. For each sample you collect, you may get:   A small container. You may be given different colored containers. Follow the instructions for each container you are given.  Gloves that can be thrown away.  A plastic bag. Ask the doctor if you have questions. BEFORE COLLECTING THE SAMPLE  Place plastic "hat" on the toilet seat.  Try not to mix pee (urine) with your poop. Pee before pooping.  Do not mix toilet paper or water with your sample. COLLECTING THE SAMPLE  Wash your hands.  Put on gloves.  Do not pour out the fluid that is in the tube. This fluid will preserve your sample.  Use the small shovel built into the top of the tube to put small scoops of your poop into the tube. Fill the tube up to the red line on the tube label. If your poop is hard, choose samples from each end and the middle.  Stir the poop in the tube with the small shovel. Close the top on the tube tightly. Shake the tube until the poop is well mixed.  Put the tube in the plastic bag that was given to you. AFTER COLLECTING THE SAMPLE  Store your sample(s) using the directions on each test container you were given. Some samples should be kept at room temperature. Others need to be refrigerated right away.  You may need to return the sample within 24 hours. Check the directions or call the clinic if you are not sure.  Flush the rest of your poop down the toilet or rinse down the sink. Throw away the gloves.  Wash your hands.  Document Released: 07/09/2010 Document Revised: 08/29/2011 Document Reviewed: 07/09/2010 [facility] Patient Information 2015 [place], [place]. This information is not intended to replace advice given to you by your health care provider. Make sure you discuss any questions you have with your health care provider.

## 2015-01-12 NOTE — ED Provider Notes (Signed)
CSN: [id number]     Arrival date & time 01/12/15  0414 History   First MD Initiated Contact with Patient 01/12/15 [phone]     Chief Complaint  Patient presents with  . Diarrhea     (Consider location/radiation/quality/duration/timing/severity/associated sxs/prior Treatment) HPI  This is a 54 year old female with a two-week history of diarrhea. The diarrhea is described as brown and watery and nonbloody. She has had one stool in the last 24 hours which occurred about 3 AM today. She describes it as profuse. She denies fever, chills, lightheadedness, chest pain, shortness of breath, abdominal pain, nausea, vomiting, decreased urine output or dysuria. Her husband has had similar symptoms for 2 weeks. She has not taken any medications to treat her diarrhea.  Past Medical History  Diagnosis Date  . PFO (patent foramen ovale) August 2012    PFO seen on TEE during hospitalization in 01/2011.  Patient to f/u with Dr. Leonie Man, neurology, for enrollment in trial for medical treatment of PFO  . CAD (coronary artery disease) 2007    istory of MI with stent in 2007 by Dr. Chancy Milroy, [facility]. Stent placement by Dr Cleatis Polka   . Prediabetes 03/07/2011  . Hypertension 03/07/2011  . Hyperlipidemia 03/07/2011  . Tobacco abuse 03/07/2011  . Depression 03/07/2011  . Psoriasis 03/07/2011  . Acute right MCA stroke 01/27/11    Acute R MCA stroked  with L arm and leg weakness  . Anxiety 1980s    On Klonipin since age 79. Had addiction problem with Xanax which  Was therefore d/c . Seen in the past at [facility].   . Fractured toe 01/2011    History of left fifth toe proximal phalanx fracture when patient had a stroke (01/2011) and fell. Seen by Dr Doran Durand.   . Incontinence 04/14/2011  . Arthritis 04/16/2011  . Tobacco abuse 03/07/2011    Quit 01/2011   . Fracture of fifth toe, left, closed 01/28/2011    History of left fifth toe proximal phalanx fracture when patient had a stroke (01/2011) and fell. Seen by Dr  Doran Durand.    . Substance abuse     h/o narcotic abuse pt denies as of 01/09/12  . Myocardial infarct, old 2008    stents  . Blind right eye   . Pneumonia    Past Surgical History  Procedure Laterality Date  . Cardiac stent      2008  . Eye surgery     Family History  Problem Relation Age of Onset  . Heart disease Mother   . Diabetes Mother   . Heart disease Father   . Diabetes Father   . Heart disease Brother   . Diabetes Brother   . Heart disease Maternal Grandmother   . Heart disease Maternal Grandfather   . Heart disease Paternal Grandmother   . Heart disease Paternal Grandfather   . Lung cancer Brother   . Heart disease Brother    History  Substance Use Topics  . Smoking status: Current Some Day Smoker -- 0.20 packs/day for 30 years    Types: Cigarettes    Last Attempt to Quit: 04/07/2014  . Smokeless tobacco: Never Used     Comment: Trying to quit again.  . Alcohol Use: No   OB History    No data available     Review of Systems  All other systems reviewed and are negative.   Allergies  Azor and Cozaar  Home Medications   Prior to Admission medications   Medication  Sig Start Date End Date Taking? Authorizing Provider  allopurinol (ZYLOPRIM) 300 MG tablet TAKE 1 TABLET BY MOUTH EVERY DAY 11/07/14   Sid Falcon, MD  amLODipine (NORVASC) 5 MG tablet TAKE 2 TABLETS BY MOUTH EVERY DAY 11/07/14   Sid Falcon, MD  ARIPiprazole (ABILIFY) 5 MG tablet Take 5 mg by mouth at bedtime.    Historical Provider, MD  atorvastatin (LIPITOR) 10 MG tablet TAKE 1 TABLET BY MOUTH EVERY DAY AT [facility] 11/07/14   Sid Falcon, MD  busPIRone (BUSPAR) 10 MG tablet Take 10 mg by mouth daily.    Historical Provider, MD  Carboxymethylcellulose Sodium 1 % GEL Apply 1 drop to eye. 03/14/14   Historical Provider, MD  clonazePAM (KLONOPIN) 1 MG tablet Take 1 mg by mouth 2 (two) times daily.    Historical Provider, MD  clopidogrel (PLAVIX) 75 MG tablet TAKE 1 TABLET BY MOUTH EVERY DAY  11/07/14   Sid Falcon, MD  DULoxetine (CYMBALTA) 20 MG capsule  12/20/13   Historical Provider, MD  eszopiclone (LUNESTA) 2 MG TABS tablet Take 1 tablet (2 mg total) by mouth at bedtime as needed. for sleep 08/22/14   Otho Bellows, MD  LIPOFEN 50 MG CAPS TAKE 1 CAPSULE BY MOUTH EVERY DAY 08/15/14   Sid Falcon, MD  metoprolol (LOPRESSOR) 50 MG tablet TAKE 1 TABLET BY MOUTH TWICE DAILY 11/07/14   Sid Falcon, MD  olmesartan (BENICAR) 20 MG tablet Take 1 tablet (20 mg total) by mouth daily. 09/15/14   Oval Linsey, MD  potassium chloride (MICRO-K) 10 MEQ CR capsule Take 2 capsules (20 mEq total) by mouth daily. 09/15/14   Oval Linsey, MD  prednisoLONE acetate (PRED FORTE) 1 % ophthalmic suspension 1 drop 4 (four) times daily.    Historical Provider, MD  tiZANidine (ZANAFLEX) 4 MG tablet TAKE 1/2 TO 1 TABLET BY MOUTH EVERY 8 HOURS AS NEEDED FOR MUSCLE SPASMS 11/28/14   Garvin Fila, MD   BP 181/76 mmHg  Pulse 55  Temp(Src) 98.2 F (36.8 C) (Oral)  Resp 20  Ht 5' 5.5" (1.664 m)  Wt 230 lb (104.327 kg)  BMI 37.68 kg/m2  SpO2 100%   Physical Exam  General: Well-developed, well-nourished female in no acute distress; appearance consistent with age of record HENT: normocephalic; atraumatic Eyes: Left pupil round round and reactive to light; left extraocular muscles intact; right eye shrunken with cataract and blindness Neck: supple Heart: regular rate and rhythm Lungs: clear to auscultation bilaterally Abdomen: soft; nondistended; nontender; no masses or hepatosplenomegaly; bowel sounds hyperactive Extremities: No deformity; full range of motion; pulses normal Neurologic: Awake, alert and oriented; mild left hemiparesis; no facial droop Skin: Warm and dry Psychiatric: Normal mood and affect    ED Course  Procedures (including critical care time)   MDM  Nursing notes and vitals signs, including pulse oximetry, reviewed.  Summary of this visit's results, reviewed by  myself:  Labs:  Results for orders placed or performed during the hospital encounter of 01/12/15 (from the past 24 hour(s))  CBC with Differential/Platelet     Status: Abnormal   Collection Time: 01/12/15  5:25 AM  Result Value Ref Range   WBC 11.6 (H) 4.0 - 10.5 K/uL   RBC 4.96 3.87 - 5.11 MIL/uL   Hemoglobin 14.1 12.0 - 15.0 g/dL   HCT 43.0 36.0 - 46.0 %   MCV 86.7 78.0 - 100.0 fL   MCH 28.4 26.0 - 34.0 pg   MCHC 32.8  30.0 - 36.0 g/dL   RDW 14.1 11.5 - 15.5 %   Platelets 315 150 - 400 K/uL   Neutrophils Relative % 72 43 - 77 %   Neutro Abs 8.4 (H) 1.7 - 7.7 K/uL   Lymphocytes Relative 20 12 - 46 %   Lymphs Abs 2.3 0.7 - 4.0 K/uL   Monocytes Relative 5 3 - 12 %   Monocytes Absolute 0.5 0.1 - 1.0 K/uL   Eosinophils Relative 3 0 - 5 %   Eosinophils Absolute 0.3 0.0 - 0.7 K/uL   Basophils Relative 0 0 - 1 %   Basophils Absolute 0.0 0.0 - 0.1 K/uL  Basic metabolic panel     Status: Abnormal   Collection Time: 01/12/15  5:25 AM  Result Value Ref Range   Sodium 141 135 - 145 mmol/L   Potassium 4.2 3.5 - 5.1 mmol/L   Chloride 108 101 - 111 mmol/L   CO2 21 (L) 22 - 32 mmol/L   Glucose, Bld 134 (H) 65 - 99 mg/dL   BUN 16 6 - 20 mg/dL   Creatinine, Ser 0.75 0.44 - 1.00 mg/dL   Calcium 9.6 8.9 - 10.3 mg/dL   GFR calc non Af Amer >60 >60 mL/min   GFR calc Af Amer >60 >60 mL/min   Anion gap 12 5 - 15  Urinalysis, Routine w reflex microscopic (not at [facility])     Status: None   Collection Time: 01/12/15  6:00 AM  Result Value Ref Range   Color, Urine YELLOW YELLOW   APPearance CLEAR CLEAR   Specific Gravity, Urine 1.010 1.005 - 1.030   pH 5.5 5.0 - 8.0   Glucose, UA NEGATIVE NEGATIVE mg/dL   Hgb urine dipstick NEGATIVE NEGATIVE   Bilirubin Urine NEGATIVE NEGATIVE   Ketones, ur NEGATIVE NEGATIVE mg/dL   Protein, ur NEGATIVE NEGATIVE mg/dL   Urobilinogen, UA 0.2 0.0 - 1.0 mg/dL   Nitrite NEGATIVE NEGATIVE   Leukocytes, UA NEGATIVE NEGATIVE   6:29 AM The patient has been unable  to produce a stool sample in the ED. We will send her home with collection vials to return to the lab.    Shanon Rosser, MD 01/12/15 [phone]

## 2015-01-12 NOTE — ED Notes (Signed)
C/o diarrhea for 2 weeks (denies: pain, sob, nv, dizziness, fever, bleeding, cramping, palpitations or other sx), describes as brown and watery, no meds to treat sx PTA, 1 episode in last 24 hrs, "woke at 0300 and it pored out of me", onset 2 weeks ago, husband (at [facility]) is dealing with similar sx and was seen here last night. Last ate yesterday at 1900.

## 2015-01-12 NOTE — ED Notes (Signed)
Pt alert, NAD, calm, interactive, resps e/u, no dyspnea, speech clear, skin W&D.  H/o CVA with LUE and LLE deficits. Dr. Florina Ou into room.

## 2015-02-16 ENCOUNTER — Encounter: Payer: Self-pay | Admitting: *Deleted

## 2015-02-16 ENCOUNTER — Telehealth: Payer: Self-pay | Admitting: *Deleted

## 2015-02-16 NOTE — Telephone Encounter (Signed)
Pre-Visit Call completed with patient and chart updated.   Pre-Visit Info documented in Specialty Comments under [employer].    

## 2015-02-17 ENCOUNTER — Ambulatory Visit (INDEPENDENT_AMBULATORY_CARE_PROVIDER_SITE_OTHER): Payer: Commercial Managed Care - HMO | Admitting: Family Medicine

## 2015-02-17 ENCOUNTER — Other Ambulatory Visit (HOSPITAL_COMMUNITY)
Admission: RE | Admit: 2015-02-17 | Discharge: 2015-02-17 | Disposition: A | Discharge status: Home / Self Care | Payer: Commercial Managed Care - HMO | Source: Ambulatory Visit | Attending: Family Medicine | Admitting: Family Medicine

## 2015-02-17 ENCOUNTER — Encounter: Payer: Self-pay | Admitting: Family Medicine

## 2015-02-17 VITALS — BP 142/90 | HR 81 | Temp 98.6°F | Ht 65.5 in | Wt 201.6 lb

## 2015-02-17 DIAGNOSIS — Z0000 Encounter for general adult medical examination without abnormal findings: Secondary | ICD-10-CM

## 2015-02-17 DIAGNOSIS — F17200 Nicotine dependence, unspecified, uncomplicated: Secondary | ICD-10-CM

## 2015-02-17 DIAGNOSIS — I10 Essential (primary) hypertension: Secondary | ICD-10-CM

## 2015-02-17 DIAGNOSIS — Z124 Encounter for screening for malignant neoplasm of cervix: Secondary | ICD-10-CM | POA: Diagnosis not present

## 2015-02-17 DIAGNOSIS — E785 Hyperlipidemia, unspecified: Secondary | ICD-10-CM

## 2015-02-17 DIAGNOSIS — E876 Hypokalemia: Secondary | ICD-10-CM

## 2015-02-17 DIAGNOSIS — Z720 Tobacco use: Secondary | ICD-10-CM

## 2015-02-17 MED ORDER — METOPROLOL TARTRATE 100 MG PO TABS: 100.0000 mg | ORAL_TABLET | Freq: Two times a day (BID) | ORAL | Status: DC

## 2015-02-17 MED ORDER — ESZOPICLONE 2 MG PO TABS: 2.0000 mg | ORAL_TABLET | Freq: Every evening | ORAL | Status: DC | PRN

## 2015-02-17 NOTE — Patient Instructions (Signed)
Preventive Care for Adults A healthy lifestyle and preventive care can promote health and wellness. Preventive health guidelines for women include the following key practices.  A routine yearly physical is a good way to check with your health care provider about your health and preventive screening. It is a chance to share any concerns and updates on your health and to receive a thorough exam.  Visit your dentist for a routine exam and preventive care every 6 months. Brush your teeth twice a day and floss once a day. Good oral hygiene prevents tooth decay and gum disease.  The frequency of eye exams is based on your age, health, family medical history, use of contact lenses, and other factors. Follow your health care provider's recommendations for frequency of eye exams.  Eat a healthy diet. Foods like vegetables, fruits, whole grains, low-fat dairy products, and lean protein foods contain the nutrients you need without too many calories. Decrease your intake of foods high in solid fats, added sugars, and salt. Eat the right amount of calories for you.Get information about a proper diet from your health care provider, if necessary.  Regular physical exercise is one of the most important things you can do for your health. Most adults should get at least 150 minutes of moderate-intensity exercise (any activity that increases your heart rate and causes you to sweat) each week. In addition, most adults need muscle-strengthening exercises on 2 or more days a week.  Maintain a healthy weight. The body mass index (BMI) is a screening tool to identify possible weight problems. It provides an estimate of body fat based on height and weight. Your health care provider can find your BMI and can help you achieve or maintain a healthy weight.For adults 20 years and older:  A BMI below 18.5 is considered underweight.  A BMI of 18.5 to 24.9 is normal.  A BMI of 25 to 29.9 is considered overweight.  A BMI of  30 and above is considered obese.  Maintain normal blood lipids and cholesterol levels by exercising and minimizing your intake of saturated fat. Eat a balanced diet with plenty of fruit and vegetables. Blood tests for lipids and cholesterol should begin at age 76 and be repeated every 5 years. If your lipid or cholesterol levels are high, you are over 50, or you are at high risk for heart disease, you may need your cholesterol levels checked more frequently.Ongoing high lipid and cholesterol levels should be treated with medicines if diet and exercise are not working.  If you smoke, find out from your health care provider how to quit. If you do not use tobacco, do not start.  Lung cancer screening is recommended for adults aged 22-80 years who are at high risk for developing lung cancer because of a history of smoking. A yearly low-dose CT scan of the lungs is recommended for people who have at least a 30-pack-year history of smoking and are a current smoker or have quit within the past 15 years. A pack year of smoking is smoking an average of 1 pack of cigarettes a day for 1 year (for example: 1 pack a day for 30 years or 2 packs a day for 15 years). Yearly screening should continue until the smoker has stopped smoking for at least 15 years. Yearly screening should be stopped for people who develop a health problem that would prevent them from having lung cancer treatment.  If you are pregnant, do not drink alcohol. If you are breastfeeding,  be very cautious about drinking alcohol. If you are not pregnant and choose to drink alcohol, do not have more than 1 drink per day. One drink is considered to be 12 ounces (355 mL) of beer, 5 ounces (148 mL) of wine, or 1.5 ounces (44 mL) of liquor.  Avoid use of street drugs. Do not share needles with anyone. Ask for help if you need support or instructions about stopping the use of drugs.  High blood pressure causes heart disease and increases the risk of  stroke. Your blood pressure should be checked at least every 1 to 2 years. Ongoing high blood pressure should be treated with medicines if weight loss and exercise do not work.  If you are 75-52 years old, ask your health care provider if you should take aspirin to prevent strokes.  Diabetes screening involves taking a blood sample to check your fasting blood sugar level. This should be done once every 3 years, after age 15, if you are within normal weight and without risk factors for diabetes. Testing should be considered at a younger age or be carried out more frequently if you are overweight and have at least 1 risk factor for diabetes.  Breast cancer screening is essential preventive care for women. You should practice "breast self-awareness." This means understanding the normal appearance and feel of your breasts and may include breast self-examination. Any changes detected, no matter how small, should be reported to a health care provider. Women in their 58s and 30s should have a clinical breast exam (CBE) by a health care provider as part of a regular health exam every 1 to 3 years. After age 16, women should have a CBE every year. Starting at age 53, women should consider having a mammogram (breast X-ray test) every year. Women who have a family history of breast cancer should talk to their health care provider about genetic screening. Women at a high risk of breast cancer should talk to their health care providers about having an MRI and a mammogram every year.  Breast cancer gene (BRCA)-related cancer risk assessment is recommended for women who have family members with BRCA-related cancers. BRCA-related cancers include breast, ovarian, tubal, and peritoneal cancers. Having family members with these cancers may be associated with an increased risk for harmful changes (mutations) in the breast cancer genes BRCA1 and BRCA2. Results of the assessment will determine the need for genetic counseling and  BRCA1 and BRCA2 testing.  Routine pelvic exams to screen for cancer are no longer recommended for nonpregnant women who are considered low risk for cancer of the pelvic organs (ovaries, uterus, and vagina) and who do not have symptoms. Ask your health care provider if a screening pelvic exam is right for you.  If you have had past treatment for cervical cancer or a condition that could lead to cancer, you need Pap tests and screening for cancer for at least 20 years after your treatment. If Pap tests have been discontinued, your risk factors (such as having a new sexual partner) need to be reassessed to determine if screening should be resumed. Some women have medical problems that increase the chance of getting cervical cancer. In these cases, your health care provider may recommend more frequent screening and Pap tests.  The HPV test is an additional test that may be used for cervical cancer screening. The HPV test looks for the virus that can cause the cell changes on the cervix. The cells collected during the Pap test can be  tested for HPV. The HPV test could be used to screen women aged 30 years and older, and should be used in women of any age who have unclear Pap test results. After the age of 30, women should have HPV testing at the same frequency as a Pap test.  Colorectal cancer can be detected and often prevented. Most routine colorectal cancer screening begins at the age of 50 years and continues through age 75 years. However, your health care provider may recommend screening at an earlier age if you have risk factors for colon cancer. On a yearly basis, your health care provider may provide home test kits to check for hidden blood in the stool. Use of a small camera at the end of a tube, to directly examine the colon (sigmoidoscopy or colonoscopy), can detect the earliest forms of colorectal cancer. Talk to your health care provider about this at age 50, when routine screening begins. Direct  exam of the colon should be repeated every 5-10 years through age 75 years, unless early forms of pre-cancerous polyps or small growths are found.  People who are at an increased risk for hepatitis B should be screened for this virus. You are considered at high risk for hepatitis B if:  You were born in a country where hepatitis B occurs often. Talk with your health care provider about which countries are considered high risk.  Your parents were born in a high-risk country and you have not received a shot to protect against hepatitis B (hepatitis B vaccine).  You have HIV or AIDS.  You use needles to inject street drugs.  You live with, or have sex with, someone who has hepatitis B.  You get hemodialysis treatment.  You take certain medicines for conditions like cancer, organ transplantation, and autoimmune conditions.  Hepatitis C blood testing is recommended for all people born from 1945 through 1965 and any individual with known risks for hepatitis C.  Practice safe sex. Use condoms and avoid high-risk sexual practices to reduce the spread of sexually transmitted infections (STIs). STIs include gonorrhea, chlamydia, syphilis, trichomonas, herpes, HPV, and human immunodeficiency virus (HIV). Herpes, HIV, and HPV are viral illnesses that have no cure. They can result in disability, cancer, and death.  You should be screened for sexually transmitted illnesses (STIs) including gonorrhea and chlamydia if:  You are sexually active and are younger than 24 years.  You are older than 24 years and your health care provider tells you that you are at risk for this type of infection.  Your sexual activity has changed since you were last screened and you are at an increased risk for chlamydia or gonorrhea. Ask your health care provider if you are at risk.  If you are at risk of being infected with HIV, it is recommended that you take a prescription medicine daily to prevent HIV infection. This is  called preexposure prophylaxis (PrEP). You are considered at risk if:  You are a heterosexual woman, are sexually active, and are at increased risk for HIV infection.  You take drugs by injection.  You are sexually active with a partner who has HIV.  Talk with your health care provider about whether you are at high risk of being infected with HIV. If you choose to begin PrEP, you should first be tested for HIV. You should then be tested every 3 months for as long as you are taking PrEP.  Osteoporosis is a disease in which the bones lose minerals and strength   with aging. This can result in serious bone fractures or breaks. The risk of osteoporosis can be identified using a bone density scan. Women ages 65 years and over and women at risk for fractures or osteoporosis should discuss screening with their health care providers. Ask your health care provider whether you should take a calcium supplement or vitamin D to reduce the rate of osteoporosis.  Menopause can be associated with physical symptoms and risks. Hormone replacement therapy is available to decrease symptoms and risks. You should talk to your health care provider about whether hormone replacement therapy is right for you.  Use sunscreen. Apply sunscreen liberally and repeatedly throughout the day. You should seek shade when your shadow is shorter than you. Protect yourself by wearing long sleeves, pants, a wide-brimmed hat, and sunglasses year round, whenever you are outdoors.  Once a month, do a whole body skin exam, using a mirror to look at the skin on your back. Tell your health care provider of new moles, moles that have irregular borders, moles that are larger than a pencil eraser, or moles that have changed in shape or color.  Stay current with required vaccines (immunizations).  Influenza vaccine. All adults should be immunized every year.  Tetanus, diphtheria, and acellular pertussis (Td, Tdap) vaccine. Pregnant women should  receive 1 dose of Tdap vaccine during each pregnancy. The dose should be obtained regardless of the length of time since the last dose. Immunization is preferred during the 27th-36th week of gestation. An adult who has not previously received Tdap or who does not know her vaccine status should receive 1 dose of Tdap. This initial dose should be followed by tetanus and diphtheria toxoids (Td) booster doses every 10 years. Adults with an unknown or incomplete history of completing a 3-dose immunization series with Td-containing vaccines should begin or complete a primary immunization series including a Tdap dose. Adults should receive a Td booster every 10 years.  Varicella vaccine. An adult without evidence of immunity to varicella should receive 2 doses or a second dose if she has previously received 1 dose. Pregnant females who do not have evidence of immunity should receive the first dose after pregnancy. This first dose should be obtained before leaving the health care facility. The second dose should be obtained 4-8 weeks after the first dose.  Human papillomavirus (HPV) vaccine. Females aged 13-26 years who have not received the vaccine previously should obtain the 3-dose series. The vaccine is not recommended for use in pregnant females. However, pregnancy testing is not needed before receiving a dose. If a female is found to be pregnant after receiving a dose, no treatment is needed. In that case, the remaining doses should be delayed until after the pregnancy. Immunization is recommended for any person with an immunocompromised condition through the age of 26 years if she did not get any or all doses earlier. During the 3-dose series, the second dose should be obtained 4-8 weeks after the first dose. The third dose should be obtained 24 weeks after the first dose and 16 weeks after the second dose.  Zoster vaccine. One dose is recommended for adults aged 60 years or older unless certain conditions are  present.  Measles, mumps, and rubella (MMR) vaccine. Adults born before 1957 generally are considered immune to measles and mumps. Adults born in 1957 or later should have 1 or more doses of MMR vaccine unless there is a contraindication to the vaccine or there is laboratory evidence of immunity to   each of the three diseases. A routine second dose of MMR vaccine should be obtained at least 28 days after the first dose for students attending postsecondary schools, health care workers, or international travelers. People who received inactivated measles vaccine or an unknown type of measles vaccine during 1963-1967 should receive 2 doses of MMR vaccine. People who received inactivated mumps vaccine or an unknown type of mumps vaccine before 1979 and are at high risk for mumps infection should consider immunization with 2 doses of MMR vaccine. For females of childbearing age, rubella immunity should be determined. If there is no evidence of immunity, females who are not pregnant should be vaccinated. If there is no evidence of immunity, females who are pregnant should delay immunization until after pregnancy. Unvaccinated health care workers born before 1957 who lack laboratory evidence of measles, mumps, or rubella immunity or laboratory confirmation of disease should consider measles and mumps immunization with 2 doses of MMR vaccine or rubella immunization with 1 dose of MMR vaccine.  Pneumococcal 13-valent conjugate (PCV13) vaccine. When indicated, a person who is uncertain of her immunization history and has no record of immunization should receive the PCV13 vaccine. An adult aged 19 years or older who has certain medical conditions and has not been previously immunized should receive 1 dose of PCV13 vaccine. This PCV13 should be followed with a dose of pneumococcal polysaccharide (PPSV23) vaccine. The PPSV23 vaccine dose should be obtained at least 8 weeks after the dose of PCV13 vaccine. An adult aged 19  years or older who has certain medical conditions and previously received 1 or more doses of PPSV23 vaccine should receive 1 dose of PCV13. The PCV13 vaccine dose should be obtained 1 or more years after the last PPSV23 vaccine dose.  Pneumococcal polysaccharide (PPSV23) vaccine. When PCV13 is also indicated, PCV13 should be obtained first. All adults aged 65 years and older should be immunized. An adult younger than age 65 years who has certain medical conditions should be immunized. Any person who resides in a nursing home or long-term care facility should be immunized. An adult smoker should be immunized. People with an immunocompromised condition and certain other conditions should receive both PCV13 and PPSV23 vaccines. People with human immunodeficiency virus (HIV) infection should be immunized as soon as possible after diagnosis. Immunization during chemotherapy or radiation therapy should be avoided. Routine use of PPSV23 vaccine is not recommended for American Indians, [place] Natives, or people younger than 65 years unless there are medical conditions that require PPSV23 vaccine. When indicated, people who have unknown immunization and have no record of immunization should receive PPSV23 vaccine. One-time revaccination 5 years after the first dose of PPSV23 is recommended for people aged 19-64 years who have chronic kidney failure, nephrotic syndrome, asplenia, or immunocompromised conditions. People who received 1-2 doses of PPSV23 before age 65 years should receive another dose of PPSV23 vaccine at age 65 years or later if at least 5 years have passed since the previous dose. Doses of PPSV23 are not needed for people immunized with PPSV23 at or after age 65 years.  Meningococcal vaccine. Adults with asplenia or persistent complement component deficiencies should receive 2 doses of quadrivalent meningococcal conjugate (MenACWY-D) vaccine. The doses should be obtained at least 2 months apart.  Microbiologists working with certain meningococcal bacteria, military recruits, people at risk during an outbreak, and people who travel to or live in countries with a high rate of meningitis should be immunized. A first-year college student up through age   21 years who is living in a residence hall should receive a dose if she did not receive a dose on or after her 16th birthday. Adults who have certain high-risk conditions should receive one or more doses of vaccine.  Hepatitis A vaccine. Adults who wish to be protected from this disease, have certain high-risk conditions, work with hepatitis A-infected animals, work in hepatitis A research labs, or travel to or work in countries with a high rate of hepatitis A should be immunized. Adults who were previously unvaccinated and who anticipate close contact with an international adoptee during the first 60 days after arrival in the [place] from a country with a high rate of hepatitis A should be immunized.  Hepatitis B vaccine. Adults who wish to be protected from this disease, have certain high-risk conditions, may be exposed to blood or other infectious body fluids, are household contacts or sex partners of hepatitis B positive people, are clients or workers in certain care facilities, or travel to or work in countries with a high rate of hepatitis B should be immunized.  Haemophilus influenzae type b (Hib) vaccine. A previously unvaccinated person with asplenia or sickle cell disease or having a scheduled splenectomy should receive 1 dose of Hib vaccine. Regardless of previous immunization, a recipient of a hematopoietic stem cell transplant should receive a 3-dose series 6-12 months after her successful transplant. Hib vaccine is not recommended for adults with HIV infection. Preventive Services / Frequency Ages 64 to 68 years  Blood pressure check.** / Every 1 to 2 years.  Lipid and cholesterol check.** / Every 5 years beginning at age  22.  Clinical breast exam.** / Every 3 years for women in their 88s and 53s.  BRCA-related cancer risk assessment.** / For women who have family members with a BRCA-related cancer (breast, ovarian, tubal, or peritoneal cancers).  Pap test.** / Every 2 years from ages 90 through 51. Every 3 years starting at age 21 through age 56 or 3 with a history of 3 consecutive normal Pap tests.  HPV screening.** / Every 3 years from ages 24 through ages 1 to 46 with a history of 3 consecutive normal Pap tests.  Hepatitis C blood test.** / For any individual with known risks for hepatitis C.  Skin self-exam. / Monthly.  Influenza vaccine. / Every year.  Tetanus, diphtheria, and acellular pertussis (Tdap, Td) vaccine.** / Consult your health care provider. Pregnant women should receive 1 dose of Tdap vaccine during each pregnancy. 1 dose of Td every 10 years.  Varicella vaccine.** / Consult your health care provider. Pregnant females who do not have evidence of immunity should receive the first dose after pregnancy.  HPV vaccine. / 3 doses over 6 months, if 72 and younger. The vaccine is not recommended for use in pregnant females. However, pregnancy testing is not needed before receiving a dose.  Measles, mumps, rubella (MMR) vaccine.** / You need at least 1 dose of MMR if you were born in 1957 or later. You may also need a 2nd dose. For females of childbearing age, rubella immunity should be determined. If there is no evidence of immunity, females who are not pregnant should be vaccinated. If there is no evidence of immunity, females who are pregnant should delay immunization until after pregnancy.  Pneumococcal 13-valent conjugate (PCV13) vaccine.** / Consult your health care provider.  Pneumococcal polysaccharide (PPSV23) vaccine.** / 1 to 2 doses if you smoke cigarettes or if you have certain conditions.  Meningococcal vaccine.** /  1 dose if you are age 19 to 21 years and a first-year college  student living in a residence hall, or have one of several medical conditions, you need to get vaccinated against meningococcal disease. You may also need additional booster doses.  Hepatitis A vaccine.** / Consult your health care provider.  Hepatitis B vaccine.** / Consult your health care provider.  Haemophilus influenzae type b (Hib) vaccine.** / Consult your health care provider. Ages 40 to 64 years  Blood pressure check.** / Every 1 to 2 years.  Lipid and cholesterol check.** / Every 5 years beginning at age 20 years.  Lung cancer screening. / Every year if you are aged 55-80 years and have a 30-pack-year history of smoking and currently smoke or have quit within the past 15 years. Yearly screening is stopped once you have quit smoking for at least 15 years or develop a health problem that would prevent you from having lung cancer treatment.  Clinical breast exam.** / Every year after age 40 years.  BRCA-related cancer risk assessment.** / For women who have family members with a BRCA-related cancer (breast, ovarian, tubal, or peritoneal cancers).  Mammogram.** / Every year beginning at age 40 years and continuing for as long as you are in good health. Consult with your health care provider.  Pap test.** / Every 3 years starting at age 30 years through age 65 or 70 years with a history of 3 consecutive normal Pap tests.  HPV screening.** / Every 3 years from ages 30 years through ages 65 to 70 years with a history of 3 consecutive normal Pap tests.  Fecal occult blood test (FOBT) of stool. / Every year beginning at age 50 years and continuing until age 75 years. You may not need to do this test if you get a colonoscopy every 10 years.  Flexible sigmoidoscopy or colonoscopy.** / Every 5 years for a flexible sigmoidoscopy or every 10 years for a colonoscopy beginning at age 50 years and continuing until age 75 years.  Hepatitis C blood test.** / For all people born from 1945 through  1965 and any individual with known risks for hepatitis C.  Skin self-exam. / Monthly.  Influenza vaccine. / Every year.  Tetanus, diphtheria, and acellular pertussis (Tdap/Td) vaccine.** / Consult your health care provider. Pregnant women should receive 1 dose of Tdap vaccine during each pregnancy. 1 dose of Td every 10 years.  Varicella vaccine.** / Consult your health care provider. Pregnant females who do not have evidence of immunity should receive the first dose after pregnancy.  Zoster vaccine.** / 1 dose for adults aged 60 years or older.  Measles, mumps, rubella (MMR) vaccine.** / You need at least 1 dose of MMR if you were born in 1957 or later. You may also need a 2nd dose. For females of childbearing age, rubella immunity should be determined. If there is no evidence of immunity, females who are not pregnant should be vaccinated. If there is no evidence of immunity, females who are pregnant should delay immunization until after pregnancy.  Pneumococcal 13-valent conjugate (PCV13) vaccine.** / Consult your health care provider.  Pneumococcal polysaccharide (PPSV23) vaccine.** / 1 to 2 doses if you smoke cigarettes or if you have certain conditions.  Meningococcal vaccine.** / Consult your health care provider.  Hepatitis A vaccine.** / Consult your health care provider.  Hepatitis B vaccine.** / Consult your health care provider.  Haemophilus influenzae type b (Hib) vaccine.** / Consult your health care provider. Ages 65   years and over  Blood pressure check.** / Every 1 to 2 years.  Lipid and cholesterol check.** / Every 5 years beginning at age 22 years.  Lung cancer screening. / Every year if you are aged 73-80 years and have a 30-pack-year history of smoking and currently smoke or have quit within the past 15 years. Yearly screening is stopped once you have quit smoking for at least 15 years or develop a health problem that would prevent you from having lung cancer  treatment.  Clinical breast exam.** / Every year after age 4 years.  BRCA-related cancer risk assessment.** / For women who have family members with a BRCA-related cancer (breast, ovarian, tubal, or peritoneal cancers).  Mammogram.** / Every year beginning at age 40 years and continuing for as long as you are in good health. Consult with your health care provider.  Pap test.** / Every 3 years starting at age 9 years through age 34 or 91 years with 3 consecutive normal Pap tests. Testing can be stopped between 65 and 70 years with 3 consecutive normal Pap tests and no abnormal Pap or HPV tests in the past 10 years.  HPV screening.** / Every 3 years from ages 57 years through ages 64 or 45 years with a history of 3 consecutive normal Pap tests. Testing can be stopped between 65 and 70 years with 3 consecutive normal Pap tests and no abnormal Pap or HPV tests in the past 10 years.  Fecal occult blood test (FOBT) of stool. / Every year beginning at age 15 years and continuing until age 17 years. You may not need to do this test if you get a colonoscopy every 10 years.  Flexible sigmoidoscopy or colonoscopy.** / Every 5 years for a flexible sigmoidoscopy or every 10 years for a colonoscopy beginning at age 86 years and continuing until age 71 years.  Hepatitis C blood test.** / For all people born from [birth year] through 1964 and any individual with known risks for hepatitis C.  Osteoporosis screening.** / A one-time screening for women ages 83 years and over and women at risk for fractures or osteoporosis.  Skin self-exam. / Monthly.  Influenza vaccine. / Every year.  Tetanus, diphtheria, and acellular pertussis (Tdap/Td) vaccine.** / 1 dose of Td every 10 years.  Varicella vaccine.** / Consult your health care provider.  Zoster vaccine.** / 1 dose for adults aged 61 years or older.  Pneumococcal 13-valent conjugate (PCV13) vaccine.** / Consult your health care provider.  Pneumococcal  polysaccharide (PPSV23) vaccine.** / 1 dose for all adults aged 28 years and older.  Meningococcal vaccine.** / Consult your health care provider.  Hepatitis A vaccine.** / Consult your health care provider.  Hepatitis B vaccine.** / Consult your health care provider.  Haemophilus influenzae type b (Hib) vaccine.** / Consult your health care provider. ** Family history and personal history of risk and conditions may change your health care provider's recommendations. Document Released: 08/02/2001 Document Revised: 10/21/2013 Document Reviewed: 11/01/2010 [facility] Patient Information 2015 [place], [place]. This information is not intended to replace advice given to you by your health care provider. Make sure you discuss any questions you have with your health care provider.

## 2015-02-17 NOTE — Progress Notes (Addendum)
Subjective:   Sandra [name] is a 54 y.o. female who presents for an Initial Medicare Annual Wellness Visit.  Review of Systems    Review of Systems  Constitutional: Negative for activity change, appetite change and fatigue.  HENT: Negative for hearing loss, congestion, tinnitus and ear discharge.  dentist q70mEyes: Negative for visual disturbance (see optho q1y -- vision corrected to 20/20 with glasses).  Respiratory: Negative for cough, chest tightness and shortness of breath.   Cardiovascular: Negative for chest pain, palpitations and leg swelling.  Gastrointestinal: Negative for abdominal pain, diarrhea, constipation and abdominal distention.  Genitourinary: Negative for urgency, frequency, decreased urine volume and difficulty urinating.  Musculoskeletal: Negative for back pain, arthralgias and gait problem.  Skin: Negative for color change, pallor and rash.  Neurological: Negative for dizziness, light-headedness, numbness and headaches.  Hematological: Negative for adenopathy. Does not bruise/bleed easily.  Psychiatric/Behavioral: Negative for suicidal ideas, confusion, sleep disturbance, self-injury, dysphoric mood, decreased concentration and agitation.             Objective:    Today's Vitals   02/17/15 1528  BP: 142/90  Pulse: 81  Temp: 98.6 F (37 C)  TempSrc: Oral  Height: 5' 5.5" (1.664 m)  Weight: 201 lb 9.6 oz (91.445 kg)  SpO2: 98%  BP 142/90 mmHg  Pulse 81  Temp(Src) 98.6 F (37 C) (Oral)  Ht 5' 5.5" (1.664 m)  Wt 201 lb 9.6 oz (91.445 kg)  BMI 33.03 kg/m2  SpO2 98% General appearance: alert, cooperative, appears stated age and no distress Head: Normocephalic, without obvious abnormality, atraumatic Eyes: conjunctivae/corneas clear. PERRL, EOM's intact. Fundi benign. Ears: normal TM's and external ear canals both ears Nose: Nares normal. Septum midline. Mucosa normal. No drainage or sinus tenderness. Throat: lips, mucosa, and tongue normal; teeth  and gums normal Neck: no adenopathy, no carotid bruit, no JVD, supple, symmetrical, trachea midline and thyroid not enlarged, symmetric, no tenderness/mass/nodules Back: symmetric, no curvature. ROM normal. No CVA tenderness. Lungs: clear to auscultation bilaterally Breasts: normal appearance, no masses or tenderness Heart: regular rate and rhythm, S1, S2 normal, no murmur, click, rub or gallop Abdomen: soft, non-tender; bowel sounds normal; no masses,  no organomegaly Pelvic: deferred Extremities: extremities normal, atraumatic, no cyanosis or edema Pulses: 2+ and symmetric Skin: Skin color, texture, turgor normal. No rashes or lesions Lymph nodes: Cervical, supraclavicular, and axillary nodes normal. Neurologic: Alert and oriented X 3, normal strength and tone. Normal symmetric reflexes. Normal coordination and gait Psych- no depression, no anxiety Current Medications (verified) Outpatient Encounter Prescriptions as of 02/17/2015  Medication Sig  . allopurinol (ZYLOPRIM) 300 MG tablet TAKE 1 TABLET BY MOUTH EVERY DAY  . amLODipine (NORVASC) 5 MG tablet TAKE 2 TABLETS BY MOUTH EVERY DAY  . atorvastatin (LIPITOR) 10 MG tablet TAKE 1 TABLET BY MOUTH EVERY DAY AT 6PM  . BENICAR 40 MG tablet Take 1 tablet by mouth daily.  . Carboxymethylcellulose Sodium 1 % GEL Apply 1 drop to eye.  . clopidogrel (PLAVIX) 75 MG tablet TAKE 1 TABLET BY MOUTH EVERY DAY  . eszopiclone (LUNESTA) 2 MG TABS tablet Take 1 tablet (2 mg total) by mouth at bedtime as needed. for sleep  . LIPOFEN 50 MG CAPS TAKE 1 CAPSULE BY MOUTH EVERY DAY  . potassium chloride (MICRO-K) 10 MEQ CR capsule Take 2 capsules (20 mEq total) by mouth daily.  . prednisoLONE acetate (PRED FORTE) 1 % ophthalmic suspension 1 drop 4 (four) times daily.  .Marland KitchentiZANidine (ZANAFLEX) 4  MG tablet TAKE 1/2 TO 1 TABLET BY MOUTH EVERY 8 HOURS AS NEEDED FOR MUSCLE SPASMS  . [DISCONTINUED] ARIPiprazole (ABILIFY) 5 MG tablet Take 5 mg by mouth at bedtime.  .  [DISCONTINUED] busPIRone (BUSPAR) 10 MG tablet Take 10 mg by mouth daily.  . [DISCONTINUED] clonazePAM (KLONOPIN) 1 MG tablet Take 1 mg by mouth 2 (two) times daily.  . [DISCONTINUED] DULoxetine (CYMBALTA) 20 MG capsule   . [DISCONTINUED] metoprolol (LOPRESSOR) 50 MG tablet TAKE 1 TABLET BY MOUTH TWICE DAILY  . [DISCONTINUED] olmesartan (BENICAR) 20 MG tablet Take 1 tablet (20 mg total) by mouth daily. (Patient taking differently: Take 40 mg by mouth daily. )  . metoprolol (LOPRESSOR) 100 MG tablet Take 1 tablet (100 mg total) by mouth 2 (two) times daily.   No facility-administered encounter medications on file as of 02/17/2015.    Allergies (verified) Azor and Cozaar   History: Past Medical History  Diagnosis Date  . PFO (patent foramen ovale) August 2012    PFO seen on TEE during hospitalization in 01/2011.  Patient to f/u with Dr. Leonie Man, neurology, for enrollment in trial for medical treatment of PFO  . CAD (coronary artery disease) 2007    istory of MI with stent in 2007 by Dr. Chancy Milroy, [facility]. Stent placement by Dr Cleatis Polka   . Prediabetes 03/07/2011  . Hypertension 03/07/2011  . Hyperlipidemia 03/07/2011  . Tobacco abuse 03/07/2011  . Depression 03/07/2011  . Psoriasis 03/07/2011  . Acute right MCA stroke 01/27/11    Acute R MCA stroked  with L arm and leg weakness  . Anxiety 1980s    On Klonipin since age 38. Had addiction problem with Xanax which  Was therefore d/c . Seen in the past at [facility].   . Fractured toe 01/2011    History of left fifth toe proximal phalanx fracture when patient had a stroke (01/2011) and fell. Seen by Dr Doran Durand.   . Incontinence 04/14/2011  . Arthritis 04/16/2011  . Tobacco abuse 03/07/2011    Quit 01/2011   . Fracture of fifth toe, left, closed 01/28/2011    History of left fifth toe proximal phalanx fracture when patient had a stroke (01/2011) and fell. Seen by Dr Doran Durand.    . Substance abuse     h/o narcotic abuse pt denies as of  01/09/12  . Myocardial infarct, old 2008    stents  . Blind right eye   . Pneumonia    Past Surgical History  Procedure Laterality Date  . Cardiac stent      2008  . Eye surgery     Family History  Problem Relation Age of Onset  . Heart disease Mother   . Diabetes Mother   . Heart disease Father   . Diabetes Father   . Heart disease Brother   . Diabetes Brother   . Heart disease Maternal Grandmother   . Heart disease Maternal Grandfather   . Heart disease Paternal Grandmother   . Heart disease Paternal Grandfather   . Lung cancer Brother   . Heart disease Brother   . Cancer Sister 56    ovarian   Social History   Occupational History  . Disabled    Social History Main Topics  . Smoking status: Current Some Day Smoker -- 0.50 packs/day for 30 years    Types: Cigarettes    Last Attempt to Quit: 04/07/2014  . Smokeless tobacco: Never Used     Comment: Trying to quit again.  Marland Kitchen  Alcohol Use: No  . Drug Use: No  . Sexual Activity:    Partners: Male    Tobacco Counseling Ready to quit: Not Answered Counseling given: Not Answered   Activities of Daily Living In your present state of health, do you have any difficulty performing the following activities: 08/22/2014 04/08/2014  Hearing? N N  Vision? Y -  Difficulty concentrating or making decisions? N N  Walking or climbing stairs? Y Y  Dressing or bathing? N Y  Doing errands, shopping? Tempie Donning    Immunizations and Health Maintenance Immunization History  Administered Date(s) Administered  . Influenza Split 04/21/2011, 02/29/2012  . Influenza,inj,Quad PF,36+ Mos 03/01/2013  . Pneumococcal Polysaccharide-23 01/29/2011  . Tdap 06/29/2011   Health Maintenance Due  Topic Date Due  . Hepatitis C Screening  1961/01/05  . PAP SMEAR  06/28/2013    Patient Care Team: Rosalita Chessman, DO as PCP - General (Family Medicine) Lorretta Harp, MD as Consulting Physician (Cardiology) Garvin Fila, MD as Consulting  Physician (Neurology)  Indicate any recent Medical Services you may have received from other than [facility] providers in the past year (date may be approximate).     Assessment:   This is a routine wellness examination for [place].   Hearing/Vision screen No exam data present  Dietary issues and exercise activities discussed:    Goals    . Blood Pressure < 130/80    . LDL CALC < 70    . Quit smoking / using tobacco      Depression Screen PHQ 2/9 Scores 08/22/2014 04/08/2014 03/25/2014 01/03/2014 11/04/2013 08/12/2013 05/27/2013  PHQ - 2 Score 0 2 4 4  0 0 0  PHQ- 9 Score - 6 13 12  - - -    Fall Risk Fall Risk  08/22/2014 04/08/2014 03/25/2014 01/03/2014 11/04/2013  Falls in the past year? No No No Yes Yes  Number falls in past yr: - - - 1 1  Injury with Fall? - - - Yes No  Risk Factor Category  - - - High Fall Risk High Fall Risk  Risk for fall due to : - History of fall(s);Impaired balance/gait History of fall(s);Impaired balance/gait History of fall(s);Impaired balance/gait Impaired balance/gait;Impaired vision  Risk for fall due to (comments): - - - - -    Cognitive Function: No flowsheet data found.  Screening Tests Health Maintenance  Topic Date Due  . Hepatitis C Screening  1960/11/04  . PAP SMEAR  06/28/2013  . INFLUENZA VACCINE  03/20/2015 (Originally 01/19/2015)  . LIPID PANEL  08/22/2015  . PNEUMOCOCCAL POLYSACCHARIDE VACCINE (2) 01/29/2016  . MAMMOGRAM  08/26/2016  . COLONOSCOPY  06/25/2017  . TETANUS/TDAP  06/28/2021  . HIV Screening  Completed      Plan:   see AVS  During the course of the visit, Ardis was educated and counseled about the following appropriate screening and preventive services:   Vaccines to include Pneumoccal, Influenza, Hepatitis B, Td, Zostavax, HCV  Electrocardiogram  Cardiovascular disease screening  Colorectal cancer screening  Bone density screening  Diabetes screening  Glaucoma screening  Mammography/PAP  Nutrition  counseling  Smoking cessation counseling  Patient Instructions (the written plan) were given to the patient.  1. Essential hypertension norvasc and benicar - CBC with Differential/Platelet; Future - Comprehensive metabolic panel; Future - TSH; Future  2. Hyperlipidemia con't lipitor - Lipid panel; Future  3. Hypokalemia   - Comprehensive metabolic panel; Future  4. Tobacco use disorder   - Ambulatory Referral for Lung  Cancer Scre  5. Preventative health care See avs ghm utd Check labs - CBC with Differential/Platelet; Future - Comprehensive metabolic panel; Future - Hepatitis C antibody; Future - Lipid panel; Future - POCT urinalysis dipstick; Future - TSH; Future  6. Routine history and physical examination of adult    7. Cervical cancer screening   - Cytology - PAP   Elizabeth Sauer, LPN   0/92/0041

## 2015-02-17 NOTE — Progress Notes (Signed)
Pre visit review using our clinic review tool, if applicable. No additional management support is needed unless otherwise documented below in the visit note. 

## 2015-02-18 ENCOUNTER — Other Ambulatory Visit: Payer: Self-pay | Admitting: Family Medicine

## 2015-02-18 NOTE — Telephone Encounter (Signed)
Received request for benicar 40mg .  Looks like that was prescribed by someone other than pt's pcp. Denial sent to pharmacy.

## 2015-02-19 ENCOUNTER — Other Ambulatory Visit: Payer: Self-pay | Admitting: Internal Medicine

## 2015-02-19 LAB — CYTOLOGY - PAP

## 2015-02-22 NOTE — Progress Notes (Deleted)
[name], female    DOB: [birth year]  Age: 54 y.o. MRN: [id number]    Subjective:  Subjective HPI Billee Balcerzak presents for ***  Review of Systems  Constitutional: Negative for diaphoresis, appetite change, fatigue and unexpected weight change.  Eyes: Negative for pain, redness and visual disturbance.  Respiratory: Negative for cough, chest tightness, shortness of breath and wheezing.   Cardiovascular: Negative for chest pain, palpitations and leg swelling.  Endocrine: Negative for cold intolerance, heat intolerance, polydipsia, polyphagia and polyuria.  Genitourinary: Negative for dysuria, frequency and difficulty urinating.  Neurological: Negative for dizziness, light-headedness, numbness and headaches.    History Past Medical History  Diagnosis Date  . PFO (patent foramen ovale) August 2012    PFO seen on TEE during hospitalization in 01/2011.  Patient to f/u with Dr. Leonie Man, neurology, for enrollment in trial for medical treatment of PFO  . CAD (coronary artery disease) 2007    istory of MI with stent in 2007 by Dr. Chancy Milroy, [facility]. Stent placement by Dr Cleatis Polka   . Prediabetes 03/07/2011  . Hypertension 03/07/2011  . Hyperlipidemia 03/07/2011  . Tobacco abuse 03/07/2011  . Depression 03/07/2011  . Psoriasis 03/07/2011  . Acute right MCA stroke 01/27/11    Acute R MCA stroked  with L arm and leg weakness  . Anxiety 1980s    On Klonipin since age 89. Had addiction problem with Xanax which  Was therefore d/c . Seen [facility] the [facility] at [facility].   . Fractured toe 01/2011    History of left fifth toe proximal phalanx fracture when patient had a stroke (01/2011) and fell. Seen by Dr Doran Durand.   . Incontinence 04/14/2011  . Arthritis 04/16/2011  . Tobacco abuse 03/07/2011    Quit 01/2011   . Fracture of fifth toe, left, closed 01/28/2011    History of left fifth toe proximal phalanx fracture when patient had a stroke (01/2011) and fell. Seen by Dr Doran Durand.    .  Substance abuse     h/o narcotic abuse pt denies as of 01/09/12  . Myocardial infarct, old 2008    stents  . Blind right eye   . Pneumonia     She has past surgical history that includes cardiac stent and Eye surgery.   Her family history includes Cancer (age of onset: 28) in her sister; Diabetes in her brother, father, and mother; Heart disease in her brother, brother, father, maternal grandfather, maternal grandmother, mother, paternal grandfather, and paternal grandmother; Lung cancer in her brother.She reports that she has been smoking Cigarettes.  She has a 15 pack-year smoking history. She has never used smokeless tobacco. She reports that she does not drink alcohol or use illicit drugs.  Current Outpatient Prescriptions on File Prior to Visit  Medication Sig Dispense Refill  . allopurinol (ZYLOPRIM) 300 MG tablet TAKE 1 TABLET BY MOUTH EVERY DAY 30 tablet 6  . amLODipine (NORVASC) 5 MG tablet TAKE 2 TABLETS BY MOUTH EVERY DAY 60 tablet 6  . atorvastatin (LIPITOR) 10 MG tablet TAKE 1 TABLET BY MOUTH EVERY DAY AT 6PM 30 tablet 6  . Carboxymethylcellulose Sodium 1 % GEL Apply 1 drop to eye.    . clopidogrel (PLAVIX) 75 MG tablet TAKE 1 TABLET BY MOUTH EVERY DAY 30 tablet 6  . LIPOFEN 50 MG CAPS TAKE 1 CAPSULE BY MOUTH EVERY DAY 30 capsule 2  . potassium chloride (MICRO-K) 10 MEQ CR capsule Take 2 capsules (20 mEq total) by mouth daily. [place]  capsule 11  . prednisoLONE acetate (PRED FORTE) 1 % ophthalmic suspension 1 drop 4 (four) times daily.    Marland Kitchen tiZANidine (ZANAFLEX) 4 MG tablet TAKE 1/2 TO 1 TABLET BY MOUTH EVERY 8 HOURS AS NEEDED FOR MUSCLE SPASMS 270 tablet 0   No current facility-administered medications on file prior to visit.     Objective:  Objective Physical Exam  Constitutional: She is oriented to person, place, and time. She appears well-developed and well-nourished.  HENT:  Head: Normocephalic and atraumatic.  Eyes: Conjunctivae and EOM are normal.  Neck: Normal range  of motion. Neck supple. No JVD present. Carotid bruit is not present. No thyromegaly present.  Cardiovascular: Normal rate, regular rhythm and normal heart sounds.   No murmur heard. Pulmonary/Chest: Effort normal and breath sounds normal. No respiratory distress. She has no wheezes. She has no rales. She exhibits no tenderness.  Musculoskeletal: She exhibits no edema.  Neurological: She is alert and oriented to person, place, and time.  Psychiatric: She has a normal mood and affect. Her behavior is normal.   BP 142/90 mmHg  Pulse 81  Temp(Src) 98.6 F (37 C) (Oral)  Ht 5' 5.5" (1.664 m)  Wt 201 lb 9.6 oz (91.445 kg)  BMI 33.03 kg/m2  SpO2 98% Wt Readings from Last 3 Encounters:  02/17/15 201 lb 9.6 oz (91.445 kg)  01/12/15 230 lb (104.327 kg)  11/16/14 205 lb (92.987 kg)     Lab Results  Component Value Date   WBC 11.6* 01/12/2015   HGB 14.1 01/12/2015   HCT 43.0 01/12/2015   PLT 315 01/12/2015   GLUCOSE 134* 01/12/2015   CHOL 150 08/22/2014   TRIG 150* 08/22/2014   HDL 30* 08/22/2014   LDLCALC 90 08/22/2014   ALT 15 04/08/2014   AST 10 04/08/2014   NA 141 01/12/2015   K 4.2 01/12/2015   CL 108 01/12/2015   CREATININE 0.75 01/12/2015   BUN 16 01/12/2015   CO2 21* 01/12/2015   TSH 2.337 11/25/2013   INR 1.03 01/27/2011   HGBA1C 5.8 08/12/2013    No results found.   Assessment & Plan:  Plan I have discontinued Ms. Havener's DULoxetine, busPIRone, clonazePAM, ARIPiprazole, olmesartan, and metoprolol. I am also having her start on metoprolol. Additionally, I am having her maintain her prednisoLONE acetate, Carboxymethylcellulose Sodium, LIPOFEN, potassium chloride, atorvastatin, allopurinol, amLODipine, clopidogrel, tiZANidine, and eszopiclone.  Meds ordered this encounter  Medications  . DISCONTD: BENICAR 40 MG tablet    Sig: Take 1 tablet by mouth daily.  . metoprolol (LOPRESSOR) 100 MG tablet    Sig: Take 1 tablet (100 mg total) by mouth 2 (two) times daily.     Dispense:  60 tablet    Refill:  5  . eszopiclone (LUNESTA) 2 MG TABS tablet    Sig: Take 1 tablet (2 mg total) by mouth at bedtime as needed. for sleep    Dispense:  30 tablet    Refill:  2    Problem List Items Addressed This Visit    Hypokalemia   Relevant Orders   Comprehensive metabolic panel   Hyperlipidemia   Relevant Medications   metoprolol (LOPRESSOR) 100 MG tablet   Other Relevant Orders   Lipid panel   Essential hypertension - Primary (Chronic)   Relevant Medications   metoprolol (LOPRESSOR) 100 MG tablet   Other Relevant Orders   CBC with Differential/Platelet   Comprehensive metabolic panel   TSH    Other Visit Diagnoses    Tobacco use  disorder        Relevant Orders    Ambulatory Referral for Lung Cancer Scre    Preventative health care        Relevant Orders    CBC with Differential/Platelet    Comprehensive metabolic panel    Hepatitis C antibody    Lipid panel    POCT urinalysis dipstick    TSH    Routine history and physical examination of adult        Cervical cancer screening        Relevant Orders    Cytology - PAP (Completed)       Follow-up: Return in about 3 months (around 05/20/2015), or if symptoms worsen or fail to improve, for hypertension.  Garnet Koyanagi, DO

## 2015-02-25 ENCOUNTER — Telehealth: Payer: Self-pay | Admitting: Family Medicine

## 2015-02-25 DIAGNOSIS — I639 Cerebral infarction, unspecified: Secondary | ICD-10-CM

## 2015-02-25 NOTE — Telephone Encounter (Signed)
Jenn, does this go to you?

## 2015-02-25 NOTE — Telephone Encounter (Signed)
Caller name: Jackelyn Poling [facility])  Relationship to patient:  Can be reached: [phone] Pharmacy:  Reason for call: pt's DX I63.9     Need PA to see Dr. Leonie Man, NPI: [id number]

## 2015-02-26 NOTE — Telephone Encounter (Signed)
Ok to put in referral--- hx cva

## 2015-02-26 NOTE — Addendum Note (Signed)
Addended by: Ewing Schlein on: 02/26/2015 10:19 AM   Modules accepted: Orders

## 2015-02-26 NOTE — Telephone Encounter (Signed)
Ref has been placed.     KP 

## 2015-03-03 ENCOUNTER — Ambulatory Visit: Payer: Medicare HMO | Admitting: Neurology

## 2015-03-17 ENCOUNTER — Emergency Department (HOSPITAL_BASED_OUTPATIENT_CLINIC_OR_DEPARTMENT_OTHER): Payer: Commercial Managed Care - HMO

## 2015-03-17 ENCOUNTER — Encounter (HOSPITAL_BASED_OUTPATIENT_CLINIC_OR_DEPARTMENT_OTHER): Payer: Self-pay | Admitting: Adult Health

## 2015-03-17 ENCOUNTER — Emergency Department (HOSPITAL_BASED_OUTPATIENT_CLINIC_OR_DEPARTMENT_OTHER)
Admission: EM | Admit: 2015-03-17 | Discharge: 2015-03-17 | Disposition: A | Discharge status: Home / Self Care | Payer: Commercial Managed Care - HMO | Attending: Emergency Medicine | Admitting: Emergency Medicine

## 2015-03-17 DIAGNOSIS — Z8781 Personal history of (healed) traumatic fracture: Secondary | ICD-10-CM | POA: Diagnosis not present

## 2015-03-17 DIAGNOSIS — H26061 Combined forms of infantile and juvenile cataract, right eye: Secondary | ICD-10-CM | POA: Diagnosis not present

## 2015-03-17 DIAGNOSIS — Z8659 Personal history of other mental and behavioral disorders: Secondary | ICD-10-CM | POA: Diagnosis not present

## 2015-03-17 DIAGNOSIS — Z872 Personal history of diseases of the skin and subcutaneous tissue: Secondary | ICD-10-CM | POA: Insufficient documentation

## 2015-03-17 DIAGNOSIS — R05 Cough: Secondary | ICD-10-CM | POA: Diagnosis not present

## 2015-03-17 DIAGNOSIS — Z9861 Coronary angioplasty status: Secondary | ICD-10-CM | POA: Insufficient documentation

## 2015-03-17 DIAGNOSIS — E785 Hyperlipidemia, unspecified: Secondary | ICD-10-CM | POA: Diagnosis not present

## 2015-03-17 DIAGNOSIS — H40052 Ocular hypertension, left eye: Secondary | ICD-10-CM | POA: Diagnosis not present

## 2015-03-17 DIAGNOSIS — Z8673 Personal history of transient ischemic attack (TIA), and cerebral infarction without residual deficits: Secondary | ICD-10-CM | POA: Diagnosis not present

## 2015-03-17 DIAGNOSIS — Z79899 Other long term (current) drug therapy: Secondary | ICD-10-CM | POA: Insufficient documentation

## 2015-03-17 DIAGNOSIS — I252 Old myocardial infarction: Secondary | ICD-10-CM | POA: Diagnosis not present

## 2015-03-17 DIAGNOSIS — H5441 Blindness, right eye, normal vision left eye: Secondary | ICD-10-CM | POA: Insufficient documentation

## 2015-03-17 DIAGNOSIS — Z8701 Personal history of pneumonia (recurrent): Secondary | ICD-10-CM | POA: Insufficient documentation

## 2015-03-17 DIAGNOSIS — Z7952 Long term (current) use of systemic steroids: Secondary | ICD-10-CM | POA: Insufficient documentation

## 2015-03-17 DIAGNOSIS — M1990 Unspecified osteoarthritis, unspecified site: Secondary | ICD-10-CM | POA: Insufficient documentation

## 2015-03-17 DIAGNOSIS — J209 Acute bronchitis, unspecified: Secondary | ICD-10-CM | POA: Diagnosis not present

## 2015-03-17 DIAGNOSIS — H44003 Unspecified purulent endophthalmitis, bilateral: Secondary | ICD-10-CM | POA: Diagnosis not present

## 2015-03-17 DIAGNOSIS — I10 Essential (primary) hypertension: Secondary | ICD-10-CM | POA: Diagnosis not present

## 2015-03-17 DIAGNOSIS — H35032 Hypertensive retinopathy, left eye: Secondary | ICD-10-CM | POA: Diagnosis not present

## 2015-03-17 DIAGNOSIS — Z720 Tobacco use: Secondary | ICD-10-CM | POA: Insufficient documentation

## 2015-03-17 DIAGNOSIS — I2510 Atherosclerotic heart disease of native coronary artery without angina pectoris: Secondary | ICD-10-CM | POA: Insufficient documentation

## 2015-03-17 MED ORDER — ALBUTEROL SULFATE HFA 108 (90 BASE) MCG/ACT IN AERS: 2.0000 | INHALATION_SPRAY | RESPIRATORY_TRACT | Status: DC | PRN

## 2015-03-17 MED ORDER — PREDNISONE 10 MG PO TABS
60.0000 mg | ORAL_TABLET | Freq: Once | ORAL | Status: AC
  Administered 2015-03-17: 60 mg via ORAL
  Filled 2015-03-17 (×2): qty 1

## 2015-03-17 MED ORDER — IPRATROPIUM-ALBUTEROL 0.5-2.5 (3) MG/3ML IN SOLN
3.0000 mL | Freq: Once | RESPIRATORY_TRACT | Status: AC
  Administered 2015-03-17: 3 mL via RESPIRATORY_TRACT
  Filled 2015-03-17: qty 3

## 2015-03-17 MED ORDER — PREDNISONE 50 MG PO TABS: 50.0000 mg | ORAL_TABLET | Freq: Every day | ORAL | Status: DC

## 2015-03-17 NOTE — ED Notes (Signed)
Patient transported to X-ray 

## 2015-03-17 NOTE — ED Provider Notes (Signed)
CSN: [id number]     Arrival date & time 03/17/15  0553 History   First MD Initiated Contact with Patient 03/17/15 [phone]     Chief Complaint  Patient presents with  . Cough     (Consider location/radiation/quality/duration/timing/severity/associated sxs/prior Treatment) Patient is a 54 y.o. female presenting with cough. The history is provided by the patient.  Cough She has been coughing for the last 4 days. Cough is productive of a small amount of clear sputum. There's been some nasal congestion with some clear rhinorrhea. She denies fever or chills. She chronically has problems with sweating and that has not been any different with this illness. She denies nausea, vomiting, diarrhea and denies arthralgias or myalgias. She denies any sick contacts, but her husband has had a similar illness over the same time period. She is a cigarette smoker.  Past Medical History  Diagnosis Date  . PFO (patent foramen ovale) August 2012    PFO seen on TEE during hospitalization in 01/2011.  Patient to f/u with Dr. Leonie Man, neurology, for enrollment in trial for medical treatment of PFO  . CAD (coronary artery disease) 2007    istory of MI with stent in 2007 by Dr. Chancy Milroy, [facility]. Stent placement by Dr Cleatis Polka   . Prediabetes 03/07/2011  . Hypertension 03/07/2011  . Hyperlipidemia 03/07/2011  . Tobacco abuse 03/07/2011  . Depression 03/07/2011  . Psoriasis 03/07/2011  . Acute right MCA stroke 01/27/11    Acute R MCA stroked  with L arm and leg weakness  . Anxiety 1980s    On Klonipin since age 59. Had addiction problem with Xanax which  Was therefore d/c . Seen in the past at [facility].   . Fractured toe 01/2011    History of left fifth toe proximal phalanx fracture when patient had a stroke (01/2011) and fell. Seen by Dr Doran Durand.   . Incontinence 04/14/2011  . Arthritis 04/16/2011  . Tobacco abuse 03/07/2011    Quit 01/2011   . Fracture of fifth toe, left, closed 01/28/2011    History of left  fifth toe proximal phalanx fracture when patient had a stroke (01/2011) and fell. Seen by Dr Doran Durand.    . Substance abuse     h/o narcotic abuse pt denies as of 01/09/12  . Myocardial infarct, old 2008    stents  . Blind right eye   . Pneumonia    Past Surgical History  Procedure Laterality Date  . Cardiac stent      2008  . Eye surgery     Family History  Problem Relation Age of Onset  . Heart disease Mother   . Diabetes Mother   . Heart disease Father   . Diabetes Father   . Heart disease Brother   . Diabetes Brother   . Heart disease Maternal Grandmother   . Heart disease Maternal Grandfather   . Heart disease Paternal Grandmother   . Heart disease Paternal Grandfather   . Lung cancer Brother   . Heart disease Brother   . Cancer Sister 11    ovarian   Social History  Substance Use Topics  . Smoking status: Current Some Day Smoker -- 0.50 packs/day for 30 years    Types: Cigarettes    Last Attempt to Quit: 04/07/2014  . Smokeless tobacco: Never Used     Comment: Trying to quit again.  . Alcohol Use: No   OB History    No data available     Review of  Systems  Respiratory: Positive for cough.   All other systems reviewed and are negative.     Allergies  Azor and Cozaar  Home Medications   Prior to Admission medications   Medication Sig Start Date End Date Taking? Authorizing Provider  allopurinol (ZYLOPRIM) 300 MG tablet TAKE 1 TABLET BY MOUTH EVERY DAY 11/07/14   Sid Falcon, MD  amLODipine (NORVASC) 5 MG tablet TAKE 2 TABLETS BY MOUTH EVERY DAY 11/07/14   Sid Falcon, MD  atorvastatin (LIPITOR) 10 MG tablet TAKE 1 TABLET BY MOUTH EVERY DAY AT [facility] 11/07/14   Sid Falcon, MD  BENICAR 40 MG tablet TAKE 1 TABLET BY MOUTH DAILY 02/19/15   Axel Filler, MD  Carboxymethylcellulose Sodium 1 % GEL Apply 1 drop to eye. 03/14/14   Historical Provider, MD  clopidogrel (PLAVIX) 75 MG tablet TAKE 1 TABLET BY MOUTH EVERY DAY 11/07/14   Sid Falcon, MD   eszopiclone (LUNESTA) 2 MG TABS tablet Take 1 tablet (2 mg total) by mouth at bedtime as needed. for sleep 02/17/15   Rosalita Chessman, DO  LIPOFEN 50 MG CAPS TAKE 1 CAPSULE BY MOUTH EVERY DAY 08/15/14   Sid Falcon, MD  metoprolol (LOPRESSOR) 100 MG tablet Take 1 tablet (100 mg total) by mouth 2 (two) times daily. 02/17/15   Rosalita Chessman, DO  potassium chloride (MICRO-K) 10 MEQ CR capsule Take 2 capsules (20 mEq total) by mouth daily. 09/15/14   Oval Linsey, MD  prednisoLONE acetate (PRED FORTE) 1 % ophthalmic suspension 1 drop 4 (four) times daily.    Historical Provider, MD  tiZANidine (ZANAFLEX) 4 MG tablet TAKE 1/2 TO 1 TABLET BY MOUTH EVERY 8 HOURS AS NEEDED FOR MUSCLE SPASMS 11/28/14   Garvin Fila, MD   BP 197/96 mmHg  Pulse 62  Temp(Src) 98.3 F (36.8 C) (Oral)  Resp 20  Ht 5\' 5"  (1.651 m)  Wt 200 lb (90.719 kg)  BMI 33.28 kg/m2  SpO2 100% Physical Exam  Nursing note and vitals reviewed.  54 year old female, resting comfortably and in no acute distress. Vital signs are significant for hypertension. Oxygen saturation is 100%, which is normal. Head is normocephalic and atraumatic. PERRLA, EOMI. Oropharynx is clear. There is no sinus tenderness. Neck is nontender and supple without adenopathy or JVD. Back is nontender and there is no CVA tenderness. Lungs have a prolonged exhalation phase, and slight wheezing is noted with forced exhalation. There are no rales or rhonchi Chest is nontender. Heart has regular rate and rhythm without murmur. Abdomen is soft, flat, nontender without masses or hepatosplenomegaly and peristalsis is normoactive. Extremities have no cyanosis or edema, full range of motion is present. Skin is warm and dry without rash. Neurologic: Mental status is normal, cranial nerves are intact, there are no motor or sensory deficits. \ ED Course  Procedures (including critical care time)  Imaging Review Dg Chest 2 View  03/17/2015   CLINICAL DATA:  Cough.   EXAM: CHEST  2 VIEW  COMPARISON:  04/07/2014  FINDINGS: Few linear opacities in the left mid lung, similar to prior and consistent with mild scarring.  There is no edema, consolidation, effusion, or pneumothorax.  Normal heart size and aortic contours.  IMPRESSION: No active cardiopulmonary disease.   Electronically Signed   By: Monte Fantasia M.D.   On: 03/17/2015 06:58   I have personally reviewed and evaluated these images as part of my medical decision-making.  MDM   Final  diagnoses:  Acute bronchitis, unspecified organism  Essential hypertension  Tobacco abuse    Respiratory tract infection which is probably acute bronchitis and probably viral. Chest x-ray will be obtained to rule out pneumonia and she is given a nebulizer treatment with albuterol with ipratropium. Old records are reviewed and I do not see any similar past visits.  She feels significantly better following above-noted treatment. On reexam, lungs are completely clear. Chest x-ray shows no evidence of pneumonia. She is discharged with prescriptions for prednisone and albuterol inhaler. She is encouraged to get influenza immunization as soon as she is over this infection. She is advised to stop smoking.  Delora Fuel, MD [id number]/11 [id number]

## 2015-03-17 NOTE — ED Notes (Signed)
Neb complete

## 2015-03-17 NOTE — ED Notes (Signed)
Pt appears in a hurry, but ambulatory. Pt states they understand discharge instructions and how to use albuterol inhaler.

## 2015-03-17 NOTE — ED Notes (Signed)
Dr. Roxanne Mins at [facility] speaking with pt and spouse (seeing both pts together).

## 2015-03-17 NOTE — ED Notes (Addendum)
Presents with cough-same as husband for the past 2 days-denies fever, denies pain. Denies sneezing, denies diarrhea, denies nausea.  208/85-reports she forget to take her Norvasc today. Denies headache, dizziness, chest pain.

## 2015-03-17 NOTE — ED Notes (Signed)
Pt to xray

## 2015-03-17 NOTE — Discharge Instructions (Signed)
Make sure to get your flu shot as soon as you're over this infection.   Acute Bronchitis Bronchitis is inflammation of the airways that extend from the windpipe into the lungs (bronchi). The inflammation often causes mucus to develop. This leads to a cough, which is the most common symptom of bronchitis.  In acute bronchitis, the condition usually develops suddenly and goes away over time, usually in a couple weeks. Smoking, allergies, and asthma can make bronchitis worse. Repeated episodes of bronchitis may cause further lung problems.  CAUSES Acute bronchitis is most often caused by the same virus that causes a cold. The virus can spread from person to person (contagious) through coughing, sneezing, and touching contaminated objects. SIGNS AND SYMPTOMS   Cough.   Fever.   Coughing up mucus.   Body aches.   Chest congestion.   Chills.   Shortness of breath.   Sore throat.  DIAGNOSIS  Acute bronchitis is usually diagnosed through a physical exam. Your health care provider will also ask you questions about your medical history. Tests, such as chest X-rays, are sometimes done to rule out other conditions.  TREATMENT  Acute bronchitis usually goes away in a couple weeks. Oftentimes, no medical treatment is necessary. Medicines are sometimes given for relief of fever or cough. Antibiotic medicines are usually not needed but may be prescribed in certain situations. In some cases, an inhaler may be recommended to help reduce shortness of breath and control the cough. A cool mist vaporizer may also be used to help thin bronchial secretions and make it easier to clear the chest.  HOME CARE INSTRUCTIONS  Get plenty of rest.   Drink enough fluids to keep your urine clear or pale yellow (unless you have a medical condition that requires fluid restriction). Increasing fluids may help thin your respiratory secretions (sputum) and reduce chest congestion, and it will prevent dehydration.    Take medicines only as directed by your health care provider.  If you were prescribed an antibiotic medicine, finish it all even if you start to feel better.  Avoid smoking and secondhand smoke. Exposure to cigarette smoke or irritating chemicals will make bronchitis worse. If you are a smoker, consider using nicotine gum or skin patches to help control withdrawal symptoms. Quitting smoking will help your lungs heal faster.   Reduce the chances of another bout of acute bronchitis by washing your hands frequently, avoiding people with cold symptoms, and trying not to touch your hands to your mouth, nose, or eyes.   Keep all follow-up visits as directed by your health care provider.  SEEK MEDICAL CARE IF: Your symptoms do not improve after 1 week of treatment.  SEEK IMMEDIATE MEDICAL CARE IF:  You develop an increased fever or chills.   You have chest pain.   You have severe shortness of breath.  You have bloody sputum.   You develop dehydration.  You faint or repeatedly feel like you are going to pass out.  You develop repeated vomiting.  You develop a severe headache. MAKE SURE YOU:   Understand these instructions.  Will watch your condition.  Will get help right away if you are not doing well or get worse. Document Released: 07/14/2004 Document Revised: 10/21/2013 Document Reviewed: 11/27/2012 [facility] Patient Information 2015 [place], [place]. This information is not intended to replace advice given to you by your health care provider. Make sure you discuss any questions you have with your health care provider.  Albuterol inhalation aerosol What is this medicine?  ALBUTEROL (al [place]) is a bronchodilator. It helps open up the airways in your lungs to make it easier to breathe. This medicine is used to treat and to prevent bronchospasm. This medicine may be used for other purposes; ask your health care provider or pharmacist if you have questions. COMMON  BRAND NAME(S): Proair HFA, Proventil, Proventil HFA, Respirol, Ventolin, Ventolin HFA What should I tell my health care provider before I take this medicine? They need to know if you have any of the following conditions: -diabetes -heart disease or irregular heartbeat -high blood pressure -pheochromocytoma -seizures -thyroid disease -an unusual or allergic reaction to albuterol, levalbuterol, sulfites, other medicines, foods, dyes, or preservatives -pregnant or trying to get pregnant -breast-feeding How should I use this medicine? This medicine is for inhalation through the mouth. Follow the directions on your prescription label. Take your medicine at regular intervals. Do not use more often than directed. Make sure that you are using your inhaler correctly. Ask you doctor or health care provider if you have any questions. Talk to your pediatrician regarding the use of this medicine in children. Special care may be needed. Overdosage: If you think you have taken too much of this medicine contact a poison control center or emergency room at once. NOTE: This medicine is only for you. Do not share this medicine with others. What if I miss a dose? If you miss a dose, use it as soon as you can. If it is almost time for your next dose, use only that dose. Do not use double or extra doses. What may interact with this medicine? -anti-infectives like chloroquine and pentamidine -caffeine -cisapride -diuretics -medicines for colds -medicines for depression or for emotional or psychotic conditions -medicines for weight loss including some herbal products -methadone -some antibiotics like clarithromycin, erythromycin, levofloxacin, and linezolid -some heart medicines -steroid hormones like dexamethasone, cortisone, hydrocortisone -theophylline -thyroid hormones This list may not describe all possible interactions. Give your health care provider a list of all the medicines, herbs,  non-prescription drugs, or dietary supplements you use. Also tell them if you smoke, drink alcohol, or use illegal drugs. Some items may interact with your medicine. What should I watch for while using this medicine? Tell your doctor or health care professional if your symptoms do not improve. Do not use extra albuterol. If your asthma or bronchitis gets worse while you are using this medicine, call your doctor right away. If your mouth gets dry try chewing sugarless gum or sucking hard candy. Drink water as directed. What side effects may I notice from receiving this medicine? Side effects that you should report to your doctor or health care professional as soon as possible: -allergic reactions like skin rash, itching or hives, swelling of the face, lips, or tongue -breathing problems -chest pain -feeling faint or lightheaded, falls -high blood pressure -irregular heartbeat -fever -muscle cramps or weakness -pain, tingling, numbness in the hands or feet -vomiting Side effects that usually do not require medical attention (report to your doctor or health care professional if they continue or are bothersome): -cough -difficulty sleeping -headache -nervousness or trembling -stomach upset -stuffy or runny nose -throat irritation -unusual taste This list may not describe all possible side effects. Call your doctor for medical advice about side effects. You may report side effects to FDA at 1-800-FDA-1088. Where should I keep my medicine? Keep out of the reach of children. Store at room temperature between 15 and 30 degrees C (59 and 86 degrees F).  The contents are under pressure and may burst when exposed to heat or flame. Do not freeze. This medicine does not work as well if it is too cold. Throw away any unused medicine after the expiration date. Inhalers need to be thrown away after the labeled number of puffs have been used or by the expiration date; whichever comes first. Ventolin HFA  should be thrown away 12 months after removing from foil pouch. Check the instructions that come with your medicine. NOTE: This sheet is a summary. It may not cover all possible information. If you have questions about this medicine, talk to your doctor, pharmacist, or health care provider.  2015, [employer] Standard. (2012-11-22 10:57:17)  Prednisone tablets What is this medicine? PREDNISONE (PRED ni sone) is a corticosteroid. It is commonly used to treat inflammation of the skin, joints, lungs, and other organs. Common conditions treated include asthma, allergies, and arthritis. It is also used for other conditions, such as blood disorders and diseases of the adrenal glands. This medicine may be used for other purposes; ask your health care provider or pharmacist if you have questions. COMMON BRAND NAME(S): Deltasone, Predone, Sterapred, Sterapred DS What should I tell my health care provider before I take this medicine? They need to know if you have any of these conditions: -Cushing's syndrome -diabetes -glaucoma -heart disease -high blood pressure -infection (especially a virus infection such as chickenpox, cold sores, or herpes) -kidney disease -liver disease -mental illness -myasthenia gravis -osteoporosis -seizures -stomach or intestine problems -thyroid disease -an unusual or allergic reaction to lactose, prednisone, other medicines, foods, dyes, or preservatives -pregnant or trying to get pregnant -breast-feeding How should I use this medicine? Take this medicine by mouth with a glass of water. Follow the directions on the prescription label. Take this medicine with food. If you are taking this medicine once a day, take it in the morning. Do not take more medicine than you are told to take. Do not suddenly stop taking your medicine because you may develop a severe reaction. Your doctor will tell you how much medicine to take. If your doctor wants you to stop the medicine,  the dose may be slowly lowered over time to avoid any side effects. Talk to your pediatrician regarding the use of this medicine in children. Special care may be needed. Overdosage: If you think you have taken too much of this medicine contact a poison control center or emergency room at once. NOTE: This medicine is only for you. Do not share this medicine with others. What if I miss a dose? If you miss a dose, take it as soon as you can. If it is almost time for your next dose, talk to your doctor or health care professional. You may need to miss a dose or take an extra dose. Do not take double or extra doses without advice. What may interact with this medicine? Do not take this medicine with any of the following medications: -metyrapone -mifepristone This medicine may also interact with the following medications: -aminoglutethimide -amphotericin B -aspirin and aspirin-like medicines -barbiturates -certain medicines for diabetes, like glipizide or glyburide -cholestyramine -cholinesterase inhibitors -cyclosporine -digoxin -diuretics -ephedrine -female hormones, like estrogens and birth control pills -isoniazid -ketoconazole -NSAIDS, medicines for pain and inflammation, like ibuprofen or naproxen -phenytoin -rifampin -toxoids -vaccines -warfarin This list may not describe all possible interactions. Give your health care provider a list of all the medicines, herbs, non-prescription drugs, or dietary supplements you use. Also tell them if you smoke, drink  alcohol, or use illegal drugs. Some items may interact with your medicine. What should I watch for while using this medicine? Visit your doctor or health care professional for regular checks on your progress. If you are taking this medicine over a prolonged period, carry an identification card with your name and address, the type and dose of your medicine, and your doctor's name and address. This medicine may increase your risk of  getting an infection. Tell your doctor or health care professional if you are around anyone with measles or chickenpox, or if you develop sores or blisters that do not heal properly. If you are going to have surgery, tell your doctor or health care professional that you have taken this medicine within the last twelve months. Ask your doctor or health care professional about your diet. You may need to lower the amount of salt you eat. This medicine may affect blood sugar levels. If you have diabetes, check with your doctor or health care professional before you change your diet or the dose of your diabetic medicine. What side effects may I notice from receiving this medicine? Side effects that you should report to your doctor or health care professional as soon as possible: -allergic reactions like skin rash, itching or hives, swelling of the face, lips, or tongue -changes in emotions or moods -changes in vision -depressed mood -eye pain -fever or chills, cough, sore throat, pain or difficulty passing urine -increased thirst -swelling of ankles, feet Side effects that usually do not require medical attention (report to your doctor or health care professional if they continue or are bothersome): -confusion, excitement, restlessness -headache -nausea, vomiting -skin problems, acne, thin and shiny skin -trouble sleeping -weight gain This list may not describe all possible side effects. Call your doctor for medical advice about side effects. You may report side effects to FDA at 1-800-FDA-1088. Where should I keep my medicine? Keep out of the reach of children. Store at room temperature between 15 and 30 degrees C (59 and 86 degrees F). Protect from light. Keep container tightly closed. Throw away any unused medicine after the expiration date. NOTE: This sheet is a summary. It may not cover all possible information. If you have questions about this medicine, talk to your doctor, pharmacist, or  health care provider.  2015, [employer] Standard. (2011-01-20 10:57:14)  Smoking Hazards Smoking cigarettes is extremely bad for your health. Tobacco smoke has over 200 known poisons in it. It contains the poisonous gases nitrogen oxide and carbon monoxide. There are over 60 chemicals in tobacco smoke that cause cancer. Some of the chemicals found in cigarette smoke include:   Cyanide.   Benzene.   Formaldehyde.   Methanol (wood alcohol).   Acetylene (fuel used in welding torches).   Ammonia.  Even smoking lightly shortens your life expectancy by several years. You can greatly reduce the risk of medical problems for you and your family by stopping now. Smoking is the most preventable cause of death and disease in our society. Within days of quitting smoking, your circulation improves, you decrease the risk of having a heart attack, and your lung capacity improves. There may be some increased phlegm in the first few days after quitting, and it may take months for your lungs to clear up completely. Quitting for 10 years reduces your risk of developing lung cancer to almost that of a nonsmoker.  WHAT ARE THE RISKS OF SMOKING? Cigarette smokers have an increased risk of many serious medical problems, including:  Lung cancer.   Lung disease (such as pneumonia, bronchitis, and emphysema).   Heart attack and chest pain due to the heart not getting enough oxygen (angina).   Heart disease and peripheral blood vessel disease.   Hypertension.   Stroke.   Oral cancer (cancer of the lip, mouth, or voice box).   Bladder cancer.   Pancreatic cancer.   Cervical cancer.   Pregnancy complications, including premature birth.   Stillbirths and smaller newborn babies, birth defects, and genetic damage to sperm.   Early menopause.   Lower estrogen level for women.   Infertility.   Facial wrinkles.   Blindness.   Increased risk of broken bones (fractures).    Senile dementia.   Stomach ulcers and internal bleeding.   Delayed wound healing and increased risk of complications during surgery. Because of secondhand smoke exposure, children of smokers have an increased risk of the following:   Sudden infant death syndrome (SIDS).   Respiratory infections.   Lung cancer.   Heart disease.   Ear infections.  WHY IS SMOKING ADDICTIVE? Nicotine is the chemical agent in tobacco that is capable of causing addiction or dependence. When you smoke and inhale, nicotine is absorbed rapidly into the bloodstream through your lungs. Both inhaled and noninhaled nicotine may be addictive.  WHAT ARE THE BENEFITS OF QUITTING?  There are many health benefits to quitting smoking. Some are:   The likelihood of developing cancer and heart disease decreases. Health improvements are seen almost immediately.   Blood pressure, pulse rate, and breathing patterns start returning to normal soon after quitting.   People who quit may see an improvement in their overall quality of life.  HOW DO YOU QUIT SMOKING? Smoking is an addiction with both physical and psychological effects, and longtime habits can be hard to change. Your health care provider can recommend:  Programs and community resources, which may include group support, education, or therapy.  Replacement products, such as patches, gum, and nasal sprays. Use these products only as directed. Do not replace cigarette smoking with electronic cigarettes (commonly called e-cigarettes). The safety of e-cigarettes is unknown, and some may contain harmful chemicals. FOR MORE INFORMATION  American Lung Association: www.lung.org  American Cancer Society: www.cancer.org Document Released: 07/14/2004 Document Revised: 03/27/2013 Document Reviewed: 11/26/2012 [facility] Patient Information 2015 [place], [place]. This information is not intended to replace advice given to you by your health care provider. Make  sure you discuss any questions you have with your health care provider.

## 2015-03-26 ENCOUNTER — Emergency Department (HOSPITAL_BASED_OUTPATIENT_CLINIC_OR_DEPARTMENT_OTHER)
Admission: EM | Admit: 2015-03-26 | Discharge: 2015-03-26 | Disposition: A | Discharge status: Home / Self Care | Payer: Commercial Managed Care - HMO | Attending: Emergency Medicine | Admitting: Emergency Medicine

## 2015-03-26 ENCOUNTER — Emergency Department (HOSPITAL_BASED_OUTPATIENT_CLINIC_OR_DEPARTMENT_OTHER): Payer: Commercial Managed Care - HMO

## 2015-03-26 ENCOUNTER — Encounter (HOSPITAL_BASED_OUTPATIENT_CLINIC_OR_DEPARTMENT_OTHER): Payer: Self-pay | Admitting: Emergency Medicine

## 2015-03-26 DIAGNOSIS — Q211 Atrial septal defect: Secondary | ICD-10-CM | POA: Insufficient documentation

## 2015-03-26 DIAGNOSIS — E785 Hyperlipidemia, unspecified: Secondary | ICD-10-CM | POA: Insufficient documentation

## 2015-03-26 DIAGNOSIS — G4700 Insomnia, unspecified: Secondary | ICD-10-CM

## 2015-03-26 DIAGNOSIS — F419 Anxiety disorder, unspecified: Secondary | ICD-10-CM

## 2015-03-26 DIAGNOSIS — Z8701 Personal history of pneumonia (recurrent): Secondary | ICD-10-CM | POA: Insufficient documentation

## 2015-03-26 DIAGNOSIS — Z7952 Long term (current) use of systemic steroids: Secondary | ICD-10-CM | POA: Diagnosis not present

## 2015-03-26 DIAGNOSIS — Z8781 Personal history of (healed) traumatic fracture: Secondary | ICD-10-CM | POA: Diagnosis not present

## 2015-03-26 DIAGNOSIS — Z872 Personal history of diseases of the skin and subcutaneous tissue: Secondary | ICD-10-CM | POA: Diagnosis not present

## 2015-03-26 DIAGNOSIS — R05 Cough: Secondary | ICD-10-CM | POA: Diagnosis not present

## 2015-03-26 DIAGNOSIS — E876 Hypokalemia: Secondary | ICD-10-CM

## 2015-03-26 DIAGNOSIS — Z9861 Coronary angioplasty status: Secondary | ICD-10-CM | POA: Diagnosis not present

## 2015-03-26 DIAGNOSIS — M1990 Unspecified osteoarthritis, unspecified site: Secondary | ICD-10-CM | POA: Insufficient documentation

## 2015-03-26 DIAGNOSIS — I252 Old myocardial infarction: Secondary | ICD-10-CM | POA: Insufficient documentation

## 2015-03-26 DIAGNOSIS — I10 Essential (primary) hypertension: Secondary | ICD-10-CM | POA: Diagnosis not present

## 2015-03-26 DIAGNOSIS — Z8673 Personal history of transient ischemic attack (TIA), and cerebral infarction without residual deficits: Secondary | ICD-10-CM | POA: Diagnosis not present

## 2015-03-26 DIAGNOSIS — Z720 Tobacco use: Secondary | ICD-10-CM | POA: Insufficient documentation

## 2015-03-26 DIAGNOSIS — R61 Generalized hyperhidrosis: Secondary | ICD-10-CM | POA: Insufficient documentation

## 2015-03-26 DIAGNOSIS — I2510 Atherosclerotic heart disease of native coronary artery without angina pectoris: Secondary | ICD-10-CM | POA: Insufficient documentation

## 2015-03-26 DIAGNOSIS — H5441 Blindness, right eye, normal vision left eye: Secondary | ICD-10-CM | POA: Diagnosis not present

## 2015-03-26 DIAGNOSIS — Z79899 Other long term (current) drug therapy: Secondary | ICD-10-CM | POA: Diagnosis not present

## 2015-03-26 LAB — CBC WITH DIFFERENTIAL/PLATELET
BASOS ABS: 0 10*3/uL (ref 0.0–0.1)
BASOS PCT: 0 %
EOS ABS: 0.3 10*3/uL (ref 0.0–0.7)
Eosinophils Relative: 2 %
HCT: 45.9 % (ref 36.0–46.0)
Hemoglobin: 15.2 g/dL — ABNORMAL HIGH (ref 12.0–15.0)
Lymphocytes Relative: 25 %
Lymphs Abs: 3.1 10*3/uL (ref 0.7–4.0)
MCH: 28.4 pg (ref 26.0–34.0)
MCHC: 33.1 g/dL (ref 30.0–36.0)
MCV: 85.8 fL (ref 78.0–100.0)
MONO ABS: 0.6 10*3/uL (ref 0.1–1.0)
Monocytes Relative: 5 %
Neutro Abs: 8.5 10*3/uL — ABNORMAL HIGH (ref 1.7–7.7)
Neutrophils Relative %: 68 %
PLATELETS: 237 10*3/uL (ref 150–400)
RBC: 5.35 MIL/uL — ABNORMAL HIGH (ref 3.87–5.11)
RDW: 14.3 % (ref 11.5–15.5)
WBC: 12.5 10*3/uL — ABNORMAL HIGH (ref 4.0–10.5)

## 2015-03-26 LAB — BASIC METABOLIC PANEL
ANION GAP: 9 (ref 5–15)
BUN: 15 mg/dL (ref 6–20)
CALCIUM: 9 mg/dL (ref 8.9–10.3)
CO2: 25 mmol/L (ref 22–32)
CREATININE: 0.56 mg/dL (ref 0.44–1.00)
Chloride: 108 mmol/L (ref 101–111)
GFR calc non Af Amer: >60 mL/min (ref >60)
Glucose, Bld: 191 mg/dL — ABNORMAL HIGH (ref 65–99)
Potassium: 3.1 mmol/L — ABNORMAL LOW (ref 3.5–5.1)
Sodium: 142 mmol/L (ref 135–145)

## 2015-03-26 LAB — TROPONIN I

## 2015-03-26 MED ORDER — LORAZEPAM 1 MG PO TABS: 1.0000 mg | ORAL_TABLET | Freq: Every evening | ORAL | Status: DC | PRN

## 2015-03-26 MED ORDER — LORAZEPAM 2 MG/ML IJ SOLN
1.0000 mg | Freq: Once | INTRAMUSCULAR | Status: AC
  Administered 2015-03-26: 1 mg via INTRAVENOUS
  Filled 2015-03-26: qty 1

## 2015-03-26 NOTE — ED Notes (Signed)
Pt reports having uncontrolled sweats anxiety and fatique onset about 1.5 weeks

## 2015-03-26 NOTE — Discharge Instructions (Signed)
You were seen today for anxiety and hypertension. He also reports insomnia. Your workup is reassuring. He will be given a very short course of anxiety medication to help you sleep. You need to follow-up with her primary physician.  Panic Attacks Panic attacks are sudden, short-livedsurges of severe anxiety, fear, or discomfort. They may occur for no reason when you are relaxed, when you are anxious, or when you are sleeping. Panic attacks may occur for a number of reasons:   Healthy people occasionally have panic attacks in extreme, life-threatening situations, such as war or natural disasters. Normal anxiety is a protective mechanism of the body that helps [place] react to danger (fight or flight response).  Panic attacks are often seen with anxiety disorders, such as panic disorder, social anxiety disorder, generalized anxiety disorder, and phobias. Anxiety disorders cause excessive or uncontrollable anxiety. They may interfere with your relationships or other life activities.  Panic attacks are sometimes seen with other mental illnesses, such as depression and posttraumatic stress disorder.  Certain medical conditions, prescription medicines, and drugs of abuse can cause panic attacks. SYMPTOMS  Panic attacks start suddenly, peak within 20 minutes, and are accompanied by four or more of the following symptoms:  Pounding heart or fast heart rate (palpitations).  Sweating.  Trembling or shaking.  Shortness of breath or feeling smothered.  Feeling choked.  Chest pain or discomfort.  Nausea or strange feeling in your stomach.  Dizziness, light-headedness, or feeling like you will faint.  Chills or hot flushes.  Numbness or tingling in your lips or hands and feet.  Feeling that things are not real or feeling that you are not yourself.  Fear of losing control or going crazy.  Fear of dying. Some of these symptoms can mimic serious medical conditions. For example, you may think you  are having a heart attack. Although panic attacks can be very scary, they are not life threatening. DIAGNOSIS  Panic attacks are diagnosed through an assessment by your health care provider. Your health care provider will ask questions about your symptoms, such as where and when they occurred. Your health care provider will also ask about your medical history and use of alcohol and drugs, including prescription medicines. Your health care provider may order blood tests or other studies to rule out a serious medical condition. Your health care provider may refer you to a mental health professional for further evaluation. TREATMENT   Most healthy people who have one or two panic attacks in an extreme, life-threatening situation will not require treatment.  The treatment for panic attacks associated with anxiety disorders or other mental illness typically involves counseling with a mental health professional, medicine, or a combination of both. Your health care provider will help determine what treatment is best for you.  Panic attacks due to physical illness usually go away with treatment of the illness. If prescription medicine is causing panic attacks, talk with your health care provider about stopping the medicine, decreasing the dose, or substituting another medicine.  Panic attacks due to alcohol or drug abuse go away with abstinence. Some adults need professional help in order to stop drinking or using drugs. HOME CARE INSTRUCTIONS   Take all medicines as directed by your health care provider.   Schedule and attend follow-up visits as directed by your health care provider. It is important to keep all your appointments. SEEK MEDICAL CARE IF:  You are not able to take your medicines as prescribed.  Your symptoms do not improve or  get worse. SEEK IMMEDIATE MEDICAL CARE IF:   You experience panic attack symptoms that are different than your usual symptoms.  You have serious thoughts about  hurting yourself or others.  You are taking medicine for panic attacks and have a serious side effect. MAKE SURE YOU:  Understand these instructions.  Will watch your condition.  Will get help right away if you are not doing well or get worse.   This information is not intended to replace advice given to you by your health care provider. Make sure you discuss any questions you have with your health care provider.   Document Released: 06/06/2005 Document Revised: 06/11/2013 Document Reviewed: 01/18/2013 Elsevier Interactive Patient Education 2016 [employer].   Hypertension Hypertension, commonly called high blood pressure, is when the force of blood pumping through your arteries is too strong. Your arteries are the blood vessels that carry blood from your heart throughout your body. A blood pressure reading consists of a higher number over a lower number, such as 110/72. The higher number (systolic) is the pressure inside your arteries when your heart pumps. The lower number (diastolic) is the pressure inside your arteries when your heart relaxes. Ideally you want your blood pressure below 120/80. Hypertension forces your heart to work harder to pump blood. Your arteries may become narrow or stiff. Having untreated or uncontrolled hypertension can cause heart attack, stroke, kidney disease, and other problems. RISK FACTORS Some risk factors for high blood pressure are controllable. Others are not.  Risk factors you cannot control include:   Race. You may be at higher risk if you are African American.  Age. Risk increases with age.  Gender. Men are at higher risk than women before age 39 years. After age 20, women are at higher risk than men. Risk factors you can control include:  Not getting enough exercise or physical activity.  Being overweight.  Getting too much fat, sugar, calories, or salt in your diet.  Drinking too much alcohol. SIGNS AND SYMPTOMS Hypertension does not  usually cause signs or symptoms. Extremely high blood pressure (hypertensive crisis) may cause headache, anxiety, shortness of breath, and nosebleed. DIAGNOSIS To check if you have hypertension, your health care provider will measure your blood pressure while you are seated, with your arm held at the level of your heart. It should be measured at least twice using the same arm. Certain conditions can cause a difference in blood pressure between your right and left arms. A blood pressure reading that is higher than normal on one occasion does not mean that you need treatment. If it is not clear whether you have high blood pressure, you may be asked to return on a different day to have your blood pressure checked again. Or, you may be asked to monitor your blood pressure at home for 1 or more weeks. TREATMENT Treating high blood pressure includes making lifestyle changes and possibly taking medicine. Living a healthy lifestyle can help lower high blood pressure. You may need to change some of your habits. Lifestyle changes may include:  Following the DASH diet. This diet is high in fruits, vegetables, and whole grains. It is low in salt, red meat, and added sugars.  Keep your sodium intake below 2,300 mg per day.  Getting at least 30-45 minutes of aerobic exercise at least 4 times per week.  Losing weight if necessary.  Not smoking.  Limiting alcoholic beverages.  Learning ways to reduce stress. Your health care provider may prescribe medicine if  lifestyle changes are not enough to get your blood pressure under control, and if one of the following is true:  You are 82-5 years of age and your systolic blood pressure is above 140.  You are 76 years of age or older, and your systolic blood pressure is above 150.  Your diastolic blood pressure is above 90.  You have diabetes, and your systolic blood pressure is over 378 or your diastolic blood pressure is over 90.  You have kidney disease and  your blood pressure is above 140/90.  You have heart disease and your blood pressure is above 140/90. Your personal target blood pressure may vary depending on your medical conditions, your age, and other factors. HOME CARE INSTRUCTIONS  Have your blood pressure rechecked as directed by your health care provider.   Take medicines only as directed by your health care provider. Follow the directions carefully. Blood pressure medicines must be taken as prescribed. The medicine does not work as well when you skip doses. Skipping doses also puts you at risk for problems.  Do not smoke.   Monitor your blood pressure at home as directed by your health care provider. SEEK MEDICAL CARE IF:   You think you are having a reaction to medicines taken.  You have recurrent headaches or feel dizzy.  You have swelling in your ankles.  You have trouble with your vision. SEEK IMMEDIATE MEDICAL CARE IF:  You develop a severe headache or confusion.  You have unusual weakness, numbness, or feel faint.  You have severe chest or abdominal pain.  You vomit repeatedly.  You have trouble breathing. MAKE SURE YOU:   Understand these instructions.  Will watch your condition.  Will get help right away if you are not doing well or get worse.   This information is not intended to replace advice given to you by your health care provider. Make sure you discuss any questions you have with your health care provider.   Document Released: 06/06/2005 Document Revised: 10/21/2014 Document Reviewed: 03/29/2013 Elsevier Interactive Patient Education [employer].

## 2015-03-26 NOTE — ED Provider Notes (Signed)
CSN: [id number]     Arrival date & time 03/26/15  1937 History   First MD Initiated Contact with Patient 03/26/15 0350     Chief Complaint  Patient presents with  . Hypertension     (Consider location/radiation/quality/duration/timing/severity/associated sxs/prior Treatment) HPI  This a 54 year old female who presents with multiple complaints including insomnia, sweats, anxiety, fatigue, and hypertension. Upon initial evaluation, patient states that "I'm very anxious." "I think something is wrong with me."  She states "I think I may be having a heart attack." Patient reports daily sweats worsening over the last 10 days. She also reports insomnia and increased anxiety. Had previously been on Klonopin but has not been on Klonopin for the last 2 months. She denies any chest pain or shortness of breath. Patient's husband states that he woke her up saying she needs to the hospital. She denies any recent fevers. Does report recent history of bronchitis for which she was treated with albuterol and steroids.  She is not currently on steroids.  Reports improvement in cough. Denies any nausea, vomiting, abdominal pain, dysuria. No history of thyroid problems. Patient also states "my blood pressure is high." Reports that she takes her blood pressures daily in the morning. She has not taken her blood pressure today. Denies headache, vision changes, weakness, numbness, or tingling.  Patient is unsure of whether she could be going through menopause.  Past Medical History  Diagnosis Date  . PFO (patent foramen ovale) August 2012    PFO seen on TEE during hospitalization in 01/2011.  Patient to f/u with Dr. Leonie Man, neurology, for enrollment in trial for medical treatment of PFO  . CAD (coronary artery disease) 2007    istory of MI with stent in 2007 by Dr. Chancy Milroy, [facility]. Stent placement by Dr Cleatis Polka   . Prediabetes 03/07/2011  . Hypertension 03/07/2011  . Hyperlipidemia 03/07/2011  . Tobacco abuse  03/07/2011  . Depression 03/07/2011  . Psoriasis 03/07/2011  . Acute right MCA stroke ([place]) 01/27/11    Acute R MCA stroked  with L arm and leg weakness  . Anxiety 1980s    On Klonipin since age 59. Had addiction problem with Xanax which  Was therefore d/c . Se[facility] in the past at [facility].   . Fractured toe 01/2011    History of left fifth toe proximal phalanx fracture when patient had a stroke (01/2011) and fell. Seen by Dr Doran Durand.   . Incontinence 04/14/2011  . Arthritis 04/16/2011  . Tobacco abuse 03/07/2011    Quit 01/2011   . Fracture of fifth toe, left, closed 01/28/2011    History of left fifth toe proximal phalanx fracture when patient had a stroke (01/2011) and fell. Seen by Dr Doran Durand.    . Substance abuse     h/o narcotic abuse pt denies as of 01/09/12  . Myocardial infarct, old 2008    stents  . Blind right eye   . Pneumonia    Past Surgical History  Procedure Laterality Date  . Cardiac stent      2008  . Eye surgery     Family History  Problem Relation Age of Onset  . Heart disease Mother   . Diabetes Mother   . Heart disease Father   . Diabetes Father   . Heart disease Brother   . Diabetes Brother   . Heart disease Maternal Grandmother   . Heart disease Maternal Grandfather   . Heart disease Paternal Grandmother   . Heart disease Paternal  Grandfather   . Lung cancer Brother   . Heart disease Brother   . Cancer Sister 60    ovarian   Social History  Substance Use Topics  . Smoking status: Current Some Day Smoker -- 0.50 packs/day for 30 years    Types: Cigarettes    Last Attempt to Quit: 04/07/2014  . Smokeless tobacco: Never Used     Comment: Trying to quit again.  . Alcohol Use: No   OB History    No data available     Review of Systems  Constitutional: Positive for diaphoresis and fatigue. Negative for fever and appetite change.       Insomnia  Respiratory: Negative for cough, chest tightness and shortness of breath.   Cardiovascular: Negative  for chest pain.  Gastrointestinal: Negative for nausea, vomiting and abdominal pain.  Genitourinary: Negative for dysuria.  Musculoskeletal: Negative for back pain.  Skin: Negative for wound.  Neurological: Negative for headaches.  Psychiatric/Behavioral: Negative for suicidal ideas, confusion and self-injury. The patient is nervous/anxious.   All other systems reviewed and are negative.     Allergies  Azor and Cozaar  Home Medications   Prior to Admission medications   Medication Sig Start Date End Date Taking? Authorizing Provider  albuterol (PROVENTIL HFA;VENTOLIN HFA) 108 (90 BASE) MCG/ACT inhaler Inhale 2 puffs into the lungs every 4 (four) hours as needed for wheezing or shortness of breath (or coughing). [birth year]   Delora Fuel, MD  allopurinol (ZYLOPRIM) 300 MG tablet TAKE 1 TABLET BY MOUTH EVERY DAY 11/07/14   Sid Falcon, MD  amLODipine (NORVASC) 5 MG tablet TAKE 2 TABLETS BY MOUTH EVERY DAY 11/07/14   Sid Falcon, MD  atorvastatin (LIPITOR) 10 MG tablet TAKE 1 TABLET BY MOUTH EVERY DAY AT [facility] 11/07/14   Sid Falcon, MD  BENICAR 40 MG tablet TAKE 1 TABLET BY MOUTH DAILY 02/19/15   Axel Filler, MD  Carboxymethylcellulose Sodium 1 % GEL Apply 1 drop to eye. 03/14/14   Historical Provider, MD  clopidogrel (PLAVIX) 75 MG tablet TAKE 1 TABLET BY MOUTH EVERY DAY 11/07/14   Sid Falcon, MD  eszopiclone (LUNESTA) 2 MG TABS tablet Take 1 tablet (2 mg total) by mouth at bedtime as needed. for sleep 02/17/15   Rosalita Chessman, DO  LIPOFEN 50 MG CAPS TAKE 1 CAPSULE BY MOUTH EVERY DAY 08/15/14   Sid Falcon, MD  LORazepam (ATIVAN) 1 MG tablet Take 1 tablet (1 mg total) by mouth at bedtime as needed for anxiety or sleep. 03/26/15   Merryl Hacker, MD  metoprolol (LOPRESSOR) 100 MG tablet Take 1 tablet (100 mg total) by mouth 2 (two) times daily. 02/17/15   Rosalita Chessman, DO  potassium chloride (MICRO-K) 10 MEQ CR capsule Take 2 capsules (20 mEq total) by mouth daily. 09/15/14    Oval Linsey, MD  prednisoLONE acetate (PRED FORTE) 1 % ophthalmic suspension 1 drop 4 (four) times daily.    Historical Provider, MD  predniSONE (DELTASONE) 50 MG tablet Take 1 tablet (50 mg total) by mouth daily. 06/28/30   Delora Fuel, MD  tiZANidine (ZANAFLEX) 4 MG tablet TAKE 1/2 TO 1 TABLET BY MOUTH EVERY 8 HOURS AS NEEDED FOR MUSCLE SPASMS 11/28/14   Garvin Fila, MD   BP 167/86 mmHg  Pulse 75  Temp(Src) 97.9 F (36.6 C) (Oral)  Resp 24  Ht 5' 5.5" (1.664 m)  Wt 200 lb (90.719 kg)  BMI 32.76 kg/m2  SpO2 94%  Physical Exam  Constitutional: She is oriented to person, place, and time.  Anxious, overweight  HENT:  Head: Normocephalic and atraumatic.  Mouth/Throat: Oropharynx is clear and moist.  Cardiovascular: Normal rate, regular rhythm and normal heart sounds.   No murmur heard. Pulmonary/Chest: Effort normal and breath sounds normal. No respiratory distress. She has no wheezes.  Abdominal: Soft. Bowel sounds are normal. There is no tenderness. There is no rebound.  Musculoskeletal: She exhibits no edema.  Neurological: She is alert and oriented to person, place, and time.  Skin: Skin is warm and dry.  Mildly diaphoretic  Psychiatric:  Very anxious  Nursing note and vitals reviewed.   ED Course  Procedures (including critical care time) Labs Review Labs Reviewed  CBC WITH DIFFERENTIAL/PLATELET - Abnormal; Notable for the following:    WBC 12.5 (*)    RBC 5.35 (*)    Hemoglobin 15.2 (*)    Neutro Abs 8.5 (*)    All other components within normal limits  BASIC METABOLIC PANEL - Abnormal; Notable for the following:    Potassium 3.1 (*)    Glucose, Bld 191 (*)    All other components within normal limits  TROPONIN I    Imaging Review Dg Chest 2 View  03/26/2015   CLINICAL DATA:  Cough.  Diaphoresis.  EXAM: CHEST  2 VIEW  COMPARISON:  03/17/2015  FINDINGS: The heart size and mediastinal contours are within normal limits. Both lungs are clear. The visualized  skeletal structures are unremarkable.  IMPRESSION: No active cardiopulmonary disease.   Electronically Signed   By: Andreas Newport M.D.   On: 03/26/2015 04:14   I have personally reviewed and evaluated these images and lab results as part of my medical decision-making.   EKG Interpretation   Date/Time:  Thursday March 26 2015 04:16:17 EDT Ventricular Rate:  83 PR Interval:  154 QRS Duration: 84 QT Interval:  400 QTC Calculation: 470 R Axis:   18 Text Interpretation:  Normal sinus rhythm Possible Left atrial enlargement  Borderline ECG Confirmed by Jonica Bickhart  MD, Amalia Edgecombe ([id number]) on 03/26/2015  4:28:21 AM      MDM   Final diagnoses:  Anxiety  Essential hypertension  Insomnia  Hypokalemia    Patient presents with multiple complaints. She is very anxious appearing on exam and endorses the same. Also endorses insomnia. Noted to be hypertensive with a blood pressure of 200/106 on admission. Otherwise nontoxic. Nonfocal. Patient has a history of ACS but denies chest pain or shortness of breath. She just continues to endorse that "something is wrong." Screening EKG, chest x-ray, and basic labwork obtained. Patient given IV Ativan. Lab work is largely reassuring. Patient does have mild hypokalemia. No evidence of acute ischemia.  On recheck, patient reports improvement of symptoms. I discussed with patient that she needs to follow-up closely with her primary physician. She likely needs thyroid testing. Her symptoms may also be hormonal. She is requesting Lunesta to sleep. I have referred her back to her primary physician. I will provide her with a few doses of Ativan for sleep and anxiety. Patient was given strict return precautions.  After history, exam, and medical workup I feel the patient has been appropriately medically screened and is safe for discharge home. Pertinent diagnoses were discussed with the patient. Patient was given return precautions.   Merryl Hacker, MD 03/26/15  [phone]

## 2015-03-27 ENCOUNTER — Ambulatory Visit (INDEPENDENT_AMBULATORY_CARE_PROVIDER_SITE_OTHER): Payer: Commercial Managed Care - HMO | Admitting: Family Medicine

## 2015-03-27 ENCOUNTER — Encounter: Payer: Self-pay | Admitting: Family Medicine

## 2015-03-27 VITALS — BP 160/82 | HR 74 | Temp 98.2°F | Ht 65.5 in | Wt 201.4 lb

## 2015-03-27 DIAGNOSIS — Z23 Encounter for immunization: Secondary | ICD-10-CM

## 2015-03-27 DIAGNOSIS — R232 Flushing: Secondary | ICD-10-CM

## 2015-03-27 DIAGNOSIS — N951 Menopausal and female climacteric states: Secondary | ICD-10-CM | POA: Diagnosis not present

## 2015-03-27 DIAGNOSIS — F410 Panic disorder [episodic paroxysmal anxiety] without agoraphobia: Secondary | ICD-10-CM

## 2015-03-27 DIAGNOSIS — Z1159 Encounter for screening for other viral diseases: Secondary | ICD-10-CM

## 2015-03-27 DIAGNOSIS — F411 Generalized anxiety disorder: Secondary | ICD-10-CM | POA: Diagnosis not present

## 2015-03-27 DIAGNOSIS — F419 Anxiety disorder, unspecified: Secondary | ICD-10-CM

## 2015-03-27 DIAGNOSIS — G4700 Insomnia, unspecified: Secondary | ICD-10-CM

## 2015-03-27 DIAGNOSIS — Z0000 Encounter for general adult medical examination without abnormal findings: Secondary | ICD-10-CM | POA: Diagnosis not present

## 2015-03-27 LAB — THYROID PANEL WITH TSH
FREE THYROXINE INDEX: 2 (ref 1.4–3.8)
T3 Uptake: 28 % (ref 22–35)
T4, Total: 7 ug/dL (ref 4.5–12.0)
TSH: 0.813 u[IU]/mL (ref 0.350–4.500)

## 2015-03-27 LAB — COMPREHENSIVE METABOLIC PANEL
ALBUMIN: 3.9 g/dL (ref 3.5–5.2)
ALK PHOS: 103 U/L (ref 39–117)
ALT: 12 U/L (ref 0–35)
AST: 9 U/L (ref 0–37)
BILIRUBIN TOTAL: 0.4 mg/dL (ref 0.2–1.2)
BUN: 12 mg/dL (ref 6–23)
CALCIUM: 9.4 mg/dL (ref 8.4–10.5)
CO2: 29 meq/L (ref 19–32)
CREATININE: 0.56 mg/dL (ref 0.40–1.20)
Chloride: 107 mEq/L (ref 96–112)
GFR: 120.05 mL/min (ref >60.00)
Glucose, Bld: 138 mg/dL — ABNORMAL HIGH (ref 70–99)
Potassium: 4.2 mEq/L (ref 3.5–5.1)
Sodium: 142 mEq/L (ref 135–145)
TOTAL PROTEIN: 6.7 g/dL (ref 6.0–8.3)

## 2015-03-27 LAB — FOLLICLE STIMULATING HORMONE: FSH: 45.1 m[IU]/mL

## 2015-03-27 LAB — LUTEINIZING HORMONE: LH: 34.77 m[IU]/mL

## 2015-03-27 MED ORDER — LORAZEPAM 1 MG PO TABS: 1.0000 mg | ORAL_TABLET | Freq: Every evening | ORAL | Status: DC | PRN

## 2015-03-27 NOTE — Progress Notes (Signed)
Pre visit review using our clinic review tool, if applicable. No additional management support is needed unless otherwise documented below in the visit note. 

## 2015-03-27 NOTE — Patient Instructions (Signed)

## 2015-03-27 NOTE — Telephone Encounter (Signed)
error 
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
## 2015-03-27 NOTE — Assessment & Plan Note (Signed)
Gave pt names and numbers for new psych and encouraged her to call

## 2015-03-28 LAB — HEPATITIS C ANTIBODY: HCV Ab: NEGATIVE

## 2015-03-30 ENCOUNTER — Telehealth: Payer: Self-pay | Admitting: *Deleted

## 2015-03-30 NOTE — Telephone Encounter (Signed)
Received call from [place] with [facility] to say a Estradiol Free was ordered on this pt but the wrong tube was sent, and they want to know if they could run a plain Estradiol level.  Verbally informed Dr. Etter Sjogren of the message and she stated that it would be okay to run the plain Estradiol.  Informed Sirena that it will be okay to run the plain Estradiol level per Dr. Minette Brine

## 2015-03-31 LAB — ESTRADIOL: Estradiol: <11.8 pg/mL

## 2015-04-01 ENCOUNTER — Ambulatory Visit: Payer: Medicare HMO | Admitting: Neurology

## 2015-04-02 ENCOUNTER — Telehealth: Payer: Self-pay | Admitting: Family Medicine

## 2015-04-02 ENCOUNTER — Encounter: Payer: Self-pay | Admitting: Neurology

## 2015-04-02 ENCOUNTER — Ambulatory Visit (INDEPENDENT_AMBULATORY_CARE_PROVIDER_SITE_OTHER): Payer: Commercial Managed Care - HMO | Admitting: Neurology

## 2015-04-02 VITALS — BP 166/93 | HR 84 | Ht 65.0 in | Wt 201.8 lb

## 2015-04-02 DIAGNOSIS — G8110 Spastic hemiplegia affecting unspecified side: Secondary | ICD-10-CM | POA: Diagnosis not present

## 2015-04-02 MED ORDER — TIZANIDINE HCL 4 MG PO TABS: 8.0000 mg | ORAL_TABLET | Freq: Two times a day (BID) | ORAL | Status: DC

## 2015-04-02 NOTE — Patient Instructions (Signed)
I had a long discussion with the patient and husband regarding her remote right MCA stroke and residual spastic left hemiplegia and answered questions. I recommend we increase the Zanaflex to 8 mg twice daily to help with spasticity and cramps. She was advised to stay on Plavix for secondary stroke prevention and maintain strict control of hypertension with blood pressure goal below 130/90 and lipids with LDL cholesterol goal below 70 mg percent. I also strongly encouraged her to quit smoking. She was given a one-time prescription refill  For xanaflex.Since it's been more than 3 years since her stroke I do not recommend routine follow-up appointment with me. She may be referred back by the primary physician in the future as necessary.

## 2015-04-02 NOTE — Telephone Encounter (Signed)
Notes Recorded by Rosalita Chessman, DO on 03/31/2015 at 10:09 AM Post menopausal   Spoke with patient and she stated she has been having hot flashes and sweats and wants to know what she should do. Please advise     KP

## 2015-04-02 NOTE — Telephone Encounter (Signed)
Relation to TG:YBWL Call back number:[phone]    Reason for call:  Patient inquiring about lab results

## 2015-04-02 NOTE — Progress Notes (Signed)
PATIENT: Sandra [name] DOB: 1961/01/19  REASON FOR VISIT: routine follow up for stroke HISTORY FROM: patient  HISTORY OF PRESENT ILLNESS: PRIOR HPI : 53year lady with right middle cerebral artery barnch infarct in August 2012 from right MCA occlusion with vascular risk factors of Diabetes, Hypertension, Hyperlipidimia, Smoking,CAD, Patent Foramen Ovale and mild Obesity.   Returns for followup after last visit on 07/21/11. She reports she is having an increase in dragging of her left leg notices this at different times. Denies any falls. Continues to have mild stiffness in left leg when she walks. She does not cook or lift heavy objects. She did not have a bubble study with doppler, Medicaid declined she states "because I had a heart attack and a stent". No new neurologic complaints. Reports her cholesterol and depression are stable. She has had a 7 pound weight loss since her last visit. She has completed physical and occupational therapy.   UPDATE 02/26/13 (LL): Sandra [name] returns for stroke follow up. She reports that she has done well, needs medication refills. She reports that she had office visit with Dr. Einar Gip recently, total cholesterol was 140 and Triglycerides were 145. Her fenofibrate was increased. She reports weakness in left arm and leg with spasticity. She states that Baclofen relieves her pain from spasticity. She states that she is smoking 1 pack of cigarettes a day again. She states she tried to quit but went back to it. She states she still has patches and may try to quit again; she knows she should.   UPDATE 02/26/14 (LL): Since last visit, she had extended hospitalization for MRSA pneumonia in May; was at [facility] and then transferred to [facility].  In the hospital over 3 weeks, on ventilator, became septic and lost vision in right eye.She has great recovery, but continues to smoke. Last lipid panel in Feb 2015 shows total cholesterol 147 and LDL 65. Blood pressure is elevated in the  office today at 155/91, but she states is usually well controlled. Last carotid doppler study was 1 year ago at Dr. Irven Shelling office, reportedly no significant stenosis. She is tolerating Plavix well with no signs of significant bleeding or bruising.  Update 04/02/15 : She returns for follow-up after last visit a year ago. She has not had any recurrent stroke or TIA symptoms now for 4 years. She states her blood pressure is difficult to control and it is elevated at 166/93 in office today. She had recent lab work done last week which was apparently fine but I don't have the results. She was seen in urgent care a few weeks ago for bronchitis and low potassium. She continues to smoke one pack per day. She needs help with activities like bathing and cooking and cleaning and work at home. She takes Zanaflex one or 2 tablets mostly at night to help her sleep as it helps her spasms.  REVIEW OF SYSTEMS: Full 14 system review of systems performed and notable only for: Excessive sweating, cough, wheezing, excessive thirst, muscle cramps, snoring, depression, nervousness and anxiety and all other systems negative  ALLERGIES: Allergies  Allergen Reactions  . Azor [Amlodipine-Olmesartan] Other (See Comments)    Drops blood pressure too low (takes plain amlodipine at home July 2015)  . Cozaar [Losartan Potassium]     HOME MEDICATIONS: Outpatient Prescriptions Prior to Visit  Medication Sig Dispense Refill  . albuterol (PROVENTIL HFA;VENTOLIN HFA) 108 (90 BASE) MCG/ACT inhaler Inhale 2 puffs into the lungs every 4 (four) hours as needed  for wheezing or shortness of breath (or coughing). 1 Inhaler 0  . allopurinol (ZYLOPRIM) 300 MG tablet TAKE 1 TABLET BY MOUTH EVERY DAY 30 tablet 6  . amLODipine (NORVASC) 5 MG tablet TAKE 2 TABLETS BY MOUTH EVERY DAY 60 tablet 6  . atorvastatin (LIPITOR) 10 MG tablet TAKE 1 TABLET BY MOUTH EVERY DAY AT 6PM 30 tablet 6  . atropine 1 % ophthalmic solution     . BENICAR 40 MG  tablet TAKE 1 TABLET BY MOUTH DAILY 30 tablet 2  . clopidogrel (PLAVIX) 75 MG tablet TAKE 1 TABLET BY MOUTH EVERY DAY 30 tablet 6  . LORazepam (ATIVAN) 1 MG tablet Take 1 tablet (1 mg total) by mouth at bedtime as needed for anxiety or sleep. 30 tablet 0  . metoprolol (LOPRESSOR) 100 MG tablet Take 1 tablet (100 mg total) by mouth 2 (two) times daily. 60 tablet 5  . potassium chloride (MICRO-K) 10 MEQ CR capsule Take 2 capsules (20 mEq total) by mouth daily. 60 capsule 11  . prednisoLONE acetate (PRED FORTE) 1 % ophthalmic suspension 1 drop 4 (four) times daily.    Marland Kitchen tiZANidine (ZANAFLEX) 4 MG tablet TAKE 1/2 TO 1 TABLET BY MOUTH EVERY 8 HOURS AS NEEDED FOR MUSCLE SPASMS 270 tablet 0   No facility-administered medications prior to visit.    PHYSICAL EXAM Filed Vitals:   04/02/15 1633  BP: 166/93  Pulse: 84  Height: 5' 5"  (1.651 m)  Weight: 201 lb 12.8 oz (91.536 kg)   Body mass index is 33.58 kg/(m^2). No exam data present No flowsheet data found.  No flowsheet data found.  Physical Exam  General: Pleasant middle aged Caucasian lady, in no distress. Afebrile.  Head: nontraumatic  Ears, Nose and Throat: Hearing is normal.  Neck: supple without bruit  Respiratory: clear to auscultation  Cardiovascular: no murmur or gallop  Musculoskeletal: no deformity  Skin: no rash  Generalized: In no acute distress, pleasant Caucasian female  Neck: Supple, no carotid bruits  Cardiac: Regular rate rhythm, no murmur  Pulmonary: Clear to auscultation bilaterally  Musculoskeletal: No deformity  Neurologic Exam  Mental Status: Awake, alert and oriented to time, place and person. Speech and language appear normal.  Cranial Nerves: Eye movements are full range without nystagmus. She is blind in the right eye from childhood.enopthalmos right eye. Face is asymmetric with mild left lower face weakness. Tongue is midline. Hearing is normal.  Motor: reveals no upper or lower extremity drift. Symmetric  and equal strength in all four extremities except mild left grip. Diminished fine finger movements on left and orbits right over left upper extremity. Increased tone in the left knee extensors with mild spasticity.  Sensory: Touch and pinprick sensations are normal.  Coordination: normal  Gait and Station: steady gait with mild left foot drop and stiff and dragging left leg. Unable to do tandem walking without support. Toe walking with mild foot drop  Reflexes: Deep tendon reflexes are 2+ asymmetrc brsiker on left. Plantars are downgoing.   ASSESSMENT: 54 year old female with right middle cerebral artery branch infarct in August 2012 from right MCA occlusion with vascular risk factors of Diabetes, Hypertension, Hyperlipidimia, Current Smoker,CAD, Patent Foramen Ovale, and mild Obesity.   Plan :  I had a long discussion with the patient and husband regarding her remote right MCA stroke and residual spastic left hemiplegia and answered questions. I recommend we increase the Zanaflex to 8 mg twice daily to help with spasticity and cramps. She  was advised to stay on Plavix for secondary stroke prevention and maintain strict control of hypertension with blood pressure goal below 130/90 and lipids with LDL cholesterol goal below 70 mg percent. I also strongly encouraged her to quit smoking. She was given a one-time prescription refill  For xanaflex.Since it's been more than 3 years since her stroke I do not recommend routine follow-up appointment with me. She may be referred back by the primary physician in the future as necessary.   Meds ordered this encounter  Medications  . eszopiclone (LUNESTA) 2 MG TABS tablet    Sig:   . tiZANidine (ZANAFLEX) 4 MG tablet    Sig: Take 2 tablets (8 mg total) by mouth 2 (two) times daily.    Dispense:  270 tablet    Refill:  1   Return if symptoms worsen or fail to improve.  Antony Contras, MD  04/02/2015, 5:54 PM [facility] [address]  [place], [place] [address] [phone]  Note: This document was prepared with digital dictation and possible smart phrase technology. Any transcriptional errors that result from this process are unintentional.

## 2015-04-03 NOTE — Telephone Encounter (Signed)
She can try black cohosh and estrovan over the counter but she is really not a candidate for hrt because of smoking

## 2015-04-03 NOTE — Telephone Encounter (Signed)
Patient aware and will try the [facility].     KP

## 2015-04-04 ENCOUNTER — Emergency Department (HOSPITAL_BASED_OUTPATIENT_CLINIC_OR_DEPARTMENT_OTHER)
Admission: EM | Admit: 2015-04-04 | Discharge: 2015-04-04 | Disposition: A | Discharge status: Home / Self Care | Payer: Commercial Managed Care - HMO | Attending: Emergency Medicine | Admitting: Emergency Medicine

## 2015-04-04 ENCOUNTER — Emergency Department (HOSPITAL_BASED_OUTPATIENT_CLINIC_OR_DEPARTMENT_OTHER): Payer: Commercial Managed Care - HMO

## 2015-04-04 ENCOUNTER — Encounter (HOSPITAL_BASED_OUTPATIENT_CLINIC_OR_DEPARTMENT_OTHER): Payer: Self-pay | Admitting: *Deleted

## 2015-04-04 DIAGNOSIS — Q211 Atrial septal defect: Secondary | ICD-10-CM | POA: Diagnosis not present

## 2015-04-04 DIAGNOSIS — Z8781 Personal history of (healed) traumatic fracture: Secondary | ICD-10-CM | POA: Insufficient documentation

## 2015-04-04 DIAGNOSIS — Z872 Personal history of diseases of the skin and subcutaneous tissue: Secondary | ICD-10-CM | POA: Diagnosis not present

## 2015-04-04 DIAGNOSIS — Z7952 Long term (current) use of systemic steroids: Secondary | ICD-10-CM | POA: Insufficient documentation

## 2015-04-04 DIAGNOSIS — I252 Old myocardial infarction: Secondary | ICD-10-CM | POA: Insufficient documentation

## 2015-04-04 DIAGNOSIS — Z7902 Long term (current) use of antithrombotics/antiplatelets: Secondary | ICD-10-CM | POA: Diagnosis not present

## 2015-04-04 DIAGNOSIS — I10 Essential (primary) hypertension: Secondary | ICD-10-CM | POA: Insufficient documentation

## 2015-04-04 DIAGNOSIS — Z720 Tobacco use: Secondary | ICD-10-CM | POA: Diagnosis not present

## 2015-04-04 DIAGNOSIS — F17210 Nicotine dependence, cigarettes, uncomplicated: Secondary | ICD-10-CM | POA: Diagnosis not present

## 2015-04-04 DIAGNOSIS — H5441 Blindness, right eye, normal vision left eye: Secondary | ICD-10-CM | POA: Insufficient documentation

## 2015-04-04 DIAGNOSIS — Z8739 Personal history of other diseases of the musculoskeletal system and connective tissue: Secondary | ICD-10-CM | POA: Insufficient documentation

## 2015-04-04 DIAGNOSIS — F419 Anxiety disorder, unspecified: Secondary | ICD-10-CM | POA: Insufficient documentation

## 2015-04-04 DIAGNOSIS — E785 Hyperlipidemia, unspecified: Secondary | ICD-10-CM | POA: Diagnosis not present

## 2015-04-04 DIAGNOSIS — Z9861 Coronary angioplasty status: Secondary | ICD-10-CM | POA: Insufficient documentation

## 2015-04-04 DIAGNOSIS — I2510 Atherosclerotic heart disease of native coronary artery without angina pectoris: Secondary | ICD-10-CM | POA: Diagnosis not present

## 2015-04-04 DIAGNOSIS — Z8701 Personal history of pneumonia (recurrent): Secondary | ICD-10-CM | POA: Diagnosis not present

## 2015-04-04 DIAGNOSIS — Z8673 Personal history of transient ischemic attack (TIA), and cerebral infarction without residual deficits: Secondary | ICD-10-CM | POA: Diagnosis not present

## 2015-04-04 DIAGNOSIS — Z79899 Other long term (current) drug therapy: Secondary | ICD-10-CM | POA: Insufficient documentation

## 2015-04-04 DIAGNOSIS — R05 Cough: Secondary | ICD-10-CM | POA: Diagnosis not present

## 2015-04-04 DIAGNOSIS — J40 Bronchitis, not specified as acute or chronic: Secondary | ICD-10-CM

## 2015-04-04 MED ORDER — HYDROCODONE-HOMATROPINE 5-1.5 MG/5ML PO SYRP: 5.0000 mL | ORAL_SOLUTION | Freq: Four times a day (QID) | ORAL | Status: DC | PRN

## 2015-04-04 NOTE — Discharge Instructions (Signed)
Upper Respiratory Infection, Adult Most upper respiratory infections (URIs) are a viral infection of the air passages leading to the lungs. A URI affects the nose, throat, and upper air passages. The most common type of URI is nasopharyngitis and is typically referred to as "the common cold." URIs run their course and usually go away on their own. Most of the time, a URI does not require medical attention, but sometimes a bacterial infection in the upper airways can follow a viral infection. This is called a secondary infection. Sinus and middle ear infections are common types of secondary upper respiratory infections. Bacterial pneumonia can also complicate a URI. A URI can worsen asthma and chronic obstructive pulmonary disease (COPD). Sometimes, these complications can require emergency medical care and may be life threatening.  CAUSES Almost all URIs are caused by viruses. A virus is a type of germ and can spread from one person to another.  RISKS FACTORS You may be at risk for a URI if:   You smoke.   You have chronic heart or lung disease.  You have a weakened defense (immune) system.   You are very young or very old.   You have nasal allergies or asthma.  You work in crowded or poorly ventilated areas.  You work in health care facilities or schools. SIGNS AND SYMPTOMS  Symptoms typically develop 2-3 days after you come in contact with a cold virus. Most viral URIs last 7-10 days. However, viral URIs from the influenza virus (flu virus) can last 14-18 days and are typically more severe. Symptoms may include:   Runny or stuffy (congested) nose.   Sneezing.   Cough.   Sore throat.   Headache.   Fatigue.   Fever.   Loss of appetite.   Pain in your forehead, behind your eyes, and over your cheekbones (sinus pain).  Muscle aches.  DIAGNOSIS  Your health care provider may diagnose a URI by:  Physical exam.  Tests to check that your symptoms are not due to  another condition such as:  Strep throat.  Sinusitis.  Pneumonia.  Asthma. TREATMENT  A URI goes away on its own with time. It cannot be cured with medicines, but medicines may be prescribed or recommended to relieve symptoms. Medicines may help:  Reduce your fever.  Reduce your cough.  Relieve nasal congestion. HOME CARE INSTRUCTIONS   Take medicines only as directed by your health care provider.   Gargle warm saltwater or take cough drops to comfort your throat as directed by your health care provider.  Use a warm mist humidifier or inhale steam from a shower to increase air moisture. This may make it easier to breathe.  Drink enough fluid to keep your urine clear or pale yellow.   Eat soups and other clear broths and maintain good nutrition.   Rest as needed.   Return to work when your temperature has returned to normal or as your health care provider advises. You may need to stay home longer to avoid infecting others. You can also use a face mask and careful hand washing to prevent spread of the virus.  Increase the usage of your inhaler if you have asthma.   Do not use any tobacco products, including cigarettes, chewing tobacco, or electronic cigarettes. If you need help quitting, ask your health care provider. PREVENTION  The best way to protect yourself from getting a cold is to practice good hygiene.   Avoid oral or hand contact with people with cold   symptoms.   Wash your hands often if contact occurs.  There is no clear evidence that vitamin C, vitamin E, echinacea, or exercise reduces the chance of developing a cold. However, it is always recommended to get plenty of rest, exercise, and practice good nutrition.  SEEK MEDICAL CARE IF:   You are getting worse rather than better.   Your symptoms are not controlled by medicine.   You have chills.  You have worsening shortness of breath.  You have brown or red mucus.  You have yellow or brown nasal  discharge.  You have pain in your face, especially when you bend forward.  You have a fever.  You have swollen neck glands.  You have pain while swallowing.  You have white areas in the back of your throat. SEEK IMMEDIATE MEDICAL CARE IF:   You have severe or persistent:  Headache.  Ear pain.  Sinus pain.  Chest pain.  You have chronic lung disease and any of the following:  Wheezing.  Prolonged cough.  Coughing up blood.  A change in your usual mucus.  You have a stiff neck.  You have changes in your:  Vision.  Hearing.  Thinking.  Mood. MAKE SURE YOU:   Understand these instructions.  Will watch your condition.  Will get help right away if you are not doing well or get worse.   This information is not intended to replace advice given to you by your health care provider. Make sure you discuss any questions you have with your health care provider.   Document Released: 11/30/2000 Document Revised: 10/21/2014 Document Reviewed: 09/11/2013 Elsevier Interactive Patient Education 2016 [employer]  

## 2015-04-04 NOTE — ED Notes (Signed)
Patient transported to X-ray 

## 2015-04-04 NOTE — ED Notes (Signed)
Cough for several weeks and was dx with bronchitis- has been eval by PCP and was given flu shot- states cough is not any better

## 2015-04-04 NOTE — ED Provider Notes (Signed)
CSN: [id number]     Arrival date & time 04/04/15  1506 History  By signing my name below, I, Hansel Feinstein, attest that this documentation has been prepared under the direction and in the presence of Malvin Johns, MD. Electronically Signed: Hansel Feinstein, ED Scribe. 04/04/2015. 3:59 PM.    Chief Complaint  Patient presents with  . Cough   The history is provided by the patient. No language interpreter was used.    HPI Comments: Megen Madewell is a 54 y.o. female with h/o CAD, HTN, HLD, acute R MCA stroke, MI, pneumonia who presents to the Emergency Department complaining of moderate, intermittent productive cough with clear phlegm onset 2 weeks ago and worsened this week. Pt states associated wheezing. Pt has a recent dx of bronchitis on 03/17/15 from a visit to the ED with c/o productive cough, congestion, rhinorrhea. She had CXR with no acute findings and no evidence of Pneumonia; d/c with rx for prednisone and albuterol inhaler. Pt states no relief with this treatment. She notes that she was seen by her PCP and given a flu shot since then. She states no relief of cough with OTC delsym, but notes resolution of prior congestion and rhinorrhea with OTC Mucinex. Pt is still using the albuterol inhaler with no relief of wheezing. She notes that she has finished the course of prednisone. PCP is Dr. Etter Sjogren. She denies SOB, fever, current rhinorrhea or congestion, CP, vomiting, diarrhea, leg swelling.    Past Medical History  Diagnosis Date  . PFO (patent foramen ovale) August 2012    PFO seen on TEE during hospitalization in 01/2011.  Patient to f/u with Dr. Leonie Man, neurology, for enrollment in trial for medical treatment of PFO  . CAD (coronary artery disease) 2007    istory of MI with stent in 2007 by Dr. Chancy Milroy, [facility]. Stent placement by Dr Cleatis Polka   . Prediabetes 03/07/2011  . Hypertension 03/07/2011  . Hyperlipidemia 03/07/2011  . Tobacco abuse 03/07/2011  . Depression 03/07/2011  . Psoriasis  03/07/2011  . Acute right MCA stroke ([place]) 01/27/11    Acute R MCA stroked  with L arm and leg weakness  . Anxiety 1980s    On Klonipin since age 37. Had addiction problem with Xanax which  Was therefore d/c . Seen in the past at [facility].   . Fractured toe 01/2011    History of left fifth toe proximal phalanx fracture when patient had a stroke (01/2011) and fell. Seen by Dr Doran Durand.   . Incontinence 04/14/2011  . Arthritis 04/16/2011  . Tobacco abuse 03/07/2011    Quit 01/2011   . Fracture of fifth toe, left, closed 01/28/2011    History of left fifth toe proximal phalanx fracture when patient had a stroke (01/2011) and fell. Seen by Dr Doran Durand.    . Substance abuse     h/o narcotic abuse pt denies as of 01/09/12  . Myocardial infarct, old 2008    stents  . Blind right eye   . Pneumonia    Past Surgical History  Procedure Laterality Date  . Cardiac stent      2008  . Eye surgery     Family History  Problem Relation Age of Onset  . Heart disease Mother   . Diabetes Mother   . Heart disease Father   . Diabetes Father   . Heart disease Brother   . Diabetes Brother   . Heart disease Maternal Grandmother   . Heart disease Maternal Grandfather   .  Heart disease Paternal Grandmother   . Heart disease Paternal Grandfather   . Lung cancer Brother   . Heart disease Brother   . Cancer Sister 37    ovarian   Social History  Substance Use Topics  . Smoking status: Current Some Day Smoker -- 0.50 packs/day for 30 years    Types: Cigarettes    Last Attempt to Quit: 04/07/2014  . Smokeless tobacco: Never Used     Comment: Trying to quit again.  . Alcohol Use: No   OB History    No data available     Review of Systems  Constitutional: Negative for fever, chills, diaphoresis and fatigue.  HENT: Negative for congestion, rhinorrhea and sneezing.   Eyes: Negative.   Respiratory: Positive for cough. Negative for chest tightness and shortness of breath.   Cardiovascular: Negative  for chest pain and leg swelling.  Gastrointestinal: Negative for nausea, vomiting, abdominal pain, diarrhea and blood in stool.  Genitourinary: Negative for frequency, hematuria, flank pain and difficulty urinating.  Musculoskeletal: Negative for back pain and arthralgias.  Skin: Negative for rash.  Neurological: Negative for dizziness, speech difficulty, weakness, numbness and headaches.   Allergies  Azor and Cozaar  Home Medications   Prior to Admission medications   Medication Sig Start Date End Date Taking? Authorizing Provider  BLACK COHOSH PO Take by mouth.   Yes Historical Provider, MD  albuterol (PROVENTIL HFA;VENTOLIN HFA) 108 (90 BASE) MCG/ACT inhaler Inhale 2 puffs into the lungs every 4 (four) hours as needed for wheezing or shortness of breath (or coughing). 12/17/50   Delora Fuel, MD  allopurinol (ZYLOPRIM) 300 MG tablet TAKE 1 TABLET BY MOUTH EVERY DAY 11/07/14   Sid Falcon, MD  amLODipine (NORVASC) 5 MG tablet TAKE 2 TABLETS BY MOUTH EVERY DAY 11/07/14   Sid Falcon, MD  atorvastatin (LIPITOR) 10 MG tablet TAKE 1 TABLET BY MOUTH EVERY DAY AT 6PM 11/07/14   Sid Falcon, MD  atropine 1 % ophthalmic solution  03/20/15   Historical Provider, MD  BENICAR 40 MG tablet TAKE 1 TABLET BY MOUTH DAILY 02/19/15   Axel Filler, MD  clopidogrel (PLAVIX) 75 MG tablet TAKE 1 TABLET BY MOUTH EVERY DAY 11/07/14   Sid Falcon, MD  eszopiclone Johnnye Sima) 2 MG TABS tablet  03/18/15   Historical Provider, MD  HYDROcodone-homatropine (HYCODAN) 5-1.5 MG/5ML syrup Take 5 mLs by mouth every 6 (six) hours as needed for cough. 04/04/15   Malvin Johns, MD  LORazepam (ATIVAN) 1 MG tablet Take 1 tablet (1 mg total) by mouth at bedtime as needed for anxiety or sleep. 03/27/15   Rosalita Chessman, DO  metoprolol (LOPRESSOR) 100 MG tablet Take 1 tablet (100 mg total) by mouth 2 (two) times daily. 02/17/15   Rosalita Chessman, DO  potassium chloride (MICRO-K) 10 MEQ CR capsule Take 2 capsules (20 mEq  total) by mouth daily. 09/15/14   Oval Linsey, MD  prednisoLONE acetate (PRED FORTE) 1 % ophthalmic suspension 1 drop 4 (four) times daily.    Historical Provider, MD  tiZANidine (ZANAFLEX) 4 MG tablet Take 2 tablets (8 mg total) by mouth 2 (two) times daily. 04/02/15   Garvin Fila, MD   BP 168/88 mmHg  Pulse 88  Temp(Src) 98 F (36.7 C)  Resp 18  Ht 5\' 5"  (1.651 m)  Wt 201 lb (91.173 kg)  BMI 33.45 kg/m2  SpO2 98% Physical Exam  Constitutional: She is oriented to person, place, and time. She  appears well-developed and well-nourished.  HENT:  Head: Normocephalic and atraumatic.  Eyes: Pupils are equal, round, and reactive to light.  Neck: Normal range of motion. Neck supple.  Cardiovascular: Normal rate, regular rhythm and normal heart sounds.   Pulmonary/Chest: Effort normal and breath sounds normal. No respiratory distress. She has no wheezes. She has no rales. She exhibits no tenderness.  Abdominal: Soft. Bowel sounds are normal. There is no tenderness. There is no rebound and no guarding.  Musculoskeletal: Normal range of motion. She exhibits no edema.  No edema or calf tenderness  Lymphadenopathy:    She has no cervical adenopathy.  Neurological: She is alert and oriented to person, place, and time.  Skin: Skin is warm and dry. No rash noted.  Psychiatric: She has a normal mood and affect.    ED Course  Procedures (including critical care time) DIAGNOSTIC STUDIES: Oxygen Saturation is 98% on RA, normal by my interpretation.    COORDINATION OF CARE: 3:56 PM Discussed treatment plan with pt at bedside and pt agreed to plan.  Imaging Review Dg Chest 2 View  04/04/2015  CLINICAL DATA:  Productive cough for 1 week EXAM: CHEST  2 VIEW COMPARISON:  03/26/2015 FINDINGS: The heart size and mediastinal contours are within normal limits. Both lungs are clear. The visualized skeletal structures are unremarkable. IMPRESSION: No active cardiopulmonary disease. Electronically  Signed   By: Lahoma Crocker M.D.   On: 04/04/2015 16:11   I have personally reviewed and evaluated these images as part of my medical decision-making.  MDM   Final diagnoses:  Bronchitis    Patient has no evidence of pneumonia. She's afebrile. She has no shortness of breath or hypoxia. This likely is a residual cough from the bronchitis. I don't hear any wheezing on exam. She was discharged home with a prescription for Hycodan cough syrup. She was encouraged to follow-up with her PCP if her symptoms are not improving.  I personally performed the services described in this documentation, which was scribed in my presence.  The recorded information has been reviewed and considered.   Malvin Johns, MD 04/04/15 [phone]

## 2015-04-04 NOTE — ED Notes (Signed)
MD at bedside discussing dispo plan of care. 

## 2015-04-08 DIAGNOSIS — F411 Generalized anxiety disorder: Secondary | ICD-10-CM | POA: Insufficient documentation

## 2015-04-08 DIAGNOSIS — F341 Dysthymic disorder: Secondary | ICD-10-CM | POA: Diagnosis not present

## 2015-04-30 ENCOUNTER — Other Ambulatory Visit: Payer: Self-pay | Admitting: Student in an Organized Health Care Education/Training Program

## 2015-05-04 ENCOUNTER — Other Ambulatory Visit: Payer: Self-pay | Admitting: Family Medicine

## 2015-05-04 ENCOUNTER — Telehealth: Payer: Self-pay | Admitting: Family Medicine

## 2015-05-04 ENCOUNTER — Encounter (HOSPITAL_BASED_OUTPATIENT_CLINIC_OR_DEPARTMENT_OTHER): Payer: Self-pay | Admitting: Emergency Medicine

## 2015-05-04 ENCOUNTER — Emergency Department (HOSPITAL_BASED_OUTPATIENT_CLINIC_OR_DEPARTMENT_OTHER): Payer: Commercial Managed Care - HMO

## 2015-05-04 ENCOUNTER — Emergency Department (HOSPITAL_BASED_OUTPATIENT_CLINIC_OR_DEPARTMENT_OTHER)
Admission: EM | Admit: 2015-05-04 | Discharge: 2015-05-04 | Disposition: A | Discharge status: Home / Self Care | Payer: Commercial Managed Care - HMO | Attending: Emergency Medicine | Admitting: Emergency Medicine

## 2015-05-04 DIAGNOSIS — M545 Low back pain: Secondary | ICD-10-CM | POA: Diagnosis not present

## 2015-05-04 DIAGNOSIS — Z8781 Personal history of (healed) traumatic fracture: Secondary | ICD-10-CM | POA: Diagnosis not present

## 2015-05-04 DIAGNOSIS — E785 Hyperlipidemia, unspecified: Secondary | ICD-10-CM | POA: Diagnosis not present

## 2015-05-04 DIAGNOSIS — Z8701 Personal history of pneumonia (recurrent): Secondary | ICD-10-CM | POA: Insufficient documentation

## 2015-05-04 DIAGNOSIS — F329 Major depressive disorder, single episode, unspecified: Secondary | ICD-10-CM | POA: Insufficient documentation

## 2015-05-04 DIAGNOSIS — I2510 Atherosclerotic heart disease of native coronary artery without angina pectoris: Secondary | ICD-10-CM | POA: Diagnosis not present

## 2015-05-04 DIAGNOSIS — Z7952 Long term (current) use of systemic steroids: Secondary | ICD-10-CM | POA: Insufficient documentation

## 2015-05-04 DIAGNOSIS — I252 Old myocardial infarction: Secondary | ICD-10-CM | POA: Insufficient documentation

## 2015-05-04 DIAGNOSIS — M1990 Unspecified osteoarthritis, unspecified site: Secondary | ICD-10-CM | POA: Insufficient documentation

## 2015-05-04 DIAGNOSIS — I10 Essential (primary) hypertension: Secondary | ICD-10-CM | POA: Insufficient documentation

## 2015-05-04 DIAGNOSIS — H5441 Blindness, right eye, normal vision left eye: Secondary | ICD-10-CM | POA: Insufficient documentation

## 2015-05-04 DIAGNOSIS — S34109A Unspecified injury to unspecified level of lumbar spinal cord, initial encounter: Secondary | ICD-10-CM | POA: Diagnosis not present

## 2015-05-04 DIAGNOSIS — Z79899 Other long term (current) drug therapy: Secondary | ICD-10-CM | POA: Diagnosis not present

## 2015-05-04 DIAGNOSIS — Z8673 Personal history of transient ischemic attack (TIA), and cerebral infarction without residual deficits: Secondary | ICD-10-CM | POA: Diagnosis not present

## 2015-05-04 DIAGNOSIS — M5136 Other intervertebral disc degeneration, lumbar region: Secondary | ICD-10-CM | POA: Insufficient documentation

## 2015-05-04 DIAGNOSIS — F419 Anxiety disorder, unspecified: Secondary | ICD-10-CM | POA: Insufficient documentation

## 2015-05-04 DIAGNOSIS — Z872 Personal history of diseases of the skin and subcutaneous tissue: Secondary | ICD-10-CM | POA: Diagnosis not present

## 2015-05-04 DIAGNOSIS — F17210 Nicotine dependence, cigarettes, uncomplicated: Secondary | ICD-10-CM | POA: Diagnosis not present

## 2015-05-04 DIAGNOSIS — M549 Dorsalgia, unspecified: Secondary | ICD-10-CM

## 2015-05-04 LAB — URINALYSIS, ROUTINE W REFLEX MICROSCOPIC
Bilirubin Urine: NEGATIVE
GLUCOSE, UA: 250 mg/dL — AB
Hgb urine dipstick: NEGATIVE
Ketones, ur: NEGATIVE mg/dL
LEUKOCYTES UA: NEGATIVE
Nitrite: NEGATIVE
PROTEIN: NEGATIVE mg/dL
Specific Gravity, Urine: 1.027 (ref 1.005–1.030)
Urobilinogen, UA: 1 mg/dL (ref 0.0–1.0)
pH: 5.5 (ref 5.0–8.0)

## 2015-05-04 MED ORDER — HYDROCODONE-ACETAMINOPHEN 5-325 MG PO TABS: 1.0000 | ORAL_TABLET | Freq: Four times a day (QID) | ORAL | Status: DC | PRN

## 2015-05-04 MED ORDER — KETOROLAC TROMETHAMINE 30 MG/ML IJ SOLN
60.0000 mg | Freq: Once | INTRAMUSCULAR | Status: AC
  Administered 2015-05-04: 60 mg via INTRAMUSCULAR
  Filled 2015-05-04: qty 2

## 2015-05-04 NOTE — ED Notes (Signed)
Pt fell 10 days ago and is having lower back pain.  Pt states she may have uti.  Pt states she is using the bathroom a lot.  No burning.  Some urgency.  No fever.

## 2015-05-04 NOTE — Telephone Encounter (Signed)
Caller name: Dexter  Relationship to patient: Spouse   Can be reached: [phone]    Reason for call: Pt's wife called in to get a referral to sports medicine. He says that they called sports medicine and was told that pcp has to put in referral first.

## 2015-05-04 NOTE — Telephone Encounter (Signed)
She is in the ED right now with the LBP

## 2015-05-04 NOTE — ED Provider Notes (Signed)
CSN: [id number]     Arrival date & time 05/04/15  [id number] History   First MD Initiated Contact with Patient 05/04/15 0911     Chief Complaint  Patient presents with  . Back Pain     (Consider location/radiation/quality/duration/timing/severity/associated sxs/prior Treatment) HPI Comments: Pt comes in with c/o right lower back pain that started a couple of days ago. She states that she is having urgency and frequency. Denies fever, numbness or weakness. She states that she fell 10 days ago but that is not when the symptoms started. Hasn't been taking anything for the symptoms. Feels similar to previous uti  The history is provided by the patient. No language interpreter was used.    Past Medical History  Diagnosis Date  . PFO (patent foramen ovale) August 2012    PFO seen on TEE during hospitalization in 01/2011.  Patient to f/u with Dr. Leonie Man, neurology, for enrollment in trial for medical treatment of PFO  . CAD (coronary artery disease) 2007    istory of MI with stent in 2007 by Dr. Chancy Milroy, [facility]. Stent placement by Dr Cleatis Polka   . Prediabetes 03/07/2011  . Hypertension 03/07/2011  . Hyperlipidemia 03/07/2011  . Tobacco abuse 03/07/2011  . Depression 03/07/2011  . Psoriasis 03/07/2011  . Acute right MCA stroke ([place]) 01/27/11    Acute R MCA stroked  with L arm and leg weakness  . Anxiety 1980s    On Klonipin since age 34. Had addiction problem with Xanax which  Was therefore d/c . Seen in the past at [facility].   . Fractured toe 01/2011    History of left fifth toe proximal phalanx fracture when patient had a stroke (01/2011) and fell. Seen by Dr Doran Durand.   . Incontinence 04/14/2011  . Arthritis 04/16/2011  . Tobacco abuse 03/07/2011    Quit 01/2011   . Fracture of fifth toe, left, closed 01/28/2011    History of left fifth toe proximal phalanx fracture when patient had a stroke (01/2011) and fell. Seen by Dr Doran Durand.    . Substance abuse     h/o narcotic abuse pt denies as  of 01/09/12  . Myocardial infarct, old 2008    stents  . Blind right eye   . Pneumonia    Past Surgical History  Procedure Laterality Date  . Cardiac stent      2008  . Eye surgery     Family History  Problem Relation Age of Onset  . Heart disease Mother   . Diabetes Mother   . Heart disease Father   . Diabetes Father   . Heart disease Brother   . Diabetes Brother   . Heart disease Maternal Grandmother   . Heart disease Maternal Grandfather   . Heart disease Paternal Grandmother   . Heart disease Paternal Grandfather   . Lung cancer Brother   . Heart disease Brother   . Cancer Sister 59    ovarian   Social History  Substance Use Topics  . Smoking status: Current Some Day Smoker -- 0.50 packs/day for 30 years    Types: Cigarettes    Last Attempt to Quit: 04/07/2014  . Smokeless tobacco: Never Used     Comment: Trying to quit again.  . Alcohol Use: No   OB History    No data available     Review of Systems  All other systems reviewed and are negative.     Allergies  Azor and Cozaar  Home Medications  Prior to Admission medications   Medication Sig Start Date End Date Taking? Authorizing Provider  albuterol (PROVENTIL HFA;VENTOLIN HFA) 108 (90 BASE) MCG/ACT inhaler Inhale 2 puffs into the lungs every 4 (four) hours as needed for wheezing or shortness of breath (or coughing). [id number]   Delora Fuel, MD  allopurinol (ZYLOPRIM) 300 MG tablet TAKE 1 TABLET BY MOUTH EVERY DAY 11/07/14   Sid Falcon, MD  amLODipine (NORVASC) 5 MG tablet TAKE 2 TABLETS BY MOUTH EVERY DAY 11/07/14   Sid Falcon, MD  atorvastatin (LIPITOR) 10 MG tablet TAKE 1 TABLET BY MOUTH EVERY DAY AT [facility] 11/07/14   Sid Falcon, MD  atropine 1 % ophthalmic solution  03/20/15   Historical Provider, MD  BENICAR 40 MG tablet TAKE 1 TABLET BY MOUTH DAILY 02/19/15   Axel Filler, MD  BLACK COHOSH PO Take by mouth.    Historical Provider, MD  clopidogrel (PLAVIX) 75 MG tablet TAKE 1 TABLET BY  MOUTH EVERY DAY 11/07/14   Sid Falcon, MD  eszopiclone Johnnye Sima) 2 MG TABS tablet  03/18/15   Historical Provider, MD  HYDROcodone-homatropine (HYCODAN) 5-1.5 MG/5ML syrup Take 5 mLs by mouth every 6 (six) hours as needed for cough. 04/04/15   Malvin Johns, MD  LORazepam (ATIVAN) 1 MG tablet Take 1 tablet (1 mg total) by mouth at bedtime as needed for anxiety or sleep. 03/27/15   Rosalita Chessman, DO  metoprolol (LOPRESSOR) 100 MG tablet Take 1 tablet (100 mg total) by mouth 2 (two) times daily. 02/17/15   Rosalita Chessman, DO  potassium chloride (MICRO-K) 10 MEQ CR capsule Take 2 capsules (20 mEq total) by mouth daily. 09/15/14   Oval Linsey, MD  prednisoLONE acetate (PRED FORTE) 1 % ophthalmic suspension 1 drop 4 (four) times daily.    Historical Provider, MD  tiZANidine (ZANAFLEX) 4 MG tablet Take 2 tablets (8 mg total) by mouth 2 (two) times daily. 04/02/15   Garvin Fila, MD   BP 126/74 mmHg  Pulse 64  Temp(Src) 97.6 F (36.4 C) (Oral)  Resp 16  Ht 5\' 6"  (1.676 m)  Wt 201 lb (91.173 kg)  BMI 32.46 kg/m2 Physical Exam  Constitutional: She is oriented to person, place, and time. She appears well-developed and well-nourished.  HENT:  Head: Normocephalic and atraumatic.  Cardiovascular: Normal rate and regular rhythm.   Pulmonary/Chest: Effort normal and breath sounds normal.  Abdominal: Soft. Bowel sounds are normal. There is no tenderness.  Musculoskeletal: Normal range of motion.  Neurological: She is alert and oriented to person, place, and time. Coordination normal.  Skin: Skin is warm and dry.  Psychiatric: She has a normal mood and affect.  Nursing note and vitals reviewed.   ED Course  Procedures (including critical care time) Labs Review Labs Reviewed  URINALYSIS, ROUTINE W REFLEX MICROSCOPIC (NOT AT [facility]) - Abnormal; Notable for the following:    APPearance CLOUDY (*)    Glucose, UA 250 (*)    All other components within normal limits    Imaging Review Dg Lumbar  Spine Complete  05/04/2015  CLINICAL DATA:  Status post fall approximately 10 days ago. Low back pain for a few days. Initial encounter. EXAM: LUMBAR SPINE - COMPLETE 4+ VIEW COMPARISON:  Plain films lumbar spine 12/06/2012 and 05/16/2008. FINDINGS: Vertebral body height is maintained. 0.4 cm anterolisthesis L4 on L5 is due to facet degenerative disease and new since the prior studies. Advanced facet arthropathy is seen at L4-5 and L5-S1. Intervertebral  disc space height is maintained. Aortic atherosclerosis is noted. IMPRESSION: No acute abnormality. Facet degenerative disease L4-5 and L5-S1 with 0.4 cm anterolisthesis L4 on L5 which is new since the prior exams. Electronically Signed   By: Inge Rise M.D.   On: 05/04/2015 10:42   I have personally reviewed and evaluated these images and lab results as part of my medical decision-making.   EKG Interpretation None      MDM   Final diagnoses:  Degenerative disc disease, lumbar    Degenerative changes noted on x-ray. No red flag symptoms. Pt has muscle relaxer at home and will given hydrocodone for pain. Given follow up with Dr. Barbaraann Barthel for continued symptoms    Glendell Docker, NP 05/04/15 [place], MD 05/04/15 1510

## 2015-05-04 NOTE — Telephone Encounter (Signed)
In the ED for LBP. Please advise      KP

## 2015-05-04 NOTE — Telephone Encounter (Signed)
Referral is in.

## 2015-05-04 NOTE — ED Notes (Signed)
Patient transported to and from xray dept via stretcher. 

## 2015-05-04 NOTE — Discharge Instructions (Signed)
Degenerative Disk Disease  Degenerative disk disease is a condition caused by the changes that occur in spinal disks as you grow older. Spinal disks are soft and compressible disks located between the bones of your spine (vertebrae). These disks act like shock absorbers. Degenerative disk disease can affect the whole spine. However, the neck and lower back are most commonly affected. Many changes can occur in the spinal disks with aging, such as:  · The spinal disks may dry and shrink.  · Small tears may occur in the tough, outer covering of the disk (annulus).  · The disk space may become smaller due to loss of water.  · Abnormal growths in the bone (spurs) may occur. This can put pressure on the nerve roots exiting the spinal canal, causing pain.  · The spinal canal may become narrowed.  RISK FACTORS   · Being overweight.  · Having a family history of degenerative disk disease.  · Smoking.  · There is increased risk if you are doing heavy lifting or have a sudden injury.  SIGNS AND SYMPTOMS   Symptoms vary from person to person and may include:  · Pain that varies in intensity. Some people have no pain, while others have severe pain. The location of the pain depends on the part of your backbone that is affected.  ¨ You will have neck or arm pain if a disk in the neck area is affected.  ¨ You will have pain in your back, buttocks, or legs if a disk in the lower back is affected.  · Pain that becomes worse while bending, reaching up, or with twisting movements.  · Pain that may start gradually and then get worse as time passes. It may also start after a major or minor injury.  · Numbness or tingling in the arms or legs.  DIAGNOSIS   Your health care provider will ask you about your symptoms and about activities or habits that may cause the pain. He or she may also ask about any injuries, diseases, or treatments you have had. Your health care provider will examine you to check for the range of movement that is  possible in the affected area, to check for strength in your extremities, and to check for sensation in the areas of the arms and legs supplied by different nerve roots. You may also have:   · An X-ray of the spine.  · Other imaging tests, such as MRI.  TREATMENT   Your health care provider will advise you on the best plan for treatment. Treatment may include:  · Medicines.  · Rehabilitation exercises.  HOME CARE INSTRUCTIONS   · Follow proper lifting and walking techniques as advised by your health care provider.  · Maintain good posture.  · Exercise regularly as advised by your health care provider.  · Perform relaxation exercises.  · Change your sitting, standing, and sleeping habits as advised by your health care provider.  · Change positions frequently.  · Lose weight or maintain a healthy weight as advised by your health care provider.  · Do not use any tobacco products, including cigarettes, chewing tobacco, or electronic cigarettes. If you need help quitting, ask your health care provider.  · Wear supportive footwear.  · Take medicines only as directed by your health care provider.  SEEK MEDICAL CARE IF:   · Your pain does not go away within 1-4 weeks.  · You have significant appetite or weight loss.  SEEK IMMEDIATE MEDICAL CARE IF:   ·   Your pain is severe.  · You notice weakness in your arms, hands, or legs.  · You begin to lose control of your bladder or bowel movements.  · You have fevers or night sweats.  MAKE SURE YOU:   · Understand these instructions.  · Will watch your condition.  · Will get help right away if you are not doing well or get worse.     This information is not intended to replace advice given to you by your health care provider. Make sure you discuss any questions you have with your health care provider.     Document Released: 04/03/2007 Document Revised: 06/27/2014 Document Reviewed: 10/08/2013  Elsevier Interactive Patient Education ©2016 [employer]

## 2015-05-04 NOTE — Telephone Encounter (Signed)
I need to know why-- and if she has not been seen for the problem she needs to be seen first

## 2015-05-05 ENCOUNTER — Ambulatory Visit (INDEPENDENT_AMBULATORY_CARE_PROVIDER_SITE_OTHER): Payer: Commercial Managed Care - HMO | Admitting: Family Medicine

## 2015-05-05 ENCOUNTER — Encounter: Payer: Self-pay | Admitting: Family Medicine

## 2015-05-05 VITALS — BP 132/72 | HR 50 | Ht 66.0 in | Wt 201.4 lb

## 2015-05-05 DIAGNOSIS — M47817 Spondylosis without myelopathy or radiculopathy, lumbosacral region: Secondary | ICD-10-CM

## 2015-05-05 MED ORDER — OXYCODONE-ACETAMINOPHEN 5-325 MG PO TABS: 1.0000 | ORAL_TABLET | Freq: Four times a day (QID) | ORAL | Status: DC | PRN

## 2015-05-05 MED ORDER — DICLOFENAC SODIUM 75 MG PO TBEC: 75.0000 mg | DELAYED_RELEASE_TABLET | Freq: Two times a day (BID) | ORAL | Status: DC

## 2015-05-05 MED ORDER — PREDNISONE 10 MG PO TABS: ORAL_TABLET | ORAL | Status: DC

## 2015-05-05 MED ORDER — TIZANIDINE HCL 4 MG PO TABS: 4.0000 mg | ORAL_TABLET | Freq: Three times a day (TID) | ORAL | Status: DC | PRN

## 2015-05-05 NOTE — Patient Instructions (Signed)
You have facet arthropathy (arthritis of your low back) with suggestions of a pinched nerve into your right leg. A prednisone dose pack is the best option for immediate relief and may be prescribed. Day after finishing prednisone start voltaren 75mg  twice a day with food for pain and inflammation. Percocet as needed for severe pain (no driving on this medicine - we do not refill this). Robaxin as needed for muscle spasms (no driving on this medicine if it makes you sleepy). Stay as active as possible. Physical therapy has been shown to be helpful as well. Strengthening of low back muscles, abdominal musculature are key for long term pain relief. If not improving, will consider further imaging (MRI). Call me in 1-2 weeks to let me know how you're doing.

## 2015-05-07 NOTE — Progress Notes (Addendum)
PCP and consultation requested by: Garnet Koyanagi, DO  Subjective:   HPI: Patient is a 54 y.o. female here for low back pain.  Patient reports she has prior history of low back issues. Current problem started 5 days ago without injury. Pain level 8/10, sharp and constant. Pain in right side of low back radiating into right leg. Taking hydrocodone without much benefit. Radiographs showed facet arthritis. No skin changes, fever, other complaints. No bowel/bladder dysfunction.  Past Medical History  Diagnosis Date  . PFO (patent foramen ovale) August 2012    PFO seen on TEE during hospitalization in 01/2011.  Patient to f/u with Dr. Leonie Man, neurology, for enrollment in trial for medical treatment of PFO  . CAD (coronary artery disease) 2007    istory of MI with stent in 2007 by Dr. Chancy Milroy, [facility]. Stent placement by Dr Cleatis Polka   . Prediabetes 03/07/2011  . Hypertension 03/07/2011  . Hyperlipidemia 03/07/2011  . Tobacco abuse 03/07/2011  . Depression 03/07/2011  . Psoriasis 03/07/2011  . Acute right MCA stroke ([place]) 01/27/11    Acute R MCA stroked  with L arm and leg weakness  . Anxiety 1980s    On Klonipin since age 59. Had addiction problem with Xanax which  Was therefore d/c . Seen in the past at [facility].   . Fractured toe 01/2011    History of left fifth toe proximal phalanx fracture when patient had a stroke (01/2011) and fell. Seen by Dr Doran Durand.   . Incontinence 04/14/2011  . Arthritis 04/16/2011  . Tobacco abuse 03/07/2011    Quit 01/2011   . Fracture of fifth toe, left, closed 01/28/2011    History of left fifth toe proximal phalanx fracture when patient had a stroke (01/2011) and fell. Seen by Dr Doran Durand.    . Substance abuse     h/o narcotic abuse pt denies as of 01/09/12  . Myocardial infarct, old 2008    stents  . Blind right eye   . Pneumonia     Current Outpatient Prescriptions on File Prior to Visit  Medication Sig Dispense Refill  . albuterol (PROVENTIL  HFA;VENTOLIN HFA) 108 (90 BASE) MCG/ACT inhaler Inhale 2 puffs into the lungs every 4 (four) hours as needed for wheezing or shortness of breath (or coughing). 1 Inhaler 0  . allopurinol (ZYLOPRIM) 300 MG tablet TAKE 1 TABLET BY MOUTH EVERY DAY 30 tablet 6  . amLODipine (NORVASC) 5 MG tablet TAKE 2 TABLETS BY MOUTH EVERY DAY 60 tablet 6  . atorvastatin (LIPITOR) 10 MG tablet TAKE 1 TABLET BY MOUTH EVERY DAY AT 6PM 30 tablet 6  . atropine 1 % ophthalmic solution     . BENICAR 40 MG tablet TAKE 1 TABLET BY MOUTH DAILY 30 tablet 2  . BLACK COHOSH PO Take by mouth.    . clopidogrel (PLAVIX) 75 MG tablet TAKE 1 TABLET BY MOUTH EVERY DAY 30 tablet 6  . eszopiclone (LUNESTA) 2 MG TABS tablet     . LORazepam (ATIVAN) 1 MG tablet Take 1 tablet (1 mg total) by mouth at bedtime as needed for anxiety or sleep. 30 tablet 0  . metoprolol (LOPRESSOR) 100 MG tablet Take 1 tablet (100 mg total) by mouth 2 (two) times daily. 60 tablet 5  . potassium chloride (MICRO-K) 10 MEQ CR capsule Take 2 capsules (20 mEq total) by mouth daily. 60 capsule 11  . prednisoLONE acetate (PRED FORTE) 1 % ophthalmic suspension 1 drop 4 (four) times daily.  No current facility-administered medications on file prior to visit.    Past Surgical History  Procedure Laterality Date  . Cardiac stent      2008  . Eye surgery      Allergies  Allergen Reactions  . Azor [Amlodipine-Olmesartan] Other (See Comments)    Drops blood pressure too low (takes plain amlodipine at home July 2015)  . Cozaar [Losartan Potassium]     Social History   Social History  . Marital Status: Married    Spouse Name: dexter  . Number of Children: 1  . Years of Education: 12   Occupational History  . Disabled    Social History Main Topics  . Smoking status: Current Some Day Smoker -- 0.50 packs/day for 30 years    Types: Cigarettes    Last Attempt to Quit: 04/07/2014  . Smokeless tobacco: Never Used     Comment: Trying to quit again.  .  Alcohol Use: No  . Drug Use: No  . Sexual Activity:    Partners: Male   Other Topics Concern  . Not on file   Social History Narrative   Pt married and lives with husband.     Family History  Problem Relation Age of Onset  . Heart disease Mother   . Diabetes Mother   . Heart disease Father   . Diabetes Father   . Heart disease Brother   . Diabetes Brother   . Heart disease Maternal Grandmother   . Heart disease Maternal Grandfather   . Heart disease Paternal Grandmother   . Heart disease Paternal Grandfather   . Lung cancer Brother   . Heart disease Brother   . Cancer Sister 12    ovarian    BP 132/72 mmHg  Pulse 50  Ht 5' 6"  (1.676 m)  Wt 201 lb 6.4 oz (91.354 kg)  BMI 32.52 kg/m2  Review of Systems: See HPI above.    Objective:  Physical Exam:  Gen: NAD  Back: No gross deformity, scoliosis. TTP right > left paraspinal lumbar regions and buttocks.  No midline or bony TTP. FROM with pain on extension > flexion. Strength LEs 5/5 all muscle groups.   2+ MSRs in patellar and achilles tendons, equal bilaterally. Negative SLRs. Sensation intact to light touch bilaterally.  Right hip: Negative logroll, fabers, piriformis stretches.    Assessment & Plan:  1. Low back pain - consistent with pain from facet arthropathy though she describes radiculopathy as well.  Start with prednisone dose pack, percocet and robaxin as needed.  Prescribed voltaren but discussed risks of heart attack, CVA with this - she may want to avoid taking this given her history.  Call us in 1-2 weeks for an update on her status - MRI if not improving, physical therapy if improving.  Addendum:  MRI reviewed and discussed with patient.  Most likely level causing her symptoms is L4-5 where she has moderate narrowing bilaterally.  She reports predominantly right sided symptoms so we will go ahead with ESI at this level on the right side.  She is to call us 1-2 weeks later for an update on her  status.

## 2015-05-07 NOTE — Assessment & Plan Note (Signed)
consistent with pain from facet arthropathy though she describes radiculopathy as well.  Start with prednisone dose pack, percocet and robaxin as needed.  Prescribed voltaren but discussed risks of heart attack, CVA with this - she may want to avoid taking this given her history.  Call us in 1-2 weeks for an update on her status - MRI if not improving, physical therapy if improving.

## 2015-05-29 ENCOUNTER — Other Ambulatory Visit (INDEPENDENT_AMBULATORY_CARE_PROVIDER_SITE_OTHER): Payer: Medicare Other

## 2015-05-29 ENCOUNTER — Other Ambulatory Visit: Payer: Self-pay | Admitting: Neurology

## 2015-05-29 ENCOUNTER — Other Ambulatory Visit: Payer: Self-pay | Admitting: Student in an Organized Health Care Education/Training Program

## 2015-05-29 ENCOUNTER — Ambulatory Visit: Payer: Commercial Managed Care - HMO

## 2015-05-29 ENCOUNTER — Other Ambulatory Visit: Payer: Self-pay | Admitting: Internal Medicine

## 2015-05-29 DIAGNOSIS — Z0000 Encounter for general adult medical examination without abnormal findings: Secondary | ICD-10-CM

## 2015-05-29 DIAGNOSIS — I10 Essential (primary) hypertension: Secondary | ICD-10-CM | POA: Diagnosis not present

## 2015-05-29 DIAGNOSIS — E876 Hypokalemia: Secondary | ICD-10-CM

## 2015-05-29 DIAGNOSIS — E785 Hyperlipidemia, unspecified: Secondary | ICD-10-CM

## 2015-05-29 LAB — LIPID PANEL
CHOLESTEROL: 125 mg/dL (ref 0–200)
HDL: 28.8 mg/dL — ABNORMAL LOW (ref >39.00)
LDL CALC: 59 mg/dL (ref 0–99)
NonHDL: 96.66
TRIGLYCERIDES: 190 mg/dL — AB (ref 0.0–149.0)
Total CHOL/HDL Ratio: 4
VLDL: 38 mg/dL (ref 0.0–40.0)

## 2015-05-29 LAB — CBC WITH DIFFERENTIAL/PLATELET
BASOS PCT: 0.3 % (ref 0.0–3.0)
Basophils Absolute: 0 10*3/uL (ref 0.0–0.1)
EOS ABS: 0.2 10*3/uL (ref 0.0–0.7)
Eosinophils Relative: 2.4 % (ref 0.0–5.0)
HEMATOCRIT: 40.3 % (ref 36.0–46.0)
Hemoglobin: 13.1 g/dL (ref 12.0–15.0)
LYMPHS PCT: 25.9 % (ref 12.0–46.0)
Lymphs Abs: 2.6 10*3/uL (ref 0.7–4.0)
MCHC: 32.5 g/dL (ref 30.0–36.0)
MCV: 88.7 fl (ref 78.0–100.0)
MONOS PCT: 3.7 % (ref 3.0–12.0)
Monocytes Absolute: 0.4 10*3/uL (ref 0.1–1.0)
NEUTROS ABS: 6.9 10*3/uL (ref 1.4–7.7)
Neutrophils Relative %: 67.7 % (ref 43.0–77.0)
PLATELETS: 211 10*3/uL (ref 150.0–400.0)
RBC: 4.54 Mil/uL (ref 3.87–5.11)
RDW: 14.7 % (ref 11.5–15.5)
WBC: 10.2 10*3/uL (ref 4.0–10.5)

## 2015-05-29 LAB — COMPREHENSIVE METABOLIC PANEL
ALT: 9 U/L (ref 0–35)
AST: 9 U/L (ref 0–37)
Albumin: 3.8 g/dL (ref 3.5–5.2)
Alkaline Phosphatase: 104 U/L (ref 39–117)
BUN: 13 mg/dL (ref 6–23)
CALCIUM: 9 mg/dL (ref 8.4–10.5)
CHLORIDE: 111 meq/L (ref 96–112)
CO2: 28 meq/L (ref 19–32)
Creatinine, Ser: 0.54 mg/dL (ref 0.40–1.20)
GFR: 125.12 mL/min (ref >60.00)
Glucose, Bld: 101 mg/dL — ABNORMAL HIGH (ref 70–99)
Potassium: 4 mEq/L (ref 3.5–5.1)
Sodium: 145 mEq/L (ref 135–145)
Total Bilirubin: 0.3 mg/dL (ref 0.2–1.2)
Total Protein: 6.2 g/dL (ref 6.0–8.3)

## 2015-05-29 LAB — POCT URINALYSIS DIPSTICK
Blood, UA: NEGATIVE
Glucose, UA: NEGATIVE
KETONES UA: NEGATIVE
Leukocytes, UA: NEGATIVE
Nitrite, UA: NEGATIVE
Spec Grav, UA: >=1.03
Urobilinogen, UA: 0.2
pH, UA: 6

## 2015-05-29 LAB — HEPATITIS C ANTIBODY: HCV Ab: NEGATIVE

## 2015-05-29 LAB — TSH: TSH: 0.46 u[IU]/mL (ref 0.35–4.50)

## 2015-06-01 ENCOUNTER — Other Ambulatory Visit: Payer: Self-pay | Admitting: Family Medicine

## 2015-06-01 NOTE — Telephone Encounter (Signed)
Last filled by ED doctor. Please advise     KP

## 2015-06-02 ENCOUNTER — Telehealth: Payer: Self-pay | Admitting: Family Medicine

## 2015-06-02 NOTE — Addendum Note (Signed)
Addended by: Sherrie George F on: 06/02/2015 04:16 PM   Modules accepted: Orders

## 2015-06-02 NOTE — Telephone Encounter (Signed)
Caller name: Self   Can be reached: [phone]  Pharmacy:  [facility] [place] [phone] (Phone) [phone] (Fax)         Reason for call: Request refill on BENICAR 40 MG tablet [[id number]]

## 2015-06-02 NOTE — Telephone Encounter (Signed)
Ok to go ahead with MRI of lumbar spine to assess.  We will consider an injection for pain following those results.  We do not refill the percocet - she would have to use tylenol, topical medicines, glucosamine for pain in meantime if she has run out.

## 2015-06-02 NOTE — Telephone Encounter (Signed)
Already ok'd

## 2015-06-02 NOTE — Telephone Encounter (Signed)
Spoke to patient and gave her information provided by physician. Told her we would set up MRI. Order placed.

## 2015-06-02 NOTE — Telephone Encounter (Signed)
Rx last filled at the ED. Please advise     KP

## 2015-06-03 ENCOUNTER — Other Ambulatory Visit: Payer: Self-pay

## 2015-06-03 MED ORDER — FENOFIBRATE 160 MG PO TABS: 160.0000 mg | ORAL_TABLET | Freq: Every day | ORAL | Status: DC

## 2015-06-04 ENCOUNTER — Telehealth: Payer: Self-pay

## 2015-06-04 NOTE — Telephone Encounter (Signed)
Lunesta requested.   Last seen 03/26/15. Never filled here.    Please advise     KP

## 2015-06-05 ENCOUNTER — Ambulatory Visit (INDEPENDENT_AMBULATORY_CARE_PROVIDER_SITE_OTHER): Payer: Medicare Other | Admitting: Family Medicine

## 2015-06-05 ENCOUNTER — Encounter: Payer: Self-pay | Admitting: Family Medicine

## 2015-06-05 VITALS — BP 124/72 | HR 48 | Temp 98.1°F | Ht 66.0 in | Wt 201.0 lb

## 2015-06-05 DIAGNOSIS — I639 Cerebral infarction, unspecified: Secondary | ICD-10-CM | POA: Diagnosis not present

## 2015-06-05 DIAGNOSIS — R17 Unspecified jaundice: Secondary | ICD-10-CM

## 2015-06-05 DIAGNOSIS — Z720 Tobacco use: Secondary | ICD-10-CM | POA: Diagnosis not present

## 2015-06-05 DIAGNOSIS — R319 Hematuria, unspecified: Secondary | ICD-10-CM

## 2015-06-05 DIAGNOSIS — E785 Hyperlipidemia, unspecified: Secondary | ICD-10-CM

## 2015-06-05 DIAGNOSIS — F332 Major depressive disorder, recurrent severe without psychotic features: Secondary | ICD-10-CM | POA: Diagnosis not present

## 2015-06-05 DIAGNOSIS — F17209 Nicotine dependence, unspecified, with unspecified nicotine-induced disorders: Secondary | ICD-10-CM

## 2015-06-05 DIAGNOSIS — G4700 Insomnia, unspecified: Secondary | ICD-10-CM | POA: Diagnosis not present

## 2015-06-05 DIAGNOSIS — I10 Essential (primary) hypertension: Secondary | ICD-10-CM

## 2015-06-05 LAB — POCT URINALYSIS DIPSTICK
BILIRUBIN UA: NEGATIVE
GLUCOSE UA: NEGATIVE
Ketones, UA: NEGATIVE
Leukocytes, UA: NEGATIVE
Nitrite, UA: NEGATIVE
Protein, UA: NEGATIVE
Urobilinogen, UA: 0.2
pH, UA: 6

## 2015-06-05 MED ORDER — AMLODIPINE BESYLATE 5 MG PO TABS: 10.0000 mg | ORAL_TABLET | Freq: Every day | ORAL | Status: DC

## 2015-06-05 MED ORDER — VORTIOXETINE HBR 10 MG PO TABS: 1.0000 | ORAL_TABLET | Freq: Every day | ORAL | Status: DC

## 2015-06-05 MED ORDER — METOPROLOL TARTRATE 100 MG PO TABS: 100.0000 mg | ORAL_TABLET | Freq: Two times a day (BID) | ORAL | Status: DC

## 2015-06-05 MED ORDER — ATORVASTATIN CALCIUM 10 MG PO TABS: ORAL_TABLET | ORAL | Status: DC

## 2015-06-05 MED ORDER — FENOFIBRATE 160 MG PO TABS: 160.0000 mg | ORAL_TABLET | Freq: Every day | ORAL | Status: DC

## 2015-06-05 MED ORDER — OLMESARTAN MEDOXOMIL 40 MG PO TABS: 40.0000 mg | ORAL_TABLET | Freq: Every day | ORAL | Status: DC

## 2015-06-05 MED ORDER — ESZOPICLONE 2 MG PO TABS: 2.0000 mg | ORAL_TABLET | Freq: Every day | ORAL | Status: DC

## 2015-06-05 NOTE — Telephone Encounter (Signed)
Rx faxed.    KP 

## 2015-06-05 NOTE — Addendum Note (Signed)
Addended by: Caffie Pinto on: 06/05/2015 05:18 PM   Modules accepted: Orders

## 2015-06-05 NOTE — Progress Notes (Signed)
[name], female    DOB: [birth year]  Age: 54 y.o. MRN: [id number]    Subjective:  Subjective HPI Sandra [name] presents for f/u bp , cholesterol and depression.   She does not like her psych and wants to switch.  She also needs refills on meds and needs her urine rechecked.   No other complaints.   Review of Systems  Constitutional: Negative for diaphoresis, appetite change, fatigue and unexpected weight change.  Eyes: Negative for pain, redness and visual disturbance.  Respiratory: Negative for cough, chest tightness, shortness of breath and wheezing.   Cardiovascular: Negative for chest pain, palpitations and leg swelling.  Endocrine: Negative for cold intolerance, heat intolerance, polydipsia, polyphagia and polyuria.  Genitourinary: Negative for dysuria, frequency and difficulty urinating.  Neurological: Negative for dizziness, light-headedness, numbness and headaches.  Psychiatric/Behavioral: Positive for sleep disturbance and dysphoric mood. Negative for suicidal ideas and self-injury. The patient is nervous/anxious.     History Past Medical History  Diagnosis Date  . PFO (patent foramen ovale) August 2012    PFO seen on TEE during hospitalization in 01/2011.  Patient to f/u with Dr. Leonie Man, neurology, for enrollment in trial for medical treatment of PFO  . CAD (coronary artery disease) 2007    istory of MI with stent in 2007 by Dr. Chancy Milroy, [facility]. Stent placement by Dr Cleatis Polka   . Prediabetes 03/07/2011  . Hypertension 03/07/2011  . Hyperlipidemia 03/07/2011  . Tobacco abuse 03/07/2011  . Depression 03/07/2011  . Psoriasis 03/07/2011  . Acute right MCA stroke ([place]) 01/27/11    Acute R MCA stroked  with L arm and leg weakness  . Anxiety 1980s    On Klonipin since age 45. Had addiction problem with Xanax which  Was therefore d/c . Seen in the past at [facility].   . Fractured toe 01/2011    History of left fifth toe proximal phalanx fracture when patient  had a stroke (01/2011) and fell. Seen by Dr Doran Durand.   . Incontinence 04/14/2011  . Arthritis 04/16/2011  . Tobacco abuse 03/07/2011    Quit 01/2011   . Fracture of fifth toe, left, closed 01/28/2011    History of left fifth toe proximal phalanx fracture when patient had a stroke (01/2011) and fell. Seen by Dr Doran Durand.    . Substance abuse     h/o narcotic abuse pt denies as of 01/09/12  . Myocardial infarct, old 2008    stents  . Blind right eye   . Pneumonia     She has past surgical history that includes cardiac stent and Eye surgery.   Her family history includes Cancer (age of onset: 9) in her sister; Diabetes in her brother, father, and mother; Heart disease in her brother, brother, father, maternal grandfather, maternal grandmother, mother, paternal grandfather, and paternal grandmother; Lung cancer in her brother.She reports that she has been smoking Cigarettes.  She has a 15 pack-year smoking history. She has never used smokeless tobacco. She reports that she does not drink alcohol or use illicit drugs.  Current Outpatient Prescriptions on File Prior to Visit  Medication Sig Dispense Refill  . albuterol (PROVENTIL HFA;VENTOLIN HFA) 108 (90 BASE) MCG/ACT inhaler Inhale 2 puffs into the lungs every 4 (four) hours as needed for wheezing or shortness of breath (or coughing). 1 Inhaler 0  . allopurinol (ZYLOPRIM) 300 MG tablet TAKE 1 TABLET BY MOUTH EVERY DAY 30 tablet 6  . atropine 1 % ophthalmic solution     .  BLACK COHOSH PO Take by mouth.    . clopidogrel (PLAVIX) 75 MG tablet TAKE 1 TABLET BY MOUTH EVERY DAY 30 tablet 6  . diclofenac (VOLTAREN) 75 MG EC tablet Take 1 tablet (75 mg total) by mouth 2 (two) times daily. Start day AFTER finishing prednisone 60 tablet 1  . LORazepam (ATIVAN) 1 MG tablet Take 1 tablet (1 mg total) by mouth at bedtime as needed for anxiety or sleep. 30 tablet 0  . oxyCODONE-acetaminophen (PERCOCET/ROXICET) 5-325 MG tablet Take 1 tablet by mouth every 6 (six)  hours as needed for severe pain. 20 tablet 0  . potassium chloride (MICRO-K) 10 MEQ CR capsule Take 2 capsules (20 mEq total) by mouth daily. 60 capsule 11  . prednisoLONE acetate (PRED FORTE) 1 % ophthalmic suspension 1 drop 4 (four) times daily.    . predniSONE (DELTASONE) 10 MG tablet 6 tabs po day 1, 5 tabs po day 2, 4 tabs po day 3, 3 tabs po day 4, 2 tabs po day 5, 1 tab po day 6 21 tablet 0  . tiZANidine (ZANAFLEX) 4 MG tablet Take 1 tablet (4 mg total) by mouth every 8 (eight) hours as needed for muscle spasms. 60 tablet 1   No current facility-administered medications on file prior to visit.     Objective:  Objective Physical Exam  Constitutional: She is oriented to person, place, and time. She appears well-developed and well-nourished.  HENT:  Head: Normocephalic and atraumatic.  Eyes: Conjunctivae and EOM are normal.  Neck: Normal range of motion. Neck supple. No JVD present. Carotid bruit is not present. No thyromegaly present.  Cardiovascular: Normal rate, regular rhythm and normal heart sounds.   No murmur heard. Pulmonary/Chest: Effort normal and breath sounds normal. No respiratory distress. She has no wheezes. She has no rales. She exhibits no tenderness.  Musculoskeletal: She exhibits no edema.  Neurological: She is alert and oriented to person, place, and time.  Psychiatric: Her speech is normal and behavior is normal. Judgment and thought content normal. Her mood appears anxious. Cognition and memory are normal. She exhibits a depressed mood.  Nursing note and vitals reviewed.  BP 124/72 mmHg  Pulse 48  Temp(Src) 98.1 F (36.7 C) (Oral)  Ht 5' 6"  (1.676 m)  Wt 201 lb (91.173 kg)  BMI 32.46 kg/m2  SpO2 98% Wt Readings from Last 3 Encounters:  06/05/15 201 lb (91.173 kg)  05/05/15 201 lb 6.4 oz (91.354 kg)  05/04/15 201 lb (91.173 kg)     Lab Results  Component Value Date   WBC 10.2 05/29/2015   HGB 13.1 05/29/2015   HCT 40.3 05/29/2015   PLT 211.0  05/29/2015   GLUCOSE 101* 05/29/2015   CHOL 125 05/29/2015   TRIG 190.0* 05/29/2015   HDL 28.80* 05/29/2015   LDLCALC 59 05/29/2015   ALT 9 05/29/2015   AST 9 05/29/2015   NA 145 05/29/2015   K 4.0 05/29/2015   CL 111 05/29/2015   CREATININE 0.54 05/29/2015   BUN 13 05/29/2015   CO2 28 05/29/2015   TSH 0.46 05/29/2015   INR 1.03 01/27/2011   HGBA1C 5.8 08/12/2013    Dg Lumbar Spine Complete  05/04/2015  CLINICAL DATA:  Status post fall approximately 10 days ago. Low back pain for a few days. Initial encounter. EXAM: LUMBAR SPINE - COMPLETE 4+ VIEW COMPARISON:  Plain films lumbar spine 12/06/2012 and 05/16/2008. FINDINGS: Vertebral body height is maintained. 0.4 cm anterolisthesis L4 on L5 is due to facet degenerative  disease and new since the prior studies. Advanced facet arthropathy is seen at L4-5 and L5-S1. Intervertebral disc space height is maintained. Aortic atherosclerosis is noted. IMPRESSION: No acute abnormality. Facet degenerative disease L4-5 and L5-S1 with 0.4 cm anterolisthesis L4 on L5 which is new since the prior exams. Electronically Signed   By: Inge Rise M.D.   On: 05/04/2015 10:42     Assessment & Plan:  Plan I have discontinued Ms. Gaugh's QUEtiapine. I have also changed her eszopiclone, amLODipine, and olmesartan. Additionally, I am having her start on Vortioxetine HBr. Lastly, I am having her maintain her prednisoLONE acetate, potassium chloride, allopurinol, clopidogrel, albuterol, atropine, LORazepam, BLACK COHOSH PO, predniSONE, tiZANidine, oxyCODONE-acetaminophen, diclofenac, atorvastatin, fenofibrate, and metoprolol.  Meds ordered this encounter  Medications  . eszopiclone (LUNESTA) 2 MG TABS tablet    Sig: Take 1 tablet (2 mg total) by mouth at bedtime.    Dispense:  30 tablet    Refill:  0  . Vortioxetine HBr (TRINTELLIX) 10 MG TABS    Sig: Take 1 tablet (10 mg total) by mouth daily.    Dispense:  30 tablet    Refill:  2  . amLODipine  (NORVASC) 5 MG tablet    Sig: Take 2 tablets (10 mg total) by mouth daily.    Dispense:  60 tablet    Refill:  6  . atorvastatin (LIPITOR) 10 MG tablet    Sig: TAKE 1 TABLET BY MOUTH EVERY DAY AT 6PM    Dispense:  30 tablet    Refill:  6  . fenofibrate 160 MG tablet    Sig: Take 1 tablet (160 mg total) by mouth daily.    Dispense:  30 tablet    Refill:  2  . metoprolol (LOPRESSOR) 100 MG tablet    Sig: Take 1 tablet (100 mg total) by mouth 2 (two) times daily.    Dispense:  60 tablet    Refill:  5  . olmesartan (BENICAR) 40 MG tablet    Sig: Take 1 tablet (40 mg total) by mouth daily.    Dispense:  30 tablet    Refill:  5    Problem List Items Addressed This Visit    Morbid obesity ([place])   Insomnia   Hyperlipidemia   Relevant Medications   amLODipine (NORVASC) 5 MG tablet   atorvastatin (LIPITOR) 10 MG tablet   fenofibrate 160 MG tablet   metoprolol (LOPRESSOR) 100 MG tablet   olmesartan (BENICAR) 40 MG tablet   Essential hypertension (Chronic)   Relevant Medications   amLODipine (NORVASC) 5 MG tablet   atorvastatin (LIPITOR) 10 MG tablet   fenofibrate 160 MG tablet   metoprolol (LOPRESSOR) 100 MG tablet   olmesartan (BENICAR) 40 MG tablet    Other Visit Diagnoses    Severe episode of recurrent major depressive disorder, without psychotic features ([place])    -  Primary    Relevant Medications    Vortioxetine HBr (TRINTELLIX) 10 MG TABS    Other Relevant Orders    Ambulatory referral to Psychology    Tobacco use disorder, continuous           Follow-up: Return in about 6 months (around 12/04/2015), or if symptoms worsen or fail to improve, for hypertension, hyperlipidemia, fasting, annual exam.  Garnet Koyanagi, DO

## 2015-06-05 NOTE — Patient Instructions (Signed)
Hypertension Hypertension, commonly called high blood pressure, is when the force of blood pumping through your arteries is too strong. Your arteries are the blood vessels that carry blood from your heart throughout your body. A blood pressure reading consists of a higher number over a lower number, such as 110/72. The higher number (systolic) is the pressure inside your arteries when your heart pumps. The lower number (diastolic) is the pressure inside your arteries when your heart relaxes. Ideally you want your blood pressure below 120/80. Hypertension forces your heart to work harder to pump blood. Your arteries may become narrow or stiff. Having untreated or uncontrolled hypertension can cause heart attack, stroke, kidney disease, and other problems. RISK FACTORS Some risk factors for high blood pressure are controllable. Others are not.  Risk factors you cannot control include:   Race. You may be at higher risk if you are African American.  Age. Risk increases with age.  Gender. Men are at higher risk than women before age 45 years. After age 65, women are at higher risk than men. Risk factors you can control include:  Not getting enough exercise or physical activity.  Being overweight.  Getting too much fat, sugar, calories, or salt in your diet.  Drinking too much alcohol. SIGNS AND SYMPTOMS Hypertension does not usually cause signs or symptoms. Extremely high blood pressure (hypertensive crisis) may cause headache, anxiety, shortness of breath, and nosebleed. DIAGNOSIS To check if you have hypertension, your health care provider will measure your blood pressure while you are seated, with your arm held at the level of your heart. It should be measured at least twice using the same arm. Certain conditions can cause a difference in blood pressure between your right and left arms. A blood pressure reading that is higher than normal on one occasion does not mean that you need treatment. If  it is not clear whether you have high blood pressure, you may be asked to return on a different day to have your blood pressure checked again. Or, you may be asked to monitor your blood pressure at home for 1 or more weeks. TREATMENT Treating high blood pressure includes making lifestyle changes and possibly taking medicine. Living a healthy lifestyle can help lower high blood pressure. You may need to change some of your habits. Lifestyle changes may include:  Following the DASH diet. This diet is high in fruits, vegetables, and whole grains. It is low in salt, red meat, and added sugars.  Keep your sodium intake below 2,300 mg per day.  Getting at least 30-45 minutes of aerobic exercise at least 4 times per week.  Losing weight if necessary.  Not smoking.  Limiting alcoholic beverages.  Learning ways to reduce stress. Your health care provider may prescribe medicine if lifestyle changes are not enough to get your blood pressure under control, and if one of the following is true:  You are 18-59 years of age and your systolic blood pressure is above 140.  You are 60 years of age or older, and your systolic blood pressure is above 150.  Your diastolic blood pressure is above 90.  You have diabetes, and your systolic blood pressure is over 140 or your diastolic blood pressure is over 90.  You have kidney disease and your blood pressure is above 140/90.  You have heart disease and your blood pressure is above 140/90. Your personal target blood pressure may vary depending on your medical conditions, your age, and other factors. HOME CARE INSTRUCTIONS    Have your blood pressure rechecked as directed by your health care provider.   Take medicines only as directed by your health care provider. Follow the directions carefully. Blood pressure medicines must be taken as prescribed. The medicine does not work as well when you skip doses. Skipping doses also puts you at risk for  problems.  Do not smoke.   Monitor your blood pressure at home as directed by your health care provider. SEEK MEDICAL CARE IF:   You think you are having a reaction to medicines taken.  You have recurrent headaches or feel dizzy.  You have swelling in your ankles.  You have trouble with your vision. SEEK IMMEDIATE MEDICAL CARE IF:  You develop a severe headache or confusion.  You have unusual weakness, numbness, or feel faint.  You have severe chest or abdominal pain.  You vomit repeatedly.  You have trouble breathing. MAKE SURE YOU:   Understand these instructions.  Will watch your condition.  Will get help right away if you are not doing well or get worse.   This information is not intended to replace advice given to you by your health care provider. Make sure you discuss any questions you have with your health care provider.   Document Released: 06/06/2005 Document Revised: 10/21/2014 Document Reviewed: 03/29/2013 Elsevier Interactive Patient Education 2016 Elsevier Inc.  

## 2015-06-05 NOTE — Progress Notes (Signed)
Pre visit review using our clinic review tool, if applicable. No additional management support is needed unless otherwise documented below in the visit note. 

## 2015-06-05 NOTE — Telephone Encounter (Signed)
Refill #30  x1 only

## 2015-06-06 LAB — URINE CULTURE

## 2015-06-16 ENCOUNTER — Telehealth: Payer: Self-pay | Admitting: Family Medicine

## 2015-06-17 MED ORDER — DIAZEPAM 5 MG PO TABS: ORAL_TABLET | ORAL | Status: DC

## 2015-06-17 NOTE — Telephone Encounter (Signed)
I will take this downstairs - it cant be sent in through the computer.

## 2015-06-19 ENCOUNTER — Other Ambulatory Visit: Payer: Self-pay | Admitting: Family Medicine

## 2015-06-20 ENCOUNTER — Ambulatory Visit (HOSPITAL_BASED_OUTPATIENT_CLINIC_OR_DEPARTMENT_OTHER)
Admission: RE | Admit: 2015-06-20 | Discharge: 2015-06-20 | Disposition: A | Discharge status: Home / Self Care | Payer: Medicare Other | Source: Ambulatory Visit | Attending: Family Medicine | Admitting: Family Medicine

## 2015-06-20 DIAGNOSIS — M545 Low back pain: Secondary | ICD-10-CM | POA: Insufficient documentation

## 2015-06-20 DIAGNOSIS — R222 Localized swelling, mass and lump, trunk: Secondary | ICD-10-CM | POA: Diagnosis not present

## 2015-06-20 DIAGNOSIS — M5137 Other intervertebral disc degeneration, lumbosacral region: Secondary | ICD-10-CM | POA: Diagnosis not present

## 2015-06-20 DIAGNOSIS — R531 Weakness: Secondary | ICD-10-CM | POA: Insufficient documentation

## 2015-06-20 DIAGNOSIS — M47896 Other spondylosis, lumbar region: Secondary | ICD-10-CM | POA: Diagnosis not present

## 2015-06-20 DIAGNOSIS — M5136 Other intervertebral disc degeneration, lumbar region: Secondary | ICD-10-CM | POA: Diagnosis not present

## 2015-06-20 DIAGNOSIS — M47817 Spondylosis without myelopathy or radiculopathy, lumbosacral region: Secondary | ICD-10-CM | POA: Diagnosis not present

## 2015-06-20 DIAGNOSIS — M5126 Other intervertebral disc displacement, lumbar region: Secondary | ICD-10-CM | POA: Diagnosis not present

## 2015-06-23 ENCOUNTER — Other Ambulatory Visit: Payer: Self-pay | Admitting: Family Medicine

## 2015-06-23 DIAGNOSIS — M5416 Radiculopathy, lumbar region: Secondary | ICD-10-CM

## 2015-06-23 NOTE — Addendum Note (Signed)
Addended by: Dene Gentry on: 06/23/2015 02:59 PM   Modules accepted: Miquel Dunn

## 2015-06-24 ENCOUNTER — Telehealth: Payer: Self-pay | Admitting: Family Medicine

## 2015-06-24 NOTE — Telephone Encounter (Signed)
Spoke to patient and told her she could do PT or go to Neuro. Patient stated that she would like to see pain management instead if possible.

## 2015-06-24 NOTE — Telephone Encounter (Signed)
Ok it's fine to refer her to pain management.

## 2015-06-25 ENCOUNTER — Other Ambulatory Visit: Payer: Self-pay | Admitting: Family Medicine

## 2015-06-25 NOTE — Telephone Encounter (Signed)
Spoke to patient and gave her information provided by physician. Will refer to pain management.

## 2015-06-26 ENCOUNTER — Telehealth: Payer: Self-pay | Admitting: Family Medicine

## 2015-06-26 DIAGNOSIS — M545 Low back pain: Secondary | ICD-10-CM

## 2015-06-26 NOTE — Telephone Encounter (Signed)
Ref placed. OK per Dr.Lowne.     KP

## 2015-06-26 NOTE — Telephone Encounter (Signed)
Patient has decided go with pain management in [place]. Dr Barbaraann Barthel has seen the patient and was requesting ESI, patient is unable to do due to medicines. Would like to go with Pain Management and Dr Barbaraann Barthel is ok with this. Per insurance, we will need to do referral; [employer].

## 2015-07-01 ENCOUNTER — Telehealth: Payer: Self-pay | Admitting: Family Medicine

## 2015-07-01 NOTE — Telephone Encounter (Signed)
Please advise 

## 2015-07-01 NOTE — Telephone Encounter (Signed)
Pharmacy: [facility] [place]  Reason for call: 1. pt called for refill on lunesta. She has 10 days or so.  2. Pt states she cannot take trintellex bc it makes her itch. 3. Pt states that she needs to go to a pain mgmt in [place]. She does not want to drive all the way to [place], or [place].

## 2015-07-02 NOTE — Telephone Encounter (Signed)
There is not a Pain Management in [place] that takes her insurance.  [facility] in [place] accepts her insurance and there is an online referral for her to see that doctor 6 times.

## 2015-07-02 NOTE — Telephone Encounter (Signed)
Would you let pt know that?

## 2015-07-02 NOTE — Telephone Encounter (Signed)
I believe she tried a lot of meds and had side effects. We probably should get her to psych---- does the psychologist she goes to have a psych in office?  Ok to refill lunesta x1  1 refill  Let jen/marj know she wants pain management in [place]

## 2015-07-03 ENCOUNTER — Telehealth: Payer: Self-pay | Admitting: Family Medicine

## 2015-07-03 NOTE — Telephone Encounter (Signed)
Last seen 06/01/15 and filled 06/01/15 #30.   Please advise   KP

## 2015-07-03 NOTE — Telephone Encounter (Signed)
Message left to call the office.    KP 

## 2015-07-03 NOTE — Telephone Encounter (Signed)
He referral has been completed per Marj, it was done when she and her husband came in today to get his Rx.    KP

## 2015-07-06 NOTE — Telephone Encounter (Signed)
Patient records were faxed with referral at [facility] in [place].  They called the patient and made the appointment and would not have done this if they had not gotten records insurance etc

## 2015-07-06 NOTE — Telephone Encounter (Addendum)
Caller name: Mr. Ozer  Relation to pt: spouse  Call back number:[phone]  Reason for call:  Spouse called sating pain management did not receive records and requesting we re fax. Spouse would like to speak with office when records are faxed

## 2015-07-20 ENCOUNTER — Telehealth: Payer: Self-pay | Admitting: Family Medicine

## 2015-07-20 NOTE — Telephone Encounter (Signed)
Relation to XH:BZJIRC  Burack,Dexter Call back Thornton:  Reason for call:  Spouse states metoprolol (LOPRESSOR) 100 MG tablet is approximately $150 and allopurinol (ZYLOPRIM) 300 MG tablet is $250 stating theyre on disability and cant afford would like to discuss

## 2015-07-20 NOTE — Telephone Encounter (Signed)
Pt called back stating the concern about cost of benicar and atorvastatin and metoprolol. They all work but she cannot afford.

## 2015-07-20 NOTE — Telephone Encounter (Signed)
Spoke with patient and the Benicar is $500 alone.

## 2015-07-21 MED ORDER — LOVASTATIN 20 MG PO TABS: 20.0000 mg | ORAL_TABLET | Freq: Every day | ORAL | Status: DC

## 2015-07-21 MED ORDER — LOSARTAN POTASSIUM 50 MG PO TABS: 50.0000 mg | ORAL_TABLET | Freq: Every day | ORAL | Status: DC

## 2015-07-21 NOTE — Telephone Encounter (Signed)
Pt said to call her back on [phone] when you have med info for her.

## 2015-07-21 NOTE — Telephone Encounter (Signed)
Change benicar to losartan 50 mg qd #30  2 refills Lipitor is generic and so are the others -----no alt for allopurinol Lovastatin 20 mg #30  1 po qhs, 2 refillls Metoprolol can be switched to bid  Instead of the qd if she is on the toprol xl---- change to metoprolol tartrate bid

## 2015-07-21 NOTE — Telephone Encounter (Signed)
Pt called back in to update phone to cb: [phone]

## 2015-07-21 NOTE — Telephone Encounter (Signed)
The medicine that they called in about yesterday will be delivered physicians pharmacy  She needs an rx for a similar med that is cheaper and called by Thursday

## 2015-07-21 NOTE — Telephone Encounter (Signed)
Patient's med's are too expensive, and they do not have a formulary. They are wanting to get alternatives to Benicar, Atorvastatin, Metoprolol and Allopurinol. Please advise    KP

## 2015-07-21 NOTE — Telephone Encounter (Signed)
Then she is already on the least expensive

## 2015-07-21 NOTE — Telephone Encounter (Signed)
I discussed with husband and he verbalized understanding of the below recommendations. Please review the med and allergy list, she is on Metoprolol Tartrate and she is allergic to Losartan.   KP

## 2015-07-22 NOTE — Telephone Encounter (Signed)
Pt 's spouse Dexter returned CMA 's call.  Requesting a call back.    CB: [phone].

## 2015-07-22 NOTE — Telephone Encounter (Signed)
Message left to call the office. Losartan cancelled at the pharmacy.   KP

## 2015-07-23 ENCOUNTER — Other Ambulatory Visit: Payer: Self-pay

## 2015-07-23 DIAGNOSIS — Z1231 Encounter for screening mammogram for malignant neoplasm of breast: Secondary | ICD-10-CM

## 2015-07-23 MED ORDER — LOSARTAN POTASSIUM 50 MG PO TABS: 50.0000 mg | ORAL_TABLET | Freq: Every day | ORAL | Status: DC

## 2015-07-23 NOTE — Addendum Note (Signed)
Addended by: Ewing Schlein on: 07/23/2015 08:48 AM   Modules accepted: Orders, Medications

## 2015-07-23 NOTE — Telephone Encounter (Signed)
Spoke with patient ad his wife and she stated she was not allergic to the Losartan and she does not know how that was put on her allergy list, she stated she had an issues with the Azor and she requested that I fax in the Losartan for her today. I made her aware I would update her allergy list and fax it in, verbal given by Dr.Lowne who stated if the patient was ok on benicar she should be fine on the Losartan. The medication has been re-faxed and I made the patient aware if she has any problems to discontinue the mediation right away, she verbalized understanding and agreed.    KP

## 2015-07-23 NOTE — Telephone Encounter (Signed)
Error

## 2015-07-24 ENCOUNTER — Other Ambulatory Visit: Payer: Self-pay | Admitting: Family Medicine

## 2015-07-27 NOTE — Telephone Encounter (Signed)
Medication filled to pharmacy as requested.   

## 2015-08-05 ENCOUNTER — Other Ambulatory Visit: Payer: Self-pay | Admitting: Family Medicine

## 2015-08-05 ENCOUNTER — Telehealth: Payer: Self-pay | Admitting: Family Medicine

## 2015-08-05 MED ORDER — ESZOPICLONE 2 MG PO TABS
2.0000 mg | ORAL_TABLET | Freq: Every evening | ORAL | Status: DC | PRN
Start: 2015-08-05 — End: 2017-07-28

## 2015-08-05 NOTE — Telephone Encounter (Signed)
See rx. 

## 2015-08-05 NOTE — Telephone Encounter (Signed)
Telephone note, 08/05/2015 sent to Dr. Etter Sjogren for med refill approval.

## 2015-08-05 NOTE — Telephone Encounter (Signed)
Pt is requesting refill on Lunesta.  Last OV: 06/05/2015 Last Fill: 07/03/2015 #30 and 0RF   Okay to refill?

## 2015-08-05 NOTE — Telephone Encounter (Signed)
Rx faxed to pharmacy  

## 2015-08-05 NOTE — Telephone Encounter (Signed)
Relation to UK:GURK Call back [place] [address] [phone] (Phone) [phone] (Fax)         Reason for call:  Patient requesting a refill leszopiclone (LUNESTA) 2 MG TABS tablet

## 2015-08-05 NOTE — Telephone Encounter (Signed)
Last OV 06/05/15 lunesta last filled 07/03/15 #30 with 0

## 2015-08-07 ENCOUNTER — Other Ambulatory Visit: Payer: Self-pay | Admitting: Family Medicine

## 2015-08-07 DIAGNOSIS — F410 Panic disorder [episodic paroxysmal anxiety] without agoraphobia: Secondary | ICD-10-CM

## 2015-08-07 DIAGNOSIS — E785 Hyperlipidemia, unspecified: Secondary | ICD-10-CM

## 2015-08-07 DIAGNOSIS — F332 Major depressive disorder, recurrent severe without psychotic features: Secondary | ICD-10-CM

## 2015-08-07 DIAGNOSIS — G4700 Insomnia, unspecified: Secondary | ICD-10-CM

## 2015-08-07 DIAGNOSIS — F411 Generalized anxiety disorder: Secondary | ICD-10-CM

## 2015-08-07 MED ORDER — ALLOPURINOL 300 MG PO TABS: 300.0000 mg | ORAL_TABLET | Freq: Every day | ORAL | Status: DC

## 2015-08-07 MED ORDER — CLOPIDOGREL BISULFATE 75 MG PO TABS: 75.0000 mg | ORAL_TABLET | Freq: Every day | ORAL | Status: DC

## 2015-08-07 MED ORDER — METOPROLOL TARTRATE 100 MG PO TABS: 100.0000 mg | ORAL_TABLET | Freq: Two times a day (BID) | ORAL | Status: DC

## 2015-08-07 MED ORDER — LOSARTAN POTASSIUM 50 MG PO TABS: 50.0000 mg | ORAL_TABLET | Freq: Every day | ORAL | Status: DC

## 2015-08-07 MED ORDER — LORAZEPAM 1 MG PO TABS: 1.0000 mg | ORAL_TABLET | Freq: Every evening | ORAL | Status: DC | PRN

## 2015-08-07 MED ORDER — LOVASTATIN 20 MG PO TABS: 20.0000 mg | ORAL_TABLET | Freq: Every day | ORAL | Status: DC

## 2015-08-07 MED ORDER — FENOFIBRATE 160 MG PO TABS: 160.0000 mg | ORAL_TABLET | Freq: Every day | ORAL | Status: DC

## 2015-08-07 MED ORDER — AMLODIPINE BESYLATE 5 MG PO TABS: 10.0000 mg | ORAL_TABLET | Freq: Every day | ORAL | Status: DC

## 2015-08-07 NOTE — Telephone Encounter (Signed)
Husband has been made aware that al med;s have been faxed.    KP

## 2015-08-07 NOTE — Telephone Encounter (Signed)
Ok to send lorazepam #90 to mail order.  No refills

## 2015-08-07 NOTE — Telephone Encounter (Signed)
Pt husband called to state they are changing pharmacy to [facility]. They are wanting all new RXs sent to [facility] for refills starting in March. Husband states that [facility] already faxed a request for the new RXs. It is less expensive for them. Pt and husband request a call to notify if we have the fax from [facility].

## 2015-08-07 NOTE — Telephone Encounter (Signed)
Patient has to have all of her med's sent to [facility], she is requesting the Lorazepam as well. She said she had to stop the Trintillex because it was causing her to itch.  Lorazepam last filled 03/27/15. Please advise   KP

## 2015-08-11 DIAGNOSIS — Z79899 Other long term (current) drug therapy: Secondary | ICD-10-CM | POA: Diagnosis not present

## 2015-08-11 DIAGNOSIS — M47816 Spondylosis without myelopathy or radiculopathy, lumbar region: Secondary | ICD-10-CM | POA: Diagnosis not present

## 2015-08-11 DIAGNOSIS — M5136 Other intervertebral disc degeneration, lumbar region: Secondary | ICD-10-CM | POA: Diagnosis not present

## 2015-08-11 DIAGNOSIS — M5416 Radiculopathy, lumbar region: Secondary | ICD-10-CM | POA: Diagnosis not present

## 2015-08-11 DIAGNOSIS — G8929 Other chronic pain: Secondary | ICD-10-CM | POA: Diagnosis not present

## 2015-08-17 ENCOUNTER — Other Ambulatory Visit: Payer: Self-pay | Admitting: Family Medicine

## 2015-08-20 ENCOUNTER — Other Ambulatory Visit: Payer: Self-pay | Admitting: Internal Medicine

## 2015-08-26 ENCOUNTER — Telehealth: Payer: Self-pay

## 2015-08-26 DIAGNOSIS — R32 Unspecified urinary incontinence: Secondary | ICD-10-CM

## 2015-08-26 NOTE — Telephone Encounter (Signed)
Referral placed. Pt notified.  

## 2015-08-26 NOTE — Telephone Encounter (Signed)
Spoke with patient and she stated she is having issues with Using the bathroom all over herself, she said she can not control the bladder and wanted to get a bladder tack or discuss options. I advised she would need to see a specialist and she agreed to do so. Please advise    KP

## 2015-08-26 NOTE — Telephone Encounter (Signed)
Refer to urology

## 2015-08-27 DIAGNOSIS — H02401 Unspecified ptosis of right eyelid: Secondary | ICD-10-CM | POA: Diagnosis not present

## 2015-08-27 DIAGNOSIS — Z7951 Long term (current) use of inhaled steroids: Secondary | ICD-10-CM | POA: Diagnosis not present

## 2015-08-27 DIAGNOSIS — H268 Other specified cataract: Secondary | ICD-10-CM | POA: Diagnosis not present

## 2015-08-27 DIAGNOSIS — H40052 Ocular hypertension, left eye: Secondary | ICD-10-CM | POA: Diagnosis not present

## 2015-08-27 DIAGNOSIS — H2512 Age-related nuclear cataract, left eye: Secondary | ICD-10-CM | POA: Diagnosis not present

## 2015-08-27 DIAGNOSIS — H5340 Unspecified visual field defects: Secondary | ICD-10-CM | POA: Diagnosis not present

## 2015-08-27 DIAGNOSIS — F17210 Nicotine dependence, cigarettes, uncomplicated: Secondary | ICD-10-CM | POA: Diagnosis not present

## 2015-08-27 DIAGNOSIS — H5441 Blindness, right eye, normal vision left eye: Secondary | ICD-10-CM | POA: Diagnosis not present

## 2015-08-27 DIAGNOSIS — H44003 Unspecified purulent endophthalmitis, bilateral: Secondary | ICD-10-CM | POA: Diagnosis not present

## 2015-08-31 ENCOUNTER — Encounter: Payer: Self-pay | Admitting: Family Medicine

## 2015-08-31 ENCOUNTER — Ambulatory Visit
Admission: RE | Admit: 2015-08-31 | Discharge: 2015-08-31 | Disposition: A | Discharge status: Home / Self Care | Payer: Commercial Managed Care - HMO | Source: Ambulatory Visit

## 2015-08-31 DIAGNOSIS — Z1231 Encounter for screening mammogram for malignant neoplasm of breast: Secondary | ICD-10-CM

## 2015-09-11 ENCOUNTER — Other Ambulatory Visit: Payer: Self-pay | Admitting: Family Medicine

## 2015-09-11 NOTE — Telephone Encounter (Signed)
Advise do not see where PCP has filled before.

## 2015-09-15 DIAGNOSIS — H5340 Unspecified visual field defects: Secondary | ICD-10-CM | POA: Diagnosis not present

## 2015-09-15 DIAGNOSIS — H4489 Other disorders of globe: Secondary | ICD-10-CM | POA: Diagnosis not present

## 2015-09-15 DIAGNOSIS — G9389 Other specified disorders of brain: Secondary | ICD-10-CM | POA: Diagnosis not present

## 2015-09-18 ENCOUNTER — Other Ambulatory Visit: Payer: Self-pay | Admitting: Family Medicine

## 2015-09-18 ENCOUNTER — Other Ambulatory Visit: Payer: Self-pay | Admitting: Neurology

## 2015-09-21 ENCOUNTER — Other Ambulatory Visit: Payer: Self-pay | Admitting: Family Medicine

## 2015-09-22 NOTE — Telephone Encounter (Signed)
Last seen 06/05/15 and filled 05/05/15 by Blase Mess MD.   Please advise     KP

## 2015-10-05 ENCOUNTER — Telehealth: Payer: Self-pay | Admitting: Family Medicine

## 2015-10-05 DIAGNOSIS — F410 Panic disorder [episodic paroxysmal anxiety] without agoraphobia: Secondary | ICD-10-CM

## 2015-10-05 DIAGNOSIS — G4700 Insomnia, unspecified: Secondary | ICD-10-CM

## 2015-10-05 DIAGNOSIS — F411 Generalized anxiety disorder: Secondary | ICD-10-CM

## 2015-10-05 MED ORDER — LORAZEPAM 1 MG PO TABS: 1.0000 mg | ORAL_TABLET | Freq: Every evening | ORAL | Status: DC | PRN

## 2015-10-05 NOTE — Telephone Encounter (Signed)
Rx faxed.    KP 

## 2015-10-05 NOTE — Telephone Encounter (Signed)
Rx printed. To provider for signature.

## 2015-10-05 NOTE — Telephone Encounter (Signed)
Caller name:Sicily  Relationship to patient: Can be reached: Pharmacy [facility] mail order :  Reason for call:Please send Lorazepam refill to [facility] mail order

## 2015-10-05 NOTE — Telephone Encounter (Signed)
Refill x1 

## 2015-10-05 NOTE — Telephone Encounter (Signed)
Last seen 06/05/15 and filled 08/07/15 #90 Rx need to be faxed to [facility]   Please advise    KP

## 2015-10-20 ENCOUNTER — Other Ambulatory Visit: Payer: Self-pay | Admitting: Family Medicine

## 2015-11-24 ENCOUNTER — Other Ambulatory Visit: Payer: Self-pay | Admitting: Family Medicine

## 2015-11-24 NOTE — Telephone Encounter (Signed)
Last seen 06/05/15 and filled 10/04/25 #90   Please advise     KP

## 2015-11-24 NOTE — Telephone Encounter (Signed)
Rx faxed.    KP 

## 2015-11-25 ENCOUNTER — Telehealth: Payer: Self-pay | Admitting: Family Medicine

## 2015-11-25 NOTE — Telephone Encounter (Signed)
°  Relationship to patient: Self  Can be reached: [phone]   Pharmacy:  [facility] [phone] ([phone] (Fax)        Reason for call: Refill LORazepam (ATIVAN) 1 MG tablet [[id number]]

## 2015-11-26 NOTE — Telephone Encounter (Signed)
Previously faxed to mail order. I will fax to the local pharmacy.   KP

## 2015-12-07 ENCOUNTER — Encounter: Payer: Medicare Other | Admitting: Family Medicine

## 2015-12-18 ENCOUNTER — Telehealth: Payer: Self-pay | Admitting: Family Medicine

## 2015-12-18 DIAGNOSIS — L608 Other nail disorders: Secondary | ICD-10-CM

## 2015-12-18 NOTE — Telephone Encounter (Signed)
Patient is requesting the same podiatrist that her husband saw.(Sandra [name], DPM) Ref has been place for toenail deformity causing pain.    KP

## 2015-12-18 NOTE — Telephone Encounter (Signed)
Relation to NP:MVAE Call back [place]:  Reason for call:  Patient states psoriasis is affecting her toenail and would like to be referred to a orthropedic. Please advise

## 2015-12-18 NOTE — Telephone Encounter (Signed)
Please advise      KP 

## 2015-12-18 NOTE — Telephone Encounter (Signed)
Agree 

## 2015-12-18 NOTE — Telephone Encounter (Signed)
Normally toenail problems go to podiatry or derm--- what is problem with nail?

## 2015-12-25 ENCOUNTER — Encounter: Payer: Self-pay | Admitting: Podiatry

## 2015-12-25 ENCOUNTER — Ambulatory Visit (INDEPENDENT_AMBULATORY_CARE_PROVIDER_SITE_OTHER): Payer: Commercial Managed Care - HMO | Admitting: Podiatry

## 2015-12-25 VITALS — BP 122/69 | HR 61 | Ht 65.5 in | Wt 200.0 lb

## 2015-12-25 DIAGNOSIS — L03032 Cellulitis of left toe: Secondary | ICD-10-CM | POA: Diagnosis not present

## 2015-12-25 DIAGNOSIS — M79673 Pain in unspecified foot: Secondary | ICD-10-CM | POA: Diagnosis not present

## 2015-12-25 DIAGNOSIS — L600 Ingrowing nail: Secondary | ICD-10-CM | POA: Diagnosis not present

## 2015-12-25 DIAGNOSIS — B351 Tinea unguium: Secondary | ICD-10-CM

## 2015-12-25 DIAGNOSIS — M79606 Pain in leg, unspecified: Secondary | ICD-10-CM

## 2015-12-25 DIAGNOSIS — L03012 Cellulitis of left finger: Secondary | ICD-10-CM

## 2015-12-25 NOTE — Progress Notes (Signed)
SUBJECTIVE: 55 y.o. year old female presents with painful thick nails and ingrown nail on both great toes.    REVIEW OF SYSTEMS:  Psoriasis since age 64, Heart attack 14 years ago, stroke 6 years ago, Blinded right eye 2015 May.    OBJECTIVE: DERMATOLOGIC EXAMINATION: Nails: Severe discolored and deformed nails x 10. Symptomatic inflamed ingrown nail on left great toe medial border.  VASCULAR EXAMINATION OF LOWER LIMBS: Pedal pulses: All pedal pulses are palpable with normal pulsation.  Temperature gradient from tibial crest to dorsum of foot is within normal bilateral.  NEUROLOGIC EXAMINATION OF THE LOWER LIMBS: All epicritic and tactile sensations are grossly intact.   MUSCULOSKELETAL EXAMINATION: No gross deformities noted.  ASSESSMENT: Onychomycosis x 10. Onychocryptosis both great toe with paronychia left great toe. Pain in lower limbs.  PLAN: Reviewed clinical findings and available treatment options. All nails debrided.  As per request, ingrown nail surgery done on left great toe medial border as follow. Affected toe was anesthetized with total 36ml mixture of 50/50 0.5% Marcaine plain and 1% Xylocaine plain. Affected nail border was reflected with a nail elevator and excised with nail nipper. Proximal nail matrix tissue was cauterized with Phenol soaked cotton applicator x 4 and neutralized with Alcohol soaked cotton applicator. The wound was dressed with Amerigel ointment dressing. Home care instructions and supply dispensed.  Return in 1 week for follow up.

## 2015-12-25 NOTE — Patient Instructions (Signed)
Ingrown nail surgery was done. Follow soaking instruction.  Some redness and drainage is expected. Call the office if the area gets feverish with increased redness and drainage.

## 2015-12-31 ENCOUNTER — Ambulatory Visit (INDEPENDENT_AMBULATORY_CARE_PROVIDER_SITE_OTHER): Payer: Commercial Managed Care - HMO | Admitting: Podiatry

## 2015-12-31 ENCOUNTER — Encounter: Payer: Self-pay | Admitting: Podiatry

## 2015-12-31 ENCOUNTER — Telehealth: Payer: Self-pay | Admitting: Family Medicine

## 2015-12-31 VITALS — BP 115/65 | HR 55

## 2015-12-31 DIAGNOSIS — M545 Low back pain: Secondary | ICD-10-CM

## 2015-12-31 DIAGNOSIS — Z9889 Other specified postprocedural states: Secondary | ICD-10-CM

## 2015-12-31 MED ORDER — OXYCODONE-ACETAMINOPHEN 7.5-325 MG PO TABS: 1.0000 | ORAL_TABLET | Freq: Four times a day (QID) | ORAL | Status: DC | PRN

## 2015-12-31 MED ORDER — AMOXICILLIN 500 MG PO CAPS: 500.0000 mg | ORAL_CAPSULE | Freq: Three times a day (TID) | ORAL | Status: DC

## 2015-12-31 NOTE — Telephone Encounter (Signed)
Ref placed.      KP 

## 2015-12-31 NOTE — Telephone Encounter (Signed)
Please advise      KP 

## 2015-12-31 NOTE — Telephone Encounter (Signed)
Ok for referral?

## 2015-12-31 NOTE — Patient Instructions (Signed)
Post op wound healing slow with some pain. Antibiotic and pain medication prescribed. Return for routine foot care in 3 months. If there is any problem with surgical site, return as needed.

## 2015-12-31 NOTE — Telephone Encounter (Signed)
Caller name: Sunny Relation to pt: self  Call back number: [phone] Pharmacy:  Reason for call: Pt stated was approved to see Pain Management in [place] a few months ago and pt states lost her medicaid insurance coverage, pt would like to be referred again if possible to a Pain Management Doctor but not in [place]. Please advise.

## 2015-12-31 NOTE — Progress Notes (Signed)
SUBJECTIVE: 55 y.o. year old female presents for follow up on nail surgery left great toe medial border. Surgical site is clean and moist. Mild inflamed ungual labia. Pain at the surgical site.   Area cleansed with Iodine and wet to dry dressing applied. Antibiotic and pain medication prescribed. Return for routine foot care or sooner if problem arise.

## 2016-01-12 ENCOUNTER — Other Ambulatory Visit: Payer: Self-pay | Admitting: Family Medicine

## 2016-01-15 ENCOUNTER — Other Ambulatory Visit: Payer: Self-pay | Admitting: Family Medicine

## 2016-01-18 NOTE — Telephone Encounter (Signed)
Lats seen 06/05/15 and filled 05/05/15 #60 with 1 rf   Please advise    KP

## 2016-01-26 ENCOUNTER — Other Ambulatory Visit: Payer: Self-pay | Admitting: Family Medicine

## 2016-01-26 DIAGNOSIS — E785 Hyperlipidemia, unspecified: Secondary | ICD-10-CM

## 2016-02-15 ENCOUNTER — Telehealth: Payer: Self-pay | Admitting: Family Medicine

## 2016-02-15 NOTE — Telephone Encounter (Signed)
Contacted patient, patient states she needs the MRI report faxed to the # below.

## 2016-02-15 NOTE — Telephone Encounter (Signed)
Relation to CQ:FJUV Call back number:[phone]   Reason for call:  Spouse requesting MRI orders fax to [phone].

## 2016-02-15 NOTE — Telephone Encounter (Signed)
Order for what??

## 2016-02-16 NOTE — Telephone Encounter (Signed)
Patient informed. 

## 2016-02-16 NOTE — Telephone Encounter (Signed)
We did not do the MRI, she will need to have [facility] office send it.    KP

## 2016-02-18 ENCOUNTER — Other Ambulatory Visit: Payer: Self-pay | Admitting: Family Medicine

## 2016-02-20 ENCOUNTER — Encounter (HOSPITAL_BASED_OUTPATIENT_CLINIC_OR_DEPARTMENT_OTHER): Payer: Self-pay | Admitting: *Deleted

## 2016-02-20 ENCOUNTER — Emergency Department (HOSPITAL_BASED_OUTPATIENT_CLINIC_OR_DEPARTMENT_OTHER)
Admission: EM | Admit: 2016-02-20 | Discharge: 2016-02-20 | Disposition: A | Discharge status: Home / Self Care | Payer: Commercial Managed Care - HMO | Attending: Emergency Medicine | Admitting: Emergency Medicine

## 2016-02-20 DIAGNOSIS — M545 Low back pain, unspecified: Secondary | ICD-10-CM

## 2016-02-20 DIAGNOSIS — F17210 Nicotine dependence, cigarettes, uncomplicated: Secondary | ICD-10-CM | POA: Insufficient documentation

## 2016-02-20 DIAGNOSIS — W010XXA Fall on same level from slipping, tripping and stumbling without subsequent striking against object, initial encounter: Secondary | ICD-10-CM | POA: Diagnosis not present

## 2016-02-20 DIAGNOSIS — Z79899 Other long term (current) drug therapy: Secondary | ICD-10-CM | POA: Diagnosis not present

## 2016-02-20 DIAGNOSIS — Y999 Unspecified external cause status: Secondary | ICD-10-CM | POA: Insufficient documentation

## 2016-02-20 DIAGNOSIS — I2510 Atherosclerotic heart disease of native coronary artery without angina pectoris: Secondary | ICD-10-CM | POA: Insufficient documentation

## 2016-02-20 DIAGNOSIS — Y929 Unspecified place or not applicable: Secondary | ICD-10-CM | POA: Insufficient documentation

## 2016-02-20 DIAGNOSIS — I10 Essential (primary) hypertension: Secondary | ICD-10-CM | POA: Diagnosis not present

## 2016-02-20 DIAGNOSIS — W19XXXA Unspecified fall, initial encounter: Secondary | ICD-10-CM

## 2016-02-20 DIAGNOSIS — Y939 Activity, unspecified: Secondary | ICD-10-CM | POA: Insufficient documentation

## 2016-02-20 MED ORDER — KETOROLAC TROMETHAMINE 60 MG/2ML IM SOLN
60.0000 mg | Freq: Once | INTRAMUSCULAR | Status: AC
  Administered 2016-02-20: 60 mg via INTRAMUSCULAR
  Filled 2016-02-20: qty 2

## 2016-02-20 MED ORDER — DICLOFENAC SODIUM 75 MG PO TBEC: 75.0000 mg | DELAYED_RELEASE_TABLET | Freq: Two times a day (BID) | ORAL | 0 refills | Status: DC

## 2016-02-20 NOTE — Discharge Instructions (Signed)

## 2016-02-20 NOTE — ED Provider Notes (Signed)
[place] DEPT [facility] Provider Note   CSN: [id number] Arrival date & time: 02/20/16  1018     History   Chief Complaint Chief Complaint  Patient presents with  . Fall    HPI Sandra [name] is a 55 y.o. female.  The pt states that she tripped over boxes in the [employer] store and fell forward and rolled onto her back - had developed back pain last night with some pain in the buttock and the leg - able to ambulate.  No numbness.   Pain is constant, no meds prior to arrival.  Moore and stabbing pain.  Occurred yesterday - has residual burning paresthesias from prior stroke in the past      Past Medical History:  Diagnosis Date  . Acute right MCA stroke ([place]) 01/27/11   Acute R MCA stroked  with L arm and leg weakness  . Anxiety 1980s   On Klonipin since age 89. Had addiction problem with Xanax which  Was therefore d/c . Seen in the past at [facility].   . Arthritis 04/16/2011  . Blind right eye   . CAD (coronary artery disease) 2007   istory of MI with stent in 2007 by Dr. Chancy Milroy, [facility]. Stent placement by Dr Cleatis Polka   . Depression 03/07/2011  . Fracture of fifth toe, left, closed 01/28/2011   History of left fifth toe proximal phalanx fracture when patient had a stroke (01/2011) and fell. Seen by Dr Doran Durand.    . Fractured toe 01/2011   History of left fifth toe proximal phalanx fracture when patient had a stroke (01/2011) and fell. Seen by Dr Doran Durand.   . Hyperlipidemia 03/07/2011  . Hypertension 03/07/2011  . Incontinence 04/14/2011  . Myocardial infarct, old 2008   stents  . PFO (patent foramen ovale) August 2012   PFO seen on TEE during hospitalization in 01/2011.  Patient to f/u with Dr. Leonie Man, neurology, for enrollment in trial for medical treatment of PFO  . Pneumonia   . Prediabetes 03/07/2011  . Psoriasis 03/07/2011  . Substance abuse    h/o narcotic abuse pt denies as of 01/09/12  . Tobacco abuse 03/07/2011  . Tobacco abuse 03/07/2011   Quit 01/2011      Patient Active Problem List   Diagnosis Date Noted  . Morbid obesity ([place]) 06/05/2015  . Spastic hemiplegia affecting nondominant side ([place]) 04/02/2015  . Hypokalemia 12/23/2013  . Cold intolerance 11/25/2013  . Recurrent pneumonia 11/06/2013  . MRSA bacteremia 11/06/2013  . Bilateral endophthalmitis 11/06/2013  . Metabolic syndrome 12/18/1599  . Routine adult health maintenance 08/13/2013  . Dyspareunia, female 02/06/2013  . Lumbar and sacral osteoarthritis 12/06/2012  . Diarrhea 11/15/2012  . Insomnia 07/23/2012  . HIV exposure 06/27/2012  . Gout 01/31/2012  . Benzodiazepine dependence, continuous ([place]) 01/26/2012    Class: Chronic  . Hot flashes 01/09/2012  . Tobacco abuse 01/09/2012  . Arthritis 04/16/2011  . Anxiety 04/14/2011  . Incontinence 04/14/2011  . PFO (patent foramen ovale) 03/07/2011  . CAD (coronary artery disease) 03/07/2011  . Essential hypertension 03/07/2011  . Hyperlipidemia 03/07/2011  . Depression 03/07/2011  . Psoriasis 03/07/2011  . H/O: CVA (cerebrovascular accident) 01/27/2011    Past Surgical History:  Procedure Laterality Date  . cardiac stent     2008  . EYE SURGERY      OB History    No data available       Home Medications    Prior to Admission medications   Medication Sig  Start Date End Date Taking? Authorizing Provider  allopurinol (ZYLOPRIM) 300 MG tablet TAKE 1 TABLET BY MOUTH EVERY DAY 10/21/15  Yes Yvonne R Lowne Chase, DO  amLODipine (NORVASC) 5 MG tablet Take 2 tablets (10 mg total) by mouth daily. 08/07/15  Yes Rosalita Chessman Chase, DO  atropine 1 % ophthalmic solution  03/20/15  Yes Historical Provider, MD  clopidogrel (PLAVIX) 75 MG tablet Take 1 tablet (75 mg total) by mouth daily. 08/07/15  Yes Yvonne R Lowne Chase, DO  eszopiclone (LUNESTA) 2 MG TABS tablet Take 1 tablet (2 mg total) by mouth at bedtime as needed. for sleep 08/05/15  Yes Debbrah Alar, NP  fenofibrate 160 MG tablet TAKE 1 TABLET EVERY DAY 01/26/16   Yes Yvonne R Lowne Chase, DO  LORazepam (ATIVAN) 1 MG tablet TAKE 1 TABLET AT BEDTIME AS NEEDED FOR ANXIETY  OR FOR SLEEP 11/24/15  Yes Yvonne R Lowne Chase, DO  lovastatin (MEVACOR) 20 MG tablet Take 1 tablet (20 mg total) by mouth at bedtime. 08/07/15  Yes Yvonne R Lowne Chase, DO  metoprolol (LOPRESSOR) 100 MG tablet TAKE 1 TABLET TWICE DAILY 01/26/16  Yes Yvonne R Lowne Chase, DO  potassium chloride (MICRO-K) 10 MEQ CR capsule TAKE 2 CAPSULES BY MOUTH DAILY 01/13/16  Yes Yvonne R Lowne Chase, DO  prednisoLONE acetate (PRED FORTE) 1 % ophthalmic suspension 1 drop 4 (four) times daily.   Yes Historical Provider, MD  tiZANidine (ZANAFLEX) 4 MG tablet Take 1 tablet (4 mg total) by mouth every 8 (eight) hours as needed for muscle spasms. 05/05/15  Yes Dene Gentry, MD  albuterol (PROVENTIL HFA;VENTOLIN HFA) 108 (90 BASE) MCG/ACT inhaler Inhale 2 puffs into the lungs every 4 (four) hours as needed for wheezing or shortness of breath (or coughing). 1/61/09   Delora Fuel, MD  diclofenac (VOLTAREN) 75 MG EC tablet Take 1 tablet (75 mg total) by mouth 2 (two) times daily. 02/20/16   Noemi Chapel, MD    Family History Family History  Problem Relation Age of Onset  . Heart disease Mother   . Diabetes Mother   . Heart disease Father   . Diabetes Father   . Cancer Sister 71    ovarian  . Heart disease Brother   . Diabetes Brother   . Heart disease Maternal Grandmother   . Heart disease Maternal Grandfather   . Heart disease Paternal Grandmother   . Heart disease Paternal Grandfather   . Lung cancer Brother   . Heart disease Brother     Social History Social History  Substance Use Topics  . Smoking status: Current Every Day Smoker    Packs/day: 0.50    Years: 30.00    Types: Cigarettes    Last attempt to quit: 04/07/2014  . Smokeless tobacco: Never Used     Comment: Trying to quit again.  . Alcohol use No     Allergies   Azor [amlodipine-olmesartan] and Trintellix  [vortioxetine]   Review of Systems Review of Systems  Musculoskeletal: Positive for back pain.  Neurological: Negative for weakness and numbness.     Physical Exam Updated Vital Signs BP 141/77 (BP Location: Right Arm)   Pulse (!) 58   Temp 97.9 F (36.6 C) (Oral)   Resp 22   Ht 5' 5"  (1.651 m)   Wt 189 lb (85.7 kg)   SpO2 99%   BMI 31.45 kg/m   Physical Exam  Constitutional: She appears well-developed and well-nourished.  HENT:  Head: Normocephalic and  atraumatic.  Eyes: Conjunctivae are normal. Right eye exhibits no discharge. Left eye exhibits no discharge.  Pulmonary/Chest: Effort normal. No respiratory distress.  Musculoskeletal: She exhibits tenderness ( ttp over the R lower back and buttock, no midline ttp).  Neurological: She is alert. Coordination normal.  No weakness / numbness - able to straight leg raise bilaterally  Skin: Skin is warm and dry. No rash noted. She is not diaphoretic. No erythema.  Psychiatric: She has a normal mood and affect.  Nursing note and vitals reviewed.    ED Treatments / Results  Labs (all labs ordered are listed, but only abnormal results are displayed) Labs Reviewed - No data to display   Radiology No results found.  Procedures Procedures (including critical care time)  Medications Ordered in ED Medications  ketorolac (TORADOL) injection 60 mg (not administered)     Initial Impression / Assessment and Plan / ED Course  I have reviewed the triage vital signs and the nursing notes.  Pertinent labs & imaging results that were available during my care of the patient were reviewed by me and considered in my medical decision making (see chart for details).  Clinical Course   Reviewed medical history as well as the [place] controlled substance reporting system, she has had 7.5 mg oxycodone acetaminophen tablets, 60, filled 6 weeks ago. I will give her Toradol, she will be prescribed anti-inflammatories, no need for  imaging, no direct impact, no neuro symptoms  Final Clinical Impressions(s) / ED Diagnoses   Final diagnoses:  Fall, initial encounter  Right-sided low back pain without sciatica    New Prescriptions New Prescriptions   DICLOFENAC (VOLTAREN) 75 MG EC TABLET    Take 1 tablet (75 mg total) by mouth 2 (two) times daily.     Noemi Chapel, MD 02/20/16 1055

## 2016-02-20 NOTE — ED Notes (Signed)
Pt made aware to return if symptoms worsen or if any life threatening symptoms occur.   

## 2016-02-20 NOTE — ED Notes (Signed)
MD at bedside. 

## 2016-02-20 NOTE — ED Triage Notes (Signed)
Pt reports falling yesterday afternoon when she tripped over a box in a store. Pt reports she fell forward onto the box then rolled over onto her back. Presents today with lower back pain. Denies hitting head, other known injuries. Denies fever, n/v/d, genitourinary symptoms.

## 2016-02-21 DIAGNOSIS — S3992XA Unspecified injury of lower back, initial encounter: Secondary | ICD-10-CM | POA: Diagnosis not present

## 2016-02-21 DIAGNOSIS — Z780 Asymptomatic menopausal state: Secondary | ICD-10-CM | POA: Diagnosis not present

## 2016-02-21 DIAGNOSIS — M545 Low back pain: Secondary | ICD-10-CM | POA: Diagnosis not present

## 2016-02-21 DIAGNOSIS — Z955 Presence of coronary angioplasty implant and graft: Secondary | ICD-10-CM | POA: Diagnosis not present

## 2016-02-21 DIAGNOSIS — S32019A Unspecified fracture of first lumbar vertebra, initial encounter for closed fracture: Secondary | ICD-10-CM | POA: Diagnosis not present

## 2016-02-21 DIAGNOSIS — I252 Old myocardial infarction: Secondary | ICD-10-CM | POA: Diagnosis not present

## 2016-02-21 DIAGNOSIS — F17210 Nicotine dependence, cigarettes, uncomplicated: Secondary | ICD-10-CM | POA: Diagnosis not present

## 2016-02-21 DIAGNOSIS — M4316 Spondylolisthesis, lumbar region: Secondary | ICD-10-CM | POA: Diagnosis not present

## 2016-02-21 DIAGNOSIS — H5441 Blindness, right eye, normal vision left eye: Secondary | ICD-10-CM | POA: Diagnosis not present

## 2016-02-21 DIAGNOSIS — Z8673 Personal history of transient ischemic attack (TIA), and cerebral infarction without residual deficits: Secondary | ICD-10-CM | POA: Diagnosis not present

## 2016-02-21 DIAGNOSIS — I10 Essential (primary) hypertension: Secondary | ICD-10-CM | POA: Diagnosis not present

## 2016-02-25 ENCOUNTER — Ambulatory Visit (INDEPENDENT_AMBULATORY_CARE_PROVIDER_SITE_OTHER): Payer: Commercial Managed Care - HMO | Admitting: Family Medicine

## 2016-02-25 ENCOUNTER — Encounter: Payer: Self-pay | Admitting: Family Medicine

## 2016-02-25 VITALS — BP 183/86 | HR 52 | Temp 98.3°F | Resp 17 | Ht 65.0 in | Wt 191.6 lb

## 2016-02-25 DIAGNOSIS — E785 Hyperlipidemia, unspecified: Secondary | ICD-10-CM

## 2016-02-25 DIAGNOSIS — R7989 Other specified abnormal findings of blood chemistry: Secondary | ICD-10-CM

## 2016-02-25 DIAGNOSIS — Z23 Encounter for immunization: Secondary | ICD-10-CM | POA: Diagnosis not present

## 2016-02-25 DIAGNOSIS — I10 Essential (primary) hypertension: Secondary | ICD-10-CM

## 2016-02-25 DIAGNOSIS — M1000 Idiopathic gout, unspecified site: Secondary | ICD-10-CM

## 2016-02-25 DIAGNOSIS — F17210 Nicotine dependence, cigarettes, uncomplicated: Secondary | ICD-10-CM

## 2016-02-25 DIAGNOSIS — Z0000 Encounter for general adult medical examination without abnormal findings: Secondary | ICD-10-CM

## 2016-02-25 DIAGNOSIS — F419 Anxiety disorder, unspecified: Secondary | ICD-10-CM

## 2016-02-25 DIAGNOSIS — S32010A Wedge compression fracture of first lumbar vertebra, initial encounter for closed fracture: Secondary | ICD-10-CM | POA: Diagnosis not present

## 2016-02-25 DIAGNOSIS — Z8673 Personal history of transient ischemic attack (TIA), and cerebral infarction without residual deficits: Secondary | ICD-10-CM

## 2016-02-25 LAB — POCT URINALYSIS DIPSTICK
Glucose, UA: NEGATIVE
Ketones, UA: NEGATIVE
Leukocytes, UA: NEGATIVE
Nitrite, UA: NEGATIVE
PROTEIN UA: NEGATIVE
RBC UA: NEGATIVE
UROBILINOGEN UA: 0.2
pH, UA: 5.5

## 2016-02-25 MED ORDER — NICOTINE 10 MG IN INHA: 1.0000 | RESPIRATORY_TRACT | 0 refills | Status: DC | PRN

## 2016-02-25 MED ORDER — LORAZEPAM 1 MG PO TABS: ORAL_TABLET | ORAL | 1 refills | Status: DC

## 2016-02-25 MED FILL — HYDROCODON-APAP 5-325: 5-325 | 10 days supply | Qty: 40 | Fill #0

## 2016-02-25 NOTE — Patient Instructions (Signed)
Preventive Care for Adults, Female A healthy lifestyle and preventive care can promote health and wellness. Preventive health guidelines for women include the following key practices.  A routine yearly physical is a good way to check with your health care provider about your health and preventive screening. It is a chance to share any concerns and updates on your health and to receive a thorough exam.  Visit your dentist for a routine exam and preventive care every 6 months. Brush your teeth twice a day and floss once a day. Good oral hygiene prevents tooth decay and gum disease.  The frequency of eye exams is based on your age, health, family medical history, use of contact lenses, and other factors. Follow your health care provider's recommendations for frequency of eye exams.  Eat a healthy diet. Foods like vegetables, fruits, whole grains, low-fat dairy products, and lean protein foods contain the nutrients you need without too many calories. Decrease your intake of foods high in solid fats, added sugars, and salt. Eat the right amount of calories for you.Get information about a proper diet from your health care provider, if necessary.  Regular physical exercise is one of the most important things you can do for your health. Most adults should get at least 150 minutes of moderate-intensity exercise (any activity that increases your heart rate and causes you to sweat) each week. In addition, most adults need muscle-strengthening exercises on 2 or more days a week.  Maintain a healthy weight. The body mass index (BMI) is a screening tool to identify possible weight problems. It provides an estimate of body fat based on height and weight. Your health care provider can find your BMI and can help you achieve or maintain a healthy weight.For adults 20 years and older:  A BMI below 18.5 is considered underweight.  A BMI of 18.5 to 24.9 is normal.  A BMI of 25 to 29.9 is considered overweight.  A  BMI of 30 and above is considered obese.  Maintain normal blood lipids and cholesterol levels by exercising and minimizing your intake of saturated fat. Eat a balanced diet with plenty of fruit and vegetables. Blood tests for lipids and cholesterol should begin at age 45 and be repeated every 5 years. If your lipid or cholesterol levels are high, you are over 50, or you are at high risk for heart disease, you may need your cholesterol levels checked more frequently.Ongoing high lipid and cholesterol levels should be treated with medicines if diet and exercise are not working.  If you smoke, find out from your health care provider how to quit. If you do not use tobacco, do not start.  Lung cancer screening is recommended for adults aged 45-80 years who are at high risk for developing lung cancer because of a history of smoking. A yearly low-dose CT scan of the lungs is recommended for people who have at least a 30-pack-year history of smoking and are a current smoker or have quit within the past 15 years. A pack year of smoking is smoking an average of 1 pack of cigarettes a day for 1 year (for example: 1 pack a day for 30 years or 2 packs a day for 15 years). Yearly screening should continue until the smoker has stopped smoking for at least 15 years. Yearly screening should be stopped for people who develop a health problem that would prevent them from having lung cancer treatment.  If you are pregnant, do not drink alcohol. If you are  breastfeeding, be very cautious about drinking alcohol. If you are not pregnant and choose to drink alcohol, do not have more than 1 drink per day. One drink is considered to be 12 ounces (355 mL) of beer, 5 ounces (148 mL) of wine, or 1.5 ounces (44 mL) of liquor.  Avoid use of street drugs. Do not share needles with anyone. Ask for help if you need support or instructions about stopping the use of drugs.  High blood pressure causes heart disease and increases the risk  of stroke. Your blood pressure should be checked at least every 1 to 2 years. Ongoing high blood pressure should be treated with medicines if weight loss and exercise do not work.  If you are 55-79 years old, ask your health care provider if you should take aspirin to prevent strokes.  Diabetes screening is done by taking a blood sample to check your blood glucose level after you have not eaten for a certain period of time (fasting). If you are not overweight and you do not have risk factors for diabetes, you should be screened once every 3 years starting at age 45. If you are overweight or obese and you are 40-70 years of age, you should be screened for diabetes every year as part of your cardiovascular risk assessment.  Breast cancer screening is essential preventive care for women. You should practice "breast self-awareness." This means understanding the normal appearance and feel of your breasts and may include breast self-examination. Any changes detected, no matter how small, should be reported to a health care provider. Women in their 20s and 30s should have a clinical breast exam (CBE) by a health care provider as part of a regular health exam every 1 to 3 years. After age 40, women should have a CBE every year. Starting at age 40, women should consider having a mammogram (breast X-ray test) every year. Women who have a family history of breast cancer should talk to their health care provider about genetic screening. Women at a high risk of breast cancer should talk to their health care providers about having an MRI and a mammogram every year.  Breast cancer gene (BRCA)-related cancer risk assessment is recommended for women who have family members with BRCA-related cancers. BRCA-related cancers include breast, ovarian, tubal, and peritoneal cancers. Having family members with these cancers may be associated with an increased risk for harmful changes (mutations) in the breast cancer genes BRCA1 and  BRCA2. Results of the assessment will determine the need for genetic counseling and BRCA1 and BRCA2 testing.  Your health care provider may recommend that you be screened regularly for cancer of the pelvic organs (ovaries, uterus, and vagina). This screening involves a pelvic examination, including checking for microscopic changes to the surface of your cervix (Pap test). You may be encouraged to have this screening done every 3 years, beginning at age 21.  For women ages 30-65, health care providers may recommend pelvic exams and Pap testing every 3 years, or they may recommend the Pap and pelvic exam, combined with testing for human papilloma virus (HPV), every 5 years. Some types of HPV increase your risk of cervical cancer. Testing for HPV may also be done on women of any age with unclear Pap test results.  Other health care providers may not recommend any screening for nonpregnant women who are considered low risk for pelvic cancer and who do not have symptoms. Ask your health care provider if a screening pelvic exam is right for   you.  If you have had past treatment for cervical cancer or a condition that could lead to cancer, you need Pap tests and screening for cancer for at least 20 years after your treatment. If Pap tests have been discontinued, your risk factors (such as having a new sexual partner) need to be reassessed to determine if screening should resume. Some women have medical problems that increase the chance of getting cervical cancer. In these cases, your health care provider may recommend more frequent screening and Pap tests.  Colorectal cancer can be detected and often prevented. Most routine colorectal cancer screening begins at the age of 50 years and continues through age 75 years. However, your health care provider may recommend screening at an earlier age if you have risk factors for colon cancer. On a yearly basis, your health care provider may provide home test kits to check  for hidden blood in the stool. Use of a small camera at the end of a tube, to directly examine the colon (sigmoidoscopy or colonoscopy), can detect the earliest forms of colorectal cancer. Talk to your health care provider about this at age 50, when routine screening begins. Direct exam of the colon should be repeated every 5-10 years through age 75 years, unless early forms of precancerous polyps or small growths are found.  People who are at an increased risk for hepatitis B should be screened for this virus. You are considered at high risk for hepatitis B if:  You were born in a country where hepatitis B occurs often. Talk with your health care provider about which countries are considered high risk.  Your parents were born in a high-risk country and you have not received a shot to protect against hepatitis B (hepatitis B vaccine).  You have HIV or AIDS.  You use needles to inject street drugs.  You live with, or have sex with, someone who has hepatitis B.  You get hemodialysis treatment.  You take certain medicines for conditions like cancer, organ transplantation, and autoimmune conditions.  Hepatitis C blood testing is recommended for all people born from 1945 through 1965 and any individual with known risks for hepatitis C.  Practice safe sex. Use condoms and avoid high-risk sexual practices to reduce the spread of sexually transmitted infections (STIs). STIs include gonorrhea, chlamydia, syphilis, trichomonas, herpes, HPV, and human immunodeficiency virus (HIV). Herpes, HIV, and HPV are viral illnesses that have no cure. They can result in disability, cancer, and death.  You should be screened for sexually transmitted illnesses (STIs) including gonorrhea and chlamydia if:  You are sexually active and are younger than 24 years.  You are older than 24 years and your health care provider tells you that you are at risk for this type of infection.  Your sexual activity has changed  since you were last screened and you are at an increased risk for chlamydia or gonorrhea. Ask your health care provider if you are at risk.  If you are at risk of being infected with HIV, it is recommended that you take a prescription medicine daily to prevent HIV infection. This is called preexposure prophylaxis (PrEP). You are considered at risk if:  You are sexually active and do not regularly use condoms or know the HIV status of your partner(s).  You take drugs by injection.  You are sexually active with a partner who has HIV.  Talk with your health care provider about whether you are at high risk of being infected with HIV. If   you choose to begin PrEP, you should first be tested for HIV. You should then be tested every 3 months for as long as you are taking PrEP.  Osteoporosis is a disease in which the bones lose minerals and strength with aging. This can result in serious bone fractures or breaks. The risk of osteoporosis can be identified using a bone density scan. Women ages 67 years and over and women at risk for fractures or osteoporosis should discuss screening with their health care providers. Ask your health care provider whether you should take a calcium supplement or vitamin D to reduce the rate of osteoporosis.  Menopause can be associated with physical symptoms and risks. Hormone replacement therapy is available to decrease symptoms and risks. You should talk to your health care provider about whether hormone replacement therapy is right for you.  Use sunscreen. Apply sunscreen liberally and repeatedly throughout the day. You should seek shade when your shadow is shorter than you. Protect yourself by wearing long sleeves, pants, a wide-brimmed hat, and sunglasses year round, whenever you are outdoors.  Once a month, do a whole body skin exam, using a mirror to look at the skin on your back. Tell your health care provider of new moles, moles that have irregular borders, moles that  are larger than a pencil eraser, or moles that have changed in shape or color.  Stay current with required vaccines (immunizations).  Influenza vaccine. All adults should be immunized every year.  Tetanus, diphtheria, and acellular pertussis (Td, Tdap) vaccine. Pregnant women should receive 1 dose of Tdap vaccine during each pregnancy. The dose should be obtained regardless of the length of time since the last dose. Immunization is preferred during the 27th-36th week of gestation. An adult who has not previously received Tdap or who does not know her vaccine status should receive 1 dose of Tdap. This initial dose should be followed by tetanus and diphtheria toxoids (Td) booster doses every 10 years. Adults with an unknown or incomplete history of completing a 3-dose immunization series with Td-containing vaccines should begin or complete a primary immunization series including a Tdap dose. Adults should receive a Td booster every 10 years.  Varicella vaccine. An adult without evidence of immunity to varicella should receive 2 doses or a second dose if she has previously received 1 dose. Pregnant females who do not have evidence of immunity should receive the first dose after pregnancy. This first dose should be obtained before leaving the health care facility. The second dose should be obtained 4-8 weeks after the first dose.  Human papillomavirus (HPV) vaccine. Females aged 13-26 years who have not received the vaccine previously should obtain the 3-dose series. The vaccine is not recommended for use in pregnant females. However, pregnancy testing is not needed before receiving a dose. If a female is found to be pregnant after receiving a dose, no treatment is needed. In that case, the remaining doses should be delayed until after the pregnancy. Immunization is recommended for any person with an immunocompromised condition through the age of 61 years if she did not get any or all doses earlier. During the  3-dose series, the second dose should be obtained 4-8 weeks after the first dose. The third dose should be obtained 24 weeks after the first dose and 16 weeks after the second dose.  Zoster vaccine. One dose is recommended for adults aged 30 years or older unless certain conditions are present.  Measles, mumps, and rubella (MMR) vaccine. Adults born  before 1957 generally are considered immune to measles and mumps. Adults born in 1957 or later should have 1 or more doses of MMR vaccine unless there is a contraindication to the vaccine or there is laboratory evidence of immunity to each of the three diseases. A routine second dose of MMR vaccine should be obtained at least 28 days after the first dose for students attending postsecondary schools, health care workers, or international travelers. People who received inactivated measles vaccine or an unknown type of measles vaccine during 1963-1967 should receive 2 doses of MMR vaccine. People who received inactivated mumps vaccine or an unknown type of mumps vaccine before 1979 and are at high risk for mumps infection should consider immunization with 2 doses of MMR vaccine. For females of childbearing age, rubella immunity should be determined. If there is no evidence of immunity, females who are not pregnant should be vaccinated. If there is no evidence of immunity, females who are pregnant should delay immunization until after pregnancy. Unvaccinated health care workers born before 1957 who lack laboratory evidence of measles, mumps, or rubella immunity or laboratory confirmation of disease should consider measles and mumps immunization with 2 doses of MMR vaccine or rubella immunization with 1 dose of MMR vaccine.  Pneumococcal 13-valent conjugate (PCV13) vaccine. When indicated, a person who is uncertain of his immunization history and has no record of immunization should receive the PCV13 vaccine. All adults 65 years of age and older should receive this  vaccine. An adult aged 19 years or older who has certain medical conditions and has not been previously immunized should receive 1 dose of PCV13 vaccine. This PCV13 should be followed with a dose of pneumococcal polysaccharide (PPSV23) vaccine. Adults who are at high risk for pneumococcal disease should obtain the PPSV23 vaccine at least 8 weeks after the dose of PCV13 vaccine. Adults older than 54 years of age who have normal immune system function should obtain the PPSV23 vaccine dose at least 1 year after the dose of PCV13 vaccine.  Pneumococcal polysaccharide (PPSV23) vaccine. When PCV13 is also indicated, PCV13 should be obtained first. All adults aged 65 years and older should be immunized. An adult younger than age 65 years who has certain medical conditions should be immunized. Any person who resides in a nursing home or long-term care facility should be immunized. An adult smoker should be immunized. People with an immunocompromised condition and certain other conditions should receive both PCV13 and PPSV23 vaccines. People with human immunodeficiency virus (HIV) infection should be immunized as soon as possible after diagnosis. Immunization during chemotherapy or radiation therapy should be avoided. Routine use of PPSV23 vaccine is not recommended for American Indians, [place] Natives, or people younger than 65 years unless there are medical conditions that require PPSV23 vaccine. When indicated, people who have unknown immunization and have no record of immunization should receive PPSV23 vaccine. One-time revaccination 5 years after the first dose of PPSV23 is recommended for people aged 19-64 years who have chronic kidney failure, nephrotic syndrome, asplenia, or immunocompromised conditions. People who received 1-2 doses of PPSV23 before age 65 years should receive another dose of PPSV23 vaccine at age 65 years or later if at least 5 years have passed since the previous dose. Doses of PPSV23 are not  needed for people immunized with PPSV23 at or after age 65 years.  Meningococcal vaccine. Adults with asplenia or persistent complement component deficiencies should receive 2 doses of quadrivalent meningococcal conjugate (MenACWY-D) vaccine. The doses should be obtained   at least 2 months apart. Microbiologists working with certain meningococcal bacteria, [place] recruits, people at risk during an outbreak, and people who travel to or live in countries with a high rate of meningitis should be immunized. A first-year college student up through age 34 years who is living in a residence hall should receive a dose if she did not receive a dose on or after her 16th birthday. Adults who have certain high-risk conditions should receive one or more doses of vaccine.  Hepatitis A vaccine. Adults who wish to be protected from this disease, have certain high-risk conditions, work with hepatitis A-infected animals, work in hepatitis A research labs, or travel to or work in countries with a high rate of hepatitis A should be immunized. Adults who were previously unvaccinated and who anticipate close contact with an international adoptee during the first 60 days after arrival in the [place] from a country with a high rate of hepatitis A should be immunized.  Hepatitis B vaccine. Adults who wish to be protected from this disease, have certain high-risk conditions, may be exposed to blood or other infectious body fluids, are household contacts or sex partners of hepatitis B positive people, are clients or workers in certain care facilities, or travel to or work in countries with a high rate of hepatitis B should be immunized.  Haemophilus influenzae type b (Hib) vaccine. A previously unvaccinated person with asplenia or sickle cell disease or having a scheduled splenectomy should receive 1 dose of Hib vaccine. Regardless of previous immunization, a recipient of a hematopoietic stem cell transplant should receive a  3-dose series 6-12 months after her successful transplant. Hib vaccine is not recommended for adults with HIV infection. Preventive Services / Frequency Ages 35 to 4 years  Blood pressure check.** / Every 3-5 years.  Lipid and cholesterol check.** / Every 5 years beginning at age 60.  Clinical breast exam.** / Every 3 years for women in their 71s and 10s.  BRCA-related cancer risk assessment.** / For women who have family members with a BRCA-related cancer (breast, ovarian, tubal, or peritoneal cancers).  Pap test.** / Every 2 years from ages 76 through 26. Every 3 years starting at age 61 through age 76 or 93 with a history of 3 consecutive normal Pap tests.  HPV screening.** / Every 3 years from ages 37 through ages 60 to 51 with a history of 3 consecutive normal Pap tests.  Hepatitis C blood test.** / For any individual with known risks for hepatitis C.  Skin self-exam. / Monthly.  Influenza vaccine. / Every year.  Tetanus, diphtheria, and acellular pertussis (Tdap, Td) vaccine.** / Consult your health care provider. Pregnant women should receive 1 dose of Tdap vaccine during each pregnancy. 1 dose of Td every 10 years.  Varicella vaccine.** / Consult your health care provider. Pregnant females who do not have evidence of immunity should receive the first dose after pregnancy.  HPV vaccine. / 3 doses over 6 months, if 93 and younger. The vaccine is not recommended for use in pregnant females. However, pregnancy testing is not needed before receiving a dose.  Measles, mumps, rubella (MMR) vaccine.** / You need at least 1 dose of MMR if you were born in 1957 or later. You may also need a 2nd dose. For females of childbearing age, rubella immunity should be determined. If there is no evidence of immunity, females who are not pregnant should be vaccinated. If there is no evidence of immunity, females who are  pregnant should delay immunization until after pregnancy.  Pneumococcal  13-valent conjugate (PCV13) vaccine.** / Consult your health care provider.  Pneumococcal polysaccharide (PPSV23) vaccine.** / 1 to 2 doses if you smoke cigarettes or if you have certain conditions.  Meningococcal vaccine.** / 1 dose if you are age 68 to 8 years and a Market researcher living in a residence hall, or have one of several medical conditions, you need to get vaccinated against meningococcal disease. You may also need additional booster doses.  Hepatitis A vaccine.** / Consult your health care provider.  Hepatitis B vaccine.** / Consult your health care provider.  Haemophilus influenzae type b (Hib) vaccine.** / Consult your health care provider. Ages 7 to 53 years  Blood pressure check.** / Every year.  Lipid and cholesterol check.** / Every 5 years beginning at age 25 years.  Lung cancer screening. / Every year if you are aged 11-80 years and have a 30-pack-year history of smoking and currently smoke or have quit within the past 15 years. Yearly screening is stopped once you have quit smoking for at least 15 years or develop a health problem that would prevent you from having lung cancer treatment.  Clinical breast exam.** / Every year after age 48 years.  BRCA-related cancer risk assessment.** / For women who have family members with a BRCA-related cancer (breast, ovarian, tubal, or peritoneal cancers).  Mammogram.** / Every year beginning at age 41 years and continuing for as long as you are in good health. Consult with your health care provider.  Pap test.** / Every 3 years starting at age 65 years through age 37 or 70 years with a history of 3 consecutive normal Pap tests.  HPV screening.** / Every 3 years from ages 72 years through ages 60 to 40 years with a history of 3 consecutive normal Pap tests.  Fecal occult blood test (FOBT) of stool. / Every year beginning at age 21 years and continuing until age 5 years. You may not need to do this test if you get  a colonoscopy every 10 years.  Flexible sigmoidoscopy or colonoscopy.** / Every 5 years for a flexible sigmoidoscopy or every 10 years for a colonoscopy beginning at age 35 years and continuing until age 48 years.  Hepatitis C blood test.** / For all people born from [birth year] through 1965 and any individual with known risks for hepatitis C.  Skin self-exam. / Monthly.  Influenza vaccine. / Every year.  Tetanus, diphtheria, and acellular pertussis (Tdap/Td) vaccine.** / Consult your health care provider. Pregnant women should receive 1 dose of Tdap vaccine during each pregnancy. 1 dose of Td every 10 years.  Varicella vaccine.** / Consult your health care provider. Pregnant females who do not have evidence of immunity should receive the first dose after pregnancy.  Zoster vaccine.** / 1 dose for adults aged 30 years or older.  Measles, mumps, rubella (MMR) vaccine.** / You need at least 1 dose of MMR if you were born in 1957 or later. You may also need a second dose. For females of childbearing age, rubella immunity should be determined. If there is no evidence of immunity, females who are not pregnant should be vaccinated. If there is no evidence of immunity, females who are pregnant should delay immunization until after pregnancy.  Pneumococcal 13-valent conjugate (PCV13) vaccine.** / Consult your health care provider.  Pneumococcal polysaccharide (PPSV23) vaccine.** / 1 to 2 doses if you smoke cigarettes or if you have certain conditions.  Meningococcal vaccine.** /  Consult your health care provider.  Hepatitis A vaccine.** / Consult your health care provider.  Hepatitis B vaccine.** / Consult your health care provider.  Haemophilus influenzae type b (Hib) vaccine.** / Consult your health care provider. Ages 64 years and over  Blood pressure check.** / Every year.  Lipid and cholesterol check.** / Every 5 years beginning at age 23 years.  Lung cancer screening. / Every year if you  are aged 16-80 years and have a 30-pack-year history of smoking and currently smoke or have quit within the past 15 years. Yearly screening is stopped once you have quit smoking for at least 15 years or develop a health problem that would prevent you from having lung cancer treatment.  Clinical breast exam.** / Every year after age 74 years.  BRCA-related cancer risk assessment.** / For women who have family members with a BRCA-related cancer (breast, ovarian, tubal, or peritoneal cancers).  Mammogram.** / Every year beginning at age 44 years and continuing for as long as you are in good health. Consult with your health care provider.  Pap test.** / Every 3 years starting at age 58 years through age 22 or 39 years with 3 consecutive normal Pap tests. Testing can be stopped between 65 and 70 years with 3 consecutive normal Pap tests and no abnormal Pap or HPV tests in the past 10 years.  HPV screening.** / Every 3 years from ages 64 years through ages 70 or 61 years with a history of 3 consecutive normal Pap tests. Testing can be stopped between 65 and 70 years with 3 consecutive normal Pap tests and no abnormal Pap or HPV tests in the past 10 years.  Fecal occult blood test (FOBT) of stool. / Every year beginning at age 40 years and continuing until age 27 years. You may not need to do this test if you get a colonoscopy every 10 years.  Flexible sigmoidoscopy or colonoscopy.** / Every 5 years for a flexible sigmoidoscopy or every 10 years for a colonoscopy beginning at age 7 years and continuing until age 32 years.  Hepatitis C blood test.** / For all people born from [birth year] through 1964 and any individual with known risks for hepatitis C.  Osteoporosis screening.** / A one-time screening for women ages 30 years and over and women at risk for fractures or osteoporosis.  Skin self-exam. / Monthly.  Influenza vaccine. / Every year.  Tetanus, diphtheria, and acellular pertussis (Tdap/Td)  vaccine.** / 1 dose of Td every 10 years.  Varicella vaccine.** / Consult your health care provider.  Zoster vaccine.** / 1 dose for adults aged 35 years or older.  Pneumococcal 13-valent conjugate (PCV13) vaccine.** / Consult your health care provider.  Pneumococcal polysaccharide (PPSV23) vaccine.** / 1 dose for all adults aged 46 years and older.  Meningococcal vaccine.** / Consult your health care provider.  Hepatitis A vaccine.** / Consult your health care provider.  Hepatitis B vaccine.** / Consult your health care provider.  Haemophilus influenzae type b (Hib) vaccine.** / Consult your health care provider. ** Family history and personal history of risk and conditions may change your health care provider's recommendations.   This information is not intended to replace advice given to you by your health care provider. Make sure you discuss any questions you have with your health care provider.   Document Released: 08/02/2001 Document Revised: 06/27/2014 Document Reviewed: 11/01/2010 Elsevier Interactive Patient Education [employer].

## 2016-02-25 NOTE — Progress Notes (Signed)
Subjective:   Sandra [name] is a 55 y.o. female who presents for Medicare Annual (Subsequent) preventive examination.  Review of Systems:     Review of Systems  Constitutional: Negative for activity change, appetite change and fatigue.  HENT: Negative for hearing loss, congestion, tinnitus and ear discharge.   Eyes: Negative for visual disturbance  Respiratory: Negative for cough, chest tightness and shortness of breath.   Cardiovascular: Negative for chest pain, palpitations and leg swelling.  Gastrointestinal: Negative for abdominal pain, diarrhea, constipation and abdominal distention.  Genitourinary: Negative for urgency, frequency, decreased urine volume and difficulty urinating.  Musculoskeletal: Negative for back pain, arthralgias and gait problem.  Skin: Negative for color change, pallor and rash.  Neurological: Negative for dizziness, light-headedness, numbness and headaches.  Hematological: Negative for adenopathy. Does not bruise/bleed easily.  Psychiatric/Behavioral: Negative for suicidal ideas, confusion, sleep disturbance, self-injury, dysphoric mood, decreased concentration and agitation.  Pt is able to read and write and can do all ADLs No risk for falling No abuse/ violence in home       Objective:     Vitals: BP (!) 183/86 (BP Location: Right Arm, Patient Position: Sitting, Cuff Size: Normal)   Pulse (!) 52   Temp 98.3 F (36.8 C) (Oral)   Resp 17   Ht 5' 5"  (1.651 m)   Wt 191 lb 9.6 oz (86.9 kg)   SpO2 100%   BMI 31.88 kg/m   Body mass index is 31.88 kg/m.  BP (!) 183/86 (BP Location: Right Arm, Patient Position: Sitting, Cuff Size: Normal)   Pulse (!) 52   Temp 98.3 F (36.8 C) (Oral)   Resp 17   Ht 5' 5"  (1.651 m)   Wt 191 lb 9.6 oz (86.9 kg)   SpO2 100%   BMI 31.88 kg/m  General appearance: alert, cooperative, appears stated age and no distress Head: Normocephalic, without obvious abnormality, atraumatic Eyes: conjunctivae/corneas clear.  PERRL, EOM's intact. Fundi benign. Ears: normal TM's and external ear canals both ears Nose: Nares normal. Septum midline. Mucosa normal. No drainage or sinus tenderness. Throat: lips, mucosa, and tongue normal; teeth and gums normal Neck: no adenopathy, no carotid bruit, no JVD, supple, symmetrical, trachea midline and thyroid not enlarged, symmetric, no tenderness/mass/nodules Back: symmetric, no curvature. ROM normal. No CVA tenderness. Lungs: clear to auscultation bilaterally Breasts: normal appearance, no masses or tenderness Heart: regular rate and rhythm, S1, S2 normal, no murmur, click, rub or gallop Abdomen: soft, non-tender; bowel sounds normal; no masses,  no organomegaly Pelvic: deferred Extremities: extremities normal, atraumatic, no cyanosis or edema Pulses: 2+ and symmetric Skin: Skin color, texture, turgor normal. No rashes or lesions Lymph nodes: Cervical, supraclavicular, and axillary nodes normal. Neurologic: Alert and oriented X 3, normal strength and tone. Normal symmetric reflexes. Normal coordination and gait  Tobacco History  Smoking Status  . Current Every Day Smoker  . Packs/day: 0.50  . Years: 30.00  . Types: Cigarettes  . Last attempt to quit: 04/07/2014  Smokeless Tobacco  . Never Used    Comment: Trying to quit again.     Ready to quit: Yes Counseling given: Yes   Past Medical History:  Diagnosis Date  . Acute right MCA stroke ([place]) 01/27/11   Acute R MCA stroked  with L arm and leg weakness  . Anxiety 1980s   On Klonipin since age 75. Had addiction problem with Xanax which  Was therefore d/c . [facility] the [facility].   . Arthritis 04/16/2011  .  Blind right eye   . CAD (coronary artery disease) 2007   istory of MI with stent in 2007 by Dr. Chancy Milroy, [facility]. Stent placement by Dr Cleatis Polka   . Depression 03/07/2011  . Fracture of fifth toe, left, closed 01/28/2011   History of left fifth toe proximal phalanx fracture when  patient had a stroke (01/2011) and fell. Seen by Dr Doran Durand.    . Fractured toe 01/2011   History of left fifth toe proximal phalanx fracture when patient had a stroke (01/2011) and fell. Seen by Dr Doran Durand.   . Hyperlipidemia 03/07/2011  . Hypertension 03/07/2011  . Incontinence 04/14/2011  . Myocardial infarct, old 2008   stents  . PFO (patent foramen ovale) August 2012   PFO seen on TEE during hospitalization in 01/2011.  Patient to f/u with Dr. Leonie Man, neurology, for enrollment in trial for medical treatment of PFO  . Pneumonia   . Prediabetes 03/07/2011  . Psoriasis 03/07/2011  . Substance abuse    h/o narcotic abuse pt denies as of 01/09/12  . Tobacco abuse 03/07/2011  . Tobacco abuse 03/07/2011   Quit 01/2011    Past Surgical History:  Procedure Laterality Date  . cardiac stent     2008  . EYE SURGERY     Family History  Problem Relation Age of Onset  . Heart disease Mother   . Diabetes Mother   . Heart disease Father   . Diabetes Father   . Cancer Sister 31    ovarian  . Heart disease Brother   . Diabetes Brother   . Heart disease Maternal Grandmother   . Heart disease Maternal Grandfather   . Heart disease Paternal Grandmother   . Heart disease Paternal Grandfather   . Lung cancer Brother   . Heart disease Brother    History  Sexual Activity  . Sexual activity: Yes  . Partners: Male    Outpatient Encounter Prescriptions as of 02/25/2016  Medication Sig  . albuterol (PROVENTIL HFA;VENTOLIN HFA) 108 (90 BASE) MCG/ACT inhaler Inhale 2 puffs into the lungs every 4 (four) hours as needed for wheezing or shortness of breath (or coughing).  Marland Kitchen allopurinol (ZYLOPRIM) 300 MG tablet Take 1 tablet (300 mg total) by mouth daily.  Marland Kitchen amLODipine (NORVASC) 5 MG tablet Take 2 tablets (10 mg total) by mouth daily.  Marland Kitchen atropine 1 % ophthalmic solution   . clopidogrel (PLAVIX) 75 MG tablet Take 1 tablet (75 mg total) by mouth daily.  . diclofenac (VOLTAREN) 75 MG EC tablet Take 1 tablet (75  mg total) by mouth 2 (two) times daily.  . eszopiclone (LUNESTA) 2 MG TABS tablet Take 1 tablet (2 mg total) by mouth at bedtime as needed. for sleep  . fenofibrate 160 MG tablet Take 1 tablet (160 mg total) by mouth daily.  Marland Kitchen LORazepam (ATIVAN) 1 MG tablet TAKE 1 TABLET AT BEDTIME AS NEEDED FOR ANXIETY  OR FOR SLEEP  . lovastatin (MEVACOR) 20 MG tablet Take 1 tablet (20 mg total) by mouth at bedtime.  . metoprolol (LOPRESSOR) 100 MG tablet Take 1 tablet (100 mg total) by mouth 2 (two) times daily.  . potassium chloride (MICRO-K) 10 MEQ CR capsule TAKE 2 CAPSULES BY MOUTH DAILY  . prednisoLONE acetate (PRED FORTE) 1 % ophthalmic suspension 1 drop 4 (four) times daily.  Marland Kitchen tiZANidine (ZANAFLEX) 4 MG tablet Take 1 tablet (4 mg total) by mouth every 8 (eight) hours as needed for muscle spasms.  . [DISCONTINUED] allopurinol (ZYLOPRIM) 300  MG tablet TAKE 1 TABLET BY MOUTH EVERY DAY  . [DISCONTINUED] amLODipine (NORVASC) 5 MG tablet Take 2 tablets (10 mg total) by mouth daily.  . [DISCONTINUED] clopidogrel (PLAVIX) 75 MG tablet Take 1 tablet (75 mg total) by mouth daily.  . [DISCONTINUED] fenofibrate 160 MG tablet TAKE 1 TABLET EVERY DAY  . [DISCONTINUED] LORazepam (ATIVAN) 1 MG tablet TAKE 1 TABLET AT BEDTIME AS NEEDED FOR ANXIETY  OR FOR SLEEP  . [DISCONTINUED] LORazepam (ATIVAN) 1 MG tablet TAKE 1 TABLET AT BEDTIME AS NEEDED FOR ANXIETY  OR FOR SLEEP  . [DISCONTINUED] lovastatin (MEVACOR) 20 MG tablet Take 1 tablet (20 mg total) by mouth at bedtime.  . [DISCONTINUED] metoprolol (LOPRESSOR) 100 MG tablet TAKE 1 TABLET TWICE DAILY  . nicotine (NICOTROL) 10 MG inhaler Inhale 1 cartridge (1 continuous puffing total) into the lungs as needed for smoking cessation.   No facility-administered encounter medications on file as of 02/25/2016.     Activities of Daily Living In your present state of health, do you have any difficulty performing the following activities: 02/25/2016  Hearing? N  Vision? Y    Difficulty concentrating or making decisions? Y  Walking or climbing stairs? Y  Dressing or bathing? Y  Doing errands, shopping? Y  Some recent data might be hidden    Patient Care Team: Ann Held, DO as PCP - General (Family Medicine) Lorretta Harp, MD as Consulting Physician (Cardiology) Garvin Fila, MD as Consulting Physician (Neurology) Alm Bustard, MD as Referring Physician (Ophthalmology)    Assessment:    CPE Exercise Activities and Dietary recommendations Current Exercise Habits: The patient does not participate in regular exercise at present, Exercise limited by: None identified  Goals    . Blood Pressure < 130/80    . LDL CALC < 70    . Quit smoking / using tobacco      Fall Risk Fall Risk  02/22/2015 08/22/2014 04/08/2014 03/25/2014 01/03/2014  Falls in the past year? No No No No Yes  Number falls in past yr: - - - - 1  Injury with Fall? - - - - Yes  Risk Factor Category  - - - - High Fall Risk  Risk for fall due to : - - History of fall(s);Impaired balance/gait History of fall(s);Impaired balance/gait History of fall(s);Impaired balance/gait  Risk for fall due to (comments): - - - - -   Depression Screen PHQ 2/9 Scores 02/25/2016 02/22/2015 08/22/2014 04/08/2014  PHQ - 2 Score 0 0 0 2  PHQ- 9 Score - - - 6     Cognitive Testing MMSE - Mini Mental State Exam 02/25/2016  Orientation to time 5  Orientation to Place 5  Registration 3  Attention/ Calculation 5  Recall 3  Language- name 2 objects 2  Language- repeat 1  Language- follow 3 step command 3  Language- read & follow direction 1  Write a sentence 1  Copy design 1  Total score 30    Immunization History  Administered Date(s) Administered  . Influenza Split 04/21/2011, 02/29/2012  . Influenza,inj,Quad PF,36+ Mos 03/01/2013, 03/27/2015, 02/25/2016  . Pneumococcal Polysaccharide-23 01/29/2011  . Tdap 06/29/2011   Screening Tests Health Maintenance  Topic Date Due  .  PNEUMOCOCCAL POLYSACCHARIDE VACCINE (2) 01/29/2016  . PAP SMEAR  02/16/2017  . LIPID PANEL  02/24/2017  . COLONOSCOPY  06/25/2017  . MAMMOGRAM  08/30/2017  . TETANUS/TDAP  06/28/2021  . INFLUENZA VACCINE  Completed  . Hepatitis C Screening  Completed  . HIV Screening  Completed      Plan:   see AVS During the course of the visit the patient was educated and counseled about the following appropriate screening and preventive services:   Vaccines to include Pneumoccal, Influenza, Hepatitis B, Td, Zostavax, HCV  Electrocardiogram  Cardiovascular Disease  Colorectal cancer screening  Bone density screening  Diabetes screening  Glaucoma screening  Mammography/PAP  Nutrition counseling   Patient Instructions (the written plan) was given to the patient.    1. Smoking greater than 10 pack years   - nicotine (NICOTROL) 10 MG inhaler; Inhale 1 cartridge (1 continuous puffing total) into the lungs as needed for smoking cessation.  Dispense: 42 each; Refill: 0  2. Hyperlipidemia con't meds Check labs - lovastatin (MEVACOR) 20 MG tablet; Take 1 tablet (20 mg total) by mouth at bedtime.  Dispense: 90 tablet; Refill: 3 - fenofibrate 160 MG tablet; Take 1 tablet (160 mg total) by mouth daily.  Dispense: 90 tablet; Refill: 1  3. Essential hypertension Stable con't meds - metoprolol (LOPRESSOR) 100 MG tablet; Take 1 tablet (100 mg total) by mouth 2 (two) times daily.  Dispense: 180 tablet; Refill: 1 - amLODipine (NORVASC) 5 MG tablet; Take 2 tablets (10 mg total) by mouth daily.  Dispense: 180 tablet; Refill: 1  4. Idiopathic gout, unspecified chronicity, unspecified site con't meds - allopurinol (ZYLOPRIM) 300 MG tablet; Take 1 tablet (300 mg total) by mouth daily.  Dispense: 30 tablet; Refill: 5  5. History of CVA (cerebrovascular accident)   - clopidogrel (PLAVIX) 75 MG tablet; Take 1 tablet (75 mg total) by mouth daily.  Dispense: 90 tablet; Refill: 3  6.  Anxiety stable - LORazepam (ATIVAN) 1 MG tablet; TAKE 1 TABLET AT BEDTIME AS NEEDED FOR ANXIETY  OR FOR SLEEP  Dispense: 90 tablet; Refill: [place], DO  02/28/2016

## 2016-02-25 NOTE — Progress Notes (Signed)
Pre visit review using our clinic review tool, if applicable. No additional management support is needed unless otherwise documented below in the visit note. 

## 2016-02-26 LAB — COMPREHENSIVE METABOLIC PANEL
ALT: 9 U/L (ref 0–35)
AST: 11 U/L (ref 0–37)
Albumin: 4.4 g/dL (ref 3.5–5.2)
Alkaline Phosphatase: 70 U/L (ref 39–117)
BUN: 21 mg/dL (ref 6–23)
CALCIUM: 9.3 mg/dL (ref 8.4–10.5)
CHLORIDE: 109 meq/L (ref 96–112)
CO2: 26 meq/L (ref 19–32)
CREATININE: 0.71 mg/dL (ref 0.40–1.20)
GFR: 90.98 mL/min (ref >60.00)
GLUCOSE: 77 mg/dL (ref 70–99)
Potassium: 4.8 mEq/L (ref 3.5–5.1)
SODIUM: 141 meq/L (ref 135–145)
Total Bilirubin: 0.2 mg/dL (ref 0.2–1.2)
Total Protein: 7 g/dL (ref 6.0–8.3)

## 2016-02-26 LAB — CBC WITH DIFFERENTIAL/PLATELET
BASOS ABS: 0.1 10*3/uL (ref 0.0–0.1)
BASOS PCT: 0.5 % (ref 0.0–3.0)
EOS ABS: 0.7 10*3/uL (ref 0.0–0.7)
Eosinophils Relative: 4.6 % (ref 0.0–5.0)
HCT: 40.5 % (ref 36.0–46.0)
Hemoglobin: 13.4 g/dL (ref 12.0–15.0)
LYMPHS ABS: 4.2 10*3/uL — AB (ref 0.7–4.0)
Lymphocytes Relative: 27.8 % (ref 12.0–46.0)
MCHC: 33.2 g/dL (ref 30.0–36.0)
MCV: 89.7 fl (ref 78.0–100.0)
MONO ABS: 0.6 10*3/uL (ref 0.1–1.0)
Monocytes Relative: 4.2 % (ref 3.0–12.0)
NEUTROS ABS: 9.4 10*3/uL — AB (ref 1.4–7.7)
NEUTROS PCT: 62.9 % (ref 43.0–77.0)
PLATELETS: 346 10*3/uL (ref 150.0–400.0)
RBC: 4.52 Mil/uL (ref 3.87–5.11)
RDW: 15.4 % (ref 11.5–15.5)
WBC: 15 10*3/uL — ABNORMAL HIGH (ref 4.0–10.5)

## 2016-02-26 LAB — LIPID PANEL
CHOL/HDL RATIO: 5
CHOLESTEROL: 159 mg/dL (ref 0–200)
HDL: 31.9 mg/dL — AB (ref >39.00)
NonHDL: 127.56
Triglycerides: 232 mg/dL — ABNORMAL HIGH (ref 0.0–149.0)
VLDL: 46.4 mg/dL — AB (ref 0.0–40.0)

## 2016-02-26 LAB — LDL CHOLESTEROL, DIRECT: Direct LDL: 97 mg/dL

## 2016-02-28 ENCOUNTER — Encounter: Payer: Self-pay | Admitting: Family Medicine

## 2016-02-28 MED ORDER — CLOPIDOGREL BISULFATE 75 MG PO TABS: 75.0000 mg | ORAL_TABLET | Freq: Every day | ORAL | 3 refills | Status: DC

## 2016-02-28 MED ORDER — ALLOPURINOL 300 MG PO TABS: 300.0000 mg | ORAL_TABLET | Freq: Every day | ORAL | 5 refills | Status: DC

## 2016-02-28 MED ORDER — FENOFIBRATE 160 MG PO TABS: 160.0000 mg | ORAL_TABLET | Freq: Every day | ORAL | 1 refills | Status: DC

## 2016-02-28 MED ORDER — METOPROLOL TARTRATE 100 MG PO TABS: 100.0000 mg | ORAL_TABLET | Freq: Two times a day (BID) | ORAL | 1 refills | Status: DC

## 2016-02-28 MED ORDER — LORAZEPAM 1 MG PO TABS: ORAL_TABLET | ORAL | 1 refills | Status: DC

## 2016-02-28 MED ORDER — AMLODIPINE BESYLATE 5 MG PO TABS: 10.0000 mg | ORAL_TABLET | Freq: Every day | ORAL | 1 refills | Status: DC

## 2016-02-28 MED ORDER — LOVASTATIN 20 MG PO TABS: 20.0000 mg | ORAL_TABLET | Freq: Every day | ORAL | 3 refills | Status: DC

## 2016-03-01 ENCOUNTER — Other Ambulatory Visit: Payer: Self-pay

## 2016-03-01 DIAGNOSIS — D72829 Elevated white blood cell count, unspecified: Secondary | ICD-10-CM

## 2016-03-02 ENCOUNTER — Other Ambulatory Visit (INDEPENDENT_AMBULATORY_CARE_PROVIDER_SITE_OTHER): Payer: Commercial Managed Care - HMO

## 2016-03-02 ENCOUNTER — Telehealth: Payer: Self-pay

## 2016-03-02 ENCOUNTER — Telehealth: Payer: Self-pay | Admitting: Family Medicine

## 2016-03-02 DIAGNOSIS — R350 Frequency of micturition: Secondary | ICD-10-CM | POA: Diagnosis not present

## 2016-03-02 DIAGNOSIS — D72829 Elevated white blood cell count, unspecified: Secondary | ICD-10-CM

## 2016-03-02 LAB — POC URINALSYSI DIPSTICK (AUTOMATED)
Glucose, UA: 100
LEUKOCYTES UA: NEGATIVE
NITRITE UA: NEGATIVE
PH UA: 6
RBC UA: NEGATIVE
Spec Grav, UA: >=1.03
UROBILINOGEN UA: 2

## 2016-03-02 LAB — CBC WITH DIFFERENTIAL/PLATELET
BASOS ABS: 0 10*3/uL (ref 0.0–0.1)
Basophils Relative: 0.3 % (ref 0.0–3.0)
EOS ABS: 0.3 10*3/uL (ref 0.0–0.7)
Eosinophils Relative: 2 % (ref 0.0–5.0)
HCT: 42.3 % (ref 36.0–46.0)
Hemoglobin: 14.3 g/dL (ref 12.0–15.0)
LYMPHS ABS: 3 10*3/uL (ref 0.7–4.0)
Lymphocytes Relative: 21.8 % (ref 12.0–46.0)
MCHC: 33.8 g/dL (ref 30.0–36.0)
MCV: 88.3 fl (ref 78.0–100.0)
MONO ABS: 0.5 10*3/uL (ref 0.1–1.0)
MONOS PCT: 3.6 % (ref 3.0–12.0)
NEUTROS ABS: 10.1 10*3/uL — AB (ref 1.4–7.7)
NEUTROS PCT: 72.3 % (ref 43.0–77.0)
PLATELETS: 309 10*3/uL (ref 150.0–400.0)
RBC: 4.79 Mil/uL (ref 3.87–5.11)
RDW: 15.5 % (ref 11.5–15.5)
WBC: 14 10*3/uL — ABNORMAL HIGH (ref 4.0–10.5)

## 2016-03-02 MED FILL — LOVASTATIN 20 MG TABLET: 20 | 90 days supply | Qty: 90 | Fill #0

## 2016-03-02 MED FILL — CLOPIDOGREL 75 MG TABLET: 75 | 90 days supply | Qty: 90 | Fill #0

## 2016-03-02 MED FILL — AMLODIPINE BESYLATE 5 MG TA: 5 | 90 days supply | Qty: 180 | Fill #0

## 2016-03-02 NOTE — Telephone Encounter (Signed)
Okay to bring pt and spouse in for glucometer teaching?

## 2016-03-02 NOTE — Telephone Encounter (Signed)
Pt's spouse Dexter called in to be advised on how to use pt's Glucose monitor to check her blood sugar. He would like to speak with a nurse for assistance.    OneTouch monitor    CB: [phone]

## 2016-03-02 NOTE — Telephone Encounter (Signed)
yes

## 2016-03-02 NOTE — Telephone Encounter (Signed)
Recheck 3 months-----  Lipid, cmp [id number]

## 2016-03-02 NOTE — Telephone Encounter (Signed)
The patient came in to leave a urine sample, and repeat the CBC-D and she has glucose in her Urine. I checked her Glucose and it was 165 non-fasting, she said she had coffee and orange juice this morning. I gave her a glucometer and education material, she said she was on Metformin in the past but has not taken it in years because she was taken off of it. I advised the patient to check her glucose random and if they continue to be elevated to let us know. I also made her aware, I would call her and let her know when you would like to recheck the labs along with an A1C. Please advise    KP

## 2016-03-03 ENCOUNTER — Other Ambulatory Visit: Payer: Self-pay | Admitting: Family Medicine

## 2016-03-03 DIAGNOSIS — N390 Urinary tract infection, site not specified: Secondary | ICD-10-CM

## 2016-03-03 LAB — URINE CULTURE: Colony Count: >=100000

## 2016-03-03 MED ORDER — AMOXICILLIN-POT CLAVULANATE 875-125 MG PO TABS: 1.0000 | ORAL_TABLET | Freq: Two times a day (BID) | ORAL | 0 refills | Status: DC

## 2016-03-03 NOTE — Telephone Encounter (Signed)
Nurse visit for glucometer teaching scheduled for next week.

## 2016-03-03 NOTE — Telephone Encounter (Signed)
Patient notified and 3 month follow-up scheduled. Also scheduled nurse visit for glucometer teaching next week per other phone note.

## 2016-03-04 ENCOUNTER — Other Ambulatory Visit: Payer: Self-pay

## 2016-03-04 DIAGNOSIS — N390 Urinary tract infection, site not specified: Secondary | ICD-10-CM

## 2016-03-04 DIAGNOSIS — M4316 Spondylolisthesis, lumbar region: Secondary | ICD-10-CM | POA: Diagnosis not present

## 2016-03-04 DIAGNOSIS — S32010A Wedge compression fracture of first lumbar vertebra, initial encounter for closed fracture: Secondary | ICD-10-CM | POA: Diagnosis not present

## 2016-03-04 DIAGNOSIS — M4856XA Collapsed vertebra, not elsewhere classified, lumbar region, initial encounter for fracture: Secondary | ICD-10-CM | POA: Diagnosis not present

## 2016-03-04 DIAGNOSIS — S20229A Contusion of unspecified back wall of thorax, initial encounter: Secondary | ICD-10-CM | POA: Diagnosis not present

## 2016-03-04 DIAGNOSIS — M4806 Spinal stenosis, lumbar region: Secondary | ICD-10-CM | POA: Diagnosis not present

## 2016-03-04 DIAGNOSIS — M5127 Other intervertebral disc displacement, lumbosacral region: Secondary | ICD-10-CM | POA: Diagnosis not present

## 2016-03-04 MED ORDER — AMOXICILLIN-POT CLAVULANATE 875-125 MG PO TABS: 1.0000 | ORAL_TABLET | Freq: Two times a day (BID) | ORAL | 0 refills | Status: DC

## 2016-03-04 MED FILL — AMOX-CLAV 875-125 MG TABLET: 875-125 | 10 days supply | Qty: 20 | Fill #0

## 2016-03-07 ENCOUNTER — Telehealth: Payer: Self-pay | Admitting: Behavioral Health

## 2016-03-07 NOTE — Telephone Encounter (Signed)
[facility] note received via fax  Call Date: 03/04/16 Time: 11:20 PM   Caller: Lanice Shirts Return number: [phone]  Nurse: Moses Manners, RN  Chief Complaint: Prescription refill or medication request (non symptomatic)  Reason for call: Patient would like to have her medication sent to a different pharmacy.  Related visit to physician within the last 2 weeks: No    Spoke with patient and she requested that her primary pharmacy be listed as [facility]. Info has been updated in the patient's chart.

## 2016-03-09 ENCOUNTER — Ambulatory Visit: Payer: Commercial Managed Care - HMO

## 2016-03-10 ENCOUNTER — Ambulatory Visit (INDEPENDENT_AMBULATORY_CARE_PROVIDER_SITE_OTHER): Payer: Commercial Managed Care - HMO | Admitting: Medical

## 2016-03-10 ENCOUNTER — Ambulatory Visit: Payer: Commercial Managed Care - HMO | Admitting: Family Medicine

## 2016-03-10 ENCOUNTER — Encounter: Payer: Self-pay | Admitting: Medical

## 2016-03-10 VITALS — BP 180/86 | HR 72 | Temp 98.3°F | Ht 65.0 in | Wt 187.0 lb

## 2016-03-10 DIAGNOSIS — S32019D Unspecified fracture of first lumbar vertebra, subsequent encounter for fracture with routine healing: Secondary | ICD-10-CM | POA: Diagnosis not present

## 2016-03-10 DIAGNOSIS — I10 Essential (primary) hypertension: Secondary | ICD-10-CM

## 2016-03-10 MED ORDER — LOSARTAN POTASSIUM 25 MG PO TABS: 25.0000 mg | ORAL_TABLET | Freq: Every day | ORAL | 0 refills | Status: DC

## 2016-03-10 MED ORDER — LOSARTAN POTASSIUM 100 MG PO TABS: 100.0000 mg | ORAL_TABLET | Freq: Every day | ORAL | 0 refills | Status: DC

## 2016-03-10 MED FILL — HYDROCODON-APAP 5-325: 5-325 | 10 days supply | Qty: 40 | Fill #0

## 2016-03-10 MED FILL — LOSARTAN POTASSIUM 25 MG TA: 25 | 30 days supply | Qty: 30 | Fill #0

## 2016-03-10 NOTE — Progress Notes (Addendum)
Subjective:    Patient ID: [name], female    DOB: 1961/01/19, 55 y.o.   MRN: [id number]  HPI  Pt in with some recent blood pressure elevations.   Pt is on norvasc 10 mg a day and  lopressor 100 mg twice daily per our med list. Pt thinks she is on 4 meds . I got lpn to call and verify in light of her high bp today. Pt states [facility] in past made adjustment a year ago or so. Pt was on clonidine in past at other office/just one dose.  Pt since 02-25-2016 had Htn.  Pt used to be on losartan(also was on azor). Pt states  azor dropped her bp too much of a drop in her bp.  But indicated not sure why losartan was stopped. When I talked to her she thinks could tolerate.  Pt had 1:30 appointment with neurosurgeon office. Discussed kypoplasty for back injury. Then they sent her her for her bp level elevation.  Baseline stroke 7 yrs ago. Effected. Left side. No deficits.  Bp has been increased since her back pain. Pt is on vicodin for her back fx.     Review of Systems  Constitutional: Negative for chills, fatigue and fever.  Respiratory: Negative for cough, chest tightness, shortness of breath and wheezing.   Cardiovascular: Negative for chest pain and palpitations.  Gastrointestinal: Negative for abdominal pain.  Musculoskeletal: Positive for back pain.  Neurological: Negative for dizziness, tremors, seizures, syncope, facial asymmetry, speech difficulty, weakness, light-headedness, numbness and headaches.  Psychiatric/Behavioral: Negative for behavioral problems.    Past Medical History:  Diagnosis Date  . Acute right MCA stroke ([place]) 01/27/11   Acute R MCA stroked  with L arm and leg weakness  . Anxiety 1980s   On Klonipin since age 24. Had addiction problem with Xanax which  Was therefore d/c . Seen in the past at [facility].   . Arthritis 04/16/2011  . Blind right eye   . CAD (coronary artery disease) 2007   istory of MI with stent in 2007 by Dr. Chancy Milroy, [facility]. Stent placement by Dr Cleatis Polka   . Depression 03/07/2011  . Fracture of fifth toe, left, closed 01/28/2011   History of left fifth toe proximal phalanx fracture when patient had a stroke (01/2011) and fell. Seen by Dr Doran Durand.    . Fractured toe 01/2011   History of left fifth toe proximal phalanx fracture when patient had a stroke (01/2011) and fell. Seen by Dr Doran Durand.   . Hyperlipidemia 03/07/2011  . Hypertension 03/07/2011  . Incontinence 04/14/2011  . Myocardial infarct, old 2008   stents  . PFO (patent foramen ovale) August 2012   PFO seen on TEE during hospitalization in 01/2011.  Patient to f/u with Dr. Leonie Man, neurology, for enrollment in trial for medical treatment of PFO  . Pneumonia   . Prediabetes 03/07/2011  . Psoriasis 03/07/2011  . Substance abuse    h/o narcotic abuse pt denies as of 01/09/12  . Tobacco abuse 03/07/2011  . Tobacco abuse 03/07/2011   Quit 01/2011      Social History   Social History  . Marital status: Married    Spouse name: dexter  . Number of children: 1  . Years of education: 108   Occupational History  . Disabled    Social History Main Topics  . Smoking status: Current Every Day Smoker    Packs/day: 0.50    Years: 30.00    Types:  Cigarettes    Last attempt to quit: 04/07/2014  . Smokeless tobacco: Never Used     Comment: Trying to quit again.  . Alcohol use No  . Drug use: No  . Sexual activity: Yes    Partners: Male   Other Topics Concern  . Not on file   Social History Narrative   Pt married and lives with husband.     Past Surgical History:  Procedure Laterality Date  . cardiac stent     2008  . EYE SURGERY      Family History  Problem Relation Age of Onset  . Heart disease Mother   . Diabetes Mother   . Heart disease Father   . Diabetes Father   . Cancer Sister 64    ovarian  . Heart disease Brother   . Diabetes Brother   . Heart disease Maternal Grandmother   . Heart disease Maternal Grandfather   . Heart  disease Paternal Grandmother   . Heart disease Paternal Grandfather   . Lung cancer Brother   . Heart disease Brother     Allergies  Allergen Reactions  . Azor [Amlodipine-Olmesartan] Other (See Comments)    Drops blood pressure too low (takes plain amlodipine at home July 2015)  . Trintellix [Vortioxetine] Itching    Current Outpatient Prescriptions on File Prior to Visit  Medication Sig Dispense Refill  . albuterol (PROVENTIL HFA;VENTOLIN HFA) 108 (90 BASE) MCG/ACT inhaler Inhale 2 puffs into the lungs every 4 (four) hours as needed for wheezing or shortness of breath (or coughing). 1 Inhaler 0  . allopurinol (ZYLOPRIM) 300 MG tablet Take 1 tablet (300 mg total) by mouth daily. 30 tablet 5  . amLODipine (NORVASC) 5 MG tablet Take 2 tablets (10 mg total) by mouth daily. 180 tablet 1  . amoxicillin-clavulanate (AUGMENTIN) 875-125 MG tablet Take 1 tablet by mouth 2 (two) times daily. 20 tablet 0  . atropine 1 % ophthalmic solution     . clopidogrel (PLAVIX) 75 MG tablet Take 1 tablet (75 mg total) by mouth daily. 90 tablet 3  . diclofenac (VOLTAREN) 75 MG EC tablet Take 1 tablet (75 mg total) by mouth 2 (two) times daily. 14 tablet 0  . eszopiclone (LUNESTA) 2 MG TABS tablet Take 1 tablet (2 mg total) by mouth at bedtime as needed. for sleep 30 tablet 0  . fenofibrate 160 MG tablet Take 1 tablet (160 mg total) by mouth daily. 90 tablet 1  . LORazepam (ATIVAN) 1 MG tablet TAKE 1 TABLET AT BEDTIME AS NEEDED FOR ANXIETY  OR FOR SLEEP 90 tablet 1  . lovastatin (MEVACOR) 20 MG tablet Take 1 tablet (20 mg total) by mouth at bedtime. 90 tablet 3  . metoprolol (LOPRESSOR) 100 MG tablet Take 1 tablet (100 mg total) by mouth 2 (two) times daily. 180 tablet 1  . nicotine (NICOTROL) 10 MG inhaler Inhale 1 cartridge (1 continuous puffing total) into the lungs as needed for smoking cessation. 42 each 0  . potassium chloride (MICRO-K) 10 MEQ CR capsule TAKE 2 CAPSULES BY MOUTH DAILY 60 capsule 3  .  prednisoLONE acetate (PRED FORTE) 1 % ophthalmic suspension 1 drop 4 (four) times daily.    Marland Kitchen tiZANidine (ZANAFLEX) 4 MG tablet Take 1 tablet (4 mg total) by mouth every 8 (eight) hours as needed for muscle spasms. 60 tablet 1   No current facility-administered medications on file prior to visit.     BP (!) 180/86   Pulse 72  Temp 98.3 F (36.8 C) (Oral)   Ht 5\' 5"  (1.651 m)   Wt 187 lb (84.8 kg)   SpO2 100%   BMI 31.12 kg/m       Objective:   Physical Exam  General Mental Status- Alert. General Appearance- Not in acute distress.   Skin General: Color- Normal Color. Moisture- Normal Moisture.  Neck Carotid Arteries- Normal color. Moisture- Normal Moisture. No carotid bruits. No JVD.  Chest and Lung Exam Auscultation: Breath Sounds:-Normal.  Cardiovascular Auscultation:Rythm- Regular. Murmurs & Other Heart Sounds:Auscultation of the heart reveals- No Murmurs.  Abdomen Inspection:-Inspeection Normal. Palpation/Percussion:Note:No mass. Palpation and Percussion of the abdomen reveal- Non Tender, Non Distended + BS, no rebound or guarding.  Neurologic Cranial Nerve exam:- CN III-XII intact(No nystagmus), symmetric smile. Drift Test:- No drift. Romberg Exam:- Negative.  Heal to Toe Gait exam:-poor. Hx of stoke. Abnormal gate history. Finger to Nose:- Normal/Intact Strength:- 5/5 equal and symmetric strength both upper and lower extremities.       Assessment & Plan:  Very high blood pressure recently but normal neurologic exam(no changes from baseline/hx of stroke).  Continue your norvasc 10 mg a day and metoprolol 100 mg twice daily. I will add losartan 25 mg q day. I want you to follow up on Monday with me for bp check. Check bp over weekend and document readings.  If bp readings exceeding today reading 180/90 or any neurologic or cardiac signs or symptoms then ED evaluation.  Note azor dropped pt blood pressure to low. But does not describe allergic reaction. So  rx losartan. Pt was on this before and does not report similar reaction.  Discussed med option and choice with supervising MD.  Considered clonidine.But concern for rebound htn, hctz considered but gout concern. Pharmacist raised concern about losartan. Discussed with pcp and decided to give lower dose losartan 25 mg. Cancelled 100 mg dose.    Meredeth Furber, Percell Miller, PA-C

## 2016-03-10 NOTE — Patient Instructions (Addendum)
Very high blood pressure recently but normal neurologic exam(no changes from baseline/hx of stroke).  Continue your norvasc 10 mg a day and metoprolol 100 mg twice daily. I will losartan 25 mg a day. I want you to follow up on Monday with me for bp check. Check bp over weekend and document readings.  If bp readings exceeding today reading 180/90 or any neurologic or cardiac signs or symptoms then ED evaluation.  See A and P.

## 2016-03-10 NOTE — Addendum Note (Signed)
Addended by: Anabel Halon on: 03/10/2016 04:50 PM   Modules accepted: Orders

## 2016-03-11 ENCOUNTER — Telehealth: Payer: Self-pay | Admitting: Family Medicine

## 2016-03-11 NOTE — Telephone Encounter (Signed)
No charge. 

## 2016-03-11 NOTE — Telephone Encounter (Signed)
Patient was 15 minutes late for appointment on 9/21 and was told to reschedule but refused and was eventually scheduled with another provider. Charge or No Charge?

## 2016-03-14 ENCOUNTER — Other Ambulatory Visit: Payer: Self-pay | Admitting: Physical Medicine and Rehabilitation

## 2016-03-14 ENCOUNTER — Ambulatory Visit (INDEPENDENT_AMBULATORY_CARE_PROVIDER_SITE_OTHER): Payer: Commercial Managed Care - HMO | Admitting: Medical

## 2016-03-14 ENCOUNTER — Encounter: Payer: Self-pay | Admitting: Medical

## 2016-03-14 VITALS — BP 170/80 | HR 62 | Temp 98.8°F | Ht 65.0 in | Wt 184.0 lb

## 2016-03-14 DIAGNOSIS — I10 Essential (primary) hypertension: Secondary | ICD-10-CM

## 2016-03-14 DIAGNOSIS — Z23 Encounter for immunization: Secondary | ICD-10-CM

## 2016-03-14 DIAGNOSIS — S32000A Wedge compression fracture of unspecified lumbar vertebra, initial encounter for closed fracture: Secondary | ICD-10-CM

## 2016-03-14 NOTE — Progress Notes (Signed)
Pre visit review using our clinic tool,if applicable. No additional management support is needed unless otherwise documented below in the visit note.  

## 2016-03-14 NOTE — Addendum Note (Signed)
Addended by: Vernie Shanks E on: 03/14/2016 11:03 AM   Modules accepted: Orders

## 2016-03-14 NOTE — Patient Instructions (Addendum)
Your bp came down some but still not ideal. I want you to incremintally increase your losartan now to 50 mg a day. Get otc blood pressure and check daily. If not coming down to 140/90 then by Friday will advise increasing to 100 mg a day.(Will rx higher dose tab depnding on how pt does.)  Follow later this week or early next week to notify us of bp levels and which doe to send to your pharmacy.(would need office visit if bp not eventually coming down to 140/90 range.)  If any neurologic signs or symptoms that occur while bp on high side the ED evaluation.

## 2016-03-14 NOTE — Progress Notes (Signed)
Subjective:    [name], female    DOB: Jun 07, 1961, 55 y.o.   MRN: [id number]  HPI   Pt in for follow up on her blood pressure.   Pt bp was high on last visit. She has no cardiac or neurologic signs or symptoms today. Here for bp check to see how responded to low dose losartan 25 mg. No side effects reported. No dramatic drop in bp.  Below is my summary from last visit.   Very high blood pressure recently but normal neurologic exam(no changes from baseline/hx of stroke).  Continue your norvasc 10 mg a day and metoprolol 100 mg twice daily. I will add losartan 25 mg q day. I want you to follow up on Monday with me for bp check. Check bp over weekend and document readings.  If bp readings exceeding today reading 180/90 or any neurologic or cardiac signs or symptoms then ED evaluation.  Note azor dropped pt blood pressure to low. But does not describe allergic reaction. So rx losartan. Pt was on this before and does not report similar reaction.  Discussed med option and choice with supervising MD.  Considered clonidine.But concern for rebound htn, hctz considered but gout concern. Pharmacist raised concern about losartan. Discussed with pcp and decided to give lower dose losartan 25 mg. Cancelled 100 mg dose.  Per pt when later went down to get losarta pt stated pharmacist stated speculated she could probably have taken 100 mg dose.     Review of Systems  Constitutional: Negative for chills, fatigue and fever.  Respiratory: Negative for cough, chest tightness, shortness of breath and wheezing.   Cardiovascular: Negative for chest pain and palpitations.  Gastrointestinal: Negative for abdominal pain and anal bleeding.  Musculoskeletal: Negative for back pain and gait problem.  Skin: Negative for pallor and rash.  Neurological: Negative for dizziness, speech difficulty, weakness, numbness and headaches.  Hematological: Negative for adenopathy. Does not bruise/bleed  easily.  Psychiatric/Behavioral: Negative for behavioral problems and confusion.    Past Medical History:  Diagnosis Date  . Acute right MCA stroke ([place]) 01/27/11   Acute R MCA stroked  with L arm and leg weakness  . Anxiety 1980s   On Klonipin since age 89. Had addiction problem with Xanax which  Was therefore d/c . Seen in the past at [facility].   . Arthritis 04/16/2011  . Blind right eye   . CAD (coronary artery disease) 2007   istory of MI with stent in 2007 by Dr. Chancy Milroy, [facility]. Stent placement by Dr Cleatis Polka   . Depression 03/07/2011  . Fracture of fifth toe, left, closed 01/28/2011   History of left fifth toe proximal phalanx fracture when patient had a stroke (01/2011) and fell. Seen by Dr Doran Durand.    . Fractured toe 01/2011   History of left fifth toe proximal phalanx fracture when patient had a stroke (01/2011) and fell. Seen by Dr Doran Durand.   . Hyperlipidemia 03/07/2011  . Hypertension 03/07/2011  . Incontinence 04/14/2011  . Myocardial infarct, old 2008   stents  . PFO (patent foramen ovale) August 2012   PFO seen on TEE during hospitalization in 01/2011.  Patient to f/u with Dr. Leonie Man, neurology, for enrollment in trial for medical treatment of PFO  . Pneumonia   . Prediabetes 03/07/2011  . Psoriasis 03/07/2011  . Substance abuse    h/o narcotic abuse pt denies as of 01/09/12  . Tobacco abuse 03/07/2011  . Tobacco abuse 03/07/2011  Quit 01/2011      Social History   Social History  . Marital status: Married    Spouse name: dexter  . Number of children: 1  . Years of education: 30   Occupational History  . Disabled    Social History Main Topics  . Smoking status: Current Every Day Smoker    Packs/day: 0.50    Years: 30.00    Types: Cigarettes    Last attempt to quit: 04/07/2014  . Smokeless tobacco: Never Used     Comment: Trying to quit again.  . Alcohol use No  . Drug use: No  . Sexual activity: Yes    Partners: Male   Other Topics Concern    . Not on file   Social History Narrative   Pt married and lives with husband.     Past Surgical History:  Procedure Laterality Date  . cardiac stent     2008  . EYE SURGERY      Family History  Problem Relation Age of Onset  . Heart disease Mother   . Diabetes Mother   . Heart disease Father   . Diabetes Father   . Cancer Sister 33    ovarian  . Heart disease Brother   . Diabetes Brother   . Heart disease Maternal Grandmother   . Heart disease Maternal Grandfather   . Heart disease Paternal Grandmother   . Heart disease Paternal Grandfather   . Lung cancer Brother   . Heart disease Brother     Allergies  Allergen Reactions  . Azor [Amlodipine-Olmesartan] Other (See Comments)    Drops blood pressure too low (takes plain amlodipine at home July 2015)  . Trintellix [Vortioxetine] Itching    Current Outpatient Prescriptions on File Prior to Visit  Medication Sig Dispense Refill  . albuterol (PROVENTIL HFA;VENTOLIN HFA) 108 (90 BASE) MCG/ACT inhaler Inhale 2 puffs into the lungs every 4 (four) hours as needed for wheezing or shortness of breath (or coughing). 1 Inhaler 0  . allopurinol (ZYLOPRIM) 300 MG tablet Take 1 tablet (300 mg total) by mouth daily. 30 tablet 5  . amLODipine (NORVASC) 5 MG tablet Take 2 tablets (10 mg total) by mouth daily. 180 tablet 1  . amoxicillin-clavulanate (AUGMENTIN) 875-125 MG tablet Take 1 tablet by mouth 2 (two) times daily. 20 tablet 0  . atropine 1 % ophthalmic solution     . clopidogrel (PLAVIX) 75 MG tablet Take 1 tablet (75 mg total) by mouth daily. 90 tablet 3  . diclofenac (VOLTAREN) 75 MG EC tablet Take 1 tablet (75 mg total) by mouth 2 (two) times daily. 14 tablet 0  . eszopiclone (LUNESTA) 2 MG TABS tablet Take 1 tablet (2 mg total) by mouth at bedtime as needed. for sleep 30 tablet 0  . fenofibrate 160 MG tablet Take 1 tablet (160 mg total) by mouth daily. 90 tablet 1  . LORazepam (ATIVAN) 1 MG tablet TAKE 1 TABLET AT BEDTIME  AS NEEDED FOR ANXIETY  OR FOR SLEEP 90 tablet 1  . losartan (COZAAR) 25 MG tablet Take 1 tablet (25 mg total) by mouth daily. Let pt know we are giving low dose of losartan. Discussed with her pcp. 30 tablet 0  . lovastatin (MEVACOR) 20 MG tablet Take 1 tablet (20 mg total) by mouth at bedtime. 90 tablet 3  . metoprolol (LOPRESSOR) 100 MG tablet Take 1 tablet (100 mg total) by mouth 2 (two) times daily. 180 tablet 1  . prednisoLONE acetate (PRED  FORTE) 1 % ophthalmic suspension 1 drop 4 (four) times daily.    Marland Kitchen tiZANidine (ZANAFLEX) 4 MG tablet Take 1 tablet (4 mg total) by mouth every 8 (eight) hours as needed for muscle spasms. 60 tablet 1  . potassium chloride (MICRO-K) 10 MEQ CR capsule TAKE 2 CAPSULES BY MOUTH DAILY 60 capsule 3   No current facility-administered medications on file prior to visit.     BP (!) 183/80   Pulse 62   Temp 98.8 F (37.1 C) (Oral)   Ht 5\' 5"  (1.651 m)   Wt 184 lb (83.5 kg)   SpO2 100%   BMI 30.62 kg/m       Objective:   Physical Exam  General Mental Status- Alert. General Appearance- Not in acute distress.   Skin General: Color- Normal Color. Moisture- Normal Moisture.  Neck Carotid Arteries- Normal color. Moisture- Normal Moisture. No carotid bruits. No JVD.  Chest and Lung Exam Auscultation: Breath Sounds:-Normal.  Cardiovascular Auscultation:Rythm- Regular. Murmurs & Other Heart Sounds:Auscultation of the heart reveals- No Murmurs.  Abdomen Inspection:-Inspeection Normal. Palpation/Percussion:Note:No mass. Palpation and Percussion of the abdomen reveal- Non Tender, Non Distended + BS, no rebound or guarding.  Neurologic Cranial Nerve exam:- CN III-XII intact(No nystagmus), symmetric smile. Drift Test:- No drift. Romberg Exam:- Negative.  Heal to Toe Gait exam:-poor. Hx of stoke. Abnormal gate history. Finger to Nose:- Normal/Intact Strength:- 5/5 equal and symmetric strength both upper and lower extremities.       Assessment & Plan:  Your bp came down some but still not ideal. I want you to incremintally increase your losartan now to 50 mg a day. Get otc blood pressure and check daily. If not coming down to 140/90 then by Friday will advise increasing to 100 mg a day.(Will rx higher dose tab depnding on how pt does.)  Follow later this week or early next week to notify us of bp levels and which doe to send to your pharmacy. (would need office visit if bp not eventually coming down to 140/90 range.)  If any neurologic signs or symptoms that occur while bp on high side the ED evaluation.  Kinnick Maus, Percell Miller, PA-C

## 2016-03-16 ENCOUNTER — Ambulatory Visit
Admission: RE | Admit: 2016-03-16 | Discharge: 2016-03-16 | Disposition: A | Discharge status: Home / Self Care | Payer: Commercial Managed Care - HMO | Source: Ambulatory Visit | Attending: Physical Medicine and Rehabilitation | Admitting: Physical Medicine and Rehabilitation

## 2016-03-16 DIAGNOSIS — S32010A Wedge compression fracture of first lumbar vertebra, initial encounter for closed fracture: Secondary | ICD-10-CM | POA: Diagnosis not present

## 2016-03-16 DIAGNOSIS — S32000A Wedge compression fracture of unspecified lumbar vertebra, initial encounter for closed fracture: Secondary | ICD-10-CM

## 2016-03-16 HISTORY — PX: IR GENERIC HISTORICAL: IMG1180011

## 2016-03-16 NOTE — Consult Note (Signed)
Chief Complaint: Patient was seen in consultation today for  Chief Complaint  Patient presents with  . Advice Only    Consult for Kyphoplasty   at the request of [place]  Referring Physician(s): [place]  Supervising Physician: Marybelle Killings  Patient Status: Outpatient  History of Present Illness: Sandra [name] is a 55 y.o. female who fell 4 weeks ago and developed acute back pain without numbness to her lower extremities. She was worked up and was found to have a severe L1 compression fracture. She underwent radiographs and an MRI. Presently, she has no weakness or numbness in her lower extremities. She denies loss of bladder or bowel function. She does have a history of low back pain and radiculopathy, which as described above, is absent at this time., tobacco use, and gout. She is being treated with narcotics.  Past Medical History:  Diagnosis Date  . Acute right MCA stroke ([place]) 01/27/11   Acute R MCA stroked  with L arm and leg weakness  . Anxiety 1980s   On Klonipin since age 69. Had addiction problem with Xanax which  Was therefore d/c . Seen in the past at [facility].   . Arthritis 04/16/2011  . Blind right eye   . CAD (coronary artery disease) 2007   istory of MI with stent in 2007 by Dr. Chancy Milroy, [facility]. Stent placement by Dr Cleatis Polka   . Depression 03/07/2011  . Fracture of fifth toe, left, closed 01/28/2011   History of left fifth toe proximal phalanx fracture when patient had a stroke (01/2011) and fell. Seen by Dr Doran Durand.    . Fractured toe 01/2011   History of left fifth toe proximal phalanx fracture when patient had a stroke (01/2011) and fell. Seen by Dr Doran Durand.   . Hyperlipidemia 03/07/2011  . Hypertension 03/07/2011  . Incontinence 04/14/2011  . Myocardial infarct, old 2008   stents  . PFO (patent foramen ovale) August 2012   PFO seen on TEE during hospitalization in 01/2011.  Patient to f/u with Dr. Leonie Man, neurology, for enrollment in  trial for medical treatment of PFO  . Pneumonia   . Prediabetes 03/07/2011  . Psoriasis 03/07/2011  . Substance abuse    h/o narcotic abuse pt denies as of 01/09/12  . Tobacco abuse 03/07/2011  . Tobacco abuse 03/07/2011   Quit 01/2011     Past Surgical History:  Procedure Laterality Date  . cardiac stent     2008  . EYE SURGERY      Allergies: Azor [amlodipine-olmesartan] and Trintellix [vortioxetine]  Medications: Prior to Admission medications   Medication Sig Start Date End Date Taking? Authorizing Provider  allopurinol (ZYLOPRIM) 300 MG tablet Take 1 tablet (300 mg total) by mouth daily. 02/28/16  Yes Yvonne R Lowne Chase, DO  amLODipine (NORVASC) 5 MG tablet Take 2 tablets (10 mg total) by mouth daily. 02/28/16  Yes Yvonne R Lowne Chase, DO  amoxicillin-clavulanate (AUGMENTIN) 875-125 MG tablet Take 1 tablet by mouth 2 (two) times daily. 03/04/16  Yes Rosalita Chessman Chase, DO  atropine 1 % ophthalmic solution  03/20/15  Yes Historical Provider, MD  clopidogrel (PLAVIX) 75 MG tablet Take 1 tablet (75 mg total) by mouth daily. 02/28/16  Yes Yvonne R Lowne Chase, DO  eszopiclone (LUNESTA) 2 MG TABS tablet Take 1 tablet (2 mg total) by mouth at bedtime as needed. for sleep 08/05/15  Yes Debbrah Alar, NP  fenofibrate 160 MG tablet Take 1 tablet (160 mg total) by mouth  daily. 02/28/16  Yes Yvonne R Lowne Chase, DO  LORazepam (ATIVAN) 1 MG tablet TAKE 1 TABLET AT BEDTIME AS NEEDED FOR ANXIETY  OR FOR SLEEP 02/28/16  Yes Yvonne R Lowne Chase, DO  losartan (COZAAR) 25 MG tablet Take 1 tablet (25 mg total) by mouth daily. Let pt know we are giving low dose of losartan. Discussed with her pcp. 03/10/16  Yes Mackie Pai, PA-C  lovastatin (MEVACOR) 20 MG tablet Take 1 tablet (20 mg total) by mouth at bedtime. 02/28/16  Yes Yvonne R Lowne Chase, DO  metoprolol (LOPRESSOR) 100 MG tablet Take 1 tablet (100 mg total) by mouth 2 (two) times daily. 02/28/16  Yes Yvonne R Lowne Chase, DO  potassium  chloride (MICRO-K) 10 MEQ CR capsule TAKE 2 CAPSULES BY MOUTH DAILY 01/13/16  Yes Yvonne R Lowne Chase, DO  prednisoLONE acetate (PRED FORTE) 1 % ophthalmic suspension 1 drop 4 (four) times daily.   Yes Historical Provider, MD  tiZANidine (ZANAFLEX) 4 MG tablet Take 1 tablet (4 mg total) by mouth every 8 (eight) hours as needed for muscle spasms. 05/05/15  Yes Dene Gentry, MD  albuterol (PROVENTIL HFA;VENTOLIN HFA) 108 (90 BASE) MCG/ACT inhaler Inhale 2 puffs into the lungs every 4 (four) hours as needed for wheezing or shortness of breath (or coughing). Patient not taking: Reported on 03/16/2016 [id number]   Delora Fuel, MD  diclofenac (VOLTAREN) 75 MG EC tablet Take 1 tablet (75 mg total) by mouth 2 (two) times daily. Patient not taking: Reported on 03/16/2016 02/20/16   Noemi Chapel, MD     Family History  Problem Relation Age of Onset  . Heart disease Mother   . Diabetes Mother   . Heart disease Father   . Diabetes Father   . Cancer Sister 74    ovarian  . Heart disease Brother   . Diabetes Brother   . Heart disease Maternal Grandmother   . Heart disease Maternal Grandfather   . Heart disease Paternal Grandmother   . Heart disease Paternal Grandfather   . Lung cancer Brother   . Heart disease Brother     Social History   Social History  . Marital status: Married    Spouse name: dexter  . Number of children: 1  . Years of education: 36   Occupational History  . Disabled    Social History Main Topics  . Smoking status: Current Every Day Smoker    Packs/day: 0.50    Years: 30.00    Types: Cigarettes    Last attempt to quit: 04/07/2014  . Smokeless tobacco: Never Used     Comment: Trying to quit again.  . Alcohol use No  . Drug use: No  . Sexual activity: Yes    Partners: Male   Other Topics Concern  . Not on file   Social History Narrative   Pt married and lives with husband.     Review of Systems: A 12 point ROS discussed and pertinent positives are indicated  in the HPI above.  All other systems are negative.  Review of Systems  Vital Signs: BP (!) 186/94 (BP Location: Right Arm, Patient Position: Sitting, Cuff Size: Normal)   Pulse 66   Temp 97.9 F (36.6 C) (Oral)   Resp 15   Ht 5' (1.524 m)   Wt 187 lb (84.8 kg)   SpO2 97%   BMI 36.52 kg/m   Physical Exam  Constitutional: She is oriented to person, place, and time. She appears well-developed  and well-nourished.  Musculoskeletal:  She is focally tender in her upper lumbar spine at the midline.  Neurological: She is alert and oriented to person, place, and time.  She is neurologically intact. Lower extremity strength is 5 out of 5. Reflexes are 2+ bilaterally. No clonus. Sensation is intact in her lower extremities.    Mallampati Score:     Imaging: No results found. I have reviewed the plain radiographs and her MRI. There is an L1 compression fracture with severe loss of height, approaching a vertebral plana. There is also posterior element involvement. There is some splaying of the spinous processes with edema. There is no widening of the pedicle and this is not a overt burst fracture. There is also severe retropulsion with slight impaction on the conus.   Labs:  CBC:  Recent Labs  03/26/15 0400 05/29/15 0952 02/25/16 1449 03/02/16 0825  WBC 12.5* 10.2 15.0* 14.0*  HGB 15.2* 13.1 13.4 14.3  HCT 45.9 40.3 40.5 42.3  PLT 237 211.0 346.0 309.0    COAGS: No results for input(s): INR, APTT in the last 8760 hours.  BMP:  Recent Labs  03/26/15 0400 03/27/15 1030 05/29/15 0952 02/25/16 1449  NA 142 142 145 141  K 3.1* 4.2 4.0 4.8  CL 108 107 111 109  CO2 25 29 28 26   GLUCOSE 191* 138* 101* 77  BUN 15 12 13 21   CALCIUM 9.0 9.4 9.0 9.3  CREATININE 0.56 0.56 0.54 0.71  GFRNONAA >60  --   --   --   GFRAA >60  --   --   --     LIVER FUNCTION TESTS:  Recent Labs  03/27/15 1030 05/29/15 0952 02/25/16 1449  BILITOT 0.4 0.3 0.2  AST 9 9 11   ALT 12 9 9     ALKPHOS 103 104 70  PROT 6.7 6.2 7.0  ALBUMIN 3.9 3.8 4.4    TUMOR MARKERS: No results for input(s): AFPTM, CEA, CA199, CHROMGRNA in the last 8760 hours.  Assessment and Plan:  Ms. Mozqueda has an advanced L1 compression fracture as described that she developed one month ago from a fall. She is completely neurologically intact, and I suspect that this fracture is largely stable. There is however posterior element involvement raising the possibility of slight instability. There is also significant retropulsion with impaction on the cord. She is neurologically intact without upper motor neuron symptoms such as clonus. I am recommending neurosurgical consultation for further management. I don't believe vertebroplasty or kyphoplasty will be helpful for her. The pain from the fracture should subside within 3 weeks, as she is quite young without obvious osteoporosis.  Thank you for this interesting consult.  I greatly enjoyed meeting Siedah Gerloff and look forward to participating in their care.  A copy of this report was sent to the requesting provider on this date.  Electronically Signed: Somaya Grassi, ART A 03/16/2016, 12:29 PM   I spent a total of  40 Minutes   in face to face in clinical consultation, greater than 50% of which was counseling/coordinating care for L1 compression fracture.

## 2016-03-17 ENCOUNTER — Other Ambulatory Visit: Payer: Self-pay | Admitting: Family Medicine

## 2016-03-18 NOTE — Telephone Encounter (Signed)
Last seen 02/25/16 and filled 05/05/15 #60 with 1 rf   Please advise     KP

## 2016-03-22 ENCOUNTER — Telehealth: Payer: Self-pay | Admitting: Family Medicine

## 2016-03-22 DIAGNOSIS — S32010A Wedge compression fracture of first lumbar vertebra, initial encounter for closed fracture: Secondary | ICD-10-CM | POA: Diagnosis not present

## 2016-03-22 DIAGNOSIS — S32019D Unspecified fracture of first lumbar vertebra, subsequent encounter for fracture with routine healing: Secondary | ICD-10-CM | POA: Diagnosis not present

## 2016-03-22 DIAGNOSIS — M4316 Spondylolisthesis, lumbar region: Secondary | ICD-10-CM | POA: Diagnosis not present

## 2016-03-22 DIAGNOSIS — M4856XA Collapsed vertebra, not elsewhere classified, lumbar region, initial encounter for fracture: Secondary | ICD-10-CM | POA: Diagnosis not present

## 2016-03-22 MED ORDER — LOSARTAN POTASSIUM 100 MG PO TABS: 100.0000 mg | ORAL_TABLET | Freq: Every day | ORAL | 2 refills | Status: DC

## 2016-03-22 NOTE — Telephone Encounter (Signed)
Patient's husband is calling to let the doctor know that the patient is almost out of her medication. He says that Sandra [name] wanted to up the dosage of the medication to 100MG. Please advise.   Pharmacy: [facility].

## 2016-03-22 NOTE — Telephone Encounter (Signed)
Pharmacy: [id number] [place], [place] [address]

## 2016-03-22 NOTE — Telephone Encounter (Signed)
Patient did not know the name of the medication at the time. He thought it may be losartan but was not sure.

## 2016-03-22 NOTE — Telephone Encounter (Signed)
Which medication ?

## 2016-03-22 NOTE — Telephone Encounter (Signed)
Discussed with the patient's husband and he stated that Percell Miller placed the patient on Losartan 25 mg and advised to gradually increase to 100 mg if her BP does not improve. He said she had an apt today due to a back injury and her BP was 178/98. Per Dr.Lowne send in a script for Losartan 100 mg. Rx faxed and she has been scheduled with Dr.Wendling tomorrow per husband's request.     KP

## 2016-03-23 ENCOUNTER — Encounter: Payer: Self-pay | Admitting: Family Medicine

## 2016-03-23 ENCOUNTER — Ambulatory Visit (INDEPENDENT_AMBULATORY_CARE_PROVIDER_SITE_OTHER): Payer: Commercial Managed Care - HMO | Admitting: Family Medicine

## 2016-03-23 VITALS — BP 170/68 | HR 61 | Temp 98.3°F | Ht 65.0 in | Wt 185.6 lb

## 2016-03-23 DIAGNOSIS — R0683 Snoring: Secondary | ICD-10-CM

## 2016-03-23 DIAGNOSIS — I10 Essential (primary) hypertension: Secondary | ICD-10-CM

## 2016-03-23 LAB — MICROALBUMIN / CREATININE URINE RATIO
Creatinine,U: 258.9 mg/dL
Microalb Creat Ratio: 0.6 mg/g (ref 0.0–30.0)
Microalb, Ur: 1.6 mg/dL (ref 0.0–1.9)

## 2016-03-23 LAB — BASIC METABOLIC PANEL
BUN: 13 mg/dL (ref 6–23)
CHLORIDE: 108 meq/L (ref 96–112)
CO2: 25 meq/L (ref 19–32)
CREATININE: 0.75 mg/dL (ref 0.40–1.20)
Calcium: 9.4 mg/dL (ref 8.4–10.5)
GFR: 85.38 mL/min (ref >60.00)
GLUCOSE: 102 mg/dL — AB (ref 70–99)
POTASSIUM: 4.3 meq/L (ref 3.5–5.1)
Sodium: 144 mEq/L (ref 135–145)

## 2016-03-23 NOTE — Patient Instructions (Signed)
Clean up the diet!  Try to exercise daily.

## 2016-03-23 NOTE — Progress Notes (Signed)
Pre visit review using our clinic review tool, if applicable. No additional management support is needed unless otherwise documented below in the visit note. 

## 2016-03-23 NOTE — Progress Notes (Signed)
Chief Complaint  Patient presents with  . Hypertension   Subjective Sandra [name] is a 55 y.o. female who presents for hypertension follow up. She does monitor home blood pressures. Blood pressures ranging from 180's over 80's. She is compliant with medications- Losartan (75 mg, 100 mg daily starting yesterday but has not picked up rx yet), Metoprolol 100 mg BID, and Norvasc 10 mg daily. She does not take a diuretic due to her hx of gout. Patient has these side effects of medication: none She is not adhering to a low sodium and low fat diet. Current exercise: None  She does snore, as her husband confirms, and has never been evaluated for sleep apnea.    Past Medical History:  Diagnosis Date  . Acute right MCA stroke ([place]) 01/27/11   Acute R MCA stroked  with L arm and leg weakness  . Anxiety 1980s   On Klonipin since age 2. Had addiction problem with Xanax which  Was therefore d/c . Seen in the past at [facility].   . Arthritis 04/16/2011  . Blind right eye   . CAD (coronary artery disease) 2007   istory of MI with stent in 2007 by Dr. Chancy Milroy, [facility]. Stent placement by Dr Cleatis Polka   . Depression 03/07/2011  . Fracture of fifth toe, left, closed 01/28/2011   History of left fifth toe proximal phalanx fracture when patient had a stroke (01/2011) and fell. Seen by Dr Doran Durand.    . Fractured toe 01/2011   History of left fifth toe proximal phalanx fracture when patient had a stroke (01/2011) and fell. Seen by Dr Doran Durand.   . Hyperlipidemia 03/07/2011  . Hypertension 03/07/2011  . Incontinence 04/14/2011  . Myocardial infarct, old 2008   stents  . PFO (patent foramen ovale) August 2012   PFO seen on TEE during hospitalization in 01/2011.  Patient to f/u with Dr. Leonie Man, neurology, for enrollment in trial for medical treatment of PFO  . Pneumonia   . Prediabetes 03/07/2011  . Psoriasis 03/07/2011  . Substance abuse    h/o narcotic abuse pt denies as of 01/09/12  . Tobacco abuse  03/07/2011  . Tobacco abuse 03/07/2011   Quit 01/2011    Family History  Problem Relation Age of Onset  . Heart disease Mother   . Diabetes Mother   . Heart disease Father   . Diabetes Father   . Cancer Sister 48    ovarian  . Heart disease Brother   . Diabetes Brother   . Heart disease Maternal Grandmother   . Heart disease Maternal Grandfather   . Heart disease Paternal Grandmother   . Heart disease Paternal Grandfather   . Lung cancer Brother   . Heart disease Brother    Medications Current Outpatient Prescriptions on File Prior to Visit  Medication Sig Dispense Refill  . allopurinol (ZYLOPRIM) 300 MG tablet Take 1 tablet (300 mg total) by mouth daily. 30 tablet 5  . amLODipine (NORVASC) 5 MG tablet Take 2 tablets (10 mg total) by mouth daily. 180 tablet 1  . atropine 1 % ophthalmic solution     . clopidogrel (PLAVIX) 75 MG tablet Take 1 tablet (75 mg total) by mouth daily. 90 tablet 3  . eszopiclone (LUNESTA) 2 MG TABS tablet Take 1 tablet (2 mg total) by mouth at bedtime as needed. for sleep 30 tablet 0  . fenofibrate 160 MG tablet Take 1 tablet (160 mg total) by mouth daily. 90 tablet 1  . LORazepam (  ATIVAN) 1 MG tablet TAKE 1 TABLET AT BEDTIME AS NEEDED FOR ANXIETY  OR FOR SLEEP 90 tablet 1  . losartan (COZAAR) 100 MG tablet Take 1 tablet (100 mg total) by mouth daily. 30 tablet 2  . lovastatin (MEVACOR) 20 MG tablet Take 1 tablet (20 mg total) by mouth at bedtime. 90 tablet 3  . metoprolol (LOPRESSOR) 100 MG tablet Take 1 tablet (100 mg total) by mouth 2 (two) times daily. 180 tablet 1  . potassium chloride (MICRO-K) 10 MEQ CR capsule TAKE 2 CAPSULES BY MOUTH DAILY 60 capsule 3  . prednisoLONE acetate (PRED FORTE) 1 % ophthalmic suspension 1 drop 4 (four) times daily.    Marland Kitchen tiZANidine (ZANAFLEX) 4 MG tablet TAKE 2 TABLETS BY MOUTH TWICE DAILY 270 tablet 0   Allergies Allergies  Allergen Reactions  . Azor [Amlodipine-Olmesartan] Other (See Comments)    Drops blood  pressure too low (takes plain amlodipine at home July 2015)  . Trintellix [Vortioxetine] Itching    Review of Systems Cardiovascular:  no exercise intolerance, no chest pain, no palpitations Respiratory:  no cough or shortness of breath  Exam BP (!) 170/68 (BP Location: Right Arm, Patient Position: Sitting, Cuff Size: Normal)   Pulse 61   Temp 98.3 F (36.8 C) (Oral)   Ht 5' 5"  (1.651 m)   Wt 185 lb 9.6 oz (84.2 kg)   SpO2 98%   BMI 30.89 kg/m  General:  well developed, well nourished, in no apparent distress Skin:  warm, no pallor or diaphoresis Neck: neck supple without adenopathy, thyromegaly, masses, or bruits  Lungs:  clear to auscultation, breath sounds equal bilaterally Cardio:  regular rate and rhythm, no LE edema Psych: well oriented with normal range of affect and appropriate judgment/insight  Essential hypertension - Plan: Microalbumin / creatinine urine ratio, Basic Metabolic Panel (BMET)  Snoring - Plan: Ambulatory referral to Pulmonology  Orders as above. Recheck above given recent start of ARB. Needs to clean up diet, she knows what to do, but needs to institute it.  Check for OSA as underlying cause of HTN. F/u in in 2 weeks to recheck BP. Spironolactone would be my next choice, but it does have a small association with causing gout flare. The patient voiced understanding and agreement to the plan.  [place], DO 03/23/16  8:25 AM

## 2016-03-31 ENCOUNTER — Ambulatory Visit: Payer: Commercial Managed Care - HMO | Admitting: Podiatry

## 2016-04-21 ENCOUNTER — Other Ambulatory Visit: Payer: Self-pay | Admitting: Family Medicine

## 2016-04-21 DIAGNOSIS — I10 Essential (primary) hypertension: Secondary | ICD-10-CM

## 2016-04-22 NOTE — Telephone Encounter (Signed)
Rx was filled on 02/28/16 for Metoptolol #180 tablet with 1 refill  Which covers 6 months. Allopurinol #30 tablets 5 refills which covers pt for 6 months. Refill is not indicated at this time. TL/CMA

## 2016-05-05 DIAGNOSIS — H5340 Unspecified visual field defects: Secondary | ICD-10-CM | POA: Diagnosis not present

## 2016-05-05 DIAGNOSIS — H44003 Unspecified purulent endophthalmitis, bilateral: Secondary | ICD-10-CM | POA: Diagnosis not present

## 2016-05-05 DIAGNOSIS — H35032 Hypertensive retinopathy, left eye: Secondary | ICD-10-CM | POA: Diagnosis not present

## 2016-05-05 DIAGNOSIS — H40052 Ocular hypertension, left eye: Secondary | ICD-10-CM | POA: Diagnosis not present

## 2016-05-05 DIAGNOSIS — H02401 Unspecified ptosis of right eyelid: Secondary | ICD-10-CM | POA: Diagnosis not present

## 2016-05-05 DIAGNOSIS — Z79899 Other long term (current) drug therapy: Secondary | ICD-10-CM | POA: Diagnosis not present

## 2016-05-05 DIAGNOSIS — F17210 Nicotine dependence, cigarettes, uncomplicated: Secondary | ICD-10-CM | POA: Diagnosis not present

## 2016-05-05 DIAGNOSIS — Z7951 Long term (current) use of inhaled steroids: Secondary | ICD-10-CM | POA: Diagnosis not present

## 2016-05-05 DIAGNOSIS — Z9181 History of falling: Secondary | ICD-10-CM | POA: Diagnosis not present

## 2016-05-13 ENCOUNTER — Other Ambulatory Visit: Payer: Self-pay | Admitting: Family Medicine

## 2016-05-13 DIAGNOSIS — I10 Essential (primary) hypertension: Secondary | ICD-10-CM

## 2016-05-17 NOTE — Telephone Encounter (Signed)
Metoprolol filled to pharmacy as requested.  6 month supply of Allopurinol filled in Sept. Pt not yet due for refill.

## 2016-05-18 ENCOUNTER — Institutional Professional Consult (permissible substitution): Payer: Commercial Managed Care - HMO | Admitting: Pulmonary Disease

## 2016-05-19 ENCOUNTER — Other Ambulatory Visit: Payer: Self-pay | Admitting: Family Medicine

## 2016-05-20 NOTE — Telephone Encounter (Signed)
Tizanidine Rx request Denied, Last Rx to pharmacy 03/18/16, #270 with 0 refill - Too Soon for request [also should be in increments of #120 [at [2] tablets, twice daily]/SLS 12/01

## 2016-06-03 ENCOUNTER — Ambulatory Visit (INDEPENDENT_AMBULATORY_CARE_PROVIDER_SITE_OTHER): Payer: Commercial Managed Care - HMO | Admitting: Family Medicine

## 2016-06-03 ENCOUNTER — Encounter: Payer: Self-pay | Admitting: Family Medicine

## 2016-06-03 VITALS — BP 172/80 | HR 50 | Temp 98.2°F | Resp 16 | Ht 65.0 in | Wt 187.8 lb

## 2016-06-03 DIAGNOSIS — E785 Hyperlipidemia, unspecified: Secondary | ICD-10-CM | POA: Diagnosis not present

## 2016-06-03 DIAGNOSIS — I10 Essential (primary) hypertension: Secondary | ICD-10-CM

## 2016-06-03 DIAGNOSIS — Z79899 Other long term (current) drug therapy: Secondary | ICD-10-CM | POA: Diagnosis not present

## 2016-06-03 LAB — COMPREHENSIVE METABOLIC PANEL
ALBUMIN: 4.4 g/dL (ref 3.5–5.2)
ALK PHOS: 74 U/L (ref 39–117)
ALT: 8 U/L (ref 0–35)
AST: 10 U/L (ref 0–37)
BUN: 21 mg/dL (ref 6–23)
CALCIUM: 9.3 mg/dL (ref 8.4–10.5)
CHLORIDE: 109 meq/L (ref 96–112)
CO2: 27 mEq/L (ref 19–32)
CREATININE: 0.81 mg/dL (ref 0.40–1.20)
GFR: 78.07 mL/min (ref >60.00)
Glucose, Bld: 172 mg/dL — ABNORMAL HIGH (ref 70–99)
POTASSIUM: 4.4 meq/L (ref 3.5–5.1)
SODIUM: 142 meq/L (ref 135–145)
TOTAL PROTEIN: 6.8 g/dL (ref 6.0–8.3)
Total Bilirubin: 0.2 mg/dL (ref 0.2–1.2)

## 2016-06-03 LAB — LIPID PANEL
Cholesterol: 138 mg/dL (ref 0–200)
HDL: 31 mg/dL — ABNORMAL LOW (ref >39.00)
LDL Cholesterol: 76 mg/dL (ref 0–99)
NonHDL: 107.12
TRIGLYCERIDES: 155 mg/dL — AB (ref 0.0–149.0)
Total CHOL/HDL Ratio: 4
VLDL: 31 mg/dL (ref 0.0–40.0)

## 2016-06-03 LAB — CBC WITH DIFFERENTIAL/PLATELET
BASOS PCT: 0.3 % (ref 0.0–3.0)
Basophils Absolute: 0 10*3/uL (ref 0.0–0.1)
EOS ABS: 0.5 10*3/uL (ref 0.0–0.7)
EOS PCT: 3.7 % (ref 0.0–5.0)
HEMATOCRIT: 39.4 % (ref 36.0–46.0)
HEMOGLOBIN: 12.9 g/dL (ref 12.0–15.0)
LYMPHS PCT: 25.7 % (ref 12.0–46.0)
Lymphs Abs: 3.3 10*3/uL (ref 0.7–4.0)
MCHC: 32.8 g/dL (ref 30.0–36.0)
MCV: 88.8 fl (ref 78.0–100.0)
Monocytes Absolute: 0.4 10*3/uL (ref 0.1–1.0)
Monocytes Relative: 3.1 % (ref 3.0–12.0)
NEUTROS ABS: 8.6 10*3/uL — AB (ref 1.4–7.7)
Neutrophils Relative %: 67.2 % (ref 43.0–77.0)
PLATELETS: 304 10*3/uL (ref 150.0–400.0)
RBC: 4.44 Mil/uL (ref 3.87–5.11)
RDW: 14.2 % (ref 11.5–15.5)
WBC: 12.8 10*3/uL — AB (ref 4.0–10.5)

## 2016-06-03 MED ORDER — VALSARTAN 160 MG PO TABS: 160.0000 mg | ORAL_TABLET | Freq: Every day | ORAL | 0 refills | Status: DC

## 2016-06-03 NOTE — Progress Notes (Signed)
Pre visit review using our clinic review tool, if applicable. No additional management support is needed unless otherwise documented below in the visit note. 

## 2016-06-03 NOTE — Patient Instructions (Signed)
Hypertension Hypertension, commonly called high blood pressure, is when the force of blood pumping through your arteries is too strong. Your arteries are the blood vessels that carry blood from your heart throughout your body. A blood pressure reading consists of a higher number over a lower number, such as 110/72. The higher number (systolic) is the pressure inside your arteries when your heart pumps. The lower number (diastolic) is the pressure inside your arteries when your heart relaxes. Ideally you want your blood pressure below 120/80. Hypertension forces your heart to work harder to pump blood. Your arteries may become narrow or stiff. Having untreated or uncontrolled hypertension can cause heart attack, stroke, kidney disease, and other problems. What increases the risk? Some risk factors for high blood pressure are controllable. Others are not. Risk factors you cannot control include:  Race. You may be at higher risk if you are African American.  Age. Risk increases with age.  Gender. Men are at higher risk than women before age 45 years. After age 65, women are at higher risk than men. Risk factors you can control include:  Not getting enough exercise or physical activity.  Being overweight.  Getting too much fat, sugar, calories, or salt in your diet.  Drinking too much alcohol. What are the signs or symptoms? Hypertension does not usually cause signs or symptoms. Extremely high blood pressure (hypertensive crisis) may cause headache, anxiety, shortness of breath, and nosebleed. How is this diagnosed? To check if you have hypertension, your health care provider will measure your blood pressure while you are seated, with your arm held at the level of your heart. It should be measured at least twice using the same arm. Certain conditions can cause a difference in blood pressure between your right and left arms. A blood pressure reading that is higher than normal on one occasion does  not mean that you need treatment. If it is not clear whether you have high blood pressure, you may be asked to return on a different day to have your blood pressure checked again. Or, you may be asked to monitor your blood pressure at home for 1 or more weeks. How is this treated? Treating high blood pressure includes making lifestyle changes and possibly taking medicine. Living a healthy lifestyle can help lower high blood pressure. You may need to change some of your habits. Lifestyle changes may include:  Following the DASH diet. This diet is high in fruits, vegetables, and whole grains. It is low in salt, red meat, and added sugars.  Keep your sodium intake below 2,300 mg per day.  Getting at least 30-45 minutes of aerobic exercise at least 4 times per week.  Losing weight if necessary.  Not smoking.  Limiting alcoholic beverages.  Learning ways to reduce stress. Your health care provider may prescribe medicine if lifestyle changes are not enough to get your blood pressure under control, and if one of the following is true:  You are 18-59 years of age and your systolic blood pressure is above 140.  You are 60 years of age or older, and your systolic blood pressure is above 150.  Your diastolic blood pressure is above 90.  You have diabetes, and your systolic blood pressure is over 140 or your diastolic blood pressure is over 90.  You have kidney disease and your blood pressure is above 140/90.  You have heart disease and your blood pressure is above 140/90. Your personal target blood pressure may vary depending on your medical   conditions, your age, and other factors. Follow these instructions at home:  Have your blood pressure rechecked as directed by your health care provider.  Take medicines only as directed by your health care provider. Follow the directions carefully. Blood pressure medicines must be taken as prescribed. The medicine does not work as well when you skip  doses. Skipping doses also puts you at risk for problems.  Do not smoke.  Monitor your blood pressure at home as directed by your health care provider. Contact a health care provider if:  You think you are having a reaction to medicines taken.  You have recurrent headaches or feel dizzy.  You have swelling in your ankles.  You have trouble with your vision. Get help right away if:  You develop a severe headache or confusion.  You have unusual weakness, numbness, or feel faint.  You have severe chest or abdominal pain.  You vomit repeatedly.  You have trouble breathing. This information is not intended to replace advice given to you by your health care provider. Make sure you discuss any questions you have with your health care provider. Document Released: 06/06/2005 Document Revised: 11/12/2015 Document Reviewed: 03/29/2013 Elsevier Interactive Patient Education  2017 [employer]  

## 2016-06-03 NOTE — Progress Notes (Signed)
[name], female    DOB: [birth year]  Age: 54 y.o. MRN: [id number]    Subjective:  Subjective  HPI Sandra [name] presents for for f/u bp and cholesterol.  No complaints.  Her and her husband will quit smoking Jan 2018.    Review of Systems  Constitutional: Negative for activity change, appetite change, fatigue and unexpected weight change.  Respiratory: Negative for cough and shortness of breath.   Cardiovascular: Negative for chest pain and palpitations.  Psychiatric/Behavioral: Negative for behavioral problems and dysphoric mood. The patient is not nervous/anxious.     History Past Medical History:  Diagnosis Date  . Acute right MCA stroke ([place]) 01/27/11   Acute R MCA stroked  with L arm and leg weakness  . Anxiety 1980s   On Klonipin since age 27. Had addiction problem with Xanax which  Was therefore d/c . Seen in the past at [facility].   . Arthritis 04/16/2011  . Blind right eye   . CAD (coronary artery disease) 2007   istory of MI with stent in 2007 by Dr. Chancy Milroy, [facility]. Stent placement by Dr Cleatis Polka   . Depression 03/07/2011  . Fracture of fifth toe, left, closed 01/28/2011   History of left fifth toe proximal phalanx fracture when patient had a stroke (01/2011) and fell. Seen by Dr Doran Durand.    . Fractured toe 01/2011   History of left fifth toe proximal phalanx fracture when patient had a stroke (01/2011) and fell. Seen by Dr Doran Durand.   . Hyperlipidemia 03/07/2011  . Hypertension 03/07/2011  . Incontinence 04/14/2011  . Myocardial infarct, old 2008   stents  . PFO (patent foramen ovale) August 2012   PFO seen on TEE during hospitalization in 01/2011.  Patient to f/u with Dr. Leonie Man, neurology, for enrollment in trial for medical treatment of PFO  . Pneumonia   . Prediabetes 03/07/2011  . Psoriasis 03/07/2011  . Substance abuse    h/o narcotic abuse pt denies as of 01/09/12  . Tobacco abuse 03/07/2011  . Tobacco abuse 03/07/2011   Quit 01/2011     She  has a past surgical history that includes cardiac stent and Eye surgery.   Her family history includes Cancer (age of onset: 65) in her sister; Diabetes in her brother, father, and mother; Heart disease in her brother, brother, father, maternal grandfather, maternal grandmother, mother, paternal grandfather, and paternal grandmother; Lung cancer in her brother.She reports that she has been smoking Cigarettes.  She has a 15.00 pack-year smoking history. She has never used smokeless tobacco. She reports that she does not drink alcohol or use drugs.  Current Outpatient Prescriptions on File Prior to Visit  Medication Sig Dispense Refill  . allopurinol (ZYLOPRIM) 300 MG tablet Take 1 tablet (300 mg total) by mouth daily. 30 tablet 5  . amLODipine (NORVASC) 5 MG tablet Take 2 tablets (10 mg total) by mouth daily. 180 tablet 1  . atropine 1 % ophthalmic solution     . clopidogrel (PLAVIX) 75 MG tablet Take 1 tablet (75 mg total) by mouth daily. 90 tablet 3  . eszopiclone (LUNESTA) 2 MG TABS tablet Take 1 tablet (2 mg total) by mouth at bedtime as needed. for sleep 30 tablet 0  . fenofibrate 160 MG tablet Take 1 tablet (160 mg total) by mouth daily. 90 tablet 1  . LORazepam (ATIVAN) 1 MG tablet TAKE 1 TABLET AT BEDTIME AS NEEDED FOR ANXIETY  OR FOR SLEEP 90 tablet 1  .  lovastatin (MEVACOR) 20 MG tablet Take 1 tablet (20 mg total) by mouth at bedtime. 90 tablet 3  . metoprolol (LOPRESSOR) 100 MG tablet TAKE 1 TABLET BY MOUTH TWICE DAILY 60 tablet 3  . potassium chloride (MICRO-K) 10 MEQ CR capsule TAKE 2 CAPSULES BY MOUTH DAILY 60 capsule 2  . prednisoLONE acetate (PRED FORTE) 1 % ophthalmic suspension 1 drop 4 (four) times daily.    Marland Kitchen tiZANidine (ZANAFLEX) 4 MG tablet TAKE 2 TABLETS BY MOUTH TWICE DAILY 270 tablet 0   No current facility-administered medications on file prior to visit.      Objective:  Objective  Physical Exam  Constitutional: She is oriented to person, place, and time. She appears  well-developed and well-nourished.  HENT:  Head: Normocephalic and atraumatic.  Eyes: Conjunctivae and EOM are normal.  Neck: Normal range of motion. Neck supple. No JVD present. Carotid bruit is not present. No thyromegaly present.  Cardiovascular: Normal rate, regular rhythm and normal heart sounds.   No murmur heard. Pulmonary/Chest: Effort normal and breath sounds normal. No respiratory distress. She has no wheezes. She has no rales. She exhibits no tenderness.  Musculoskeletal: She exhibits no edema.  Neurological: She is alert and oriented to person, place, and time.  Psychiatric: She has a normal mood and affect. Her behavior is normal. Judgment and thought content normal.  Nursing note and vitals reviewed.  BP (!) 172/80 (BP Location: Right Arm, Patient Position: Sitting, Cuff Size: Large)   Pulse (!) 50   Temp 98.2 F (36.8 C) (Oral)   Resp 16   Ht 5' 5"  (1.651 m)   Wt 187 lb 12.8 oz (85.2 kg)   SpO2 98%   BMI 31.25 kg/m  Wt Readings from Last 3 Encounters:  06/03/16 187 lb 12.8 oz (85.2 kg)  03/23/16 185 lb 9.6 oz (84.2 kg)  03/16/16 187 lb (84.8 kg)     Lab Results  Component Value Date   WBC 12.8 (H) 06/03/2016   HGB 12.9 06/03/2016   HCT 39.4 06/03/2016   PLT 304.0 06/03/2016   GLUCOSE 172 (H) 06/03/2016   CHOL 138 06/03/2016   TRIG 155.0 (H) 06/03/2016   HDL 31.00 (L) 06/03/2016   LDLDIRECT 97.0 02/25/2016   LDLCALC 76 06/03/2016   ALT 8 06/03/2016   AST 10 06/03/2016   NA 142 06/03/2016   K 4.4 06/03/2016   CL 109 06/03/2016   CREATININE 0.81 06/03/2016   BUN 21 06/03/2016   CO2 27 06/03/2016   TSH 0.46 05/29/2015   INR 1.03 01/27/2011   HGBA1C 5.8 08/12/2013   MICROALBUR 1.6 03/23/2016    No results found.   Assessment & Plan:  Plan  I have discontinued [name]'s losartan. I am also having her start on valsartan. Additionally, I am having her maintain her prednisoLONE acetate, atropine, eszopiclone, lovastatin, LORazepam, fenofibrate,  clopidogrel, amLODipine, allopurinol, tiZANidine, metoprolol, and potassium chloride.  Meds ordered this encounter  Medications  . valsartan (DIOVAN) 160 MG tablet    Sig: Take 1 tablet (160 mg total) by mouth daily.    Dispense:  30 tablet    Refill:  0    Problem List Items Addressed This Visit      Unprioritized   Hyperlipidemia   Relevant Medications   valsartan (DIOVAN) 160 MG tablet   Other Relevant Orders   Comprehensive metabolic panel (Completed)   Lipid panel (Completed)   CBC with Differential/Platelet (Completed)   Essential hypertension - Primary (Chronic)   Relevant Medications  valsartan (DIOVAN) 160 MG tablet   Other Relevant Orders   Comprehensive metabolic panel (Completed)   Lipid panel (Completed)   CBC with Differential/Platelet (Completed)      Follow-up: Return in about 3 weeks (around 06/24/2016) for hypertension.  Ann Held, DO

## 2016-06-07 ENCOUNTER — Telehealth: Payer: Self-pay

## 2016-06-07 DIAGNOSIS — R7309 Other abnormal glucose: Secondary | ICD-10-CM

## 2016-06-07 NOTE — Telephone Encounter (Signed)
Spoke with pt, pt states she understand her results, and follow up appointment to check [id number]. Future lab entered. Pt states she will come tomorrow fasting lab work. Pt had no further questions or concerns. LB

## 2016-06-08 ENCOUNTER — Other Ambulatory Visit: Payer: Commercial Managed Care - HMO

## 2016-06-09 ENCOUNTER — Other Ambulatory Visit (INDEPENDENT_AMBULATORY_CARE_PROVIDER_SITE_OTHER): Payer: Commercial Managed Care - HMO

## 2016-06-09 DIAGNOSIS — R7309 Other abnormal glucose: Secondary | ICD-10-CM

## 2016-06-09 LAB — HEMOGLOBIN A1C: HEMOGLOBIN A1C: 6.2 % (ref 4.6–6.5)

## 2016-06-10 ENCOUNTER — Other Ambulatory Visit: Payer: Self-pay | Admitting: Family Medicine

## 2016-06-10 NOTE — Telephone Encounter (Signed)
Zanaflex. Last ov 06/03/16. Last fill 03/18/16 #[id number]. Please advise. LB

## 2016-06-14 ENCOUNTER — Other Ambulatory Visit: Payer: Commercial Managed Care - HMO

## 2016-06-14 ENCOUNTER — Other Ambulatory Visit: Payer: Self-pay | Admitting: Family Medicine

## 2016-06-14 DIAGNOSIS — E785 Hyperlipidemia, unspecified: Secondary | ICD-10-CM

## 2016-06-14 DIAGNOSIS — I10 Essential (primary) hypertension: Secondary | ICD-10-CM

## 2016-06-15 ENCOUNTER — Other Ambulatory Visit: Payer: Self-pay | Admitting: Family Medicine

## 2016-06-21 ENCOUNTER — Other Ambulatory Visit: Payer: Self-pay | Admitting: Family Medicine

## 2016-06-23 ENCOUNTER — Encounter: Payer: Self-pay | Admitting: Interventional Radiology

## 2016-06-24 ENCOUNTER — Ambulatory Visit (INDEPENDENT_AMBULATORY_CARE_PROVIDER_SITE_OTHER): Payer: Medicare HMO | Admitting: Family Medicine

## 2016-06-24 ENCOUNTER — Encounter: Payer: Self-pay | Admitting: Family Medicine

## 2016-06-24 DIAGNOSIS — I10 Essential (primary) hypertension: Secondary | ICD-10-CM | POA: Diagnosis not present

## 2016-06-24 DIAGNOSIS — F419 Anxiety disorder, unspecified: Secondary | ICD-10-CM

## 2016-06-24 DIAGNOSIS — R011 Cardiac murmur, unspecified: Secondary | ICD-10-CM | POA: Insufficient documentation

## 2016-06-24 MED ORDER — VALSARTAN 320 MG PO TABS: 320.0000 mg | ORAL_TABLET | Freq: Every day | ORAL | 0 refills | Status: DC

## 2016-06-24 MED ORDER — LORAZEPAM 1 MG PO TABS
ORAL_TABLET | ORAL | 1 refills | Status: DC
Start: 2016-06-24 — End: 2016-08-26

## 2016-06-24 NOTE — Progress Notes (Signed)
Subjective:    [name], female    DOB: [birth year], 56 y.o.   MRN: [id number]  Chief Complaint  Patient presents with  . follow up blood pressure  . Medication Refill    [facility]am [facility] to [facility]    HPI Patient is in today for f/u bp  Past Medical History:  Diagnosis Date  . Acute right MCA stroke ([place]) 01/27/11   Acute R MCA stroked  with L arm and leg weakness  . Anxiety 1980s   On Klonipin since age 73. Had addiction problem with Xanax which  Was therefore d/c . Seen in the past at [facility].   . Arthritis 04/16/2011  . Blind right eye   . CAD (coronary artery disease) 2007   istory of MI with stent in 2007 by Dr. Chancy Milroy, [facility]. Stent placement by Dr Cleatis Polka   . Depression 03/07/2011  . Fracture of fifth toe, left, closed 01/28/2011   History of left fifth toe proximal phalanx fracture when patient had a stroke (01/2011) and fell. Seen by Dr Doran Durand.    . Fractured toe 01/2011   History of left fifth toe proximal phalanx fracture when patient had a stroke (01/2011) and fell. Seen by Dr Doran Durand.   . Hyperlipidemia 03/07/2011  . Hypertension 03/07/2011  . Incontinence 04/14/2011  . Myocardial infarct, old 2008   stents  . PFO (patent foramen ovale) August 2012   PFO seen on TEE during hospitalization in 01/2011.  Patient to f/u with Dr. Leonie Man, neurology, for enrollment in trial for medical treatment of PFO  . Pneumonia   . Prediabetes 03/07/2011  . Psoriasis 03/07/2011  . Substance abuse    h/o narcotic abuse pt denies as of 01/09/12  . Tobacco abuse 03/07/2011  . Tobacco abuse 03/07/2011   Quit 01/2011     Past Surgical History:  Procedure Laterality Date  . cardiac stent     2008  . EYE SURGERY    . IR GENERIC HISTORICAL  03/16/2016   IR RADIOLOGIST EVAL & MGMT 03/16/2016 Marybelle Killings, MD [facility] INTERV RAD    Family History  Problem Relation Age of Onset  . Heart disease Mother   . Diabetes Mother   . Heart disease Father   . Diabetes  Father   . Cancer Sister 35    ovarian  . Heart disease Brother   . Diabetes Brother   . Heart disease Maternal Grandmother   . Heart disease Maternal Grandfather   . Heart disease Paternal Grandmother   . Heart disease Paternal Grandfather   . Lung cancer Brother   . Heart disease Brother     Social History   Social History  . Marital status: Married    Spouse name: dexter  . Number of children: 1  . Years of education: 36   Occupational History  . Disabled    Social History Main Topics  . Smoking status: Current Every Day Smoker    Packs/day: 0.50    Years: 30.00    Types: Cigarettes    Last attempt to quit: 04/07/2014  . Smokeless tobacco: Never Used     Comment: Trying to quit again.  . Alcohol use No  . Drug use: No  . Sexual activity: Yes    Partners: Male   Other Topics Concern  . Not on file   Social History Narrative   Pt married and lives with husband.     Outpatient Medications Prior to Visit  Medication  Sig Dispense Refill  . allopurinol (ZYLOPRIM) 300 MG tablet Take 1 tablet (300 mg total) by mouth daily. 30 tablet 5  . amLODipine (NORVASC) 5 MG tablet Take 2 tablets (10 mg total) by mouth daily. 180 tablet 1  . atropine 1 % ophthalmic solution     . clopidogrel (PLAVIX) 75 MG tablet Take 1 tablet (75 mg total) by mouth daily. 90 tablet 3  . eszopiclone (LUNESTA) 2 MG TABS tablet Take 1 tablet (2 mg total) by mouth at bedtime as needed. for sleep 30 tablet 0  . fenofibrate 160 MG tablet TAKE 1 TABLET EVERY DAY 90 tablet 1  . LORazepam (ATIVAN) 1 MG tablet TAKE 1 TABLET AT BEDTIME AS NEEDED FOR ANXIETY  OR FOR SLEEP 90 tablet 1  . losartan (COZAAR) 50 MG tablet TAKE 1 TABLET EVERY DAY 90 tablet 1  . lovastatin (MEVACOR) 20 MG tablet TAKE 1 TABLET BY MOUTH EVERY NIGHT AT BEDTIME 90 tablet 2  . metoprolol (LOPRESSOR) 100 MG tablet TAKE 1 TABLET TWICE DAILY 180 tablet 1  . potassium chloride (MICRO-K) 10 MEQ CR capsule TAKE 2 CAPSULES BY MOUTH DAILY 60  capsule 2  . prednisoLONE acetate (PRED FORTE) 1 % ophthalmic suspension 1 drop 4 (four) times daily.    Marland Kitchen tiZANidine (ZANAFLEX) 4 MG tablet TAKE 2 TABLETS BY MOUTH TWICE DAILY 270 tablet 0  . valsartan (DIOVAN) 160 MG tablet Take 1 tablet (160 mg total) by mouth daily. 30 tablet 0  . allopurinol (ZYLOPRIM) 300 MG tablet TAKE 1 TABLET BY MOUTH EVERY DAY 90 tablet 0  . lovastatin (MEVACOR) 20 MG tablet Take 1 tablet (20 mg total) by mouth at bedtime. 90 tablet 3   No facility-administered medications prior to visit.     Allergies  Allergen Reactions  . Azor [Amlodipine-Olmesartan] Other (See Comments)    Drops blood pressure too low (takes plain amlodipine at home July 2015)  . Trintellix [Vortioxetine] Itching    Review of Systems  Constitutional: Negative for fever and malaise/fatigue.  HENT: Negative for congestion.   Eyes: Negative for blurred vision.  Respiratory: Negative for cough and shortness of breath.   Cardiovascular: Negative for chest pain, palpitations and leg swelling.  Gastrointestinal: Negative for vomiting.  Musculoskeletal: Negative for back pain.  Skin: Negative for rash.  Neurological: Negative for loss of consciousness and headaches.       Objective:    Physical Exam  Constitutional: She is oriented to person, place, and time. She appears well-developed and well-nourished. No distress.  HENT:  Head: Normocephalic and atraumatic.  Eyes: Conjunctivae are normal.  Neck: Normal range of motion. No thyromegaly present.  Cardiovascular: Normal rate and regular rhythm.   Murmur heard. 2/6 murmur  Pulmonary/Chest: Effort normal and breath sounds normal. She has no wheezes.  Abdominal: Soft. Bowel sounds are normal. There is no tenderness.  Musculoskeletal: Normal range of motion. She exhibits no edema or deformity.  Neurological: She is alert and oriented to person, place, and time.  Skin: Skin is warm and dry. She is not diaphoretic.  Psychiatric: She has  a normal mood and affect.    BP (!) 182/90   Pulse 69   Temp 98.1 F (36.7 C) (Oral)   Resp 16   Ht 5' 5"  (1.651 m)   Wt 184 lb 12.8 oz (83.8 kg)   SpO2 97%   BMI 30.75 kg/m  Wt Readings from Last 3 Encounters:  06/24/16 184 lb 12.8 oz (83.8 kg)  06/03/16 187  lb 12.8 oz (85.2 kg)  03/23/16 185 lb 9.6 oz (84.2 kg)     Lab Results  Component Value Date   WBC 12.8 (H) 06/03/2016   HGB 12.9 06/03/2016   HCT 39.4 06/03/2016   PLT 304.0 06/03/2016   GLUCOSE 172 (H) 06/03/2016   CHOL 138 06/03/2016   TRIG 155.0 (H) 06/03/2016   HDL 31.00 (L) 06/03/2016   LDLDIRECT 97.0 02/25/2016   LDLCALC 76 06/03/2016   ALT 8 06/03/2016   AST 10 06/03/2016   NA 142 06/03/2016   K 4.4 06/03/2016   CL 109 06/03/2016   CREATININE 0.81 06/03/2016   BUN 21 06/03/2016   CO2 27 06/03/2016   TSH 0.46 05/29/2015   INR 1.03 01/27/2011   HGBA1C 6.2 06/09/2016   MICROALBUR 1.6 03/23/2016    Lab Results  Component Value Date   TSH 0.46 05/29/2015   Lab Results  Component Value Date   WBC 12.8 (H) 06/03/2016   HGB 12.9 06/03/2016   HCT 39.4 06/03/2016   MCV 88.8 06/03/2016   PLT 304.0 06/03/2016   Lab Results  Component Value Date   NA 142 06/03/2016   K 4.4 06/03/2016   CO2 27 06/03/2016   GLUCOSE 172 (H) 06/03/2016   BUN 21 06/03/2016   CREATININE 0.81 06/03/2016   BILITOT 0.2 06/03/2016   ALKPHOS 74 06/03/2016   AST 10 06/03/2016   ALT 8 06/03/2016   PROT 6.8 06/03/2016   ALBUMIN 4.4 06/03/2016   CALCIUM 9.3 06/03/2016   ANIONGAP 9 03/26/2015   GFR 78.07 06/03/2016   Lab Results  Component Value Date   CHOL 138 06/03/2016   Lab Results  Component Value Date   HDL 31.00 (L) 06/03/2016   Lab Results  Component Value Date   LDLCALC 76 06/03/2016   Lab Results  Component Value Date   TRIG 155.0 (H) 06/03/2016   Lab Results  Component Value Date   CHOLHDL 4 06/03/2016   Lab Results  Component Value Date   HGBA1C 6.2 06/09/2016       Assessment & Plan:     Problem List Items Addressed This Visit      Other   Anxiety      I am having Ms. Mura maintain her prednisoLONE acetate, atropine, eszopiclone, LORazepam, clopidogrel, amLODipine, allopurinol, potassium chloride, valsartan, tiZANidine, fenofibrate, losartan, metoprolol, and lovastatin.  No orders of the defined types were placed in this encounter.  CMA served as Education administrator during this visit. History, Physical and Plan performed by medical provider. Documentation and orders reviewed and attested to.   Magdalene Molly, [place]

## 2016-06-24 NOTE — Assessment & Plan Note (Signed)
Inc diovan to 363m daily con't metoprolol and norvasc Fu 2-3 weeks

## 2016-06-24 NOTE — Patient Instructions (Signed)
Hypertension Hypertension, commonly called high blood pressure, is when the force of blood pumping through your arteries is too strong. Your arteries are the blood vessels that carry blood from your heart throughout your body. A blood pressure reading consists of a higher number over a lower number, such as 110/72. The higher number (systolic) is the pressure inside your arteries when your heart pumps. The lower number (diastolic) is the pressure inside your arteries when your heart relaxes. Ideally you want your blood pressure below 120/80. Hypertension forces your heart to work harder to pump blood. Your arteries may become narrow or stiff. Having untreated or uncontrolled hypertension can cause heart attack, stroke, kidney disease, and other problems. What increases the risk? Some risk factors for high blood pressure are controllable. Others are not. Risk factors you cannot control include:  Race. You may be at higher risk if you are African American.  Age. Risk increases with age.  Gender. Men are at higher risk than women before age 45 years. After age 65, women are at higher risk than men. Risk factors you can control include:  Not getting enough exercise or physical activity.  Being overweight.  Getting too much fat, sugar, calories, or salt in your diet.  Drinking too much alcohol. What are the signs or symptoms? Hypertension does not usually cause signs or symptoms. Extremely high blood pressure (hypertensive crisis) may cause headache, anxiety, shortness of breath, and nosebleed. How is this diagnosed? To check if you have hypertension, your health care provider will measure your blood pressure while you are seated, with your arm held at the level of your heart. It should be measured at least twice using the same arm. Certain conditions can cause a difference in blood pressure between your right and left arms. A blood pressure reading that is higher than normal on one occasion does  not mean that you need treatment. If it is not clear whether you have high blood pressure, you may be asked to return on a different day to have your blood pressure checked again. Or, you may be asked to monitor your blood pressure at home for 1 or more weeks. How is this treated? Treating high blood pressure includes making lifestyle changes and possibly taking medicine. Living a healthy lifestyle can help lower high blood pressure. You may need to change some of your habits. Lifestyle changes may include:  Following the DASH diet. This diet is high in fruits, vegetables, and whole grains. It is low in salt, red meat, and added sugars.  Keep your sodium intake below 2,300 mg per day.  Getting at least 30-45 minutes of aerobic exercise at least 4 times per week.  Losing weight if necessary.  Not smoking.  Limiting alcoholic beverages.  Learning ways to reduce stress. Your health care provider may prescribe medicine if lifestyle changes are not enough to get your blood pressure under control, and if one of the following is true:  You are 18-59 years of age and your systolic blood pressure is above 140.  You are 60 years of age or older, and your systolic blood pressure is above 150.  Your diastolic blood pressure is above 90.  You have diabetes, and your systolic blood pressure is over 140 or your diastolic blood pressure is over 90.  You have kidney disease and your blood pressure is above 140/90.  You have heart disease and your blood pressure is above 140/90. Your personal target blood pressure may vary depending on your medical   conditions, your age, and other factors. Follow these instructions at home:  Have your blood pressure rechecked as directed by your health care provider.  Take medicines only as directed by your health care provider. Follow the directions carefully. Blood pressure medicines must be taken as prescribed. The medicine does not work as well when you skip  doses. Skipping doses also puts you at risk for problems.  Do not smoke.  Monitor your blood pressure at home as directed by your health care provider. Contact a health care provider if:  You think you are having a reaction to medicines taken.  You have recurrent headaches or feel dizzy.  You have swelling in your ankles.  You have trouble with your vision. Get help right away if:  You develop a severe headache or confusion.  You have unusual weakness, numbness, or feel faint.  You have severe chest or abdominal pain.  You vomit repeatedly.  You have trouble breathing. This information is not intended to replace advice given to you by your health care provider. Make sure you discuss any questions you have with your health care provider. Document Released: 06/06/2005 Document Revised: 11/12/2015 Document Reviewed: 03/29/2013 Elsevier Interactive Patient Education  2017 [employer]  

## 2016-06-24 NOTE — Progress Notes (Signed)
[name], female    DOB: April 28, 1961  Age: 56 y.o. MRN: [id number]    Subjective:  Subjective  HPI [name] presents for f/u bp.  No complaints.    Review of Systems  Constitutional: Negative for appetite change, diaphoresis, fatigue and unexpected weight change.  Eyes: Negative for pain, redness and visual disturbance.  Respiratory: Negative for cough, chest tightness, shortness of breath and wheezing.   Cardiovascular: Negative for chest pain, palpitations and leg swelling.  Endocrine: Negative for cold intolerance, heat intolerance, polydipsia, polyphagia and polyuria.  Genitourinary: Negative for difficulty urinating, dysuria and frequency.  Neurological: Negative for dizziness, light-headedness, numbness and headaches.    History Past Medical History:  Diagnosis Date  . Acute right MCA stroke ([place]) 01/27/11   Acute R MCA stroked  with L arm and leg weakness  . Anxiety 1980s   On Klonipin since age 46. Had addiction problem with Xanax which  Was therefore d/c . Seen in the past at [facility].   . Arthritis 04/16/2011  . Blind right eye   . CAD (coronary artery disease) 2007   istory of MI with stent in 2007 by Dr. Chancy Milroy, [facility]. Stent placement by Dr Cleatis Polka   . Depression 03/07/2011  . Fracture of fifth toe, left, closed 01/28/2011   History of left fifth toe proximal phalanx fracture when patient had a stroke (01/2011) and fell. Seen by Dr Doran Durand.    . Fractured toe 01/2011   History of left fifth toe proximal phalanx fracture when patient had a stroke (01/2011) and fell. Seen by Dr Doran Durand.   . Hyperlipidemia 03/07/2011  . Hypertension 03/07/2011  . Incontinence 04/14/2011  . Myocardial infarct, old 2008   stents  . PFO (patent foramen ovale) August 2012   PFO seen on TEE during hospitalization in 01/2011.  Patient to f/u with Dr. Leonie Man, neurology, for enrollment in trial for medical treatment of PFO  . Pneumonia   . Prediabetes 03/07/2011  .  Psoriasis 03/07/2011  . Substance abuse    h/o narcotic abuse pt denies as of 01/09/12  . Tobacco abuse 03/07/2011  . Tobacco abuse 03/07/2011   Quit 01/2011     She has a past surgical history that includes cardiac stent; Eye surgery; and ir generic historical (03/16/2016).   Her family history includes Cancer (age of onset: 62) in her sister; Diabetes in her brother, father, and mother; Heart disease in her brother, brother, father, maternal grandfather, maternal grandmother, mother, paternal grandfather, and paternal grandmother; Lung cancer in her brother.She reports that she has been smoking Cigarettes.  She has a 15.00 pack-year smoking history. She has never used smokeless tobacco. She reports that she does not drink alcohol or use drugs.  Current Outpatient Prescriptions on File Prior to Visit  Medication Sig Dispense Refill  . allopurinol (ZYLOPRIM) 300 MG tablet Take 1 tablet (300 mg total) by mouth daily. 30 tablet 5  . amLODipine (NORVASC) 5 MG tablet Take 2 tablets (10 mg total) by mouth daily. 180 tablet 1  . atropine 1 % ophthalmic solution     . clopidogrel (PLAVIX) 75 MG tablet Take 1 tablet (75 mg total) by mouth daily. 90 tablet 3  . eszopiclone (LUNESTA) 2 MG TABS tablet Take 1 tablet (2 mg total) by mouth at bedtime as needed. for sleep 30 tablet 0  . fenofibrate 160 MG tablet TAKE 1 TABLET EVERY DAY 90 tablet 1  . losartan (COZAAR) 50 MG tablet TAKE 1 TABLET  EVERY DAY 90 tablet 1  . lovastatin (MEVACOR) 20 MG tablet TAKE 1 TABLET BY MOUTH EVERY NIGHT AT BEDTIME 90 tablet 2  . metoprolol (LOPRESSOR) 100 MG tablet TAKE 1 TABLET TWICE DAILY 180 tablet 1  . potassium chloride (MICRO-K) 10 MEQ CR capsule TAKE 2 CAPSULES BY MOUTH DAILY 60 capsule 2  . prednisoLONE acetate (PRED FORTE) 1 % ophthalmic suspension 1 drop 4 (four) times daily.    Marland Kitchen tiZANidine (ZANAFLEX) 4 MG tablet TAKE 2 TABLETS BY MOUTH TWICE DAILY 270 tablet 0   No current facility-administered medications on file  prior to visit.      Objective:  Objective  Physical Exam  Constitutional: She is oriented to person, place, and time. She appears well-developed and well-nourished.  HENT:  Head: Normocephalic and atraumatic.  Eyes: Conjunctivae and EOM are normal.  Neck: Normal range of motion. Neck supple. No JVD present. Carotid bruit is not present. No thyromegaly present.  Cardiovascular: Normal rate, regular rhythm and normal heart sounds.   No murmur heard. Pulmonary/Chest: Effort normal and breath sounds normal. No respiratory distress. She has no wheezes. She has no rales. She exhibits no tenderness.  Musculoskeletal: She exhibits no edema.  Neurological: She is alert and oriented to person, place, and time.  Psychiatric: She has a normal mood and affect. Her behavior is normal. Judgment and thought content normal.  Nursing note and vitals reviewed.  BP (!) 182/90   Pulse 69   Temp 98.1 F (36.7 C) (Oral)   Resp 16   Ht 5' 5"  (1.651 m)   Wt 184 lb 12.8 oz (83.8 kg)   SpO2 97%   BMI 30.75 kg/m  Wt Readings from Last 3 Encounters:  06/24/16 184 lb 12.8 oz (83.8 kg)  06/03/16 187 lb 12.8 oz (85.2 kg)  03/23/16 185 lb 9.6 oz (84.2 kg)     Lab Results  Component Value Date   WBC 12.8 (H) 06/03/2016   HGB 12.9 06/03/2016   HCT 39.4 06/03/2016   PLT 304.0 06/03/2016   GLUCOSE 172 (H) 06/03/2016   CHOL 138 06/03/2016   TRIG 155.0 (H) 06/03/2016   HDL 31.00 (L) 06/03/2016   LDLDIRECT 97.0 02/25/2016   LDLCALC 76 06/03/2016   ALT 8 06/03/2016   AST 10 06/03/2016   NA 142 06/03/2016   K 4.4 06/03/2016   CL 109 06/03/2016   CREATININE 0.81 06/03/2016   BUN 21 06/03/2016   CO2 27 06/03/2016   TSH 0.46 05/29/2015   INR 1.03 01/27/2011   HGBA1C 6.2 06/09/2016   MICROALBUR 1.6 03/23/2016    Ir Radiologist Eval & Mgmt  Result Date: 06/23/2016 Please refer to "Notes" to see consult details.    Assessment & Plan:  Plan  I have discontinued Ms. Toruno's valsartan. I am also  having her start on valsartan. Additionally, I am having her maintain her prednisoLONE acetate, atropine, eszopiclone, clopidogrel, amLODipine, allopurinol, potassium chloride, tiZANidine, fenofibrate, losartan, metoprolol, lovastatin, and LORazepam.  Meds ordered this encounter  Medications  . LORazepam (ATIVAN) 1 MG tablet    Sig: TAKE 1 TABLET AT BEDTIME AS NEEDED FOR ANXIETY  OR FOR SLEEP    Dispense:  90 tablet    Refill:  1  . valsartan (DIOVAN) 320 MG tablet    Sig: Take 1 tablet (320 mg total) by mouth daily.    Dispense:  30 tablet    Refill:  0    Problem List Items Addressed This Visit  Unprioritized   Anxiety   Relevant Medications   LORazepam (ATIVAN) 1 MG tablet   Essential hypertension (Chronic)    Inc diovan to 327m daily con't metoprolol and norvasc Fu 2-3 weeks      Relevant Medications   valsartan (DIOVAN) 320 MG tablet   Other Relevant Orders   ECHOCARDIOGRAM COMPLETE      Follow-up: Return in about 3 weeks (around 07/15/2016) for follow up.  YAnn Held DO

## 2016-06-24 NOTE — Progress Notes (Signed)
Pre visit review using our clinic review tool, if applicable. No additional management support is needed unless otherwise documented below in the visit note. 

## 2016-06-29 ENCOUNTER — Emergency Department (HOSPITAL_BASED_OUTPATIENT_CLINIC_OR_DEPARTMENT_OTHER)
Admission: EM | Admit: 2016-06-29 | Discharge: 2016-06-29 | Disposition: A | Discharge status: Home / Self Care | Payer: Medicare HMO | Attending: Emergency Medicine | Admitting: Emergency Medicine

## 2016-06-29 ENCOUNTER — Other Ambulatory Visit: Payer: Self-pay

## 2016-06-29 ENCOUNTER — Encounter (HOSPITAL_BASED_OUTPATIENT_CLINIC_OR_DEPARTMENT_OTHER): Payer: Self-pay | Admitting: *Deleted

## 2016-06-29 ENCOUNTER — Emergency Department (HOSPITAL_BASED_OUTPATIENT_CLINIC_OR_DEPARTMENT_OTHER): Payer: Medicare HMO

## 2016-06-29 ENCOUNTER — Ambulatory Visit (HOSPITAL_BASED_OUTPATIENT_CLINIC_OR_DEPARTMENT_OTHER)
Admission: RE | Admit: 2016-06-29 | Discharge: 2016-06-29 | Disposition: A | Discharge status: Home / Self Care | Payer: Medicare HMO | Source: Ambulatory Visit | Attending: Family Medicine | Admitting: Family Medicine

## 2016-06-29 DIAGNOSIS — F17210 Nicotine dependence, cigarettes, uncomplicated: Secondary | ICD-10-CM | POA: Diagnosis not present

## 2016-06-29 DIAGNOSIS — I10 Essential (primary) hypertension: Secondary | ICD-10-CM | POA: Diagnosis not present

## 2016-06-29 DIAGNOSIS — Z79899 Other long term (current) drug therapy: Secondary | ICD-10-CM | POA: Diagnosis not present

## 2016-06-29 DIAGNOSIS — R0602 Shortness of breath: Secondary | ICD-10-CM | POA: Diagnosis not present

## 2016-06-29 DIAGNOSIS — I2510 Atherosclerotic heart disease of native coronary artery without angina pectoris: Secondary | ICD-10-CM | POA: Diagnosis not present

## 2016-06-29 DIAGNOSIS — R531 Weakness: Secondary | ICD-10-CM | POA: Diagnosis not present

## 2016-06-29 DIAGNOSIS — I5189 Other ill-defined heart diseases: Secondary | ICD-10-CM | POA: Insufficient documentation

## 2016-06-29 DIAGNOSIS — I252 Old myocardial infarction: Secondary | ICD-10-CM | POA: Diagnosis not present

## 2016-06-29 LAB — COMPREHENSIVE METABOLIC PANEL
ALT: 12 U/L — AB (ref 14–54)
ANION GAP: 8 (ref 5–15)
AST: 17 U/L (ref 15–41)
Albumin: 4.2 g/dL (ref 3.5–5.0)
Alkaline Phosphatase: 68 U/L (ref 38–126)
BUN: 15 mg/dL (ref 6–20)
CHLORIDE: 109 mmol/L (ref 101–111)
CO2: 23 mmol/L (ref 22–32)
CREATININE: 0.69 mg/dL (ref 0.44–1.00)
Calcium: 9.5 mg/dL (ref 8.9–10.3)
Glucose, Bld: 191 mg/dL — ABNORMAL HIGH (ref 65–99)
Potassium: 3.2 mmol/L — ABNORMAL LOW (ref 3.5–5.1)
SODIUM: 140 mmol/L (ref 135–145)
Total Bilirubin: 0.4 mg/dL (ref 0.3–1.2)
Total Protein: 7.2 g/dL (ref 6.5–8.1)

## 2016-06-29 LAB — MAGNESIUM: MAGNESIUM: 1.7 mg/dL (ref 1.7–2.4)

## 2016-06-29 LAB — CBC
HCT: 41 % (ref 36.0–46.0)
Hemoglobin: 13.4 g/dL (ref 12.0–15.0)
MCH: 29.3 pg (ref 26.0–34.0)
MCHC: 32.7 g/dL (ref 30.0–36.0)
MCV: 89.7 fL (ref 78.0–100.0)
PLATELETS: 277 10*3/uL (ref 150–400)
RBC: 4.57 MIL/uL (ref 3.87–5.11)
RDW: 13.6 % (ref 11.5–15.5)
WBC: 12.5 10*3/uL — ABNORMAL HIGH (ref 4.0–10.5)

## 2016-06-29 LAB — TROPONIN I: Troponin I: <0.03 ng/mL (ref <0.03)

## 2016-06-29 LAB — URINALYSIS, ROUTINE W REFLEX MICROSCOPIC
BILIRUBIN URINE: NEGATIVE
Glucose, UA: NEGATIVE mg/dL
HGB URINE DIPSTICK: NEGATIVE
KETONES UR: NEGATIVE mg/dL
Nitrite: NEGATIVE
PH: 5.5 (ref 5.0–8.0)
Protein, ur: NEGATIVE mg/dL
SPECIFIC GRAVITY, URINE: 1.017 (ref 1.005–1.030)

## 2016-06-29 LAB — URINALYSIS, MICROSCOPIC (REFLEX): RBC / HPF: NONE SEEN RBC/hpf (ref 0–5)

## 2016-06-29 LAB — PROTIME-INR
INR: 1.06
PROTHROMBIN TIME: 13.8 s (ref 11.4–15.2)

## 2016-06-29 MED ORDER — HYDRALAZINE HCL 20 MG/ML IJ SOLN
10.0000 mg | Freq: Once | INTRAMUSCULAR | Status: AC
  Administered 2016-06-29: 10 mg via INTRAVENOUS
  Filled 2016-06-29: qty 1

## 2016-06-29 NOTE — ED Provider Notes (Signed)
[place] DEPT [facility] Provider Note   CSN: [id number] Arrival date & time: 06/29/16  1400     History   Chief Complaint Chief Complaint  Patient presents with  . Hypertension    HPI Sandra [name] is a 56 y.o. female.  Pt presents to the ED today with elevated bp.  The pt saw her PCP on 1/5.  Her bp was elevated, so her diovan was increased to 320 mg.  She said she has been taking her meds as directed.  At the same visit, her doctor ordered an ECHO for a murmur that was heard.  She had her ECHO done today.  She was sent here from there because her bp was elevated.  Pt denies any CP or sob, but does complain of weakness.      Past Medical History:  Diagnosis Date  . Acute right MCA stroke ([place]) 01/27/11   Acute R MCA stroked  with L arm and leg weakness  . Anxiety 1980s   On Klonipin since age 28. Had addiction problem with Xanax which  Was therefore d/c . Seen in the past at [facility].   . Arthritis 04/16/2011  . Blind right eye   . CAD (coronary artery disease) 2007   istory of MI with stent in 2007 by Dr. Chancy Milroy, [facility]. Stent placement by Dr Cleatis Polka   . Depression 03/07/2011  . Fracture of fifth toe, left, closed 01/28/2011   History of left fifth toe proximal phalanx fracture when patient had a stroke (01/2011) and fell. Seen by Dr Doran Durand.    . Fractured toe 01/2011   History of left fifth toe proximal phalanx fracture when patient had a stroke (01/2011) and fell. Seen by Dr Doran Durand.   . Hyperlipidemia 03/07/2011  . Hypertension 03/07/2011  . Incontinence 04/14/2011  . Myocardial infarct, old 2008   stents  . PFO (patent foramen ovale) August 2012   PFO seen on TEE during hospitalization in 01/2011.  Patient to f/u with Dr. Leonie Man, neurology, for enrollment in trial for medical treatment of PFO  . Pneumonia   . Prediabetes 03/07/2011  . Psoriasis 03/07/2011  . Substance abuse    h/o narcotic abuse pt denies as of 01/09/12  . Tobacco abuse 03/07/2011  .  Tobacco abuse 03/07/2011   Quit 01/2011     Patient Active Problem List   Diagnosis Date Noted  . Undiagnosed cardiac murmurs 06/24/2016  . Morbid obesity ([place]) 06/05/2015  . Spastic hemiplegia affecting nondominant side ([place]) 04/02/2015  . Hypokalemia 12/23/2013  . Cold intolerance 11/25/2013  . Recurrent pneumonia 11/06/2013  . MRSA bacteremia 11/06/2013  . Bilateral endophthalmitis 11/06/2013  . Metabolic syndrome 40/34/7425  . Routine adult health maintenance 08/13/2013  . Dyspareunia, female 02/06/2013  . Lumbar and sacral osteoarthritis 12/06/2012  . Diarrhea 11/15/2012  . Insomnia 07/23/2012  . HIV exposure 06/27/2012  . Gout 01/31/2012  . Benzodiazepine dependence, continuous ([place]) 01/26/2012    Class: Chronic  . Hot flashes 01/09/2012  . Tobacco abuse 01/09/2012  . Arthritis 04/16/2011  . Anxiety 04/14/2011  . Incontinence 04/14/2011  . PFO (patent foramen ovale) 03/07/2011  . CAD (coronary artery disease) 03/07/2011  . Essential hypertension 03/07/2011  . Hyperlipidemia 03/07/2011  . Depression 03/07/2011  . Psoriasis 03/07/2011  . H/O: CVA (cerebrovascular accident) 01/27/2011    Past Surgical History:  Procedure Laterality Date  . cardiac stent     2008  . EYE SURGERY    . IR GENERIC HISTORICAL  03/16/2016  IR RADIOLOGIST EVAL & MGMT 03/16/2016 Marybelle Killings, MD [facility] INTERV RAD    OB History    No data available       Home Medications    Prior to Admission medications   Medication Sig Start Date End Date Taking? Authorizing Provider  allopurinol (ZYLOPRIM) 300 MG tablet Take 1 tablet (300 mg total) by mouth daily. 02/28/16   Alferd Apa Lowne Chase, DO  amLODipine (NORVASC) 5 MG tablet Take 2 tablets (10 mg total) by mouth daily. 02/28/16   Rosalita Chessman Chase, DO  atropine 1 % ophthalmic solution  03/20/15   Historical Provider, MD  clopidogrel (PLAVIX) 75 MG tablet Take 1 tablet (75 mg total) by mouth daily. 02/28/16   Alferd Apa Lowne Chase, DO    eszopiclone (LUNESTA) 2 MG TABS tablet Take 1 tablet (2 mg total) by mouth at bedtime as needed. for sleep 08/05/15   Debbrah Alar, NP  fenofibrate 160 MG tablet TAKE 1 TABLET EVERY DAY 06/15/16   Alferd Apa Lowne Chase, DO  LORazepam (ATIVAN) 1 MG tablet TAKE 1 TABLET AT BEDTIME AS NEEDED FOR ANXIETY  OR FOR SLEEP 06/24/16   Alferd Apa Lowne Chase, DO  losartan (COZAAR) 50 MG tablet TAKE 1 TABLET EVERY DAY 06/15/16   Alferd Apa Lowne Chase, DO  lovastatin (MEVACOR) 20 MG tablet TAKE 1 TABLET BY MOUTH EVERY NIGHT AT BEDTIME 06/22/16   Alferd Apa Lowne Chase, DO  metoprolol (LOPRESSOR) 100 MG tablet TAKE 1 TABLET TWICE DAILY 06/15/16   Rosalita Chessman Chase, DO  potassium chloride (MICRO-K) 10 MEQ CR capsule TAKE 2 CAPSULES BY MOUTH DAILY 05/20/16   Rosalita Chessman Chase, DO  prednisoLONE acetate (PRED FORTE) 1 % ophthalmic suspension 1 drop 4 (four) times daily.    Historical Provider, MD  tiZANidine (ZANAFLEX) 4 MG tablet TAKE 2 TABLETS BY MOUTH TWICE DAILY 06/10/16   Debbrah Alar, NP  valsartan (DIOVAN) 320 MG tablet Take 1 tablet (320 mg total) by mouth daily. 06/24/16   Ann Held, DO    Family History Family History  Problem Relation Age of Onset  . Heart disease Mother   . Diabetes Mother   . Heart disease Father   . Diabetes Father   . Cancer Sister 71    ovarian  . Heart disease Brother   . Diabetes Brother   . Heart disease Maternal Grandmother   . Heart disease Maternal Grandfather   . Heart disease Paternal Grandmother   . Heart disease Paternal Grandfather   . Lung cancer Brother   . Heart disease Brother     Social History Social History  Substance Use Topics  . Smoking status: Current Every Day Smoker    Packs/day: 2.00    Years: 30.00    Types: Cigarettes    Last attempt to quit: 04/07/2014  . Smokeless tobacco: Never Used     Comment: Trying to quit again.  . Alcohol use No     Allergies   Azor [amlodipine-olmesartan] and Trintellix  [vortioxetine]   Review of Systems Review of Systems  Neurological: Positive for weakness.  All other systems reviewed and are negative.    Physical Exam Updated Vital Signs BP 186/86   Pulse 61   Temp 98 F (36.7 C)   Resp 16   Ht 5' 5"  (1.651 m)   Wt 187 lb (84.8 kg)   SpO2 97%   BMI 31.12 kg/m   Physical Exam  Constitutional: She is oriented to  person, place, and time. She appears well-developed and well-nourished.  HENT:  Head: Normocephalic and atraumatic.  Right Ear: External ear normal.  Left Ear: External ear normal.  Nose: Nose normal.  Mouth/Throat: Oropharynx is clear and moist.  Eyes:  Right eye blindness with ptosis (endophlamitis in 2015 from MRSA sepsis)  Neck: Normal range of motion. Neck supple.  Cardiovascular: Normal rate, regular rhythm, normal heart sounds and intact distal pulses.   Pulmonary/Chest: Effort normal and breath sounds normal.  Abdominal: Soft. Bowel sounds are normal.  Musculoskeletal: Normal range of motion.  Neurological: She is alert and oriented to person, place, and time.  Skin: Skin is warm and dry. Capillary refill takes less than 2 seconds.  Psychiatric: She has a normal mood and affect. Her behavior is normal. Judgment and thought content normal.  Nursing note and vitals reviewed.    ED Treatments / Results  Labs (all labs ordered are listed, but only abnormal results are displayed) Labs Reviewed  CBC - Abnormal; Notable for the following:       Result Value   WBC 12.5 (*)    All other components within normal limits  COMPREHENSIVE METABOLIC PANEL - Abnormal; Notable for the following:    Potassium 3.2 (*)    Glucose, Bld 191 (*)    ALT 12 (*)    All other components within normal limits  MAGNESIUM  PROTIME-INR  TROPONIN I  URINALYSIS, ROUTINE W REFLEX MICROSCOPIC    EKG  EKG Interpretation  Date/Time:  Wednesday June 29 2016 14:41:33 EST Ventricular Rate:  66 PR Interval:    QRS Duration: 89 QT  Interval:  430 QTC Calculation: 451 R Axis:   31 Text Interpretation:  Sinus rhythm Probable left atrial enlargement Borderline T abnormalities, anterior leads Confirmed by Gilford Raid MD, Kacie Huxtable ([id number]) on 06/29/2016 3:25:59 PM       Radiology Dg Chest 2 View  Result Date: 06/29/2016 CLINICAL DATA:  Shortness of breath. EXAM: CHEST  2 VIEW COMPARISON:  One 04/04/2015. FINDINGS: Heart size normal. Mediastinum and hilar structures are normal. Low lung volumes. Left base pleuroparenchymal thickening noted consistent with scarring. No pleural effusion or pneumothorax. Heart size normal. IMPRESSION: Low lung volumes. Left base pleuroparenchymal thickening again noted consistent with scarring . Electronically Signed   By: Marcello Moores  Register   On: 06/29/2016 14:31    Procedures Procedures (including critical care time)  Medications Ordered in ED Medications  hydrALAZINE (APRESOLINE) injection 10 mg (10 mg Intravenous Given 06/29/16 1432)     Initial Impression / Assessment and Plan / ED Course  I have reviewed the triage vital signs and the nursing notes.  Pertinent labs & imaging results that were available during my care of the patient were reviewed by me and considered in my medical decision making (see chart for details).  Clinical Course     BP is still high, but it is better than it was in ECHO after 10 mg of hydralazine.  The pt's husband made an appt for pt with her doctor for tomorrow.  Because of that, I will not change any of her meds as she will see her doctor in the morning.  Pt knows to return if worse.  Final Clinical Impressions(s) / ED Diagnoses   Final diagnoses:  Essential hypertension    New Prescriptions New Prescriptions   No medications on file     Isla Pence, MD 06/29/16 1531

## 2016-06-29 NOTE — ED Triage Notes (Signed)
Pt sent here from ECHO for eval, increased BP

## 2016-06-29 NOTE — Progress Notes (Signed)
  Echocardiogram 2D Echocardiogram has been performed.  Darletta Noblett L Androw 06/29/2016, 1:57 PM

## 2016-06-30 ENCOUNTER — Encounter: Payer: Self-pay | Admitting: Family Medicine

## 2016-06-30 ENCOUNTER — Encounter (HOSPITAL_BASED_OUTPATIENT_CLINIC_OR_DEPARTMENT_OTHER): Payer: Self-pay

## 2016-06-30 ENCOUNTER — Ambulatory Visit (INDEPENDENT_AMBULATORY_CARE_PROVIDER_SITE_OTHER): Payer: Medicare HMO | Admitting: Family Medicine

## 2016-06-30 ENCOUNTER — Emergency Department (HOSPITAL_BASED_OUTPATIENT_CLINIC_OR_DEPARTMENT_OTHER)
Admission: EM | Admit: 2016-06-30 | Discharge: 2016-06-30 | Disposition: A | Discharge status: Home / Self Care | Payer: Medicare HMO | Attending: Emergency Medicine | Admitting: Emergency Medicine

## 2016-06-30 VITALS — BP 170/80 | HR 66 | Temp 98.5°F | Ht 65.0 in | Wt 181.4 lb

## 2016-06-30 DIAGNOSIS — I2510 Atherosclerotic heart disease of native coronary artery without angina pectoris: Secondary | ICD-10-CM | POA: Diagnosis not present

## 2016-06-30 DIAGNOSIS — F17210 Nicotine dependence, cigarettes, uncomplicated: Secondary | ICD-10-CM | POA: Diagnosis not present

## 2016-06-30 DIAGNOSIS — E876 Hypokalemia: Secondary | ICD-10-CM

## 2016-06-30 DIAGNOSIS — Z79899 Other long term (current) drug therapy: Secondary | ICD-10-CM | POA: Diagnosis not present

## 2016-06-30 DIAGNOSIS — I10 Essential (primary) hypertension: Secondary | ICD-10-CM | POA: Diagnosis not present

## 2016-06-30 MED ORDER — SPIRONOLACTONE 25 MG PO TABS
25.0000 mg | ORAL_TABLET | Freq: Every day | ORAL | 1 refills | Status: DC
Start: 2016-06-30 — End: 2016-08-23

## 2016-06-30 NOTE — ED Triage Notes (Signed)
Pt c/o elevated blood pressure this morning and having the "sweats"; states has a PCP appt at 10:15a but afraid to wait d/t hx of stroke and MI

## 2016-06-30 NOTE — ED Provider Notes (Signed)
[place] DEPT Provider Note   CSN: [id number] Arrival date & time: 06/30/16 3016     History    Chief Complaint  Patient presents with  . Hypertension     HPI Sandra [name] is a 56 y.o. female.  56yo F w/ PMH including HTN, CVA, CAD who p/w HTN. Pt reports that her BP medication was increased 1.5 weeks ago for persistent HTN. She presented here yesterday because her BP was high. She was discharged home and has taken her medicine this morning but her BP has still been high. She had some sweating and got worried because of her medical history which is why she came back in. She has an appt w/ PCP today at 10:15am. She denies CP, SOB, N/V/D. No pain. She endorses mild cold sx for which she has been taking mucinex DM. Past Medical History:  Diagnosis Date  . Acute right MCA stroke ([place]) 01/27/11   Acute R MCA stroked  with L arm and leg weakness  . Anxiety 1980s   On Klonipin since age 5. Had addiction problem with Xanax which  Was therefore d/c . Seen in the past at [facility].   . Arthritis 04/16/2011  . Blind right eye   . CAD (coronary artery disease) 2007   istory of MI with stent in 2007 by Dr. Chancy Milroy, [facility]. Stent placement by Dr Cleatis Polka   . Depression 03/07/2011  . Fracture of fifth toe, left, closed 01/28/2011   History of left fifth toe proximal phalanx fracture when patient had a stroke (01/2011) and fell. Seen by Dr Doran Durand.    . Fractured toe 01/2011   History of left fifth toe proximal phalanx fracture when patient had a stroke (01/2011) and fell. Seen by Dr Doran Durand.   . Hyperlipidemia 03/07/2011  . Hypertension 03/07/2011  . Incontinence 04/14/2011  . Myocardial infarct, old 2008   stents  . PFO (patent foramen ovale) August 2012   PFO seen on TEE during hospitalization in 01/2011.  Patient to f/u with Dr. Leonie Man, neurology, for enrollment in trial for medical treatment of PFO  . Pneumonia   . Prediabetes 03/07/2011  . Psoriasis 03/07/2011  . Substance  abuse    h/o narcotic abuse pt denies as of 01/09/12  . Tobacco abuse 03/07/2011  . Tobacco abuse 03/07/2011   Quit 01/2011      Patient Active Problem List   Diagnosis Date Noted  . Undiagnosed cardiac murmurs 06/24/2016  . Morbid obesity ([place]) 06/05/2015  . Spastic hemiplegia affecting nondominant side ([place]) 04/02/2015  . Hypokalemia 12/23/2013  . Cold intolerance 11/25/2013  . Recurrent pneumonia 11/06/2013  . MRSA bacteremia 11/06/2013  . Bilateral endophthalmitis 11/06/2013  . Metabolic syndrome 06/28/3233  . Routine adult health maintenance 08/13/2013  . Dyspareunia, female 02/06/2013  . Lumbar and sacral osteoarthritis 12/06/2012  . Diarrhea 11/15/2012  . Insomnia 07/23/2012  . HIV exposure 06/27/2012  . Gout 01/31/2012  . Benzodiazepine dependence, continuous ([place]) 01/26/2012    Class: Chronic  . Hot flashes 01/09/2012  . Tobacco abuse 01/09/2012  . Arthritis 04/16/2011  . Anxiety 04/14/2011  . Incontinence 04/14/2011  . PFO (patent foramen ovale) 03/07/2011  . CAD (coronary artery disease) 03/07/2011  . Essential hypertension 03/07/2011  . Hyperlipidemia 03/07/2011  . Depression 03/07/2011  . Psoriasis 03/07/2011  . H/O: CVA (cerebrovascular accident) 01/27/2011    Past Surgical History:  Procedure Laterality Date  . cardiac stent     2008  . EYE SURGERY    .  IR GENERIC HISTORICAL  03/16/2016   IR RADIOLOGIST EVAL & MGMT 03/16/2016 Marybelle Killings, MD [facility] INTERV RAD    OB History    No data available        Home Medications    Prior to Admission medications   Medication Sig Start Date End Date Taking? Authorizing Provider  allopurinol (ZYLOPRIM) 300 MG tablet Take 1 tablet (300 mg total) by mouth daily. 02/28/16   Alferd Apa Lowne Chase, DO  amLODipine (NORVASC) 5 MG tablet Take 2 tablets (10 mg total) by mouth daily. 02/28/16   Rosalita Chessman Chase, DO  atropine 1 % ophthalmic solution  03/20/15   Historical Provider, MD  clopidogrel (PLAVIX) 75 MG  tablet Take 1 tablet (75 mg total) by mouth daily. 02/28/16   Alferd Apa Lowne Chase, DO  eszopiclone (LUNESTA) 2 MG TABS tablet Take 1 tablet (2 mg total) by mouth at bedtime as needed. for sleep 08/05/15   Debbrah Alar, NP  fenofibrate 160 MG tablet TAKE 1 TABLET EVERY DAY 06/15/16   Alferd Apa Lowne Chase, DO  LORazepam (ATIVAN) 1 MG tablet TAKE 1 TABLET AT BEDTIME AS NEEDED FOR ANXIETY  OR FOR SLEEP 06/24/16   Alferd Apa Lowne Chase, DO  losartan (COZAAR) 50 MG tablet TAKE 1 TABLET EVERY DAY 06/15/16   Alferd Apa Lowne Chase, DO  lovastatin (MEVACOR) 20 MG tablet TAKE 1 TABLET BY MOUTH EVERY NIGHT AT BEDTIME 06/22/16   Alferd Apa Lowne Chase, DO  metoprolol (LOPRESSOR) 100 MG tablet TAKE 1 TABLET TWICE DAILY 06/15/16   Rosalita Chessman Chase, DO  potassium chloride (MICRO-K) 10 MEQ CR capsule TAKE 2 CAPSULES BY MOUTH DAILY 05/20/16   Rosalita Chessman Chase, DO  prednisoLONE acetate (PRED FORTE) 1 % ophthalmic suspension 1 drop 4 (four) times daily.    Historical Provider, MD  tiZANidine (ZANAFLEX) 4 MG tablet TAKE 2 TABLETS BY MOUTH TWICE DAILY 06/10/16   Debbrah Alar, NP  valsartan (DIOVAN) 320 MG tablet Take 1 tablet (320 mg total) by mouth daily. 06/24/16   Ann Held, DO      Family History  Problem Relation Age of Onset  . Heart disease Mother   . Diabetes Mother   . Heart disease Father   . Diabetes Father   . Cancer Sister 51    ovarian  . Heart disease Brother   . Diabetes Brother   . Heart disease Maternal Grandmother   . Heart disease Maternal Grandfather   . Heart disease Paternal Grandmother   . Heart disease Paternal Grandfather   . Lung cancer Brother   . Heart disease Brother      Social History  Substance Use Topics  . Smoking status: Current Every Day Smoker    Packs/day: 2.00    Years: 30.00    Types: Cigarettes    Last attempt to quit: 04/07/2014  . Smokeless tobacco: Never Used     Comment: Trying to quit again.  . Alcohol use No     Allergies      Azor [amlodipine-olmesartan] and Trintellix [vortioxetine]    Review of Systems  10 Systems reviewed and are negative for acute change except as noted in the HPI.   Physical Exam Updated Vital Signs BP 200/96 (BP Location: Left Arm)   Pulse 64   Temp 98.3 F (36.8 C) (Oral)   Resp 20   SpO2 98%   Physical Exam  Constitutional: She is oriented to person, place, and time. She appears  well-developed and well-nourished. No distress.  HENT:  Head: Normocephalic and atraumatic.  Mouth/Throat: Oropharynx is clear and moist.  Moist mucous membranes  Eyes: Conjunctivae are normal.  Blindness R eye  Neck: Neck supple.  Cardiovascular: Normal rate, regular rhythm and normal heart sounds.   Pulmonary/Chest: Effort normal and breath sounds normal.  Abdominal: Soft. Bowel sounds are normal. She exhibits no distension. There is no tenderness.  Musculoskeletal: She exhibits no edema.  Neurological: She is alert and oriented to person, place, and time.  Fluent speech  Skin: Skin is warm and dry.  Psychiatric:  anxious  Nursing note and vitals reviewed.     ED Treatments / Results  Labs (all labs ordered are listed, but only abnormal results are displayed) Labs Reviewed - No data to display   EKG  EKG Interpretation  Date/Time:  Thursday June 30 2016 07:24:09 EST Ventricular Rate:  54 PR Interval:    QRS Duration: 96 QT Interval:  536 QTC Calculation: 508 R Axis:   59 Text Interpretation:  Sinus rhythm Abnormal T, consider ischemia, anterior leads T wave abnormalities in V2-V3 similar to previous tracing yesterday Confirmed by LITTLE MD, Apolonio Schneiders ([id number]) on 06/30/2016 7:28:49 AM Also confirmed by LITTLE MD, RACHEL [phone]), editor Lorenda Cahill CT, [place] [phone])  on 06/30/2016 7:29:52 AM         Radiology Dg Chest 2 View  Result Date: 06/29/2016 CLINICAL DATA:  Shortness of breath. EXAM: CHEST  2 VIEW COMPARISON:  One 04/04/2015. FINDINGS: Heart size normal. Mediastinum  and hilar structures are normal. Low lung volumes. Left base pleuroparenchymal thickening noted consistent with scarring. No pleural effusion or pneumothorax. Heart size normal. IMPRESSION: Low lung volumes. Left base pleuroparenchymal thickening again noted consistent with scarring . Electronically Signed   By: Marcello Moores  Register   On: 06/29/2016 14:31    Procedures Procedures (including critical care time) Procedures  Medications Ordered in ED  Medications - No data to display   Initial Impression / Assessment and Plan / ED Course  I have reviewed the triage vital signs and the nursing notes.    Clinical Course     Pt w/ ongoing HTN despite recent increase in medicines. Anxious but well appearing on exam. BP during my exam was 970Y systolic. She does report taking dextromethorphan-containing OTC medicines which may be exacerbating her BP problems. She denies any CP, SOB, or other sx to suggest hypertensive urgency/emergency and her EKG is stable from previous. Instructed to discontinue OTC cold medicines and f/u with PCP this morning. Reviewed Return precautions. Patient voiced understanding and was discharged in satisfactory condition.  Final Clinical Impressions(s) / ED Diagnoses   Final diagnoses:  Essential hypertension     New Prescriptions   No medications on file       Sharlett Iles, MD 06/30/16 [phone]

## 2016-06-30 NOTE — Progress Notes (Signed)
Chief Complaint  Patient presents with  . Hypertension    pt BP reading high-pt brought in BP readings    Subjective Sandra [name] is a 56 y.o. female who presents for hypertension follow up. She does monitor home blood pressures. Blood pressures ranging from 160-190''s/90-100's on average. She is compliant with medications. Patient has these side effects of medication: none She is not adhering to a healthy diet overall. Current exercise: No dedicated time for exercise Seen in ED yesterday, neg chest pain workup. Did have K of 3.2.   Past Medical History:  Diagnosis Date  . Acute right MCA stroke ([place]) 01/27/11   Acute R MCA stroked  with L arm and leg weakness  . Anxiety 1980s   On Klonipin since age 70. Had addiction problem with Xanax which  Was therefore d/c . Seen in the past at [facility].   . Arthritis 04/16/2011  . Blind right eye   . CAD (coronary artery disease) 2007   istory of MI with stent in 2007 by Dr. Chancy Milroy, [facility]. Stent placement by Dr Cleatis Polka   . Depression 03/07/2011  . Fracture of fifth toe, left, closed 01/28/2011   History of left fifth toe proximal phalanx fracture when patient had a stroke (01/2011) and fell. Seen by Dr Doran Durand.    . Fractured toe 01/2011   History of left fifth toe proximal phalanx fracture when patient had a stroke (01/2011) and fell. Seen by Dr Doran Durand.   . Hyperlipidemia 03/07/2011  . Hypertension 03/07/2011  . Incontinence 04/14/2011  . Myocardial infarct, old 2008   stents  . PFO (patent foramen ovale) August 2012   PFO seen on TEE during hospitalization in 01/2011.  Patient to f/u with Dr. Leonie Man, neurology, for enrollment in trial for medical treatment of PFO  . Pneumonia   . Prediabetes 03/07/2011  . Psoriasis 03/07/2011  . Substance abuse    h/o narcotic abuse pt denies as of 01/09/12  . Tobacco abuse 03/07/2011  . Tobacco abuse 03/07/2011   Quit 01/2011    Family History  Problem Relation Age of Onset  . Heart disease  Mother   . Diabetes Mother   . Heart disease Father   . Diabetes Father   . Cancer Sister 29    ovarian  . Heart disease Brother   . Diabetes Brother   . Heart disease Maternal Grandmother   . Heart disease Maternal Grandfather   . Heart disease Paternal Grandmother   . Heart disease Paternal Grandfather   . Lung cancer Brother   . Heart disease Brother      Medications Current Outpatient Prescriptions on File Prior to Visit  Medication Sig Dispense Refill  . allopurinol (ZYLOPRIM) 300 MG tablet Take 1 tablet (300 mg total) by mouth daily. 30 tablet 5  . amLODipine (NORVASC) 5 MG tablet Take 2 tablets (10 mg total) by mouth daily. 180 tablet 1  . atropine 1 % ophthalmic solution     . clopidogrel (PLAVIX) 75 MG tablet Take 1 tablet (75 mg total) by mouth daily. 90 tablet 3  . eszopiclone (LUNESTA) 2 MG TABS tablet Take 1 tablet (2 mg total) by mouth at bedtime as needed. for sleep 30 tablet 0  . fenofibrate 160 MG tablet TAKE 1 TABLET EVERY DAY 90 tablet 1  . LORazepam (ATIVAN) 1 MG tablet TAKE 1 TABLET AT BEDTIME AS NEEDED FOR ANXIETY  OR FOR SLEEP 90 tablet 1  . losartan (COZAAR) 50 MG tablet TAKE 1  TABLET EVERY DAY 90 tablet 1  . lovastatin (MEVACOR) 20 MG tablet TAKE 1 TABLET BY MOUTH EVERY NIGHT AT BEDTIME 90 tablet 2  . metoprolol (LOPRESSOR) 100 MG tablet TAKE 1 TABLET TWICE DAILY 180 tablet 1  . potassium chloride (MICRO-K) 10 MEQ CR capsule TAKE 2 CAPSULES BY MOUTH DAILY 60 capsule 2  . prednisoLONE acetate (PRED FORTE) 1 % ophthalmic suspension 1 drop 4 (four) times daily.    Marland Kitchen tiZANidine (ZANAFLEX) 4 MG tablet TAKE 2 TABLETS BY MOUTH TWICE DAILY 270 tablet 0  . valsartan (DIOVAN) 320 MG tablet Take 1 tablet (320 mg total) by mouth daily. 30 tablet 0   Allergies Allergies  Allergen Reactions  . Azor [Amlodipine-Olmesartan] Other (See Comments)    Drops blood pressure too low (takes plain amlodipine at home July 2015)  . Trintellix [Vortioxetine] Itching    Review  of Systems Cardiovascular: no chest pain Respiratory:  no shortness of breath  Exam BP (!) 170/80 (BP Location: Right Arm, Patient Position: Sitting, Cuff Size: Normal)   Pulse 66   Temp 98.5 F (36.9 C) (Oral)   Ht 5' 5"  (1.651 m)   Wt 181 lb 6.4 oz (82.3 kg)   SpO2 98%   BMI 30.19 kg/m  General:  well developed, well nourished, in no apparent distress Skin:  warm, no pallor or diaphoresis Eyes:  pupils equal and round, sclera anicteric without injection Heart :RRR, no bruits, no LE edema Lungs:  clear to auscultation, no accessory muscle use Psych: well oriented with normal range of affect and appropriate judgment/insight  Essential hypertension - Plan: Basic Metabolic Panel (BMET), spironolactone (ALDACTONE) 25 MG tablet  Hypokalemia  Orders as above. Counseled on diet and exercise. She promised me she is going to stop smoking, along with her husband. F/u in 1 mo. Recheck lytes in 1 week. The patient voiced understanding and agreement to the plan.  [place], DO 06/30/16  10:35 AM

## 2016-06-30 NOTE — Patient Instructions (Addendum)
Keep your blood pressure over the next 4 weeks and bring in your log to your appointment.  I commend for your smoking cessation efforts.   You do not need to be fasting for your lab draw in 1 week.

## 2016-07-04 ENCOUNTER — Other Ambulatory Visit: Payer: Self-pay | Admitting: Family Medicine

## 2016-07-04 DIAGNOSIS — I5189 Other ill-defined heart diseases: Secondary | ICD-10-CM

## 2016-07-08 ENCOUNTER — Other Ambulatory Visit: Payer: Medicare HMO

## 2016-07-11 ENCOUNTER — Other Ambulatory Visit (INDEPENDENT_AMBULATORY_CARE_PROVIDER_SITE_OTHER): Payer: Medicare HMO

## 2016-07-11 DIAGNOSIS — I10 Essential (primary) hypertension: Secondary | ICD-10-CM | POA: Diagnosis not present

## 2016-07-11 LAB — BASIC METABOLIC PANEL
BUN: 19 mg/dL (ref 6–23)
CALCIUM: 9.7 mg/dL (ref 8.4–10.5)
CO2: 24 mEq/L (ref 19–32)
CREATININE: 0.78 mg/dL (ref 0.40–1.20)
Chloride: 109 mEq/L (ref 96–112)
GFR: 81.51 mL/min (ref >60.00)
Glucose, Bld: 122 mg/dL — ABNORMAL HIGH (ref 70–99)
Potassium: 4.4 mEq/L (ref 3.5–5.1)
Sodium: 139 mEq/L (ref 135–145)

## 2016-07-12 ENCOUNTER — Telehealth: Payer: Self-pay | Admitting: Family Medicine

## 2016-07-12 NOTE — Telephone Encounter (Signed)
bp control is important Because it is grade 2 I want her to see cardiology

## 2016-07-12 NOTE — Telephone Encounter (Signed)
Patient informed of PCP instructions. 

## 2016-07-12 NOTE — Telephone Encounter (Signed)
Enter error. LB 

## 2016-07-12 NOTE — Telephone Encounter (Signed)
This patient called with concerns over the Echo results.  She is just wanting to make sure the findings were not serious/would they require surgery?? Or just changing of medication possible. She stated she just would like a more detailed explanation of the finding, but an explanation she can understand. Thanks,

## 2016-07-15 ENCOUNTER — Ambulatory Visit: Payer: Medicare HMO | Admitting: Family Medicine

## 2016-07-21 ENCOUNTER — Other Ambulatory Visit: Payer: Self-pay | Admitting: Family

## 2016-07-21 ENCOUNTER — Other Ambulatory Visit: Payer: Self-pay | Admitting: Family Medicine

## 2016-07-21 IMAGING — MR MR LUMBAR SPINE W/O CM
5 series · 32 of 48 positions shown · non-contrast
Comparison: 05/04/2015

CLINICAL DATA: Low back pain for the last 2 months. No known
injury. Weakness.

EXAM:
MRI LUMBAR SPINE WITHOUT CONTRAST
TECHNIQUE: Multiplanar, multisequence MR imaging of the lumbar spine was
performed. No intravenous contrast was administered.

[Series 2: T1 · sagittal · 4.0mm · 0.51mm/px · 7 of 14 slices shown (1 of 2)]
[im 1/14]
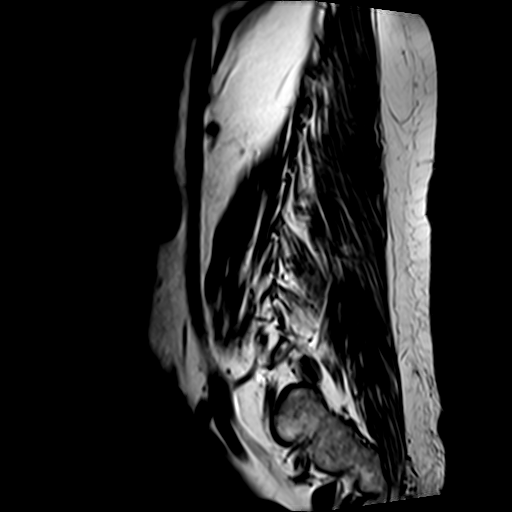
[im 3/14]
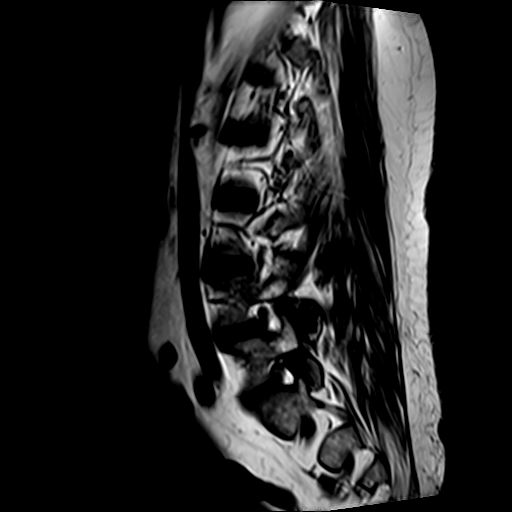
[im 5/14]
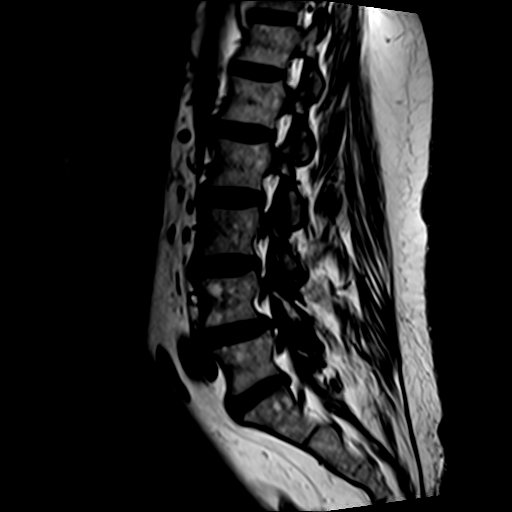
[im 7/14]
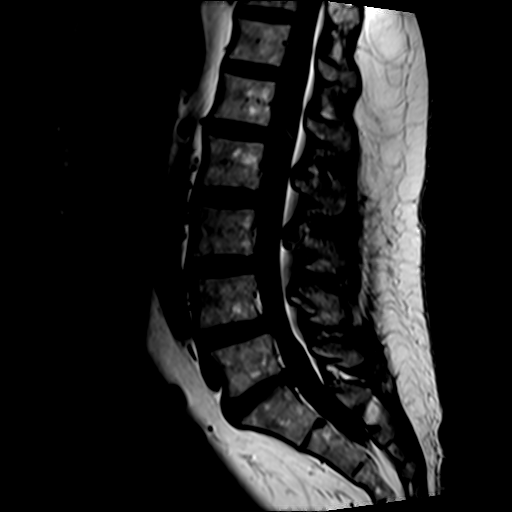
[im 9/14]
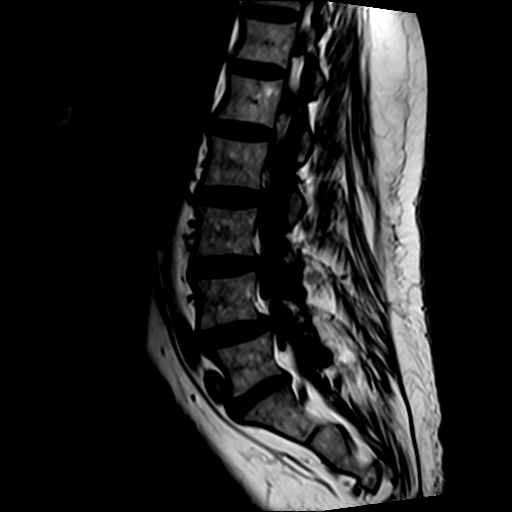
[im 11/14]
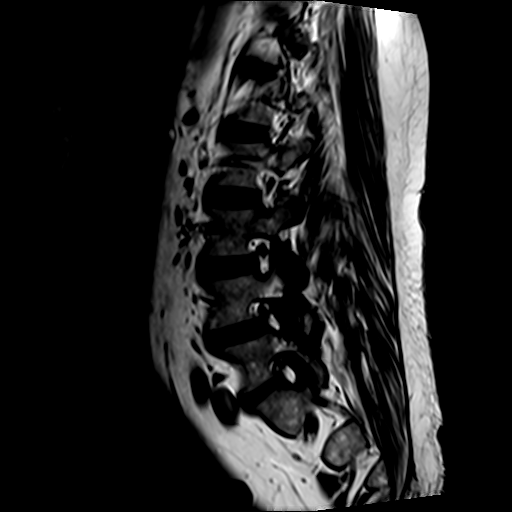
[im 14/14]
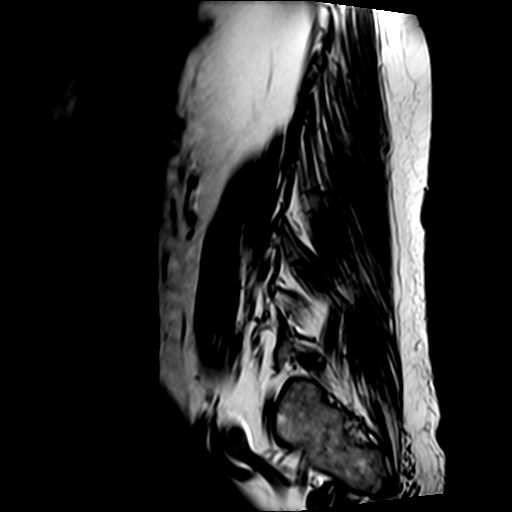

[Series 3: T2 · sagittal · 4.0mm · 0.81mm/px · 7 of 14 slices shown (1 of 2)]
[im 1/14]
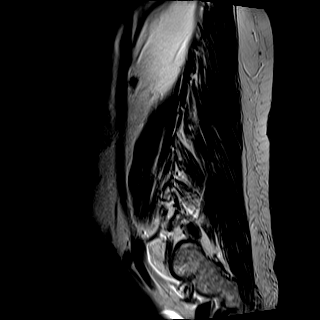
[im 3/14]
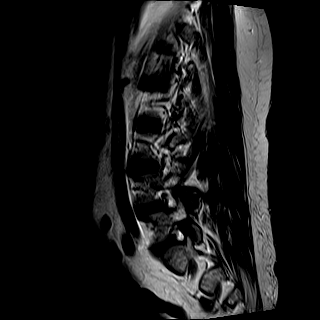
[im 5/14]
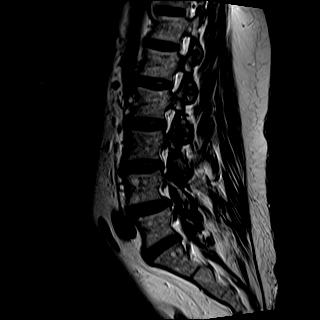
[im 7/14]
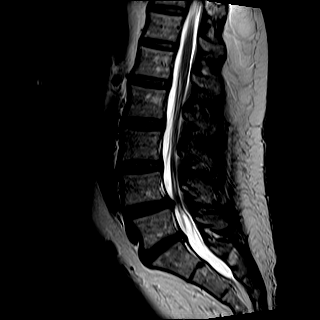
[im 9/14]
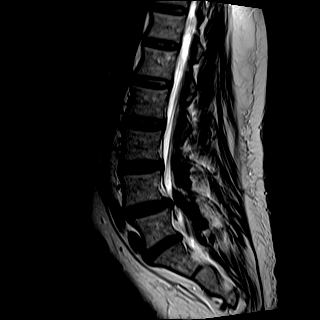
[im 11/14]
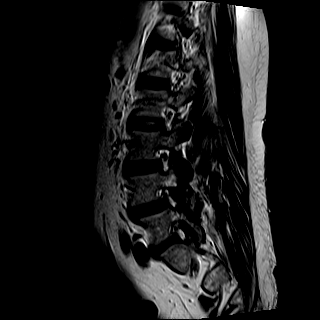
[im 14/14]
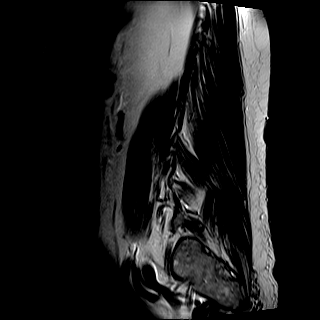

[Series 4: STIR · sagittal · 4.0mm · 1.02mm/px · 2 of 14 slices shown]
[im 1/14]
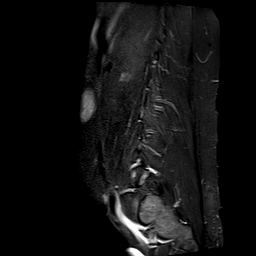
[im 3/14]
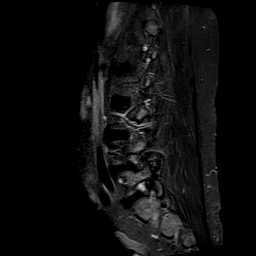

[Series 5: T2 · axial · 4.0mm · 0.39mm/px · z∈[-150,+44]mm · 8 of 32 slices shown (2 of 2)]
[im 1/32]
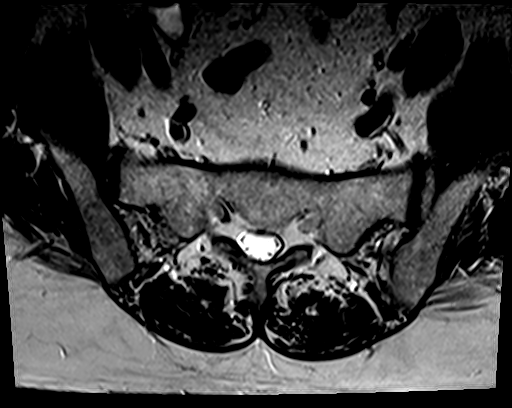
[im 5/32]
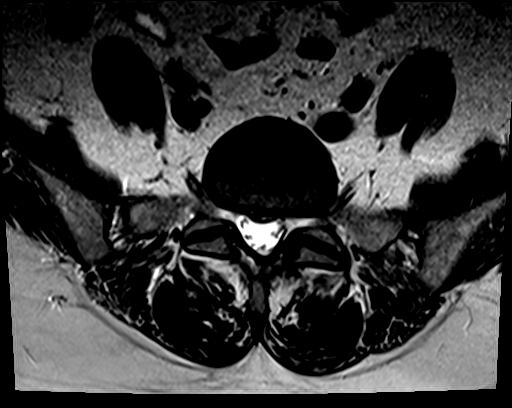
[im 10/32]
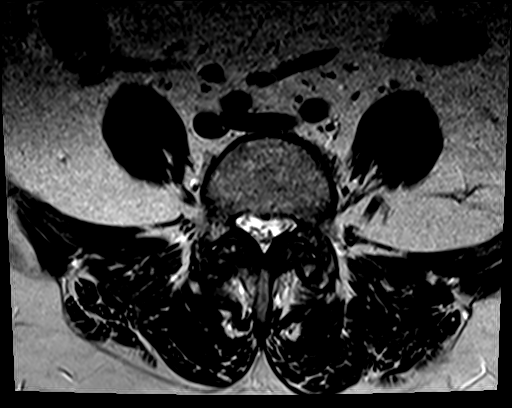
[im 15/32]
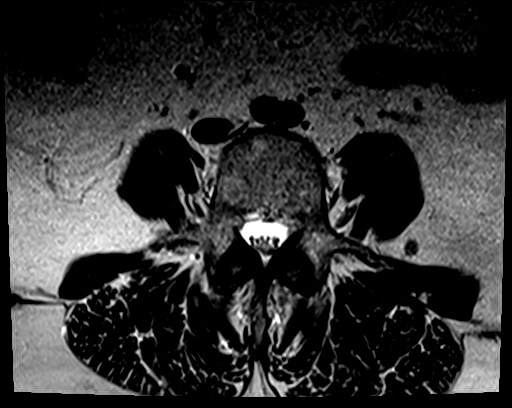
[im 17/32]
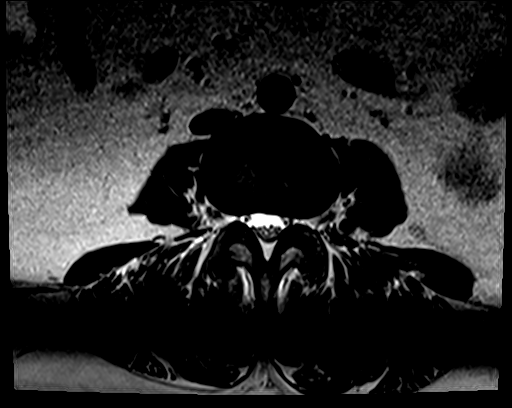
[im 22/32]
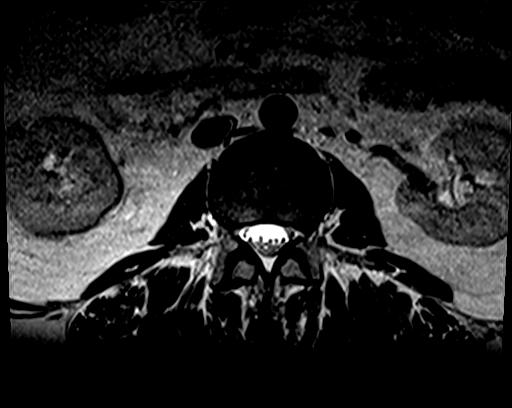
[im 27/32]
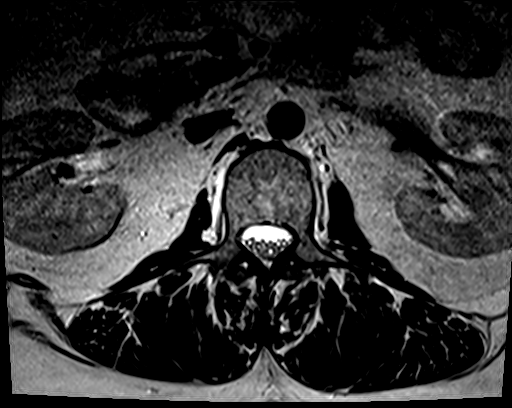
[im 32/32]
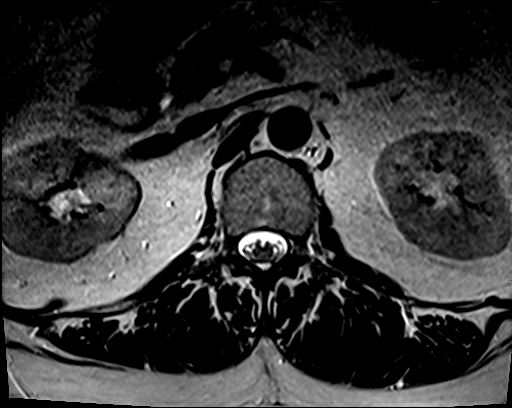

[Series 6: T1 · axial · 4.0mm · 0.78mm/px · z∈[-150,+44]mm · 8 of 32 slices shown (2 of 2)]
[im 1/32]
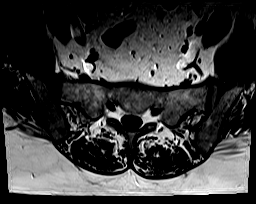
[im 5/32]
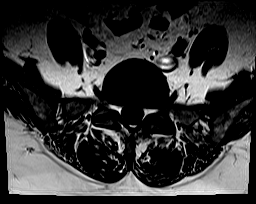
[im 10/32]
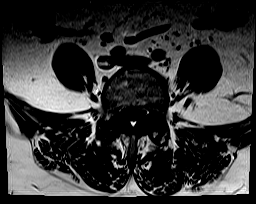
[im 15/32]
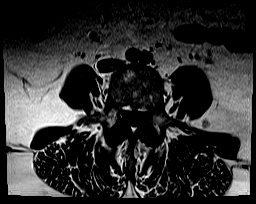
[im 17/32]
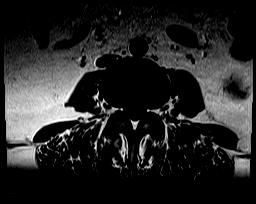
[im 22/32]
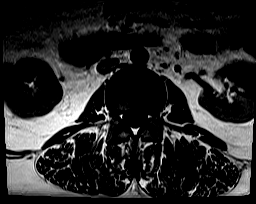
[im 27/32]
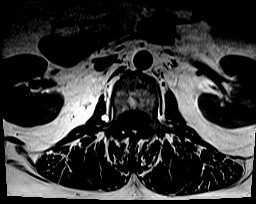
[im 32/32]
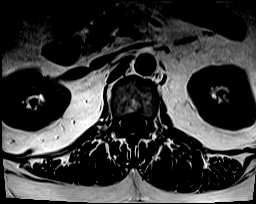

[32 of 48 positions shown; findings below may reference images not displayed]

FINDINGS: The lowest lumbar type non-rib-bearing vertebra is labeled as L5.
The conus medullaris appears normal. Conus level: Upper L2 level.

There is 5 mm of degenerative grade 1 anterolisthesis at L4-5,
without pars defects. Degenerative facet edema noted at this level.

Disc desiccation at L4-5 and L5-S1. 6 by 9 mm nodule in the left
retroperitoneum on image 17 series 5, nonspecific but probably a
lymph node or a venous varix.

Additional findings at individual levels are as follows:

L1-2:  Unremarkable.

L2-3:  No impingement.  Minimal disc bulge.

L3-4:  No impingement.  Mild disc bulge.

L4-5: Moderate central narrowing of the thecal sac with mild to
moderate bilateral subarticular lateral recess stenosis and mild
bilateral foraminal stenosis due to disc uncovering, disc bulge, and
facet arthropathy.

L5-S1: Mild bilateral foraminal stenosis due to facet arthropathy,
mild intervertebral spurring, and disc bulge. A central disc
protrusion extends caudad.
IMPRESSION: 1. Lumbar spondylosis and degenerative disc disease, causing
moderate impingement at L4-5 and mild impingement at L5-S1 as
detailed above. There is 5 mm of degenerative grade 1
anterolisthesis at L4-5.
2. Small subcentimeter left retroperitoneal nodule observed adjacent
to the psoas muscle, probably a small venous varix or lymph node.

## 2016-07-22 NOTE — Telephone Encounter (Signed)
Last OV:  06/24/16 with PCP Next OV: 08/26/16 Last RF:  06/10/16, #[id number] UDS:  06/03/17, moderate. Due 09/01/16.  No CSC signed.  Please advise tizanidine request?

## 2016-07-22 NOTE — Progress Notes (Signed)
HPI: 56 year old female for evaluation of diastolic dysfunction. Echocardiogram January 2018 showed normal LV systolic function, moderate left ventricular hypertrophy, grade 2 diastolic dysfunction. Patient also seen recently in the emergency room for hypertension. Laboratories 07/11/2016 showed sodium 139, potassium 4.4, BUN 19 and creatinine 0.78. Patient states that she had PCI in [place] 15 years ago for myocardial infarction. No records available. She has dyspnea with more extreme activities but not routine activities. No orthopnea, PND, pedal edema, chest pain or syncope.  Current Outpatient Prescriptions  Medication Sig Dispense Refill  . allopurinol (ZYLOPRIM) 300 MG tablet Take 1 tablet (300 mg total) by mouth daily. 30 tablet 5  . amLODipine (NORVASC) 5 MG tablet Take 2 tablets (10 mg total) by mouth daily. 180 tablet 1  . atropine 1 % ophthalmic solution     . clopidogrel (PLAVIX) 75 MG tablet Take 1 tablet (75 mg total) by mouth daily. 90 tablet 3  . eszopiclone (LUNESTA) 2 MG TABS tablet Take 1 tablet (2 mg total) by mouth at bedtime as needed. for sleep 30 tablet 0  . fenofibrate 160 MG tablet TAKE 1 TABLET EVERY DAY 90 tablet 1  . LORazepam (ATIVAN) 1 MG tablet TAKE 1 TABLET AT BEDTIME AS NEEDED FOR ANXIETY  OR FOR SLEEP 90 tablet 1  . lovastatin (MEVACOR) 20 MG tablet TAKE 1 TABLET BY MOUTH EVERY NIGHT AT BEDTIME 90 tablet 2  . metoprolol (LOPRESSOR) 100 MG tablet TAKE 1 TABLET TWICE DAILY 180 tablet 1  . potassium chloride (MICRO-K) 10 MEQ CR capsule TAKE 2 CAPSULES BY MOUTH DAILY 60 capsule 2  . prednisoLONE acetate (PRED FORTE) 1 % ophthalmic suspension 1 drop 4 (four) times daily.    Marland Kitchen spironolactone (ALDACTONE) 25 MG tablet Take 1 tablet (25 mg total) by mouth daily. 30 tablet 1  . tiZANidine (ZANAFLEX) 4 MG tablet TAKE 2 TABLETS BY MOUTH TWICE DAILY 270 tablet 1  . valsartan (DIOVAN) 320 MG tablet TAKE ONE TABLET BY MOUTH ONCE DAILY 30 tablet 3   No current  facility-administered medications for this visit.     Allergies  Allergen Reactions  . Azor [Amlodipine-Olmesartan] Other (See Comments)    Drops blood pressure too low (takes plain amlodipine at home July 2015)  . Trintellix [Vortioxetine] Itching     Past Medical History:  Diagnosis Date  . Acute right MCA stroke ([place]) 01/27/11   Acute R MCA stroked  with L arm and leg weakness  . Anxiety 1980s   On Klonipin since age 75. Had addiction problem with Xanax which  Was therefore d/c . Seen in the past at [facility].   . Arthritis 04/16/2011  . Blind right eye   . CAD (coronary artery disease) 2007   istory of MI with stent in 2007 by Dr. Chancy Milroy, [facility] Stent placement by Dr Cleatis Polka   . Depression 03/07/2011  . Fracture of fifth toe, left, closed 01/28/2011   History of left fifth toe proximal phalanx fracture when patient had a stroke (01/2011) and fell. Seen by Dr Doran Durand.    . Fractured toe 01/2011   History of left fifth toe proximal phalanx fracture when patient had a stroke (01/2011) and fell. Seen by Dr Doran Durand.   . Hyperlipidemia 03/07/2011  . Hypertension 03/07/2011  . Incontinence 04/14/2011  . Myocardial infarct, old 2008   stents  . PFO (patent foramen ovale) August 2012   PFO seen on TEE during hospitalization in 01/2011.  Patient to f/u with Dr.  Leonie Man, neurology, for enrollment in trial for medical treatment of PFO  . Pneumonia   . Prediabetes 03/07/2011  . Psoriasis 03/07/2011  . Substance abuse    h/o narcotic abuse pt denies as of 01/09/12  . Tobacco abuse 03/07/2011   Quit 01/2011     Past Surgical History:  Procedure Laterality Date  . cardiac stent     2008  . EYE SURGERY    . IR GENERIC HISTORICAL  03/16/2016   IR RADIOLOGIST EVAL & MGMT 03/16/2016 Marybelle Killings, MD [facility] INTERV RAD    Social History   Social History  . Marital status: Married    Spouse name: dexter  . Number of children: 1  . Years of education: 47   Occupational History    . Disabled    Social History Main Topics  . Smoking status: Former Smoker    Packs/day: 2.00    Years: 30.00    Types: Cigarettes  . Smokeless tobacco: Never Used     Comment: Trying to quit again.  . Alcohol use No  . Drug use: No  . Sexual activity: Yes    Partners: Male    Birth control/ protection: None   Other Topics Concern  . Not on file   Social History Narrative   Pt married and lives with husband.     Family History  Problem Relation Age of Onset  . Heart disease Mother   . Diabetes Mother   . Heart disease Father   . Diabetes Father   . Cancer Sister 46    ovarian  . Heart disease Brother   . Diabetes Brother   . Heart disease Maternal Grandmother   . Heart disease Maternal Grandfather   . Heart disease Paternal Grandmother   . Heart disease Paternal Grandfather   . Lung cancer Brother   . Heart disease Brother     ROS: Some residual weakness in left upper extremity from prior CVA but no fevers or chills, productive cough, hemoptysis, dysphasia, odynophagia, melena, hematochezia, dysuria, hematuria, rash, seizure activity, orthopnea, PND, pedal edema, claudication. Remaining systems are negative.  Physical Exam:   Blood pressure (!) 183/92, pulse 75, height 5' 5"  (1.651 m), weight 187 lb 12.8 oz (85.2 kg).  General:  Well developed/well nourished in NAD Skin warm/dry Patient not depressed No peripheral clubbing Back-normal HEENT-blind in right eye from previous infection. Normal eyelids. Neck supple/normal carotid upstroke bilaterally; no bruits; no JVD; no thyromegaly chest - CTA/ normal expansion CV - RRR/normal S1 and S2; no rubs or gallops;  PMI nondisplaced, 2/6 systolic murmur LSB Abdomen -NT/ND, no HSM, no mass, + bowel sounds, no bruit 2+ femoral pulses, no bruits Ext-no edema, chords, 2+ DP Neuro-grossly nonfocal  ECG 06/30/2016-sinus bradycardia with anterior T-wave inversion  A/P  1 coronary artery disease-continue Plavix and  statin. Schedule Lexiscan nuclear study for risk stratification.  2 hypertension-blood pressure is elevated. Add clonidine 0.1 mg twice a day and adjust regimen as needed.  3 left ventricular hypertrophy/diastolic dysfunction-noted on echocardiogram. She is not having CHF symptoms. We will optimize blood pressure control.   4 hyperlipidemia-patient with history of myalgias to Lipitor. I would like for her to be on high-dose statin given history of coronary disease. Discontinue Mevacor and fenofibrate. Add Crestor 40 mg daily. Check lipids and liver in 4 weeks.   5 tobacco abuse-patient counseled on discontinuing.   6 history of CVA and patent foramen ovale-this occurred in 2012 apparently no recurrences. Continue Plavix. Given duration  without recurrent events I will not refer for PFO closure.    Kirk Ruths, MD

## 2016-07-27 ENCOUNTER — Ambulatory Visit (INDEPENDENT_AMBULATORY_CARE_PROVIDER_SITE_OTHER): Payer: Medicare HMO | Admitting: Cardiology

## 2016-07-27 ENCOUNTER — Encounter: Payer: Self-pay | Admitting: Cardiology

## 2016-07-27 VITALS — BP 183/92 | HR 75 | Ht 65.0 in | Wt 187.8 lb

## 2016-07-27 DIAGNOSIS — I10 Essential (primary) hypertension: Secondary | ICD-10-CM | POA: Diagnosis not present

## 2016-07-27 DIAGNOSIS — E7800 Pure hypercholesterolemia, unspecified: Secondary | ICD-10-CM

## 2016-07-27 DIAGNOSIS — I2510 Atherosclerotic heart disease of native coronary artery without angina pectoris: Secondary | ICD-10-CM

## 2016-07-27 MED ORDER — CLONIDINE HCL 0.1 MG PO TABS: 0.1000 mg | ORAL_TABLET | Freq: Two times a day (BID) | ORAL | 3 refills | Status: DC

## 2016-07-27 MED ORDER — ROSUVASTATIN CALCIUM 40 MG PO TABS: 40.0000 mg | ORAL_TABLET | Freq: Every day | ORAL | 3 refills | Status: DC

## 2016-07-27 NOTE — Patient Instructions (Signed)
Medication Instructions:   STOP LOVASTATIN  STOP FENOFIBRATE  START ROSUVASTATIN 40 MG ONCE DAILY  START CLONIDINE 0.1 MG ONE TABLET TWICE DAILY  Labwork:  Your physician recommends that you return for lab work in: 4 WEEKS= DO NOT EAT PRIOR TO LAB WORK=CAN HAVE DRAWN AT [facility] IN [place]  Testing/Procedures:  Your physician has requested that you have a lexiscan myoview. For further information please visit [email or web address]. Please follow instruction sheet, as given.    Follow-Up:  Your physician wants you to follow-up in: [place] will receive a reminder letter in the mail two months in advance. If you don't receive a letter, please call our office to schedule the follow-up appointment.

## 2016-07-28 ENCOUNTER — Ambulatory Visit: Payer: Medicare HMO | Admitting: Family Medicine

## 2016-08-18 ENCOUNTER — Other Ambulatory Visit: Payer: Self-pay | Admitting: Family Medicine

## 2016-08-18 DIAGNOSIS — Z1231 Encounter for screening mammogram for malignant neoplasm of breast: Secondary | ICD-10-CM

## 2016-08-19 ENCOUNTER — Other Ambulatory Visit: Payer: Self-pay | Admitting: Family Medicine

## 2016-08-19 ENCOUNTER — Telehealth (HOSPITAL_COMMUNITY): Payer: Self-pay

## 2016-08-19 DIAGNOSIS — I10 Essential (primary) hypertension: Secondary | ICD-10-CM

## 2016-08-19 NOTE — Telephone Encounter (Signed)
Encounter complete. 

## 2016-08-22 ENCOUNTER — Other Ambulatory Visit: Payer: Self-pay | Admitting: Family

## 2016-08-23 ENCOUNTER — Other Ambulatory Visit: Payer: Self-pay | Admitting: Family Medicine

## 2016-08-23 DIAGNOSIS — I10 Essential (primary) hypertension: Secondary | ICD-10-CM

## 2016-08-23 MED ORDER — SPIRONOLACTONE 25 MG PO TABS: 25.0000 mg | ORAL_TABLET | Freq: Every day | ORAL | 5 refills | Status: DC

## 2016-08-23 NOTE — Telephone Encounter (Signed)
Caller name: Pamala Hurry  Relation to pt: Doctor, general practice.  Call back number: [phone] (pharmacy tel) Pharmacy: [facility], [place]  Reason for call: Technician states pt is needing refill on spironolactone (ALDACTONE) 25 MG tablet. Please advise.

## 2016-08-23 NOTE — Telephone Encounter (Signed)
Refill done as requested 

## 2016-08-24 ENCOUNTER — Inpatient Hospital Stay (HOSPITAL_COMMUNITY): Admission: RE | Admit: 2016-08-24 | Payer: Medicare HMO | Source: Ambulatory Visit

## 2016-08-26 ENCOUNTER — Ambulatory Visit (INDEPENDENT_AMBULATORY_CARE_PROVIDER_SITE_OTHER): Payer: Medicare HMO | Admitting: Family Medicine

## 2016-08-26 ENCOUNTER — Encounter: Payer: Self-pay | Admitting: Family Medicine

## 2016-08-26 VITALS — BP 162/82 | HR 59 | Temp 98.0°F | Resp 16 | Ht 65.0 in

## 2016-08-26 DIAGNOSIS — I10 Essential (primary) hypertension: Secondary | ICD-10-CM | POA: Diagnosis not present

## 2016-08-26 DIAGNOSIS — E7800 Pure hypercholesterolemia, unspecified: Secondary | ICD-10-CM

## 2016-08-26 DIAGNOSIS — F419 Anxiety disorder, unspecified: Secondary | ICD-10-CM

## 2016-08-26 DIAGNOSIS — Z23 Encounter for immunization: Secondary | ICD-10-CM

## 2016-08-26 LAB — LIPID PANEL
CHOLESTEROL: 106 mg/dL (ref 0–200)
HDL: 36.2 mg/dL — ABNORMAL LOW (ref >39.00)
LDL Cholesterol: 36 mg/dL (ref 0–99)
NonHDL: 69.55
TRIGLYCERIDES: 169 mg/dL — AB (ref 0.0–149.0)
Total CHOL/HDL Ratio: 3
VLDL: 33.8 mg/dL (ref 0.0–40.0)

## 2016-08-26 MED ORDER — CLONIDINE HCL 0.2 MG PO TABS: 0.2000 mg | ORAL_TABLET | Freq: Two times a day (BID) | ORAL | 3 refills | Status: DC

## 2016-08-26 MED ORDER — ZOSTER VAC RECOMB ADJUVANTED 50 MCG/0.5ML IM SUSR: 50.0000 ug | Freq: Once | INTRAMUSCULAR | 1 refills | Status: AC

## 2016-08-26 MED ORDER — LORAZEPAM 1 MG PO TABS: ORAL_TABLET | ORAL | 1 refills | Status: DC

## 2016-08-26 NOTE — Assessment & Plan Note (Signed)
D/c mevacor  Started crestor per cardiology--- causing muscle aches so she is now taking Mag supplement Check labs

## 2016-08-26 NOTE — Progress Notes (Signed)
Subjective:  I acted as a Education administrator for Dr. Royden Purl, LPN    [name], female    DOB: [birth year], 56 y.o.   MRN: [id number]  Chief Complaint  Patient presents with  . Hypertension    follow up    Hypertension  This is a chronic problem. The current episode started more than 1 year ago. The problem is controlled. Pertinent negatives include no blurred vision, chest pain, headaches or palpitations.    Patient is in today for follow up hypertension, hyperlipidemia. Patient state she has no concerns at this time. Patient also state her Cardiologist Dr. Jene Every recommend she have a stress test down. Patient Care Team: Ann Held, DO as PCP - General (Family Medicine) Lorretta Harp, MD as Consulting Physician (Cardiology) Garvin Fila, MD as Consulting Physician (Neurology) Alm Bustard, MD as Referring Physician (Ophthalmology)   Past Medical History:  Diagnosis Date  . Acute right MCA stroke ([place]) 01/27/11   Acute R MCA stroked  with L arm and leg weakness  . Anxiety 1980s   On Klonipin since age 64. Had addiction problem with Xanax which  Was therefore d/c . Se[facility] the [facility] at [facility].   . Arthritis 04/16/2011  . Blind right eye   . CAD (coronary artery disease) 2007   istory of MI with stent in 2007 by Dr. Chancy Milroy, [facility]. Stent placement by Dr Cleatis Polka   . Depression 03/07/2011  . Fracture of fifth toe, left, closed 01/28/2011   History of left fifth toe proximal phalanx fracture when patient had a stroke (01/2011) and fell. Seen by Dr Doran Durand.    . Fractured toe 01/2011   History of left fifth toe proximal phalanx fracture when patient had a stroke (01/2011) and fell. Seen by Dr Doran Durand.   . Hyperlipidemia 03/07/2011  . Hypertension 03/07/2011  . Incontinence 04/14/2011  . Myocardial infarct, old 2008   stents  . PFO (patent foramen ovale) August 2012   PFO seen on TEE during hospitalization in 01/2011.  Patient to f/u  with Dr. Leonie Man, neurology, for enrollment in trial for medical treatment of PFO  . Pneumonia   . Prediabetes 03/07/2011  . Psoriasis 03/07/2011  . Substance abuse    h/o narcotic abuse pt denies as of 01/09/12  . Tobacco abuse 03/07/2011   Quit 01/2011     Past Surgical History:  Procedure Laterality Date  . cardiac stent     2008  . EYE SURGERY    . IR GENERIC HISTORICAL  03/16/2016   IR RADIOLOGIST EVAL & MGMT 03/16/2016 Marybelle Killings, MD [facility] INTERV RAD    Family History  Problem Relation Age of Onset  . Heart disease Mother   . Diabetes Mother   . Heart disease Father   . Diabetes Father   . Cancer Sister 70    ovarian  . Heart disease Brother   . Diabetes Brother   . Heart disease Maternal Grandmother   . Heart disease Maternal Grandfather   . Heart disease Paternal Grandmother   . Heart disease Paternal Grandfather   . Lung cancer Brother   . Heart disease Brother     Social History   Social History  . Marital status: Married    Spouse name: dexter  . Number of children: 1  . Years of education: 61   Occupational History  . Disabled    Social History Main Topics  . Smoking status: Former Smoker  Packs/day: 2.00    Years: 30.00    Types: Cigarettes  . Smokeless tobacco: Never Used     Comment: Trying to quit again.  . Alcohol use No  . Drug use: No  . Sexual activity: Yes    Partners: Male    Birth control/ protection: None   Other Topics Concern  . Not on file   Social History Narrative   Pt married and lives with husband.     Outpatient Medications Prior to Visit  Medication Sig Dispense Refill  . allopurinol (ZYLOPRIM) 300 MG tablet TAKE 1 TABLET BY MOUTH EVERY DAY 90 tablet 1  . amLODipine (NORVASC) 5 MG tablet Take 2 tablets (10 mg total) by mouth daily. 180 tablet 1  . atropine 1 % ophthalmic solution     . clopidogrel (PLAVIX) 75 MG tablet Take 1 tablet (75 mg total) by mouth daily. 90 tablet 3  . eszopiclone (LUNESTA) 2 MG TABS tablet  Take 1 tablet (2 mg total) by mouth at bedtime as needed. for sleep 30 tablet 0  . metoprolol (LOPRESSOR) 100 MG tablet TAKE 1 TABLET TWICE DAILY 180 tablet 1  . potassium chloride (MICRO-K) 10 MEQ CR capsule TAKE 2 CAPSULES BY MOUTH DAILY 60 capsule 2  . prednisoLONE acetate (PRED FORTE) 1 % ophthalmic suspension 1 drop 4 (four) times daily.    . rosuvastatin (CRESTOR) 40 MG tablet Take 1 tablet (40 mg total) by mouth daily. 90 tablet 3  . spironolactone (ALDACTONE) 25 MG tablet Take 1 tablet (25 mg total) by mouth daily. 30 tablet 5  . tiZANidine (ZANAFLEX) 4 MG tablet TAKE 2 TABLETS BY MOUTH TWICE DAILY 270 tablet 1  . valsartan (DIOVAN) 320 MG tablet TAKE ONE TABLET BY MOUTH ONCE DAILY 30 tablet 3  . cloNIDine (CATAPRES) 0.1 MG tablet Take 1 tablet (0.1 mg total) by mouth 2 (two) times daily. 180 tablet 3  . LORazepam (ATIVAN) 1 MG tablet TAKE 1 TABLET AT BEDTIME AS NEEDED FOR ANXIETY  OR FOR SLEEP 90 tablet 1   No facility-administered medications prior to visit.     Allergies  Allergen Reactions  . Azor [Amlodipine-Olmesartan] Other (See Comments)    Drops blood pressure too low (takes plain amlodipine at home July 2015)  . Trintellix [Vortioxetine] Itching    Review of Systems  Constitutional: Negative for fever.  HENT: Negative for congestion.   Eyes: Negative for blurred vision.  Respiratory: Negative for cough.   Cardiovascular: Negative for chest pain and palpitations.  Gastrointestinal: Negative for vomiting.  Musculoskeletal: Negative for back pain.  Skin: Negative for rash.  Neurological: Negative for loss of consciousness and headaches.       Objective:    Physical Exam  Constitutional: She is oriented to person, place, and time. She appears well-developed and well-nourished. No distress.  HENT:  Head: Normocephalic and atraumatic.  Eyes: Conjunctivae are normal. Pupils are equal, round, and reactive to light.  Neck: Normal range of motion. No thyromegaly  present.  Cardiovascular: Normal rate and regular rhythm.   Pulmonary/Chest: Effort normal and breath sounds normal. She has no wheezes.  Abdominal: Soft. Bowel sounds are normal. There is no tenderness.  Musculoskeletal: Normal range of motion. She exhibits no edema or deformity.  Neurological: She is alert and oriented to person, place, and time.  Skin: Skin is warm and dry. She is not diaphoretic.  Psychiatric: She has a normal mood and affect.    BP (!) 162/82 (BP Location: Right Arm, Patient  Position: Sitting, Cuff Size: Large)   Pulse (!) 59   Temp 98 F (36.7 C) (Oral)   Resp 16   Ht 5' 5"  (1.651 m)   SpO2 97%  Wt Readings from Last 3 Encounters:  07/27/16 187 lb 12.8 oz (85.2 kg)  06/30/16 181 lb 6.4 oz (82.3 kg)  06/29/16 187 lb (84.8 kg)     Lab Results  Component Value Date   WBC 12.5 (H) 06/29/2016   HGB 13.4 06/29/2016   HCT 41.0 06/29/2016   PLT 277 06/29/2016   GLUCOSE 122 (H) 07/11/2016   CHOL 106 08/26/2016   TRIG 169.0 (H) 08/26/2016   HDL 36.20 (L) 08/26/2016   LDLDIRECT 97.0 02/25/2016   LDLCALC 36 08/26/2016   ALT 12 (L) 06/29/2016   AST 17 06/29/2016   NA 139 07/11/2016   K 4.4 07/11/2016   CL 109 07/11/2016   CREATININE 0.78 07/11/2016   BUN 19 07/11/2016   CO2 24 07/11/2016   TSH 0.46 05/29/2015   INR 1.06 06/29/2016   HGBA1C 6.2 06/09/2016   MICROALBUR 1.6 03/23/2016    Lab Results  Component Value Date   TSH 0.46 05/29/2015   Lab Results  Component Value Date   WBC 12.5 (H) 06/29/2016   HGB 13.4 06/29/2016   HCT 41.0 06/29/2016   MCV 89.7 06/29/2016   PLT 277 06/29/2016   Lab Results  Component Value Date   NA 139 07/11/2016   K 4.4 07/11/2016   CO2 24 07/11/2016   GLUCOSE 122 (H) 07/11/2016   BUN 19 07/11/2016   CREATININE 0.78 07/11/2016   BILITOT 0.4 06/29/2016   ALKPHOS 68 06/29/2016   AST 17 06/29/2016   ALT 12 (L) 06/29/2016   PROT 7.2 06/29/2016   ALBUMIN 4.2 06/29/2016   CALCIUM 9.7 07/11/2016   ANIONGAP 8  06/29/2016   GFR 81.51 07/11/2016   Lab Results  Component Value Date   CHOL 106 08/26/2016   Lab Results  Component Value Date   HDL 36.20 (L) 08/26/2016   Lab Results  Component Value Date   LDLCALC 36 08/26/2016   Lab Results  Component Value Date   TRIG 169.0 (H) 08/26/2016   Lab Results  Component Value Date   CHOLHDL 3 08/26/2016   Lab Results  Component Value Date   HGBA1C 6.2 06/09/2016       Assessment & Plan:   Problem List Items Addressed This Visit      Unprioritized   Anxiety   Relevant Medications   LORazepam (ATIVAN) 1 MG tablet   Essential hypertension - Primary (Chronic)    Inc clonidine to 0.2 mg bid  Recheck 3 months or sooner prn       Relevant Medications   cloNIDine (CATAPRES) 0.2 MG tablet   Hyperlipidemia    D/c mevacor  Started crestor per cardiology--- causing muscle aches so she is now taking Mag supplement Check labs      Relevant Medications   cloNIDine (CATAPRES) 0.2 MG tablet   Other Relevant Orders   Lipid panel (Completed)    Other Visit Diagnoses    Need for shingles vaccine          I am having Ms. Corlett start on Zoster Vac Recomb Adjuvanted. I am also having her maintain her prednisoLONE acetate, atropine, eszopiclone, clopidogrel, amLODipine, potassium chloride, metoprolol, valsartan, tiZANidine, rosuvastatin, allopurinol, spironolactone, LORazepam, and cloNIDine.  Meds ordered this encounter  Medications  . LORazepam (ATIVAN) 1 MG tablet    Sig:  TAKE 1 TABLET AT BEDTIME AS NEEDED FOR ANXIETY  OR FOR SLEEP    Dispense:  90 tablet    Refill:  1  . DISCONTD: cloNIDine (CATAPRES) 0.2 MG tablet    Sig: Take 1 tablet (0.2 mg total) by mouth 2 (two) times daily.    Dispense:  30 tablet    Refill:  3  . Zoster Vac Recomb Adjuvanted (SHINGRIX) 50 MCG SUSR    Sig: Inject 50 mcg into the muscle once. 0.5 ml IM x1  Repeat in 2-35month    Dispense:  1 each    Refill:  1  . cloNIDine (CATAPRES) 0.2 MG tablet    Sig:  Take 1 tablet (0.2 mg total) by mouth 2 (two) times daily.    Dispense:  30 tablet    Refill:  3    CMA served as scribe during this visit. History, Physical and Plan performed by medical provider. Documentation and orders reviewed and attested to.  YAnn Held DO Patient ID: NCristal [name] female   DOB: 204-16-1962 56y.o.   MRN: [id number]

## 2016-08-26 NOTE — Patient Instructions (Signed)

## 2016-08-26 NOTE — Progress Notes (Signed)
Pre visit review using our clinic review tool, if applicable. No additional management support is needed unless otherwise documented below in the visit note. 

## 2016-08-26 NOTE — Assessment & Plan Note (Signed)
Inc clonidine to 0.2 mg bid  Recheck 3 months or sooner prn

## 2016-08-29 ENCOUNTER — Telehealth: Payer: Self-pay | Admitting: Family Medicine

## 2016-08-29 ENCOUNTER — Other Ambulatory Visit: Payer: Self-pay | Admitting: Family Medicine

## 2016-08-29 DIAGNOSIS — E785 Hyperlipidemia, unspecified: Secondary | ICD-10-CM

## 2016-08-29 NOTE — Telephone Encounter (Signed)
Pt called in to request a referral for a Lung Cancer Screening

## 2016-08-30 ENCOUNTER — Encounter: Payer: Self-pay | Admitting: Family Medicine

## 2016-08-30 ENCOUNTER — Other Ambulatory Visit: Payer: Self-pay | Admitting: Family Medicine

## 2016-08-30 DIAGNOSIS — Z87891 Personal history of nicotine dependence: Secondary | ICD-10-CM

## 2016-08-30 NOTE — Telephone Encounter (Signed)
done

## 2016-08-31 ENCOUNTER — Telehealth (HOSPITAL_COMMUNITY): Payer: Self-pay

## 2016-08-31 NOTE — Telephone Encounter (Signed)
Encounter complete. 

## 2016-09-01 ENCOUNTER — Ambulatory Visit (HOSPITAL_BASED_OUTPATIENT_CLINIC_OR_DEPARTMENT_OTHER)
Admission: RE | Admit: 2016-09-01 | Discharge: 2016-09-01 | Disposition: A | Discharge status: Home / Self Care | Payer: Medicare HMO | Source: Ambulatory Visit | Attending: Family Medicine | Admitting: Family Medicine

## 2016-09-01 ENCOUNTER — Encounter (HOSPITAL_BASED_OUTPATIENT_CLINIC_OR_DEPARTMENT_OTHER): Payer: Self-pay

## 2016-09-01 DIAGNOSIS — Z1231 Encounter for screening mammogram for malignant neoplasm of breast: Secondary | ICD-10-CM | POA: Insufficient documentation

## 2016-09-02 ENCOUNTER — Other Ambulatory Visit: Payer: Self-pay | Admitting: Family Medicine

## 2016-09-02 ENCOUNTER — Inpatient Hospital Stay (HOSPITAL_COMMUNITY): Admission: RE | Admit: 2016-09-02 | Payer: Medicare HMO | Source: Ambulatory Visit

## 2016-09-02 DIAGNOSIS — R928 Other abnormal and inconclusive findings on diagnostic imaging of breast: Secondary | ICD-10-CM

## 2016-09-06 ENCOUNTER — Other Ambulatory Visit: Payer: Self-pay | Admitting: Family Medicine

## 2016-09-06 NOTE — Telephone Encounter (Signed)
Self.  Refill request for LORazepam.     Pharmacy: [place]

## 2016-09-06 NOTE — Telephone Encounter (Signed)
Informed this patient this prescription was faxed on 08/26/16 to [facility] and attached is confirmation. [employer] did get this refill and will be taking care of today to fill. #90 with 1 refill. They will mail out today and called the patient to inform refill has been taken care of. Takes 7 to 10 business day to arrive and patient is aware.

## 2016-09-07 ENCOUNTER — Other Ambulatory Visit: Payer: Self-pay | Admitting: Acute Care

## 2016-09-07 ENCOUNTER — Telehealth (HOSPITAL_COMMUNITY): Payer: Self-pay

## 2016-09-07 DIAGNOSIS — F17210 Nicotine dependence, cigarettes, uncomplicated: Secondary | ICD-10-CM

## 2016-09-07 NOTE — Telephone Encounter (Signed)
Encounter complete. 

## 2016-09-09 ENCOUNTER — Ambulatory Visit (HOSPITAL_COMMUNITY)
Admission: RE | Admit: 2016-09-09 | Discharge: 2016-09-09 | Disposition: A | Discharge status: Home / Self Care | Payer: Medicare HMO | Source: Ambulatory Visit | Attending: Cardiology | Admitting: Cardiology

## 2016-09-09 DIAGNOSIS — I2510 Atherosclerotic heart disease of native coronary artery without angina pectoris: Secondary | ICD-10-CM | POA: Insufficient documentation

## 2016-09-09 LAB — MYOCARDIAL PERFUSION IMAGING
CHL CUP NUCLEAR SDS: 1
CHL CUP NUCLEAR SRS: 4
CSEPPHR: 90 {beats}/min
LV dias vol: 111 mL (ref 46–106)
LV sys vol: 53 mL
NUC STRESS TID: 1.26
Rest HR: 51 {beats}/min
SSS: 5

## 2016-09-09 MED ORDER — REGADENOSON 0.4 MG/5ML IV SOLN
0.4000 mg | Freq: Once | INTRAVENOUS | Status: AC
  Administered 2016-09-09: 0.4 mg via INTRAVENOUS

## 2016-09-09 MED ORDER — TECHNETIUM TC 99M TETROFOSMIN IV KIT
10.7000 | PACK | Freq: Once | INTRAVENOUS | Status: AC | PRN
  Administered 2016-09-09: 10.7 via INTRAVENOUS
  Filled 2016-09-09: qty 11

## 2016-09-09 MED ORDER — TECHNETIUM TC 99M TETROFOSMIN IV KIT
31.2000 | PACK | Freq: Once | INTRAVENOUS | Status: AC | PRN
  Administered 2016-09-09: 31.2 via INTRAVENOUS
  Filled 2016-09-09: qty 32

## 2016-09-20 ENCOUNTER — Ambulatory Visit
Admission: RE | Admit: 2016-09-20 | Discharge: 2016-09-20 | Disposition: A | Discharge status: Home / Self Care | Payer: Medicare HMO | Source: Ambulatory Visit | Attending: Family Medicine | Admitting: Family Medicine

## 2016-09-20 DIAGNOSIS — R928 Other abnormal and inconclusive findings on diagnostic imaging of breast: Secondary | ICD-10-CM | POA: Diagnosis not present

## 2016-09-20 DIAGNOSIS — N6489 Other specified disorders of breast: Secondary | ICD-10-CM | POA: Diagnosis not present

## 2016-09-28 ENCOUNTER — Encounter: Payer: Self-pay | Admitting: Acute Care

## 2016-09-28 ENCOUNTER — Ambulatory Visit (INDEPENDENT_AMBULATORY_CARE_PROVIDER_SITE_OTHER)
Admission: RE | Admit: 2016-09-28 | Discharge: 2016-09-28 | Disposition: A | Discharge status: Home / Self Care | Payer: Medicare HMO | Source: Ambulatory Visit | Attending: Acute Care | Admitting: Acute Care

## 2016-09-28 ENCOUNTER — Ambulatory Visit (INDEPENDENT_AMBULATORY_CARE_PROVIDER_SITE_OTHER): Payer: Medicare HMO | Admitting: Acute Care

## 2016-09-28 DIAGNOSIS — F17210 Nicotine dependence, cigarettes, uncomplicated: Secondary | ICD-10-CM | POA: Diagnosis not present

## 2016-09-28 DIAGNOSIS — J439 Emphysema, unspecified: Secondary | ICD-10-CM | POA: Diagnosis not present

## 2016-09-28 DIAGNOSIS — Z87891 Personal history of nicotine dependence: Secondary | ICD-10-CM | POA: Diagnosis not present

## 2016-09-28 NOTE — Progress Notes (Signed)
Shared Decision Making Visit Lung Cancer Screening Program [phone])   Eligibility:  Age 56 y.o.  Pack Years Smoking History Calculation 82 pack year smoking history (# packs/per year x # years smoked)  Recent History of coughing up blood  no  Unexplained weight loss? no ( >Than 15 pounds within the last 6 months )  Prior History Lung / other cancer no (Diagnosis within the last 5 years already requiring surveillance chest CT Scans).  Smoking Status Current Smoker  Former Smokers: Years since quit:NA  Quit Date: NA  Visit Components:  Discussion included one or more decision making aids. yes  Discussion included risk/benefits of screening. yes  Discussion included potential follow up diagnostic testing for abnormal scans. yes  Discussion included meaning and risk of over diagnosis. yes  Discussion included meaning and risk of False Positives. yes  Discussion included meaning of total radiation exposure. yes  Counseling Included:  Importance of adherence to annual lung cancer LDCT screening. yes  Impact of comorbidities on ability to participate in the program. yes  Ability and willingness to under diagnostic treatment. yes  Smoking Cessation Counseling:  Current Smokers:   Discussed importance of smoking cessation. yes  Information about tobacco cessation classes and interventions provided to patient. yes  Patient provided with "ticket" for LDCT Scan. yes  Symptomatic Patient. no  Counseling  Diagnosis Code: Tobacco Use Z72.0  Asymptomatic Patient yes  Counseling (Intermediate counseling: > three minutes counseling) E5631  Former Smokers:   Discussed the importance of maintaining cigarette abstinence. yes  Diagnosis Code: Personal History of Nicotine Dependence. S97.026  Information about tobacco cessation classes and interventions provided to patient. Yes  Patient provided with "ticket" for LDCT Scan. yes  Written Order for Lung Cancer  Screening with LDCT placed in Epic. Yes (CT Chest Lung Cancer Screening Low Dose W/O CM) [id number] Z12.2-Screening of respiratory organs Z87.891-Personal history of nicotine dependence  I have spent 25 minutes of face to face time with Ms. Budreau discussing the risks and benefits of lung cancer screening. We viewed a power point together that explained in detail the above noted topics. We paused at intervals to allow for questions to be asked and answered to ensure understanding.We discussed that the single most powerful action that she  can take to decrease her risk of developing lung cancer is to quit smoking. We discussed whether or not she is ready to commit to setting a quit date. She is planning on quitting smoking today. She is planning to use patches. We discussed options for tools to aid in quitting smoking including nicotine replacement therapy, non-nicotine medications, support groups, Quit Smart classes, and behavior modification. Both she and her husband are interested in the [employer] classes.We discussed that often times setting smaller, more achievable goals, such as eliminating 1 cigarette a day for a week and then 2 cigarettes a day for a week can be helpful in slowly decreasing the number of cigarettes smoked. This allows for a sense of accomplishment as well as providing a clinical benefit. I gave her  the " Be Stronger Than Your Excuses" card with contact information for community resources, classes, free nicotine replacement therapy, and access to mobile apps, text messaging, and on-line smoking cessation help. I have also given her my card and contact information in the event she  needs to contact me. We discussed the time and location of the scan, and that either Doroteo Glassman, RN  or I will call with the results within 24-48 hours  of receiving them. I have offered her  a copy of the power point we viewed  as a resource in the event they need reinforcement of the concepts we discussed  today in the office. The patient verbalized understanding of all of  the above and had no further questions upon leaving the office. They have my contact information in the event they have any further questions.  I spent 4 minutes counseling about smoking cessation this visit.  I explained that there has been a high incidence of CAD noted on these scans.Pt. Was on statin therapy, but it was stopped per her cardiologist. She has had an MI in the past, and has had stent placement done. I explained that we will fax a copy of her scan to her PCP for completeness of her medical record.She verbalized understanding.   Magdalen Spatz, NP 09/28/2016

## 2016-10-02 ENCOUNTER — Encounter (HOSPITAL_BASED_OUTPATIENT_CLINIC_OR_DEPARTMENT_OTHER): Payer: Self-pay | Admitting: Emergency Medicine

## 2016-10-02 ENCOUNTER — Emergency Department (HOSPITAL_BASED_OUTPATIENT_CLINIC_OR_DEPARTMENT_OTHER)
Admission: EM | Admit: 2016-10-02 | Discharge: 2016-10-02 | Disposition: A | Discharge status: Home / Self Care | Payer: Medicare HMO | Attending: Emergency Medicine | Admitting: Emergency Medicine

## 2016-10-02 ENCOUNTER — Emergency Department (HOSPITAL_BASED_OUTPATIENT_CLINIC_OR_DEPARTMENT_OTHER)
Admission: EM | Admit: 2016-10-02 | Discharge: 2016-10-02 | Disposition: A | Discharge status: Home / Self Care | Payer: Medicare HMO | Source: Home / Self Care | Attending: Emergency Medicine | Admitting: Emergency Medicine

## 2016-10-02 DIAGNOSIS — I2510 Atherosclerotic heart disease of native coronary artery without angina pectoris: Secondary | ICD-10-CM

## 2016-10-02 DIAGNOSIS — R070 Pain in throat: Secondary | ICD-10-CM | POA: Diagnosis not present

## 2016-10-02 DIAGNOSIS — Z5321 Procedure and treatment not carried out due to patient leaving prior to being seen by health care provider: Secondary | ICD-10-CM | POA: Insufficient documentation

## 2016-10-02 DIAGNOSIS — Z87891 Personal history of nicotine dependence: Secondary | ICD-10-CM | POA: Insufficient documentation

## 2016-10-02 DIAGNOSIS — Z79899 Other long term (current) drug therapy: Secondary | ICD-10-CM

## 2016-10-02 DIAGNOSIS — I10 Essential (primary) hypertension: Secondary | ICD-10-CM | POA: Insufficient documentation

## 2016-10-02 DIAGNOSIS — R110 Nausea: Secondary | ICD-10-CM

## 2016-10-02 DIAGNOSIS — J3489 Other specified disorders of nose and nasal sinuses: Secondary | ICD-10-CM | POA: Diagnosis not present

## 2016-10-02 LAB — CBC WITH DIFFERENTIAL/PLATELET
Basophils Absolute: 0 10*3/uL (ref 0.0–0.1)
Basophils Relative: 0 %
Eosinophils Absolute: 0.3 10*3/uL (ref 0.0–0.7)
Eosinophils Relative: 2 %
HCT: 41.1 % (ref 36.0–46.0)
Hemoglobin: 13.9 g/dL (ref 12.0–15.0)
Lymphocytes Relative: 22 %
Lymphs Abs: 2.7 10*3/uL (ref 0.7–4.0)
MCH: 28.5 pg (ref 26.0–34.0)
MCHC: 33.8 g/dL (ref 30.0–36.0)
MCV: 84.4 fL (ref 78.0–100.0)
Monocytes Absolute: 0.8 10*3/uL (ref 0.1–1.0)
Monocytes Relative: 7 %
Neutro Abs: 8.3 10*3/uL — ABNORMAL HIGH (ref 1.7–7.7)
Neutrophils Relative %: 69 %
Platelets: 252 10*3/uL (ref 150–400)
RBC: 4.87 MIL/uL (ref 3.87–5.11)
RDW: 13.8 % (ref 11.5–15.5)
WBC: 12.1 10*3/uL — ABNORMAL HIGH (ref 4.0–10.5)

## 2016-10-02 LAB — COMPREHENSIVE METABOLIC PANEL
ALT: 15 U/L (ref 14–54)
AST: 18 U/L (ref 15–41)
Albumin: 4.3 g/dL (ref 3.5–5.0)
Alkaline Phosphatase: 88 U/L (ref 38–126)
Anion gap: 10 (ref 5–15)
BUN: 17 mg/dL (ref 6–20)
CO2: 22 mmol/L (ref 22–32)
Calcium: 9.3 mg/dL (ref 8.9–10.3)
Chloride: 110 mmol/L (ref 101–111)
Creatinine, Ser: 0.88 mg/dL (ref 0.44–1.00)
GFR calc Af Amer: >60 mL/min (ref >60)
GFR calc non Af Amer: >60 mL/min (ref >60)
Glucose, Bld: 167 mg/dL — ABNORMAL HIGH (ref 65–99)
Potassium: 4.1 mmol/L (ref 3.5–5.1)
Sodium: 142 mmol/L (ref 135–145)
Total Bilirubin: 0.1 mg/dL — ABNORMAL LOW (ref 0.3–1.2)
Total Protein: 7.2 g/dL (ref 6.5–8.1)

## 2016-10-02 LAB — LIPASE, BLOOD: Lipase: 29 U/L (ref 11–51)

## 2016-10-02 MED ORDER — SODIUM CHLORIDE 0.9 % IV BOLUS (SEPSIS)
1000.0000 mL | Freq: Once | INTRAVENOUS | Status: AC
  Administered 2016-10-02: 1000 mL via INTRAVENOUS

## 2016-10-02 MED ORDER — PROMETHAZINE HCL 25 MG PO TABS: 25.0000 mg | ORAL_TABLET | Freq: Three times a day (TID) | ORAL | 0 refills | Status: DC | PRN

## 2016-10-02 MED ORDER — ONDANSETRON HCL 4 MG/2ML IJ SOLN
4.0000 mg | Freq: Once | INTRAMUSCULAR | Status: AC
  Administered 2016-10-02: 4 mg via INTRAVENOUS
  Filled 2016-10-02: qty 2

## 2016-10-02 NOTE — ED Provider Notes (Signed)
Pt left w/o being seen by me. She eloped.   Varney Biles, MD 10/02/16 [phone]

## 2016-10-02 NOTE — ED Notes (Signed)
Pt given d/c instructions as per chart. rx x 1. Verbalizes understanding. No questions. 

## 2016-10-02 NOTE — ED Notes (Signed)
EDP in to see pt. Pt not in room.

## 2016-10-02 NOTE — ED Provider Notes (Signed)
[facility] DEPT [facility] Provider Note   CSN: [id number] Arrival date & time: 10/02/16  2001  By signing my name below, I, Dora Sims, attest that this documentation has been prepared under the direction and in the presence of [employer], PA-C . Electronically Signed: Dora Sims, Scribe. 10/02/2016. 8:34 PM.  History   Chief Complaint Chief Complaint  Patient presents with  . Nausea    The history is provided by the patient. No language interpreter was used.     HPI Comments: Sandra [name] is a 56 y.o. female with PMHx including CAD, HTN, HLD, and MI who presents to the Emergency Department complaining of persistent nausea beginning last night. She reports associated intermittent, generalized, cramping abdominal pain as well as a mild, intermittent dry cough. She states her abdominal pain presents for a few minutes at a time. No alleviating factors noted. Patient denies vomiting, diarrhea, hematochezia, or any other associated symptoms.  Past Medical History:  Diagnosis Date  . Acute right MCA stroke ([place]) 01/27/11   Acute R MCA stroked  with L arm and leg weakness  . Anxiety 1980s   On Klonipin since age 50. Had addiction problem with Xanax which  Was therefore d/c . Seen in the past at [facility]   . Arthritis 04/16/2011  . Blind right eye   . CAD (coronary artery disease) 2007   istory of MI with stent in 2007 by Dr. Chancy Milroy, [facility]. Stent placement by Dr Cleatis Polka   . Depression 03/07/2011  . Fracture of fifth toe, left, closed 01/28/2011   History of left fifth toe proximal phalanx fracture when patient had a stroke (01/2011) and fell. Seen by Dr Doran Durand.    . Fractured toe 01/2011   History of left fifth toe proximal phalanx fracture when patient had a stroke (01/2011) and fell. Seen by Dr Doran Durand.   . Hyperlipidemia 03/07/2011  . Hypertension 03/07/2011  . Incontinence 04/14/2011  . Myocardial infarct, old 2008   stents  . PFO (patent foramen ovale) August  2012   PFO seen on TEE during hospitalization in 01/2011.  Patient to f/u with Dr. Leonie Man, neurology, for enrollment in trial for medical treatment of PFO  . Pneumonia   . Prediabetes 03/07/2011  . Psoriasis 03/07/2011  . Substance abuse    h/o narcotic abuse pt denies as of 01/09/12  . Tobacco abuse 03/07/2011   Quit 01/2011     Patient Active Problem List   Diagnosis Date Noted  . Undiagnosed cardiac murmurs 06/24/2016  . Morbid obesity ([place]) 06/05/2015  . Spastic hemiplegia affecting nondominant side ([place]) 04/02/2015  . Hypokalemia 12/23/2013  . Cold intolerance 11/25/2013  . Recurrent pneumonia 11/06/2013  . MRSA bacteremia 11/06/2013  . Bilateral endophthalmitis 11/06/2013  . Metabolic syndrome 85/88/5027  . Routine adult health maintenance 08/13/2013  . Dyspareunia, female 02/06/2013  . Lumbar and sacral osteoarthritis 12/06/2012  . Diarrhea 11/15/2012  . Insomnia 07/23/2012  . HIV exposure 06/27/2012  . Gout 01/31/2012  . Benzodiazepine dependence, continuous ([place]) 01/26/2012    Class: Chronic  . Hot flashes 01/09/2012  . Tobacco abuse 01/09/2012  . Arthritis 04/16/2011  . Anxiety 04/14/2011  . Incontinence 04/14/2011  . PFO (patent foramen ovale) 03/07/2011  . CAD (coronary artery disease) 03/07/2011  . Essential hypertension 03/07/2011  . Hyperlipidemia 03/07/2011  . Depression 03/07/2011  . Psoriasis 03/07/2011  . H/O: CVA (cerebrovascular accident) 01/27/2011    Past Surgical History:  Procedure Laterality Date  . cardiac stent  2008  . EYE SURGERY    . IR GENERIC HISTORICAL  03/16/2016   IR RADIOLOGIST EVAL & MGMT 03/16/2016 Marybelle Killings, MD [facility] INTERV RAD    OB History    No data available       Home Medications    Prior to Admission medications   Medication Sig Start Date End Date Taking? Authorizing Provider  allopurinol (ZYLOPRIM) 300 MG tablet TAKE 1 TABLET BY MOUTH EVERY DAY 08/23/16   Alferd Apa Lowne Chase, DO  amLODipine (NORVASC) 5 MG  tablet Take 2 tablets (10 mg total) by mouth daily. 02/28/16   Rosalita Chessman Chase, DO  atropine 1 % ophthalmic solution  03/20/15   Historical Provider, MD  cloNIDine (CATAPRES) 0.2 MG tablet Take 1 tablet (0.2 mg total) by mouth 2 (two) times daily. 08/26/16   Rosalita Chessman Chase, DO  clopidogrel (PLAVIX) 75 MG tablet Take 1 tablet (75 mg total) by mouth daily. 02/28/16   Alferd Apa Lowne Chase, DO  eszopiclone (LUNESTA) 2 MG TABS tablet Take 1 tablet (2 mg total) by mouth at bedtime as needed. for sleep 08/05/15   Debbrah Alar, NP  LORazepam (ATIVAN) 1 MG tablet TAKE 1 TABLET AT BEDTIME AS NEEDED FOR ANXIETY  OR FOR SLEEP 08/26/16   Alferd Apa Lowne Chase, DO  metoprolol (LOPRESSOR) 100 MG tablet TAKE 1 TABLET TWICE DAILY 06/15/16   Rosalita Chessman Chase, DO  potassium chloride (MICRO-K) 10 MEQ CR capsule TAKE 2 CAPSULES BY MOUTH DAILY 05/20/16   Rosalita Chessman Chase, DO  prednisoLONE acetate (PRED FORTE) 1 % ophthalmic suspension 1 drop 4 (four) times daily.    Historical Provider, MD  rosuvastatin (CRESTOR) 40 MG tablet Take 1 tablet (40 mg total) by mouth daily. 07/27/16 10/25/16  Lelon Perla, MD  spironolactone (ALDACTONE) 25 MG tablet Take 1 tablet (25 mg total) by mouth daily. 08/23/16   Rosalita Chessman Chase, DO  tiZANidine (ZANAFLEX) 4 MG tablet TAKE 2 TABLETS BY MOUTH TWICE DAILY 07/22/16   Alferd Apa Lowne Chase, DO  valsartan (DIOVAN) 320 MG tablet TAKE ONE TABLET BY MOUTH ONCE DAILY 07/21/16   Ann Held, DO    Family History Family History  Problem Relation Age of Onset  . Heart disease Mother   . Diabetes Mother   . Heart disease Father   . Diabetes Father   . Cancer Sister 17    ovarian  . Heart disease Brother   . Diabetes Brother   . Heart disease Maternal Grandmother   . Heart disease Maternal Grandfather   . Heart disease Paternal Grandmother   . Heart disease Paternal Grandfather   . Lung cancer Brother   . Heart disease Brother     Social History Social History    Substance Use Topics  . Smoking status: Former Smoker    Packs/day: 2.00    Years: 42.00    Types: Cigarettes    Quit date: 01/31/2011  . Smokeless tobacco: Never Used     Comment: Trying to quit again.  . Alcohol use No     Allergies   Azor [amlodipine-olmesartan] and Trintellix [vortioxetine]   Review of Systems Review of Systems  All other systems reviewed and are negative for acute change except as noted in the HPI.  Physical Exam Updated Vital Signs BP (!) 164/80 (BP Location: Left Arm)   Pulse (!) 56   Temp 98.7 F (37.1 C) (Oral)   SpO2 99%   Physical  Exam  Constitutional: She is oriented to person, place, and time. She appears well-developed and well-nourished. No distress.  HENT:  Head: Normocephalic and atraumatic.  Eyes: Conjunctivae and EOM are normal.  Neck: Neck supple. No tracheal deviation present.  Cardiovascular: Normal rate.   Pulmonary/Chest: Effort normal. No respiratory distress.  Abdominal: Soft. Bowel sounds are normal. She exhibits no distension. There is no tenderness.  Musculoskeletal: Normal range of motion.  Neurological: She is alert and oriented to person, place, and time.  Skin: Skin is warm and dry.  Psychiatric: She has a normal mood and affect. Her behavior is normal.  Nursing note and vitals reviewed.  ED Treatments / Results  Labs (all labs ordered are listed, but only abnormal results are displayed) Labs Reviewed - No data to display  EKG  EKG Interpretation None       Radiology No results found.  Procedures Procedures (including critical care time)  DIAGNOSTIC STUDIES: Oxygen Saturation is 99% on RA, normal by my interpretation.    COORDINATION OF CARE: 8:37 PM Discussed treatment plan with pt at bedside and pt agreed to plan.  Medications Ordered in ED Medications - No data to display   Initial Impression / Assessment and Plan / ED Course  I have reviewed the triage vital signs and the nursing  notes.  Pertinent labs & imaging results that were available during my care of the patient were reviewed by me and considered in my medical decision making (see chart for details).    Patient has been stable here in the emergency department secondary fluids she may have a GI illness. Patient has not had any vomiting here in the emergency department patient will be discharged home to return here as needed patient agrees plan and all questions were answered Final Clinical Impressions(s) / ED Diagnoses   Final diagnoses:  None    New Prescriptions New Prescriptions   No medications on file       Dalia Heading, PA-C 10/02/16 2227    Virgel Manifold, MD 10/04/16 0030

## 2016-10-02 NOTE — Discharge Instructions (Signed)
Return here as needed follow-up with your primary care doctor. testing here tonight did not show any significant abnormalities

## 2016-10-02 NOTE — ED Notes (Signed)
Pt called in lobby. No answer.

## 2016-10-02 NOTE — ED Triage Notes (Signed)
Pt presents to ED with c/o scratchy throat, runny nose, and feeling hot that started tonight. PT states she was unable to sleep tonight.

## 2016-10-02 NOTE — ED Notes (Signed)
Husband (no longer a pt per his request) is at the bedside.

## 2016-10-02 NOTE — ED Notes (Signed)
Pt reports working outside today.

## 2016-10-12 ENCOUNTER — Telehealth: Payer: Self-pay | Admitting: Acute Care

## 2016-10-12 DIAGNOSIS — F17210 Nicotine dependence, cigarettes, uncomplicated: Principal | ICD-10-CM

## 2016-10-12 NOTE — Telephone Encounter (Signed)
Spoke with pt and informed of CT results per Maggie Schwalbe.  Also informed of need for f/u with PCP for thyroid nodule.  Pt verbalized understanding.  Copy sent to PCP.  Order placed for 1 yr f/u CT

## 2016-10-18 ENCOUNTER — Other Ambulatory Visit: Payer: Self-pay | Admitting: Family Medicine

## 2016-10-18 ENCOUNTER — Telehealth: Payer: Self-pay | Admitting: Family Medicine

## 2016-10-18 DIAGNOSIS — I10 Essential (primary) hypertension: Secondary | ICD-10-CM

## 2016-10-18 MED ORDER — METOPROLOL TARTRATE 100 MG PO TABS: 100.0000 mg | ORAL_TABLET | Freq: Two times a day (BID) | ORAL | 1 refills | Status: DC

## 2016-10-18 MED ORDER — SPIRONOLACTONE 25 MG PO TABS: 25.0000 mg | ORAL_TABLET | Freq: Every day | ORAL | 5 refills | Status: DC

## 2016-10-18 MED ORDER — POTASSIUM CHLORIDE ER 10 MEQ PO CPCR: 20.0000 meq | ORAL_CAPSULE | Freq: Every day | ORAL | 2 refills | Status: DC

## 2016-10-18 MED ORDER — CLONIDINE HCL 0.2 MG PO TABS: 0.2000 mg | ORAL_TABLET | Freq: Two times a day (BID) | ORAL | 3 refills | Status: DC

## 2016-10-18 MED ORDER — AMLODIPINE BESYLATE 5 MG PO TABS: 10.0000 mg | ORAL_TABLET | Freq: Every day | ORAL | 1 refills | Status: DC

## 2016-10-18 MED ORDER — VALSARTAN 320 MG PO TABS: 320.0000 mg | ORAL_TABLET | Freq: Every day | ORAL | 1 refills | Status: DC

## 2016-10-18 MED ORDER — ALLOPURINOL 300 MG PO TABS: 300.0000 mg | ORAL_TABLET | Freq: Every day | ORAL | 1 refills | Status: DC

## 2016-10-18 NOTE — Telephone Encounter (Signed)
Refill done.  

## 2016-10-18 NOTE — Telephone Encounter (Signed)
°  Relation to pt:Mr. Pierron (spouse) Call back number:[phone] Pharmacy: [facility] [place] [facility]  Reason for call:  Spouse states please send all prescriptions to [place], [place], due to [place], [place] unable to send Rx this month.

## 2016-10-18 NOTE — Telephone Encounter (Signed)
Error

## 2016-10-19 ENCOUNTER — Telehealth: Payer: Self-pay | Admitting: Cardiology

## 2016-10-19 MED ORDER — CLONIDINE HCL 0.2 MG PO TABS: 0.2000 mg | ORAL_TABLET | Freq: Two times a day (BID) | ORAL | 3 refills | Status: DC

## 2016-10-19 MED ORDER — ROSUVASTATIN CALCIUM 40 MG PO TABS: 40.0000 mg | ORAL_TABLET | Freq: Every day | ORAL | 3 refills | Status: DC

## 2016-10-19 NOTE — Telephone Encounter (Signed)
°*  STAT* If patient is at the pharmacy, call can be transferred to refill team.   1. Which medications need to be refilled? (please list name of each medication and dose if known) rosuvastatin 40 mg and clonidine 0.2 mg   2. Which pharmacy/location (including street and city if local pharmacy) is medication to be sent to? [place] [address]  3. Do they need a 30 day or 90 day supply? 90 day supply  Patient will use [place] from now on.

## 2016-10-19 NOTE — Telephone Encounter (Signed)
Refill sent to the pharmacy electronically.  

## 2016-10-20 ENCOUNTER — Other Ambulatory Visit: Payer: Self-pay | Admitting: Family Medicine

## 2016-10-20 DIAGNOSIS — Z8673 Personal history of transient ischemic attack (TIA), and cerebral infarction without residual deficits: Secondary | ICD-10-CM

## 2016-10-20 NOTE — Telephone Encounter (Signed)
Caller name: Dexter Relationship to patient: Spouse Can be reached: [phone]  Pharmacy:  [place], [place] [phone] (Phone) [phone] (Fax)     Reason for call: Refill clopidogrel (PLAVIX) 75 MG tablet [[id number]]

## 2016-10-21 MED ORDER — CLOPIDOGREL BISULFATE 75 MG PO TABS: 75.0000 mg | ORAL_TABLET | Freq: Every day | ORAL | 3 refills | Status: DC

## 2016-10-21 NOTE — Telephone Encounter (Signed)
Refill done and called the patient left detailed message refill done to [facility] as they asked

## 2016-11-29 ENCOUNTER — Other Ambulatory Visit: Payer: Medicare HMO

## 2016-12-15 ENCOUNTER — Other Ambulatory Visit: Payer: Medicare HMO

## 2016-12-19 ENCOUNTER — Other Ambulatory Visit (INDEPENDENT_AMBULATORY_CARE_PROVIDER_SITE_OTHER): Payer: Medicare HMO

## 2016-12-19 DIAGNOSIS — E785 Hyperlipidemia, unspecified: Secondary | ICD-10-CM

## 2016-12-19 LAB — COMPREHENSIVE METABOLIC PANEL
ALT: 11 U/L (ref 0–35)
AST: 12 U/L (ref 0–37)
Albumin: 4.4 g/dL (ref 3.5–5.2)
Alkaline Phosphatase: 87 U/L (ref 39–117)
BILIRUBIN TOTAL: 0.5 mg/dL (ref 0.2–1.2)
BUN: 15 mg/dL (ref 6–23)
CALCIUM: 9.8 mg/dL (ref 8.4–10.5)
CO2: 26 meq/L (ref 19–32)
CREATININE: 0.6 mg/dL (ref 0.40–1.20)
Chloride: 108 mEq/L (ref 96–112)
GFR: 110.15 mL/min (ref >60.00)
Glucose, Bld: 116 mg/dL — ABNORMAL HIGH (ref 70–99)
Potassium: 4.5 mEq/L (ref 3.5–5.1)
SODIUM: 142 meq/L (ref 135–145)
Total Protein: 7 g/dL (ref 6.0–8.3)

## 2016-12-19 LAB — LIPID PANEL
CHOL/HDL RATIO: 3
CHOLESTEROL: 100 mg/dL (ref 0–200)
HDL: 32.2 mg/dL — ABNORMAL LOW (ref >39.00)
LDL Cholesterol: 29 mg/dL (ref 0–99)
NonHDL: 67.84
TRIGLYCERIDES: 193 mg/dL — AB (ref 0.0–149.0)
VLDL: 38.6 mg/dL (ref 0.0–40.0)

## 2016-12-22 ENCOUNTER — Ambulatory Visit (INDEPENDENT_AMBULATORY_CARE_PROVIDER_SITE_OTHER): Payer: Medicare HMO | Admitting: Podiatry

## 2016-12-22 ENCOUNTER — Encounter: Payer: Self-pay | Admitting: Podiatry

## 2016-12-22 VITALS — BP 167/78 | HR 47

## 2016-12-22 DIAGNOSIS — L600 Ingrowing nail: Secondary | ICD-10-CM | POA: Diagnosis not present

## 2016-12-22 DIAGNOSIS — M79606 Pain in leg, unspecified: Secondary | ICD-10-CM

## 2016-12-22 DIAGNOSIS — B351 Tinea unguium: Secondary | ICD-10-CM | POA: Diagnosis not present

## 2016-12-22 NOTE — Patient Instructions (Signed)
Seen for painful hypertrophic nails. All nails debrided. Return in 3-4 months or as needed.

## 2016-12-22 NOTE — Progress Notes (Signed)
SUBJECTIVE: 56 y.o. year old female presents accompanied by her husband with painful thick nails and ingrown nail on both great toes.  Her last visit to the office was July 2017 for ingrown nails.  REVIEW OF SYSTEMS:  Psoriasis since age 40, Heart attack 14 years ago, stroke 6 years ago, Blinded right eye 2015 May.   OBJECTIVE: DERMATOLOGIC EXAMINATION: Massive long and discolored and deformed nails x 10. Symptomatic inflamed ingrown nail on left great toe medial border.  VASCULAR EXAMINATION OF LOWER LIMBS: Pedal pulses: All pedal pulses are palpable with normal pulsation.  Temperature gradient from tibial crest to dorsum of foot is within normal bilateral.  NEUROLOGIC EXAMINATION OF THE LOWER LIMBS: All epicritic and tactile sensations are grossly intact.   MUSCULOSKELETAL EXAMINATION: No gross deformities noted.  ASSESSMENT: Onychomycosis x 10. Onychocryptosis both great toe with pain. Pain in lower limbs.  PLAN: Reviewed clinical findings and available treatment options. All nails debrided. Return in 3-4 month.

## 2017-01-03 ENCOUNTER — Telehealth: Payer: Self-pay | Admitting: Family Medicine

## 2017-01-03 MED ORDER — LOSARTAN POTASSIUM 100 MG PO TABS: 100.0000 mg | ORAL_TABLET | Freq: Every day | ORAL | 3 refills | Status: DC

## 2017-01-03 NOTE — Telephone Encounter (Signed)
D/c diovan Losartan 100 mg #30  1 po qd --- nurse visit in 2 weeks to see if bp ok

## 2017-01-03 NOTE — Telephone Encounter (Signed)
FAX from pharmacy --- Drug recall [place] [address] [place]

## 2017-01-03 NOTE — Telephone Encounter (Signed)
Updated medication list Sent in new medication  Patient notified of information/scheduled Nurse visit appointment

## 2017-01-19 ENCOUNTER — Ambulatory Visit (INDEPENDENT_AMBULATORY_CARE_PROVIDER_SITE_OTHER): Payer: Medicare HMO | Admitting: Family Medicine

## 2017-01-19 DIAGNOSIS — I10 Essential (primary) hypertension: Secondary | ICD-10-CM

## 2017-01-19 NOTE — Progress Notes (Signed)
Noted. Agree with above.  

## 2017-01-19 NOTE — Progress Notes (Signed)
Pre visit review using our clinic tool,if applicable. No additional management support is needed unless otherwise documented below in the visit note.   Patient in for BP check per order from Dr. Carollee Herter dated 01/03/17.  Patient started Losartan 100 MG.  BP=115/79 P= 66  Per Dr. Deloris Ping DOD patient to continue medications as ordered. Return for scheduled follow up visit with Dr. Etter Sjogren- Cheri Rous. Patient notified/agreed.

## 2017-02-21 DIAGNOSIS — H02832 Dermatochalasis of right lower eyelid: Secondary | ICD-10-CM | POA: Diagnosis not present

## 2017-02-21 DIAGNOSIS — H5440 Blindness, one eye, unspecified eye: Secondary | ICD-10-CM | POA: Diagnosis not present

## 2017-02-21 DIAGNOSIS — G9389 Other specified disorders of brain: Secondary | ICD-10-CM | POA: Diagnosis not present

## 2017-02-21 DIAGNOSIS — H02834 Dermatochalasis of left upper eyelid: Secondary | ICD-10-CM | POA: Diagnosis not present

## 2017-02-21 DIAGNOSIS — H02401 Unspecified ptosis of right eyelid: Secondary | ICD-10-CM | POA: Diagnosis not present

## 2017-02-21 DIAGNOSIS — H02831 Dermatochalasis of right upper eyelid: Secondary | ICD-10-CM | POA: Diagnosis not present

## 2017-02-21 DIAGNOSIS — H02835 Dermatochalasis of left lower eyelid: Secondary | ICD-10-CM | POA: Diagnosis not present

## 2017-02-21 DIAGNOSIS — H35039 Hypertensive retinopathy, unspecified eye: Secondary | ICD-10-CM | POA: Diagnosis not present

## 2017-02-21 DIAGNOSIS — H5347 Heteronymous bilateral field defects: Secondary | ICD-10-CM | POA: Diagnosis not present

## 2017-02-23 ENCOUNTER — Telehealth: Payer: Self-pay | Admitting: Family Medicine

## 2017-02-23 DIAGNOSIS — F419 Anxiety disorder, unspecified: Secondary | ICD-10-CM

## 2017-02-23 MED ORDER — LORAZEPAM 1 MG PO TABS
ORAL_TABLET | ORAL | 1 refills | Status: DC
Start: 2017-02-23 — End: 2018-01-04

## 2017-02-23 NOTE — Progress Notes (Signed)
Subjective:   Sandra [name] is a 56 y.o. female who presents for Medicare Annual (Subsequent) preventive examination. Husband is here with her.  Review of Systems:  No ROS.  Medicare Wellness Visit. Additional risk factors are reflected in the social history. Cardiac Risk Factors include: advanced age (>93men, >78 women);dyslipidemia;sedentary lifestyle;smoking/ tobacco exposure;hypertension Sleep patterns: Takes lorazepam to help her sleep. Reports she has always struggled with sleep. Home Safety/Smoke Alarms: Feels safe in home. Smoke alarms in place.  Living environment; residence and Firearm Safety: Lives with husband in 1 story home and 2 dogs. Seat Belt Safety/Bike Helmet: Wears seat belt.   Female:    Mammo- 09/20/16:BI-RADS CATEGORY  1: Negative.          CCS- 06/25/12: recall 5 years    Objective:     Vitals: BP 137/74   Pulse (!) 58   Ht 5\' 5"  (1.651 m)   Wt 178 lb 3.2 oz (80.8 kg)   SpO2 98%   BMI 29.65 kg/m   Body mass index is 29.65 kg/m.   Tobacco History  Smoking Status  . Current Every Day Smoker  . Packs/day: 2.00  . Years: 42.00  . Types: Cigarettes  Smokeless Tobacco  . Never Used    Comment: Trying to quit again.     Ready to quit: No Counseling given: No   Past Medical History:  Diagnosis Date  . Acute right MCA stroke ([place]) 01/27/11   Acute R MCA stroked  with L arm and leg weakness  . Anxiety 1980s   On Klonipin since age 76. Had addiction problem with Xanax which  Was therefore d/c . Seen in the past at [facility].   . Arthritis 04/16/2011  . Blind right eye   . CAD (coronary artery disease) 2007   istory of MI with stent in 2007 by Dr. Chancy Milroy, [facility]. Stent placement by Dr Cleatis Polka   . Depression 03/07/2011  . Fracture of fifth toe, left, closed 01/28/2011   History of left fifth toe proximal phalanx fracture when patient had a stroke (01/2011) and fell. Seen by Dr Doran Durand.    . Fractured toe 01/2011   History of left fifth  toe proximal phalanx fracture when patient had a stroke (01/2011) and fell. Seen by Dr Doran Durand.   . Hyperlipidemia 03/07/2011  . Hypertension 03/07/2011  . Incontinence 04/14/2011  . Myocardial infarct, old 2008   stents  . PFO (patent foramen ovale) August 2012   PFO seen on TEE during hospitalization in 01/2011.  Patient to f/u with Dr. Leonie Man, neurology, for enrollment in trial for medical treatment of PFO  . Pneumonia   . Prediabetes 03/07/2011  . Psoriasis 03/07/2011  . Substance abuse    h/o narcotic abuse pt denies as of 01/09/12  . Tobacco abuse 03/07/2011   Quit 01/2011    Past Surgical History:  Procedure Laterality Date  . cardiac stent     2008  . EYE SURGERY    . IR GENERIC HISTORICAL  03/16/2016   IR RADIOLOGIST EVAL & MGMT 03/16/2016 Marybelle Killings, MD [facility] INTERV RAD   Family History  Problem Relation Age of Onset  . Heart disease Mother   . Diabetes Mother   . Heart disease Father   . Diabetes Father   . Cancer Sister 56       ovarian  . Heart disease Brother   . Diabetes Brother   . Heart disease Maternal Grandmother   . Heart disease Maternal Grandfather   .  Heart disease Paternal Grandmother   . Heart disease Paternal Grandfather   . Lung cancer Brother   . Heart disease Brother    History  Sexual Activity  . Sexual activity: Yes  . Partners: Male  . Birth control/ protection: None    Outpatient Encounter Prescriptions as of 02/27/2017  Medication Sig  . allopurinol (ZYLOPRIM) 300 MG tablet Take 1 tablet (300 mg total) by mouth daily.  Marland Kitchen amLODipine (NORVASC) 5 MG tablet Take 2 tablets (10 mg total) by mouth daily.  Marland Kitchen atropine 1 % ophthalmic solution   . clopidogrel (PLAVIX) 75 MG tablet Take 1 tablet (75 mg total) by mouth daily.  Marland Kitchen LORazepam (ATIVAN) 1 MG tablet TAKE 1 TABLET AT BEDTIME AS NEEDED FOR ANXIETY  OR FOR SLEEP  . losartan (COZAAR) 100 MG tablet Take 1 tablet (100 mg total) by mouth daily.  . metoprolol (LOPRESSOR) 100 MG tablet Take 1 tablet  (100 mg total) by mouth 2 (two) times daily.  . potassium chloride (MICRO-K) 10 MEQ CR capsule Take 2 capsules (20 mEq total) by mouth daily.  . prednisoLONE acetate (PRED FORTE) 1 % ophthalmic suspension 1 drop 4 (four) times daily.  Marland Kitchen spironolactone (ALDACTONE) 25 MG tablet Take 1 tablet (25 mg total) by mouth daily.  Marland Kitchen tiZANidine (ZANAFLEX) 4 MG tablet TAKE 2 TABLETS BY MOUTH TWICE DAILY  . cloNIDine (CATAPRES) 0.2 MG tablet Take 1 tablet (0.2 mg total) by mouth 2 (two) times daily. (Patient not taking: Reported on 02/27/2017)  . eszopiclone (LUNESTA) 2 MG TABS tablet Take 1 tablet (2 mg total) by mouth at bedtime as needed. for sleep (Patient not taking: Reported on 02/27/2017)  . promethazine (PHENERGAN) 25 MG tablet Take 1 tablet (25 mg total) by mouth every 8 (eight) hours as needed for nausea or vomiting. (Patient not taking: Reported on 02/27/2017)  . rosuvastatin (CRESTOR) 40 MG tablet Take 1 tablet (40 mg total) by mouth daily.  . [DISCONTINUED] LORazepam (ATIVAN) 1 MG tablet TAKE 1 TABLET AT BEDTIME AS NEEDED FOR ANXIETY  OR FOR SLEEP   No facility-administered encounter medications on file as of 02/27/2017.     Activities of Daily Living In your present state of health, do you have any difficulty performing the following activities: 02/27/2017  Hearing? N  Vision? N  Comment wearing glasses. blind right eye. [facility] every 6 months.  Difficulty concentrating or making decisions? N  Walking or climbing stairs? Y  Comment difficulty r/t left side leg weakness as stroke residual  Dressing or bathing? Y  Doing errands, shopping? Y  Comment can't due to blind right eye  Preparing Food and eating ? Y  Using the Toilet? Y  In the past six months, have you accidently leaked urine? Y  Do you have problems with loss of bowel control? Y  Managing your Medications? N  Managing your Finances? Y  Housekeeping or managing your Housekeeping? Y  Some recent data might be hidden     Patient Care Team: Carollee Herter, Alferd Apa, DO as PCP - General (Family Medicine) Lorretta Harp, MD as Consulting Physician (Cardiology)    Assessment:    Physical assessment deferred to PCP.  Exercise Activities and Dietary recommendations Current Exercise Habits: The patient does not participate in regular exercise at present, Exercise limited by: None identified   Diet (meal preparation, eat out, water intake, caffeinated beverages, dairy products, fruits and vegetables):  24 hour recall:  Breakfast: sausage biscuit. coffee Lunch: skipped Dinner: pork  chops and rice      Goals    . Blood Pressure < 130/80    . LDL CALC < 70    . Quit smoking / using tobacco      Fall Risk Fall Risk  02/27/2017 06/24/2016 02/22/2015 08/22/2014 04/08/2014  Falls in the past year? No No No No No  Number falls in past yr: - - - - -  Injury with Fall? - - - - -  Comment - - - - -  Risk Factor Category  - - - - -  Risk for fall due to : - - - - History of fall(s);Impaired balance/gait  Risk for fall due to: Comment - - - - -   Depression Screen PHQ 2/9 Scores 02/27/2017 06/24/2016 02/25/2016 02/22/2015  PHQ - 2 Score 1 0 0 0  PHQ- 9 Score - - - -     Cognitive Function MMSE - Mini Mental State Exam 02/27/2017 02/25/2016  Orientation to time 5 5  Orientation to Place 5 5  Registration 3 3  Attention/ Calculation 5 5  Recall 3 3  Language- name 2 objects 2 2  Language- repeat 1 1  Language- follow 3 step command 3 3  Language- read & follow direction 1 1  Write a sentence 1 1  Copy design 0 1  Total score 29 30        Immunization History  Administered Date(s) Administered  . Influenza Split 04/21/2011, 02/29/2012  . Influenza,inj,Quad PF,6+ Mos 03/01/2013, 03/27/2015, 02/25/2016  . Pneumococcal Conjugate-13 03/14/2016  . Pneumococcal Polysaccharide-23 01/29/2011  . Tdap 06/29/2011  . Zoster Recombinat (Shingrix) 08/26/2016, 01/18/2017   Screening Tests Health Maintenance  Topic  Date Due  . PNEUMOCOCCAL POLYSACCHARIDE VACCINE (2) 01/29/2016  . INFLUENZA VACCINE  01/18/2017  . PAP SMEAR  02/16/2017  . COLONOSCOPY  06/25/2017  . LIPID PANEL  12/19/2017  . MAMMOGRAM  09/02/2018  . TETANUS/TDAP  06/28/2021  . Hepatitis C Screening  Completed  . HIV Screening  Completed      Plan:  Follow up with PCP today as scheduled.  Continue to eat heart healthy diet (full of fruits, vegetables, whole grains, lean protein, water--limit salt, fat, and sugar intake) and increase physical activity as tolerated.  Continue doing brain stimulating activities (puzzles, reading, adult coloring books, staying active) to keep memory sharp.    I have personally reviewed and noted the following in the patient's chart:   . Medical and social history . Use of alcohol, tobacco or illicit drugs  . Current medications and supplements . Functional ability and status . Nutritional status . Physical activity . Advanced directives . List of other physicians . Hospitalizations, surgeries, and ER visits in previous 12 months . Vitals . Screenings to include cognitive, depression, and falls . Referrals and appointments  In addition, I have reviewed and discussed with patient certain preventive protocols, quality metrics, and best practice recommendations. A written personalized care plan for preventive services as well as general preventive health recommendations were provided to patient.     Shela Nevin, [place]  02/27/2017

## 2017-02-23 NOTE — Telephone Encounter (Signed)
Please advise on refill request.   Lorazepam was last filled on 08/26/16 Qty:90 Rf:1

## 2017-02-23 NOTE — Telephone Encounter (Signed)
Lorazepam needs filled at [name]. They say doctor did not sign script so the pharm wont fill it. Pt has no more lorazepam. Can we send a script for lorazepam to [facility] on [address] [place]. Call pt [phone].

## 2017-02-23 NOTE — Telephone Encounter (Signed)
Pt is aware that we will contact her once signed and ready for pick up. rx printed waiting sig

## 2017-02-23 NOTE — Telephone Encounter (Signed)
Ok to [employer]

## 2017-02-24 NOTE — Telephone Encounter (Signed)
Pt was made aware that rx has been placed up front for pick up and she had no additional questions at this time. Nothing further is needed

## 2017-02-27 ENCOUNTER — Ambulatory Visit (INDEPENDENT_AMBULATORY_CARE_PROVIDER_SITE_OTHER): Payer: Medicare HMO | Admitting: Family Medicine

## 2017-02-27 ENCOUNTER — Encounter: Payer: Self-pay | Admitting: Family Medicine

## 2017-02-27 VITALS — BP 137/74 | HR 58 | Temp 98.0°F | Ht 65.0 in | Wt 178.2 lb

## 2017-02-27 DIAGNOSIS — I10 Essential (primary) hypertension: Secondary | ICD-10-CM | POA: Diagnosis not present

## 2017-02-27 DIAGNOSIS — E7800 Pure hypercholesterolemia, unspecified: Secondary | ICD-10-CM

## 2017-02-27 DIAGNOSIS — M10079 Idiopathic gout, unspecified ankle and foot: Secondary | ICD-10-CM

## 2017-02-27 DIAGNOSIS — R197 Diarrhea, unspecified: Secondary | ICD-10-CM

## 2017-02-27 DIAGNOSIS — E785 Hyperlipidemia, unspecified: Secondary | ICD-10-CM

## 2017-02-27 DIAGNOSIS — Z23 Encounter for immunization: Secondary | ICD-10-CM

## 2017-02-27 DIAGNOSIS — I2510 Atherosclerotic heart disease of native coronary artery without angina pectoris: Secondary | ICD-10-CM

## 2017-02-27 DIAGNOSIS — Z0000 Encounter for general adult medical examination without abnormal findings: Secondary | ICD-10-CM | POA: Diagnosis not present

## 2017-02-27 LAB — COMPREHENSIVE METABOLIC PANEL
ALT: 10 U/L (ref 0–35)
AST: 12 U/L (ref 0–37)
Albumin: 4 g/dL (ref 3.5–5.2)
Alkaline Phosphatase: 80 U/L (ref 39–117)
BILIRUBIN TOTAL: 0.3 mg/dL (ref 0.2–1.2)
BUN: 11 mg/dL (ref 6–23)
CALCIUM: 8.9 mg/dL (ref 8.4–10.5)
CO2: 26 meq/L (ref 19–32)
CREATININE: 0.62 mg/dL (ref 0.40–1.20)
Chloride: 110 mEq/L (ref 96–112)
GFR: 105.99 mL/min (ref >60.00)
GLUCOSE: 116 mg/dL — AB (ref 70–99)
Potassium: 3.7 mEq/L (ref 3.5–5.1)
Sodium: 144 mEq/L (ref 135–145)
Total Protein: 6.4 g/dL (ref 6.0–8.3)

## 2017-02-27 LAB — CBC WITH DIFFERENTIAL/PLATELET
BASOS PCT: 0.3 % (ref 0.0–3.0)
Basophils Absolute: 0 10*3/uL (ref 0.0–0.1)
EOS ABS: 0.1 10*3/uL (ref 0.0–0.7)
Eosinophils Relative: 1.4 % (ref 0.0–5.0)
HCT: 41.4 % (ref 36.0–46.0)
Hemoglobin: 13.4 g/dL (ref 12.0–15.0)
LYMPHS ABS: 2.3 10*3/uL (ref 0.7–4.0)
Lymphocytes Relative: 25.8 % (ref 12.0–46.0)
MCHC: 32.4 g/dL (ref 30.0–36.0)
MCV: 87.2 fl (ref 78.0–100.0)
MONO ABS: 0.5 10*3/uL (ref 0.1–1.0)
Monocytes Relative: 6 % (ref 3.0–12.0)
NEUTROS ABS: 5.8 10*3/uL (ref 1.4–7.7)
Neutrophils Relative %: 66.5 % (ref 43.0–77.0)
PLATELETS: 231 10*3/uL (ref 150.0–400.0)
RBC: 4.75 Mil/uL (ref 3.87–5.11)
RDW: 15.1 % (ref 11.5–15.5)
WBC: 8.8 10*3/uL (ref 4.0–10.5)

## 2017-02-27 LAB — LIPID PANEL
CHOL/HDL RATIO: 3
CHOLESTEROL: 93 mg/dL (ref 0–200)
HDL: 27.5 mg/dL — AB (ref >39.00)
NONHDL: 65.32
TRIGLYCERIDES: 227 mg/dL — AB (ref 0.0–149.0)
VLDL: 45.4 mg/dL — AB (ref 0.0–40.0)

## 2017-02-27 LAB — LDL CHOLESTEROL, DIRECT: LDL DIRECT: 41 mg/dL

## 2017-02-27 LAB — URIC ACID: Uric Acid, Serum: 2.7 mg/dL (ref 2.4–7.0)

## 2017-02-27 MED ORDER — DICYCLOMINE HCL 10 MG PO CAPS: 10.0000 mg | ORAL_CAPSULE | Freq: Three times a day (TID) | ORAL | 1 refills | Status: DC

## 2017-02-27 NOTE — Assessment & Plan Note (Signed)
Well controlled, no changes to meds. Encouraged heart healthy diet such as the DASH diet and exercise as tolerated.  °

## 2017-02-27 NOTE — Progress Notes (Signed)
[name], female    DOB: Oct 05, 1960  Age: 56 y.o. MRN: [id number]    Subjective:  Subjective  HPI Sandra [name] presents for f/u.  She has lost 22 lbs in 5 months.  Pt c/o diarrhea x 1 week--  She states everything she eats is going through her .  She has been eating healthier and is losing weight.  No exercise.  No abd pain  Review of Systems  Constitutional: Negative for chills and fever.  HENT: Negative for congestion and hearing loss.   Eyes: Negative for discharge.  Respiratory: Negative for cough and shortness of breath.   Cardiovascular: Negative for chest pain, palpitations and leg swelling.  Gastrointestinal: Positive for diarrhea. Negative for abdominal pain, blood in stool, constipation, nausea and vomiting.  Genitourinary: Negative for dysuria, frequency, hematuria and urgency.  Musculoskeletal: Negative for back pain and myalgias.  Skin: Negative for rash.  Allergic/Immunologic: Negative for environmental allergies.  Neurological: Negative for dizziness, weakness and headaches.  Hematological: Does not bruise/bleed easily.  Psychiatric/Behavioral: Negative for suicidal ideas. The patient is not nervous/anxious.     History Past Medical History:  Diagnosis Date  . Acute right MCA stroke ([place]) 01/27/11   Acute R MCA stroked  with L arm and leg weakness  . Anxiety 1980s   On Klonipin since age 5. Had addiction problem with Xanax which  Was therefore d/c . Seen in the past at [facility].   . Arthritis 04/16/2011  . Blind right eye   . CAD (coronary artery disease) 2007   istory of MI with stent in 2007 by Dr. Chancy Milroy, [facility]. Stent placement by Dr Cleatis Polka   . Depression 03/07/2011  . Fracture of fifth toe, left, closed 01/28/2011   History of left fifth toe proximal phalanx fracture when patient had a stroke (01/2011) and fell. Seen by Dr Doran Durand.    . Fractured toe 01/2011   History of left fifth toe proximal phalanx fracture when patient had a  stroke (01/2011) and fell. Seen by Dr Doran Durand.   . Hyperlipidemia 03/07/2011  . Hypertension 03/07/2011  . Incontinence 04/14/2011  . Myocardial infarct, old 2008   stents  . PFO (patent foramen ovale) August 2012   PFO seen on TEE during hospitalization in 01/2011.  Patient to f/u with Dr. Leonie Man, neurology, for enrollment in trial for medical treatment of PFO  . Pneumonia   . Prediabetes 03/07/2011  . Psoriasis 03/07/2011  . Substance abuse    h/o narcotic abuse pt denies as of 01/09/12  . Tobacco abuse 03/07/2011   Quit 01/2011     She has a past surgical history that includes cardiac stent; Eye surgery; and ir generic historical (03/16/2016).   Her family history includes Cancer (age of onset: 71) in her sister; Diabetes in her brother, father, and mother; Heart disease in her brother, brother, father, maternal grandfather, maternal grandmother, mother, paternal grandfather, and paternal grandmother; Lung cancer in her brother.She reports that she has been smoking Cigarettes.  She has a 84.00 pack-year smoking history. She has never used smokeless tobacco. She reports that she does not drink alcohol or use drugs.  Current Outpatient Prescriptions on File Prior to Visit  Medication Sig Dispense Refill  . allopurinol (ZYLOPRIM) 300 MG tablet Take 1 tablet (300 mg total) by mouth daily. 90 tablet 1  . amLODipine (NORVASC) 5 MG tablet Take 2 tablets (10 mg total) by mouth daily. 180 tablet 1  . atropine 1 % ophthalmic solution     .  clopidogrel (PLAVIX) 75 MG tablet Take 1 tablet (75 mg total) by mouth daily. 90 tablet 3  . LORazepam (ATIVAN) 1 MG tablet TAKE 1 TABLET AT BEDTIME AS NEEDED FOR ANXIETY  OR FOR SLEEP 90 tablet 1  . losartan (COZAAR) 100 MG tablet Take 1 tablet (100 mg total) by mouth daily. 30 tablet 3  . metoprolol (LOPRESSOR) 100 MG tablet Take 1 tablet (100 mg total) by mouth 2 (two) times daily. 180 tablet 1  . potassium chloride (MICRO-K) 10 MEQ CR capsule Take 2 capsules (20 mEq  total) by mouth daily. 60 capsule 2  . prednisoLONE acetate (PRED FORTE) 1 % ophthalmic suspension 1 drop 4 (four) times daily.    Marland Kitchen spironolactone (ALDACTONE) 25 MG tablet Take 1 tablet (25 mg total) by mouth daily. 30 tablet 5  . tiZANidine (ZANAFLEX) 4 MG tablet TAKE 2 TABLETS BY MOUTH TWICE DAILY 270 tablet 1  . cloNIDine (CATAPRES) 0.2 MG tablet Take 1 tablet (0.2 mg total) by mouth 2 (two) times daily. (Patient not taking: Reported on 02/27/2017) 180 tablet 3  . eszopiclone (LUNESTA) 2 MG TABS tablet Take 1 tablet (2 mg total) by mouth at bedtime as needed. for sleep (Patient not taking: Reported on 02/27/2017) 30 tablet 0  . promethazine (PHENERGAN) 25 MG tablet Take 1 tablet (25 mg total) by mouth every 8 (eight) hours as needed for nausea or vomiting. (Patient not taking: Reported on 02/27/2017) 15 tablet 0  . rosuvastatin (CRESTOR) 40 MG tablet Take 1 tablet (40 mg total) by mouth daily. 90 tablet 3   No current facility-administered medications on file prior to visit.      Objective:  Objective  Physical Exam  Constitutional: She is oriented to person, place, and time. She appears well-developed and well-nourished.  HENT:  Head: Normocephalic and atraumatic.  Eyes: Conjunctivae and EOM are normal.  Neck: Normal range of motion. Neck supple. No JVD present. Carotid bruit is not present. No thyromegaly present.  Cardiovascular: Normal rate, regular rhythm and normal heart sounds.   No murmur heard. Pulmonary/Chest: Effort normal and breath sounds normal. No respiratory distress. She has no wheezes. She has no rales. She exhibits no tenderness.  Musculoskeletal: She exhibits no edema.  Neurological: She is alert and oriented to person, place, and time.  Psychiatric: She has a normal mood and affect.  Nursing note and vitals reviewed.  BP 137/74   Pulse (!) 58   Temp 98 F (36.7 C) (Oral)   Ht 5' 5"  (1.651 m)   Wt 178 lb 3.2 oz (80.8 kg)   SpO2 98%   BMI 29.65 kg/m  Wt  Readings from Last 3 Encounters:  02/27/17 178 lb 3.2 oz (80.8 kg)  10/02/16 200 lb (90.7 kg)  09/09/16 187 lb (84.8 kg)     Lab Results  Component Value Date   WBC 12.1 (H) 10/02/2016   HGB 13.9 10/02/2016   HCT 41.1 10/02/2016   PLT 252 10/02/2016   GLUCOSE 116 (H) 12/19/2016   CHOL 100 12/19/2016   TRIG 193.0 (H) 12/19/2016   HDL 32.20 (L) 12/19/2016   LDLDIRECT 97.0 02/25/2016   LDLCALC 29 12/19/2016   ALT 11 12/19/2016   AST 12 12/19/2016   NA 142 12/19/2016   K 4.5 12/19/2016   CL 108 12/19/2016   CREATININE 0.60 12/19/2016   BUN 15 12/19/2016   CO2 26 12/19/2016   TSH 0.46 05/29/2015   INR 1.06 06/29/2016   HGBA1C 6.2 06/09/2016   MICROALBUR  1.6 03/23/2016    No results found.   Assessment & Plan:  Plan  I am having Sandra [name] start on dicyclomine. I am also having her maintain her prednisoLONE acetate, atropine, eszopiclone, tiZANidine, promethazine, spironolactone, potassium chloride, metoprolol tartrate, amLODipine, allopurinol, rosuvastatin, cloNIDine, clopidogrel, losartan, and LORazepam.  Meds ordered this encounter  Medications  . dicyclomine (BENTYL) 10 MG capsule    Sig: Take 1 capsule (10 mg total) by mouth 4 (four) times daily -  before meals and at bedtime.    Dispense:  30 capsule    Refill:  1    Problem List Items Addressed This Visit      Unprioritized   Gout   Relevant Orders   Uric acid   Diarrhea   Relevant Medications   dicyclomine (BENTYL) 10 MG capsule   Other Relevant Orders   Fecal occult blood, imunochemical   CBC with Differential/Platelet   CAD (coronary artery disease)    Per cardiology      Essential hypertension (Chronic)    Well controlled, no changes to meds. Encouraged heart healthy diet such as the DASH diet and exercise as tolerated.       Relevant Orders   Lipid panel   Comprehensive metabolic panel   Hyperlipidemia    Tolerating statin, encouraged heart healthy diet, avoid trans fats, minimize simple  carbs and saturated fats. Increase exercise as tolerated       Other Visit Diagnoses    Encounter for Medicare annual wellness exam    -  Primary   Need for prophylactic vaccination and inoculation against influenza       Relevant Orders   Flu Vaccine QUAD 36+ mos IM (Fluarix & Fluzone Quad PF (Completed)   Hyperlipidemia LDL goal <100       Relevant Orders   Lipid panel   Comprehensive metabolic panel      Follow-up: Return in about 6 months (around 08/27/2017).  Ann Held, DO

## 2017-02-27 NOTE — Assessment & Plan Note (Signed)
Per cardiology 

## 2017-02-27 NOTE — Assessment & Plan Note (Signed)
Tolerating statin, encouraged heart healthy diet, avoid trans fats, minimize simple carbs and saturated fats. Increase exercise as tolerated 

## 2017-02-27 NOTE — Patient Instructions (Addendum)
Sandra [name] , Thank you for taking time to come for your Medicare Wellness Visit. I appreciate your ongoing commitment to your health goals. Please review the following plan we discussed and let me know if I can assist you in the future.   These are the goals we discussed: Goals    . Blood Pressure < 130/80    . LDL CALC < 70    . Quit smoking / using tobacco       This is a list of the screening recommended for you and due dates:  Health Maintenance  Topic Date Due  . Pneumococcal vaccine (2) 01/29/2016  . Flu Shot  01/18/2017  . Pap Smear  02/16/2017  . Colon Cancer Screening  06/25/2017  . Lipid (cholesterol) test  12/19/2017  . Mammogram  09/02/2018  . Tetanus Vaccine  06/28/2021  .  Hepatitis C: One time screening is recommended by Center for Disease Control  (CDC) for  adults born from [birth year] through 1964.   Completed  . HIV Screening  Completed   Continue to eat heart healthy diet (full of fruits, vegetables, whole grains, lean protein, water--limit salt, fat, and sugar intake) and increase physical activity as tolerated.  Continue doing brain stimulating activities (puzzles, reading, adult coloring books, staying active) to keep memory sharp.    Health Maintenance for Postmenopausal Women Menopause is a normal process in which your reproductive ability comes to an end. This process happens gradually over a span of months to years, usually between the ages of 15 and 67. Menopause is complete when you have missed 12 consecutive menstrual periods. It is important to talk with your health care provider about some of the most common conditions that affect postmenopausal women, such as heart disease, cancer, and bone loss (osteoporosis). Adopting a healthy lifestyle and getting preventive care can help to promote your health and wellness. Those actions can also lower your chances of developing some of these common conditions. What should I know about menopause? During menopause, you  may experience a number of symptoms, such as:  Moderate-to-severe hot flashes.  Night sweats.  Decrease in sex drive.  Mood swings.  Headaches.  Tiredness.  Irritability.  Memory problems.  Insomnia.  Choosing to treat or not to treat menopausal changes is an individual decision that you make with your health care provider. What should I know about hormone replacement therapy and supplements? Hormone therapy products are effective for treating symptoms that are associated with menopause, such as hot flashes and night sweats. Hormone replacement carries certain risks, especially as you become older. If you are thinking about using estrogen or estrogen with progestin treatments, discuss the benefits and risks with your health care provider. What should I know about heart disease and stroke? Heart disease, heart attack, and stroke become more likely as you age. This may be due, in part, to the hormonal changes that your body experiences during menopause. These can affect how your body processes dietary fats, triglycerides, and cholesterol. Heart attack and stroke are both medical emergencies. There are many things that you can do to help prevent heart disease and stroke:  Have your blood pressure checked at least every 1-2 years. High blood pressure causes heart disease and increases the risk of stroke.  If you are 27-75 years old, ask your health care provider if you should take aspirin to prevent a heart attack or a stroke.  Do not use any tobacco products, including cigarettes, chewing tobacco, or electronic cigarettes. If  you need help quitting, ask your health care provider.  It is important to eat a healthy diet and maintain a healthy weight. ? Be sure to include plenty of vegetables, fruits, low-fat dairy products, and lean protein. ? Avoid eating foods that are high in solid fats, added sugars, or salt (sodium).  Get regular exercise. This is one of the most important things  that you can do for your health. ? Try to exercise for at least 150 minutes each week. The type of exercise that you do should increase your heart rate and make you sweat. This is known as moderate-intensity exercise. ? Try to do strengthening exercises at least twice each week. Do these in addition to the moderate-intensity exercise.  Know your numbers.Ask your health care provider to check your cholesterol and your blood glucose. Continue to have your blood tested as directed by your health care provider.  What should I know about cancer screening? There are several types of cancer. Take the following steps to reduce your risk and to catch any cancer development as early as possible. Breast Cancer  Practice breast self-awareness. ? This means understanding how your breasts normally appear and feel. ? It also means doing regular breast self-exams. Let your health care provider know about any changes, no matter how small.  If you are 24 or older, have a clinician do a breast exam (clinical breast exam or CBE) every year. Depending on your age, family history, and medical history, it may be recommended that you also have a yearly breast X-ray (mammogram).  If you have a family history of breast cancer, talk with your health care provider about genetic screening.  If you are at high risk for breast cancer, talk with your health care provider about having an MRI and a mammogram every year.  Breast cancer (BRCA) gene test is recommended for women who have family members with BRCA-related cancers. Results of the assessment will determine the need for genetic counseling and BRCA1 and for BRCA2 testing. BRCA-related cancers include these types: ? Breast. This occurs in males or females. ? Ovarian. ? Tubal. This may also be called fallopian tube cancer. ? Cancer of the abdominal or pelvic lining (peritoneal cancer). ? Prostate. ? Pancreatic.  Cervical, Uterine, and Ovarian Cancer Your health  care provider may recommend that you be screened regularly for cancer of the pelvic organs. These include your ovaries, uterus, and vagina. This screening involves a pelvic exam, which includes checking for microscopic changes to the surface of your cervix (Pap test).  For women ages 21-65, health care providers may recommend a pelvic exam and a Pap test every three years. For women ages 76-65, they may recommend the Pap test and pelvic exam, combined with testing for human papilloma virus (HPV), every five years. Some types of HPV increase your risk of cervical cancer. Testing for HPV may also be done on women of any age who have unclear Pap test results.  Other health care providers may not recommend any screening for nonpregnant women who are considered low risk for pelvic cancer and have no symptoms. Ask your health care provider if a screening pelvic exam is right for you.  If you have had past treatment for cervical cancer or a condition that could lead to cancer, you need Pap tests and screening for cancer for at least 20 years after your treatment. If Pap tests have been discontinued for you, your risk factors (such as having a new sexual partner) need to  be reassessed to determine if you should start having screenings again. Some women have medical problems that increase the chance of getting cervical cancer. In these cases, your health care provider may recommend that you have screening and Pap tests more often.  If you have a family history of uterine cancer or ovarian cancer, talk with your health care provider about genetic screening.  If you have vaginal bleeding after reaching menopause, tell your health care provider.  There are currently no reliable tests available to screen for ovarian cancer.  Lung Cancer Lung cancer screening is recommended for adults 49-68 years old who are at high risk for lung cancer because of a history of smoking. A yearly low-dose CT scan of the lungs is  recommended if you:  Currently smoke.  Have a history of at least 30 pack-years of smoking and you currently smoke or have quit within the past 15 years. A pack-year is smoking an average of one pack of cigarettes per day for one year.  Yearly screening should:  Continue until it has been 15 years since you quit.  Stop if you develop a health problem that would prevent you from having lung cancer treatment.  Colorectal Cancer  This type of cancer can be detected and can often be prevented.  Routine colorectal cancer screening usually begins at age 91 and continues through age 62.  If you have risk factors for colon cancer, your health care provider may recommend that you be screened at an earlier age.  If you have a family history of colorectal cancer, talk with your health care provider about genetic screening.  Your health care provider may also recommend using home test kits to check for hidden blood in your stool.  A small camera at the end of a tube can be used to examine your colon directly (sigmoidoscopy or colonoscopy). This is done to check for the earliest forms of colorectal cancer.  Direct examination of the colon should be repeated every 5-10 years until age 61. However, if early forms of precancerous polyps or small growths are found or if you have a family history or genetic risk for colorectal cancer, you may need to be screened more often.  Skin Cancer  Check your skin from head to toe regularly.  Monitor any moles. Be sure to tell your health care provider: ? About any new moles or changes in moles, especially if there is a change in a mole's shape or color. ? If you have a mole that is larger than the size of a pencil eraser.  If any of your family members has a history of skin cancer, especially at a young age, talk with your health care provider about genetic screening.  Always use sunscreen. Apply sunscreen liberally and repeatedly throughout the  day.  Whenever you are outside, protect yourself by wearing long sleeves, pants, a wide-brimmed hat, and sunglasses.  What should I know about osteoporosis? Osteoporosis is a condition in which bone destruction happens more quickly than new bone creation. After menopause, you may be at an increased risk for osteoporosis. To help prevent osteoporosis or the bone fractures that can happen because of osteoporosis, the following is recommended:  If you are 29-2 years old, get at least 1,000 mg of calcium and at least 600 mg of vitamin D per day.  If you are older than age 19 but younger than age 71, get at least 1,200 mg of calcium and at least 600 mg of vitamin D  per day.  If you are older than age 58, get at least 1,200 mg of calcium and at least 800 mg of vitamin D per day.  Smoking and excessive alcohol intake increase the risk of osteoporosis. Eat foods that are rich in calcium and vitamin D, and do weight-bearing exercises several times each week as directed by your health care provider. What should I know about how menopause affects my mental health? Depression may occur at any age, but it is more common as you become older. Common symptoms of depression include:  Low or sad mood.  Changes in sleep patterns.  Changes in appetite or eating patterns.  Feeling an overall lack of motivation or enjoyment of activities that you previously enjoyed.  Frequent crying spells.  Talk with your health care provider if you think that you are experiencing depression. What should I know about immunizations? It is important that you get and maintain your immunizations. These include:  Tetanus, diphtheria, and pertussis (Tdap) booster vaccine.  Influenza every year before the flu season begins.  Pneumonia vaccine.  Shingles vaccine.  Your health care provider may also recommend other immunizations. This information is not intended to replace advice given to you by your health care provider.  Make sure you discuss any questions you have with your health care provider. Document Released: 07/29/2005 Document Revised: 12/25/2015 Document Reviewed: 03/10/2015 Elsevier Interactive Patient Education  2018 [employer].

## 2017-03-01 ENCOUNTER — Telehealth: Payer: Self-pay

## 2017-03-01 DIAGNOSIS — E785 Hyperlipidemia, unspecified: Secondary | ICD-10-CM

## 2017-03-01 NOTE — Telephone Encounter (Signed)
Pt aware of lab results/created future order for Lipid & CMP/thx dmf

## 2017-03-01 NOTE — Telephone Encounter (Signed)
-----   Message from Ann Held, DO sent at 03/01/2017 11:15 AM EDT ----- Cholesterol--- LDL goal < 100,  HDL >40,  TG < 150.  Diet and exercise will increase HDL and decrease LDL and TG.  Fish,  Fish Oil, Flaxseed oil will also help increase the HDL and decrease Triglycerides.   Recheck labs in 3 months Lipid, cmp.

## 2017-03-07 ENCOUNTER — Other Ambulatory Visit (INDEPENDENT_AMBULATORY_CARE_PROVIDER_SITE_OTHER): Payer: Medicare HMO

## 2017-03-07 DIAGNOSIS — R197 Diarrhea, unspecified: Secondary | ICD-10-CM | POA: Diagnosis not present

## 2017-03-07 LAB — FECAL OCCULT BLOOD, IMMUNOCHEMICAL: Fecal Occult Bld: NEGATIVE

## 2017-03-07 NOTE — Addendum Note (Signed)
Addended by: Harl Bowie on: 03/07/2017 03:11 PM   Modules accepted: Orders

## 2017-03-13 ENCOUNTER — Telehealth: Payer: Self-pay | Admitting: Family Medicine

## 2017-03-13 DIAGNOSIS — R197 Diarrhea, unspecified: Secondary | ICD-10-CM

## 2017-03-13 NOTE — Telephone Encounter (Signed)
Pt request referral immediately to [facility] off [address] Ferdinand Lango gastroenterologist [phone]. They do more than pain mgmt they have all types of drs there. Please do referral to gastro Dr Ferdinand Lango. Pt request urgent referral done. Pt says will cancel appt tomorrow with [place] if we would do the referral today. They told pt they could see pt today or tomorrow if they have referral. Call pt as soon as it is sent. [phone].

## 2017-03-13 NOTE — Telephone Encounter (Signed)
Referral sent 

## 2017-03-13 NOTE — Telephone Encounter (Signed)
Ok to refer to gi for diarrhea

## 2017-03-14 ENCOUNTER — Encounter: Payer: Self-pay | Admitting: Family Medicine

## 2017-03-14 ENCOUNTER — Ambulatory Visit (INDEPENDENT_AMBULATORY_CARE_PROVIDER_SITE_OTHER): Payer: Medicare HMO | Admitting: Family Medicine

## 2017-03-14 VITALS — BP 124/82 | HR 61 | Resp 18 | Ht 65.0 in | Wt 181.0 lb

## 2017-03-14 DIAGNOSIS — Z8673 Personal history of transient ischemic attack (TIA), and cerebral infarction without residual deficits: Secondary | ICD-10-CM

## 2017-03-14 DIAGNOSIS — R197 Diarrhea, unspecified: Secondary | ICD-10-CM

## 2017-03-14 DIAGNOSIS — R1013 Epigastric pain: Secondary | ICD-10-CM

## 2017-03-14 LAB — CBC WITH DIFFERENTIAL/PLATELET
BASOS ABS: 0 10*3/uL (ref 0.0–0.1)
Basophils Relative: 0.3 % (ref 0.0–3.0)
EOS ABS: 0.2 10*3/uL (ref 0.0–0.7)
Eosinophils Relative: 3.1 % (ref 0.0–5.0)
HCT: 39.2 % (ref 36.0–46.0)
Hemoglobin: 12.7 g/dL (ref 12.0–15.0)
LYMPHS ABS: 2.4 10*3/uL (ref 0.7–4.0)
Lymphocytes Relative: 31.5 % (ref 12.0–46.0)
MCHC: 32.3 g/dL (ref 30.0–36.0)
MCV: 86.7 fl (ref 78.0–100.0)
MONOS PCT: 5.9 % (ref 3.0–12.0)
Monocytes Absolute: 0.5 10*3/uL (ref 0.1–1.0)
NEUTROS PCT: 59.2 % (ref 43.0–77.0)
Neutro Abs: 4.6 10*3/uL (ref 1.4–7.7)
PLATELETS: 206 10*3/uL (ref 150.0–400.0)
RBC: 4.52 Mil/uL (ref 3.87–5.11)
RDW: 14.8 % (ref 11.5–15.5)
WBC: 7.7 10*3/uL (ref 4.0–10.5)

## 2017-03-14 LAB — COMPREHENSIVE METABOLIC PANEL
ALK PHOS: 81 U/L (ref 39–117)
ALT: 11 U/L (ref 0–35)
AST: 12 U/L (ref 0–37)
Albumin: 3.8 g/dL (ref 3.5–5.2)
BILIRUBIN TOTAL: 0.3 mg/dL (ref 0.2–1.2)
BUN: 9 mg/dL (ref 6–23)
CO2: 28 meq/L (ref 19–32)
Calcium: 8.9 mg/dL (ref 8.4–10.5)
Chloride: 108 mEq/L (ref 96–112)
Creatinine, Ser: 0.6 mg/dL (ref 0.40–1.20)
GFR: 110.06 mL/min (ref >60.00)
GLUCOSE: 171 mg/dL — AB (ref 70–99)
Potassium: 3.7 mEq/L (ref 3.5–5.1)
SODIUM: 140 meq/L (ref 135–145)
TOTAL PROTEIN: 6.1 g/dL (ref 6.0–8.3)

## 2017-03-14 LAB — TSH: TSH: 0.87 u[IU]/mL (ref 0.35–4.50)

## 2017-03-14 MED ORDER — DICYCLOMINE HCL 20 MG PO TABS: 20.0000 mg | ORAL_TABLET | Freq: Three times a day (TID) | ORAL | 2 refills | Status: DC

## 2017-03-14 MED ORDER — OMEPRAZOLE 20 MG PO CPDR: 20.0000 mg | DELAYED_RELEASE_CAPSULE | Freq: Every day | ORAL | 3 refills | Status: DC

## 2017-03-14 MED ORDER — CLOPIDOGREL BISULFATE 75 MG PO TABS: 75.0000 mg | ORAL_TABLET | Freq: Every day | ORAL | 3 refills | Status: AC

## 2017-03-14 NOTE — Patient Instructions (Signed)
Diarrhea, Adult Diarrhea is frequent loose and watery bowel movements. Diarrhea can make you feel weak and cause you to become dehydrated. Dehydration can make you tired and thirsty, cause you to have a dry mouth, and decrease how often you urinate. Diarrhea typically lasts 2-3 days. However, it can last longer if it is a sign of something more serious. It is important to treat your diarrhea as told by your health care provider. Follow these instructions at home: Eating and drinking  Follow these recommendations as told by your health care provider:  Take an oral rehydration solution (ORS). This is a drink that is sold at pharmacies and retail stores.  Drink clear fluids, such as water, ice chips, diluted fruit juice, and low-calorie sports drinks.  Eat bland, easy-to-digest foods in small amounts as you are able. These foods include bananas, applesauce, rice, lean meats, toast, and crackers.  Avoid drinking fluids that contain a lot of sugar or caffeine, such as energy drinks, sports drinks, and soda.  Avoid alcohol.  Avoid spicy or fatty foods.  General instructions  Drink enough fluid to keep your urine clear or pale yellow.  Wash your hands often. If soap and water are not available, use hand sanitizer.  Make sure that all people in your household wash their hands well and often.  Take over-the-counter and prescription medicines only as told by your health care provider.  Rest at home while you recover.  Watch your condition for any changes.  Take a warm bath to relieve any burning or pain from frequent diarrhea episodes.  Keep all follow-up visits as told by your health care provider. This is important. Contact a health care provider if:  You have a fever.  Your diarrhea gets worse.  You have new symptoms.  You cannot keep fluids down.  You feel light-headed or dizzy.  You have a headache  You have muscle cramps. Get help right away if:  You have chest  pain.  You feel extremely weak or you faint.  You have bloody or black stools or stools that look like tar.  You have severe pain, cramping, or bloating in your abdomen.  You have trouble breathing or you are breathing very quickly.  Your heart is beating very quickly.  Your skin feels cold and clammy.  You feel confused.  You have signs of dehydration, such as: ? Dark urine, very little urine, or no urine. ? Cracked lips. ? Dry mouth. ? Sunken eyes. ? Sleepiness. ? Weakness. This information is not intended to replace advice given to you by your health care provider. Make sure you discuss any questions you have with your health care provider. Document Released: 05/27/2002 Document Revised: 10/15/2015 Document Reviewed: 02/10/2015 Elsevier Interactive Patient Education  2017 [employer].

## 2017-03-14 NOTE — Progress Notes (Signed)
[name], female    DOB: Jun 05, 1961  Age: 56 y.o. MRN: [id number]    Subjective:  Subjective  HPI Sandra [name] presents for diarrhea 5-6 x a day.  Watery diarrhea,  No blood.  No fevers.  Pt has not traveled anywhere and has not had any antibiotics.    Review of Systems  Constitutional: Negative for activity change, appetite change, fatigue and unexpected weight change.  Respiratory: Negative for cough and shortness of breath.   Cardiovascular: Negative for chest pain and palpitations.  Gastrointestinal: Positive for diarrhea. Negative for abdominal distention.  Psychiatric/Behavioral: Negative for behavioral problems and dysphoric mood. The patient is not nervous/anxious.     History Past Medical History:  Diagnosis Date  . Acute right MCA stroke ([place]) 01/27/11   Acute R MCA stroked  with L arm and leg weakness  . Anxiety 1980s   On Klonipin since age 33. Had addiction problem with Xanax which  Was therefore d/c . Seen in the past at [facility].   . Arthritis 04/16/2011  . Blind right eye   . CAD (coronary artery disease) 2007   istory of MI with stent in 2007 by Dr. Chancy Milroy, [facility]. Stent placement by Dr Cleatis Polka   . Depression 03/07/2011  . Fracture of fifth toe, left, closed 01/28/2011   History of left fifth toe proximal phalanx fracture when patient had a stroke (01/2011) and fell. Seen by Dr Doran Durand.    . Fractured toe 01/2011   History of left fifth toe proximal phalanx fracture when patient had a stroke (01/2011) and fell. Seen by Dr Doran Durand.   . Hyperlipidemia 03/07/2011  . Hypertension 03/07/2011  . Incontinence 04/14/2011  . Myocardial infarct, old 2008   stents  . PFO (patent foramen ovale) August 2012   PFO seen on TEE during hospitalization in 01/2011.  Patient to f/u with Dr. Leonie Man, neurology, for enrollment in trial for medical treatment of PFO  . Pneumonia   . Prediabetes 03/07/2011  . Psoriasis 03/07/2011  . Substance abuse    h/o narcotic  abuse pt denies as of 01/09/12  . Tobacco abuse 03/07/2011   Quit 01/2011     She has a past surgical history that includes cardiac stent; Eye surgery; and ir generic historical (03/16/2016).   Her family history includes Cancer (age of onset: 17) in her sister; Diabetes in her brother, father, and mother; Heart disease in her brother, brother, father, maternal grandfather, maternal grandmother, mother, paternal grandfather, and paternal grandmother; Lung cancer in her brother.She reports that she has been smoking Cigarettes.  She has a 84.00 pack-year smoking history. She has never used smokeless tobacco. She reports that she does not drink alcohol or use drugs.  Current Outpatient Prescriptions on File Prior to Visit  Medication Sig Dispense Refill  . allopurinol (ZYLOPRIM) 300 MG tablet Take 1 tablet (300 mg total) by mouth daily. 90 tablet 1  . amLODipine (NORVASC) 5 MG tablet Take 2 tablets (10 mg total) by mouth daily. 180 tablet 1  . atropine 1 % ophthalmic solution     . cloNIDine (CATAPRES) 0.2 MG tablet Take 1 tablet (0.2 mg total) by mouth 2 (two) times daily. 180 tablet 3  . clopidogrel (PLAVIX) 75 MG tablet Take 1 tablet (75 mg total) by mouth daily. 90 tablet 3  . eszopiclone (LUNESTA) 2 MG TABS tablet Take 1 tablet (2 mg total) by mouth at bedtime as needed. for sleep 30 tablet 0  . LORazepam (ATIVAN) 1  MG tablet TAKE 1 TABLET AT BEDTIME AS NEEDED FOR ANXIETY  OR FOR SLEEP 90 tablet 1  . losartan (COZAAR) 100 MG tablet Take 1 tablet (100 mg total) by mouth daily. 30 tablet 3  . metoprolol (LOPRESSOR) 100 MG tablet Take 1 tablet (100 mg total) by mouth 2 (two) times daily. 180 tablet 1  . potassium chloride (MICRO-K) 10 MEQ CR capsule Take 2 capsules (20 mEq total) by mouth daily. 60 capsule 2  . prednisoLONE acetate (PRED FORTE) 1 % ophthalmic suspension 1 drop 4 (four) times daily.    . promethazine (PHENERGAN) 25 MG tablet Take 1 tablet (25 mg total) by mouth every 8 (eight) hours  as needed for nausea or vomiting. 15 tablet 0  . spironolactone (ALDACTONE) 25 MG tablet Take 1 tablet (25 mg total) by mouth daily. 30 tablet 5  . tiZANidine (ZANAFLEX) 4 MG tablet TAKE 2 TABLETS BY MOUTH TWICE DAILY 270 tablet 1  . rosuvastatin (CRESTOR) 40 MG tablet Take 1 tablet (40 mg total) by mouth daily. 90 tablet 3   No current facility-administered medications on file prior to visit.      Objective:  Objective  Physical Exam  Constitutional: She is oriented to person, place, and time. She appears well-developed and well-nourished.  HENT:  Head: Normocephalic and atraumatic.  Eyes: Conjunctivae and EOM are normal.  Neck: Normal range of motion. Neck supple. No JVD present. Carotid bruit is not present. No thyromegaly present.  Cardiovascular: Normal rate, regular rhythm and normal heart sounds.   No murmur heard. Pulmonary/Chest: Effort normal and breath sounds normal. No respiratory distress. She has no wheezes. She has no rales. She exhibits no tenderness.  Musculoskeletal: She exhibits no edema.  Neurological: She is alert and oriented to person, place, and time.  Psychiatric: She has a normal mood and affect.  Nursing note and vitals reviewed.  BP 124/82   Pulse 61   Resp 18   Ht 5' 5"  (1.651 m)   Wt 181 lb (82.1 kg)   SpO2 97%   BMI 30.12 kg/m  Wt Readings from Last 3 Encounters:  03/14/17 181 lb (82.1 kg)  02/27/17 178 lb 3.2 oz (80.8 kg)  10/02/16 200 lb (90.7 kg)     Lab Results  Component Value Date   WBC 8.8 02/27/2017   HGB 13.4 02/27/2017   HCT 41.4 02/27/2017   PLT 231.0 02/27/2017   GLUCOSE 116 (H) 02/27/2017   CHOL 93 02/27/2017   TRIG 227.0 (H) 02/27/2017   HDL 27.50 (L) 02/27/2017   LDLDIRECT 41.0 02/27/2017   LDLCALC 29 12/19/2016   ALT 10 02/27/2017   AST 12 02/27/2017   NA 144 02/27/2017   K 3.7 02/27/2017   CL 110 02/27/2017   CREATININE 0.62 02/27/2017   BUN 11 02/27/2017   CO2 26 02/27/2017   TSH 0.46 05/29/2015   INR 1.06  06/29/2016   HGBA1C 6.2 06/09/2016   MICROALBUR 1.6 03/23/2016    No results found.   Assessment & Plan:  Plan  I have discontinued Ms. Verry's dicyclomine. I am also having her start on dicyclomine and omeprazole. Additionally, I am having her maintain her prednisoLONE acetate, atropine, eszopiclone, tiZANidine, promethazine, spironolactone, potassium chloride, metoprolol tartrate, amLODipine, allopurinol, rosuvastatin, cloNIDine, clopidogrel, losartan, and LORazepam.  Meds ordered this encounter  Medications  . dicyclomine (BENTYL) 20 MG tablet    Sig: Take 1 tablet (20 mg total) by mouth 4 (four) times daily -  before meals and at bedtime.  Dispense:  60 tablet    Refill:  2  . omeprazole (PRILOSEC) 20 MG capsule    Sig: Take 1 capsule (20 mg total) by mouth daily.    Dispense:  30 capsule    Refill:  3    Problem List Items Addressed This Visit      Unprioritized   Diarrhea - Primary   Relevant Medications   dicyclomine (BENTYL) 20 MG tablet   Other Relevant Orders   Clostridium difficile EIA   TSH   CBC with Differential/Platelet   Comprehensive metabolic panel   Stool culture    Other Visit Diagnoses    Midepigastric pain       Relevant Medications   omeprazole (PRILOSEC) 20 MG capsule      Follow-up: Return if symptoms worsen or fail to improve.  Ann Held, DO

## 2017-03-15 ENCOUNTER — Other Ambulatory Visit: Payer: Self-pay | Admitting: Family Medicine

## 2017-03-15 ENCOUNTER — Telehealth: Payer: Self-pay | Admitting: Family Medicine

## 2017-03-15 DIAGNOSIS — E785 Hyperlipidemia, unspecified: Secondary | ICD-10-CM

## 2017-03-15 DIAGNOSIS — E119 Type 2 diabetes mellitus without complications: Secondary | ICD-10-CM

## 2017-03-15 LAB — CLOSTRIDIUM DIFFICILE BY PCR: Toxigenic C. Difficile by PCR: NOT DETECTED

## 2017-03-15 NOTE — Telephone Encounter (Signed)
Relation to DG:NPHQ Call back number:[phone]   Reason for call:  Patient inquiring stool culture results

## 2017-03-17 ENCOUNTER — Telehealth: Payer: Self-pay | Admitting: Family Medicine

## 2017-03-17 ENCOUNTER — Telehealth: Payer: Self-pay | Admitting: *Deleted

## 2017-03-17 DIAGNOSIS — R1084 Generalized abdominal pain: Secondary | ICD-10-CM | POA: Diagnosis not present

## 2017-03-17 DIAGNOSIS — R197 Diarrhea, unspecified: Secondary | ICD-10-CM | POA: Diagnosis not present

## 2017-03-17 NOTE — Telephone Encounter (Signed)
Pt called states she is in office with Dr Ferdinand Lango GI and they need [place] to fax immediately labs from 03/15/17 espec the stool. Forwarded to tc to fax [phone].

## 2017-03-17 NOTE — Telephone Encounter (Signed)
Received request for Medical Records from [facility]; forwarded to [place] for email/scana/SLS 09/28

## 2017-03-17 NOTE — Telephone Encounter (Signed)
Patient aware of results/see result notes

## 2017-03-18 LAB — TIQ-NTM

## 2017-03-18 LAB — STOOL CULTURE
MICRO NUMBER: 81062157
MICRO NUMBER: 81062158
MICRO NUMBER:: 81062159
SHIGA RESULT: NOT DETECTED
SPECIMEN QUALITY: ADEQUATE
SPECIMEN QUALITY:: ADEQUATE
SPECIMEN QUALITY:: ADEQUATE

## 2017-03-21 DIAGNOSIS — R1084 Generalized abdominal pain: Secondary | ICD-10-CM | POA: Diagnosis not present

## 2017-03-22 DIAGNOSIS — R1084 Generalized abdominal pain: Secondary | ICD-10-CM | POA: Diagnosis not present

## 2017-03-23 ENCOUNTER — Telehealth: Payer: Self-pay | Admitting: Family Medicine

## 2017-03-23 NOTE — Telephone Encounter (Signed)
Pt states she wants to keep [facility] informed of her progress with the GI doc she has been seeing. Pt states she had CAT scan and [place] and is scheduled for colonoscopy on 03/29/17. Pt states Doxycycline did not help. Pt states diarrhea over a month. Pt lost 22 lb all together.

## 2017-03-24 NOTE — Telephone Encounter (Signed)
YL-Plz see FYI below/thx dmf

## 2017-03-24 NOTE — Telephone Encounter (Signed)
Thanks for keeping [place] informed

## 2017-03-27 ENCOUNTER — Telehealth: Payer: Self-pay | Admitting: Family Medicine

## 2017-03-27 ENCOUNTER — Emergency Department (HOSPITAL_BASED_OUTPATIENT_CLINIC_OR_DEPARTMENT_OTHER)
Admission: EM | Admit: 2017-03-27 | Discharge: 2017-03-27 | Disposition: A | Discharge status: Home / Self Care | Payer: Medicare HMO | Attending: Emergency Medicine | Admitting: Emergency Medicine

## 2017-03-27 ENCOUNTER — Encounter (HOSPITAL_BASED_OUTPATIENT_CLINIC_OR_DEPARTMENT_OTHER): Payer: Self-pay | Admitting: *Deleted

## 2017-03-27 DIAGNOSIS — I10 Essential (primary) hypertension: Secondary | ICD-10-CM | POA: Diagnosis not present

## 2017-03-27 DIAGNOSIS — I2510 Atherosclerotic heart disease of native coronary artery without angina pectoris: Secondary | ICD-10-CM | POA: Insufficient documentation

## 2017-03-27 DIAGNOSIS — R197 Diarrhea, unspecified: Secondary | ICD-10-CM

## 2017-03-27 DIAGNOSIS — Z7902 Long term (current) use of antithrombotics/antiplatelets: Secondary | ICD-10-CM | POA: Diagnosis not present

## 2017-03-27 DIAGNOSIS — Z79899 Other long term (current) drug therapy: Secondary | ICD-10-CM | POA: Diagnosis not present

## 2017-03-27 DIAGNOSIS — I252 Old myocardial infarction: Secondary | ICD-10-CM | POA: Diagnosis not present

## 2017-03-27 DIAGNOSIS — F17210 Nicotine dependence, cigarettes, uncomplicated: Secondary | ICD-10-CM | POA: Diagnosis not present

## 2017-03-27 LAB — URINALYSIS, ROUTINE W REFLEX MICROSCOPIC
BILIRUBIN URINE: NEGATIVE
GLUCOSE, UA: NEGATIVE mg/dL
Hgb urine dipstick: NEGATIVE
KETONES UR: NEGATIVE mg/dL
LEUKOCYTES UA: NEGATIVE
Nitrite: NEGATIVE
PH: 6 (ref 5.0–8.0)
PROTEIN: NEGATIVE mg/dL
Specific Gravity, Urine: <1.005 — ABNORMAL LOW (ref 1.005–1.030)

## 2017-03-27 LAB — CBC
HEMATOCRIT: 36.4 % (ref 36.0–46.0)
Hemoglobin: 12.1 g/dL (ref 12.0–15.0)
MCH: 27.9 pg (ref 26.0–34.0)
MCHC: 33.2 g/dL (ref 30.0–36.0)
MCV: 83.9 fL (ref 78.0–100.0)
PLATELETS: 211 10*3/uL (ref 150–400)
RBC: 4.34 MIL/uL (ref 3.87–5.11)
RDW: 13.5 % (ref 11.5–15.5)
WBC: 9.3 10*3/uL (ref 4.0–10.5)

## 2017-03-27 LAB — COMPREHENSIVE METABOLIC PANEL
ALBUMIN: 3.5 g/dL (ref 3.5–5.0)
ALT: 10 U/L — AB (ref 14–54)
AST: 15 U/L (ref 15–41)
Alkaline Phosphatase: 85 U/L (ref 38–126)
Anion gap: 6 (ref 5–15)
BILIRUBIN TOTAL: 0.3 mg/dL (ref 0.3–1.2)
BUN: 10 mg/dL (ref 6–20)
CHLORIDE: 112 mmol/L — AB (ref 101–111)
CO2: 22 mmol/L (ref 22–32)
CREATININE: 0.56 mg/dL (ref 0.44–1.00)
Calcium: 8.8 mg/dL — ABNORMAL LOW (ref 8.9–10.3)
GFR calc Af Amer: >60 mL/min (ref >60)
GLUCOSE: 123 mg/dL — AB (ref 65–99)
Potassium: 3.5 mmol/L (ref 3.5–5.1)
Sodium: 140 mmol/L (ref 135–145)
TOTAL PROTEIN: 6.3 g/dL — AB (ref 6.5–8.1)

## 2017-03-27 LAB — LIPASE, BLOOD: Lipase: 23 U/L (ref 11–51)

## 2017-03-27 LAB — OCCULT BLOOD X 1 CARD TO LAB, STOOL: Fecal Occult Bld: NEGATIVE

## 2017-03-27 MED ORDER — SODIUM CHLORIDE 0.9 % IV BOLUS (SEPSIS)
1000.0000 mL | Freq: Once | INTRAVENOUS | Status: AC
  Administered 2017-03-27: 1000 mL via INTRAVENOUS

## 2017-03-27 NOTE — Telephone Encounter (Signed)
Patient Name: Sandra [name]  DOB: December 15, 1960    Initial Comment callers wife has uncontrollable diarhea this morning    Nurse Assessment  Nurse: Neena Rhymes, RN, Sharyn Lull Date/Time (Eastern Time): 03/27/2017 8:52:31 AM  Confirm and document reason for call. If symptomatic, describe symptoms. ---Callers wife has diarrhea for 2 months. On dulcolax for one month prescribed by Dr. Etter Sjogren for questionable IBS This morning she has had 3 episodes and was not able to make it to the bathroom. Denies or fever vomiting. Has been referred to GI Dr. Ferdinand Lango.  Does the patient have any new or worsening symptoms? ---Yes  Will a triage be completed? ---Yes  Related visit to physician within the last 2 weeks? ---No  Does the PT have any chronic conditions? (i.e. diabetes, asthma, etc.) ---Yes  List chronic conditions. ---CVA, MI, HBP ? IBS  Is this a behavioral health or substance abuse call? ---No     Guidelines    Guideline Title Affirmed Question Affirmed Notes  Diarrhea Black or tarry bowel movements (Exception: chronic-unchanged black-grey bowel movements AND is taking iron pills or Pepto-Bismol)    Final Disposition User   Go to ED Now Neena Rhymes, RN, Sharyn Lull    Referrals  [facility] - ED   Caller Disagree/Comply Comply  Caller Understands Yes  PreDisposition Call Doctor

## 2017-03-27 NOTE — ED Provider Notes (Signed)
[facility] Provider Note   CSN: [id number] Arrival date & time: 03/27/17  7628     History   Chief Complaint Chief Complaint  Patient presents with  . Diarrhea    HPI Seleta Hovland is a 56 y.o. female.  Patient is a 56 year old female who presents with diarrhea. She states that she's had diarrhea for about 2 months. It's been watery and nonbloody. She has been seen by her primary care physician for these symptoms. She had recent stool studies and C. difficile which were negative. She has some crampy abdominal pain but no other persistent abdominal pain. She recently has started to see a gastroenterologist, Dr. Ferdinand Lango at [facility]. He has done a CT scan and abdominal ultrasound and patient is scheduled to have a colonoscopy in 2 days. Due to the upcoming colonoscopy, she was told to take Dulcolax starting on October 1. She states it Dulcolax has made her diarrhea much worse and now she is pooping everywhere. She is having black stools. She denies any fevers.  No lightheadedness. No chest pain or shortness of breath. No blood in her stool. No urinary symptoms.      Past Medical History:  Diagnosis Date  . Acute right MCA stroke ([place]) 01/27/11   Acute R MCA stroked  with L arm and leg weakness  . Anxiety 1980s   On Klonipin since age 70. Had addiction problem with Xanax which  Was therefore d/c . Seen in the past at [facility].   . Arthritis 04/16/2011  . Blind right eye   . CAD (coronary artery disease) 2007   istory of MI with stent in 2007 by Dr. Chancy Milroy, [facility]. Stent placement by Dr Cleatis Polka   . Depression 03/07/2011  . Fracture of fifth toe, left, closed 01/28/2011   History of left fifth toe proximal phalanx fracture when patient had a stroke (01/2011) and fell. Seen by Dr Doran Durand.    . Fractured toe 01/2011   History of left fifth toe proximal phalanx fracture when patient had a stroke (01/2011) and fell. Seen by Dr Doran Durand.   .  Hyperlipidemia 03/07/2011  . Hypertension 03/07/2011  . Incontinence 04/14/2011  . Myocardial infarct, old 2008   stents  . PFO (patent foramen ovale) August 2012   PFO seen on TEE during hospitalization in 01/2011.  Patient to f/u with Dr. Leonie Man, neurology, for enrollment in trial for medical treatment of PFO  . Pneumonia   . Prediabetes 03/07/2011  . Psoriasis 03/07/2011  . Substance abuse ([place])    h/o narcotic abuse pt denies as of 01/09/12  . Tobacco abuse 03/07/2011   Quit 01/2011     Patient Active Problem List   Diagnosis Date Noted  . Undiagnosed cardiac murmurs 06/24/2016  . Morbid obesity ([place]) 06/05/2015  . Spastic hemiplegia affecting nondominant side ([place]) 04/02/2015  . Hypokalemia 12/23/2013  . Cold intolerance 11/25/2013  . Recurrent pneumonia 11/06/2013  . MRSA bacteremia 11/06/2013  . Bilateral endophthalmitis 11/06/2013  . Metabolic syndrome 31/51/7616  . Routine adult health maintenance 08/13/2013  . Dyspareunia, female 02/06/2013  . Lumbar and sacral osteoarthritis 12/06/2012  . Diarrhea 11/15/2012  . Insomnia 07/23/2012  . HIV exposure 06/27/2012  . Gout 01/31/2012  . Benzodiazepine dependence, continuous ([place]) 01/26/2012    Class: Chronic  . Hot flashes 01/09/2012  . Tobacco abuse 01/09/2012  . Arthritis 04/16/2011  . Anxiety 04/14/2011  . Incontinence 04/14/2011  . PFO (patent foramen ovale) 03/07/2011  . CAD (coronary artery  disease) 03/07/2011  . Essential hypertension 03/07/2011  . Hyperlipidemia 03/07/2011  . Depression 03/07/2011  . Psoriasis 03/07/2011  . H/O: CVA (cerebrovascular accident) 01/27/2011    Past Surgical History:  Procedure Laterality Date  . cardiac stent     2008  . EYE SURGERY    . IR GENERIC HISTORICAL  03/16/2016   IR RADIOLOGIST EVAL & MGMT 03/16/2016 Marybelle Killings, MD [facility] INTERV RAD    OB History    No data available       Home Medications    Prior to Admission medications   Medication Sig Start Date End Date  Taking? Authorizing Provider  allopurinol (ZYLOPRIM) 300 MG tablet Take 1 tablet (300 mg total) by mouth daily. 10/18/16   Roma Schanz R, DO  amLODipine (NORVASC) 5 MG tablet Take 2 tablets (10 mg total) by mouth daily. 10/18/16   Roma Schanz R, DO  atropine 1 % ophthalmic solution  03/20/15   [provider]  cloNIDine (CATAPRES) 0.2 MG tablet Take 1 tablet (0.2 mg total) by mouth 2 (two) times daily. 10/19/16   Lelon Perla, MD  clopidogrel (PLAVIX) 75 MG tablet Take 1 tablet (75 mg total) by mouth daily. 03/14/17   Ann Held, DO  dicyclomine (BENTYL) 20 MG tablet Take 1 tablet (20 mg total) by mouth 4 (four) times daily -  before meals and at bedtime. 03/14/17   Ann Held, DO  eszopiclone (LUNESTA) 2 MG TABS tablet Take 1 tablet (2 mg total) by mouth at bedtime as needed. for sleep 08/05/15   Debbrah Alar, NP  LORazepam (ATIVAN) 1 MG tablet TAKE 1 TABLET AT BEDTIME AS NEEDED FOR ANXIETY  OR FOR SLEEP 02/23/17   Carollee Herter, Alferd Apa, DO  losartan (COZAAR) 100 MG tablet Take 1 tablet (100 mg total) by mouth daily. 01/03/17   Ann Held, DO  metoprolol (LOPRESSOR) 100 MG tablet Take 1 tablet (100 mg total) by mouth 2 (two) times daily. 10/18/16   Ann Held, DO  omeprazole (PRILOSEC) 20 MG capsule Take 1 capsule (20 mg total) by mouth daily. 03/14/17   Ann Held, DO  potassium chloride (MICRO-K) 10 MEQ CR capsule Take 2 capsules (20 mEq total) by mouth daily. 10/18/16   Ann Held, DO  prednisoLONE acetate (PRED FORTE) 1 % ophthalmic suspension 1 drop 4 (four) times daily.    [provider]  promethazine (PHENERGAN) 25 MG tablet Take 1 tablet (25 mg total) by mouth every 8 (eight) hours as needed for nausea or vomiting. 10/02/16   Lawyer, Harrell Gave, PA-C  rosuvastatin (CRESTOR) 40 MG tablet Take 1 tablet (40 mg total) by mouth daily. 10/19/16 01/17/17  Lelon Perla, MD  spironolactone (ALDACTONE) 25  MG tablet Take 1 tablet (25 mg total) by mouth daily. 10/18/16   Roma Schanz R, DO  tiZANidine (ZANAFLEX) 4 MG tablet TAKE 2 TABLETS BY MOUTH TWICE DAILY 07/22/16   Ann Held, DO    Family History Family History  Problem Relation Age of Onset  . Heart disease Mother   . Diabetes Mother   . Heart disease Father   . Diabetes Father   . Cancer Sister 90       ovarian  . Heart disease Brother   . Diabetes Brother   . Heart disease Maternal Grandmother   . Heart disease Maternal Grandfather   . Heart disease Paternal Grandmother   .  Heart disease Paternal Grandfather   . Lung cancer Brother   . Heart disease Brother     Social History Social History  Substance Use Topics  . Smoking status: Current Every Day Smoker    Packs/day: 2.00    Years: 42.00    Types: Cigarettes  . Smokeless tobacco: Never Used     Comment: Trying to quit again.  . Alcohol use No     Allergies   Azor [amlodipine-olmesartan] and Trintellix [vortioxetine]   Review of Systems Review of Systems  Constitutional: Negative for chills, diaphoresis, fatigue and fever.  HENT: Negative for congestion, rhinorrhea and sneezing.   Eyes: Negative.   Respiratory: Negative for cough, chest tightness and shortness of breath.   Cardiovascular: Negative for chest pain and leg swelling.  Gastrointestinal: Positive for abdominal pain and diarrhea. Negative for blood in stool, nausea and vomiting.  Genitourinary: Negative for difficulty urinating, flank pain, frequency and hematuria.  Musculoskeletal: Negative for arthralgias and back pain.  Skin: Negative for rash.  Neurological: Negative for dizziness, speech difficulty, weakness, numbness and headaches.     Physical Exam Updated Vital Signs BP (!) 122/57 (BP Location: Left Arm)   Pulse 69   Temp 98.4 F (36.9 C) (Oral)   Resp 16   SpO2 99%   Physical Exam  Constitutional: She is oriented to person, place, and time. She appears  well-developed and well-nourished.  HENT:  Head: Normocephalic and atraumatic.  Eyes: Pupils are equal, round, and reactive to light.  Neck: Normal range of motion. Neck supple.  Cardiovascular: Normal rate, regular rhythm and normal heart sounds.   Pulmonary/Chest: Effort normal and breath sounds normal. No respiratory distress. She has no wheezes. She has no rales. She exhibits no tenderness.  Abdominal: Soft. Bowel sounds are normal. There is no tenderness. There is no rebound and no guarding.  Musculoskeletal: Normal range of motion. She exhibits no edema.  Lymphadenopathy:    She has no cervical adenopathy.  Neurological: She is alert and oriented to person, place, and time.  Skin: Skin is warm and dry. No rash noted.  Psychiatric: She has a normal mood and affect.     ED Treatments / Results  Labs (all labs ordered are listed, but only abnormal results are displayed) Labs Reviewed  COMPREHENSIVE METABOLIC PANEL - Abnormal; Notable for the following:       Result Value   Chloride 112 (*)    Glucose, Bld 123 (*)    Calcium 8.8 (*)    Total Protein 6.3 (*)    ALT 10 (*)    All other components within normal limits  URINALYSIS, ROUTINE W REFLEX MICROSCOPIC - Abnormal; Notable for the following:    Specific Gravity, Urine <1.005 (*)    All other components within normal limits  LIPASE, BLOOD  CBC  OCCULT BLOOD X 1 CARD TO LAB, STOOL    EKG  EKG Interpretation None       Radiology No results found.  Procedures Procedures (including critical care time)  Medications Ordered in ED Medications  sodium chloride 0.9 % bolus 1,000 mL (0 mLs Intravenous Stopped 03/27/17 1235)     Initial Impression / Assessment and Plan / ED Course  I have reviewed the triage vital signs and the nursing notes.  Pertinent labs & imaging results that were available during my care of the patient were reviewed by me and considered in my medical decision making (see chart for  details).     Patient is  56 year old female who presents with diarrhea. This is been going on for about 2 months but got worse after she started Dulcolax. Her labs are non-concerning. She has no abdominal pain on exam. She was given IV fluids in the ED and feels a little bit better. She has no ongoing diarrhea. She was discharged home in good condition. I did encourage her to contact her gastroenterologist before taking ongoing Dulcolax. She was encouraged to make an appointment to follow-up with her gastroenterologist.  Final Clinical Impressions(s) / ED Diagnoses   Final diagnoses:  Diarrhea, unspecified type    New Prescriptions New Prescriptions   No medications on file     Malvin Johns, MD 03/27/17 1238

## 2017-03-27 NOTE — ED Triage Notes (Signed)
Pt reports diarrhea x 2 months, states her gi doctor is aware and has scheduled colonoscopy on 10/10, last 2 nights she has taken dulcolax before bedtime to prep for the procedure and states "now I'm pooping everywhere, all the time! I can't even make it to the bathroom!" pt denies pain at this time, states "I'm not hurting but I might poop my pants at any minute!"

## 2017-03-27 NOTE — ED Notes (Signed)
Pt attempted to give urine sample but spilled it in the floor before getting the cap on it.

## 2017-03-27 NOTE — ED Notes (Signed)
Pt with no diarrhea during ED visit

## 2017-03-27 NOTE — ED Notes (Signed)
ED Provider at bedside. 

## 2017-03-31 ENCOUNTER — Ambulatory Visit: Payer: Medicare HMO | Admitting: Nurse Practitioner

## 2017-04-05 DIAGNOSIS — Z1211 Encounter for screening for malignant neoplasm of colon: Secondary | ICD-10-CM | POA: Diagnosis not present

## 2017-04-05 DIAGNOSIS — Z01818 Encounter for other preprocedural examination: Secondary | ICD-10-CM | POA: Diagnosis not present

## 2017-04-05 DIAGNOSIS — R197 Diarrhea, unspecified: Secondary | ICD-10-CM | POA: Diagnosis not present

## 2017-04-05 DIAGNOSIS — K635 Polyp of colon: Secondary | ICD-10-CM | POA: Diagnosis not present

## 2017-04-05 LAB — HM COLONOSCOPY

## 2017-04-06 ENCOUNTER — Telehealth: Payer: Self-pay | Admitting: Family Medicine

## 2017-04-06 NOTE — Telephone Encounter (Signed)
°  Relation to OI:LNZV  Call back number:[phone] Pharmacy:  Reason for call:  PCP referred patient to Dr. Harrell Lark at [facility] gastrologist. Patient was seen by specialist 03/17/17 and 04/05/17 and would like to confirm if reports were received, specialist faxed over report to [phone].

## 2017-04-08 DIAGNOSIS — K529 Noninfective gastroenteritis and colitis, unspecified: Secondary | ICD-10-CM | POA: Diagnosis not present

## 2017-04-10 DIAGNOSIS — K529 Noninfective gastroenteritis and colitis, unspecified: Secondary | ICD-10-CM | POA: Diagnosis not present

## 2017-04-11 DIAGNOSIS — K635 Polyp of colon: Secondary | ICD-10-CM | POA: Diagnosis not present

## 2017-04-12 NOTE — Telephone Encounter (Signed)
Received Medical records from Dr Ferdinand Lango at [facility]; forwarded to provider, pt informed via My Chart/SLS 10/24

## 2017-04-16 ENCOUNTER — Other Ambulatory Visit: Payer: Self-pay | Admitting: Family Medicine

## 2017-04-19 DIAGNOSIS — Z86010 Personal history of colonic polyps: Secondary | ICD-10-CM | POA: Diagnosis not present

## 2017-04-19 DIAGNOSIS — K529 Noninfective gastroenteritis and colitis, unspecified: Secondary | ICD-10-CM | POA: Diagnosis not present

## 2017-04-19 DIAGNOSIS — K8020 Calculus of gallbladder without cholecystitis without obstruction: Secondary | ICD-10-CM | POA: Diagnosis not present

## 2017-04-20 NOTE — Progress Notes (Signed)
HPI: FU CAD. Echocardiogram January 2018 showed normal LV systolic function, moderate left ventricular hypertrophy, grade 2 diastolic dysfunction. Patient states that she had PCI in [place] 15 years ago for myocardial infarction. No records available. Nuclear study March 2018 showed ejection fraction 52% and no ischemia or infarct. Chest CT April 2018 showed coronary calcification and 1.4 cm nodule in the thyroid and ultrasound recommended. Mild emphysema. Since last seen, patient denies dyspnea, chest pain, palpitations or syncope.  Current Outpatient Medications  Medication Sig Dispense Refill  . allopurinol (ZYLOPRIM) 300 MG tablet TAKE 1 TABLET BY MOUTH ONCE DAILY 90 tablet 1  . amLODipine (NORVASC) 5 MG tablet Take 2 tablets (10 mg total) by mouth daily. 180 tablet 1  . atropine 1 % ophthalmic solution     . cloNIDine (CATAPRES) 0.2 MG tablet Take 1 tablet (0.2 mg total) by mouth 2 (two) times daily. 180 tablet 3  . clopidogrel (PLAVIX) 75 MG tablet Take 1 tablet (75 mg total) by mouth daily. 90 tablet 3  . dicyclomine (BENTYL) 20 MG tablet Take 1 tablet (20 mg total) by mouth 4 (four) times daily -  before meals and at bedtime. 60 tablet 2  . eszopiclone (LUNESTA) 2 MG TABS tablet Take 1 tablet (2 mg total) by mouth at bedtime as needed. for sleep 30 tablet 0  . LORazepam (ATIVAN) 1 MG tablet TAKE 1 TABLET AT BEDTIME AS NEEDED FOR ANXIETY  OR FOR SLEEP 90 tablet 1  . losartan (COZAAR) 100 MG tablet Take 1 tablet (100 mg total) by mouth daily. 30 tablet 3  . metoprolol (LOPRESSOR) 100 MG tablet Take 1 tablet (100 mg total) by mouth 2 (two) times daily. 180 tablet 1  . omeprazole (PRILOSEC) 20 MG capsule Take 1 capsule (20 mg total) by mouth daily. 30 capsule 3  . potassium chloride (MICRO-K) 10 MEQ CR capsule Take 2 capsules (20 mEq total) by mouth daily. 60 capsule 2  . prednisoLONE acetate (PRED FORTE) 1 % ophthalmic suspension 1 drop 4 (four) times daily.    . promethazine  (PHENERGAN) 25 MG tablet Take 1 tablet (25 mg total) by mouth every 8 (eight) hours as needed for nausea or vomiting. 15 tablet 0  . spironolactone (ALDACTONE) 25 MG tablet TAKE 1 TABLET BY MOUTH ONCE DAILY 30 tablet 5  . tiZANidine (ZANAFLEX) 4 MG tablet TAKE 2 TABLETS BY MOUTH TWICE DAILY 270 tablet 1  . rosuvastatin (CRESTOR) 40 MG tablet Take 1 tablet (40 mg total) by mouth daily. 90 tablet 3   No current facility-administered medications for this visit.      Past Medical History:  Diagnosis Date  . Acute right MCA stroke ([place]) 01/27/11   Acute R MCA stroked  with L arm and leg weakness  . Anxiety 1980s   On Klonipin since age 43. Had addiction problem with Xanax which  Was therefore d/c . Seen in the past at [facility].   . Arthritis 04/16/2011  . Blind right eye   . CAD (coronary artery disease) 2007   istory of MI with stent in 2007 by Dr. Chancy Milroy, [facility]. Stent placement by Dr Cleatis Polka   . Depression 03/07/2011  . Fracture of fifth toe, left, closed 01/28/2011   History of left fifth toe proximal phalanx fracture when patient had a stroke (01/2011) and fell. Seen by Dr Doran Durand.    . Fractured toe 01/2011   History of left fifth toe proximal phalanx fracture when patient had  a stroke (01/2011) and fell. Seen by Dr Doran Durand.   . Hyperlipidemia 03/07/2011  . Hypertension 03/07/2011  . Incontinence 04/14/2011  . Myocardial infarct, old 2008   stents  . PFO (patent foramen ovale) August 2012   PFO seen on TEE during hospitalization in 01/2011.  Patient to f/u with Dr. Leonie Man, neurology, for enrollment in trial for medical treatment of PFO  . Pneumonia   . Prediabetes 03/07/2011  . Psoriasis 03/07/2011  . Substance abuse ([place])    h/o narcotic abuse pt denies as of 01/09/12  . Tobacco abuse 03/07/2011   Quit 01/2011     Past Surgical History:  Procedure Laterality Date  . cardiac stent     2008  . EYE SURGERY    . IR GENERIC HISTORICAL  03/16/2016   IR RADIOLOGIST EVAL &  MGMT 03/16/2016 Marybelle Killings, MD [facility] INTERV RAD    Social History   Socioeconomic History  . Marital status: Married    Spouse name: dexter  . Number of children: 1  . Years of education: 79  . Highest education level: Not on file  Social Needs  . Financial resource strain: Not on file  . Food insecurity - worry: Not on file  . Food insecurity - inability: Not on file  . Transportation needs - medical: Not on file  . Transportation needs - non-medical: Not on file  Occupational History  . Occupation: Disabled  Tobacco Use  . Smoking status: Current Every Day Smoker    Packs/day: 2.00    Years: 42.00    Pack years: 84.00    Types: Cigarettes  . Smokeless tobacco: Never Used  . Tobacco comment: Trying to quit again.  Substance and Sexual Activity  . Alcohol use: No    Alcohol/week: 0.0 oz  . Drug use: No  . Sexual activity: Yes    Partners: Male    Birth control/protection: None  Other Topics Concern  . Not on file  Social History Narrative   Pt married and lives with husband.     Family History  Problem Relation Age of Onset  . Heart disease Mother   . Diabetes Mother   . Heart disease Father   . Diabetes Father   . Cancer Sister 55       ovarian  . Heart disease Brother   . Diabetes Brother   . Heart disease Maternal Grandmother   . Heart disease Maternal Grandfather   . Heart disease Paternal Grandmother   . Heart disease Paternal Grandfather   . Lung cancer Brother   . Heart disease Brother     ROS: intermittent right upper quadrant pain but no fevers or chills, productive cough, hemoptysis, dysphasia, odynophagia, melena, hematochezia, dysuria, hematuria, rash, seizure activity, orthopnea, PND, pedal edema, claudication. Remaining systems are negative.  Physical Exam: Well-developed well-nourished in no acute distress.  Skin is warm and dry.  HEENT is normal.  Neck is supple.  Chest is clear to auscultation with normal expansion.  Cardiovascular  exam is regular rate and rhythm. 2/6 systolic murmur left sternal border. Abdominal exam nontender or distended. No masses palpated. Extremities show no edema. neuro mild residual deficits from previous CVA   A/P  1 coronary artery disease-patient is doing well with no chest pain or dyspnea. Plan to continue medical therapy with Plavix and statin.  2 hypertension-blood pressure is elevated. She states typically control. Continue present medications and advance as needed. Would increase clonidine iif additional therapy needed.  3 hyperlipidemia-continue  statin. Last lipid panel September 2018 personally reviewed. Total cholesterol 93, triglycerides 227 and direct LDL 41. Liver functions normal.  4 history of CVA and patent foramen ovale-this occurred in 2012 with no recurrences. We will continue Plavix. I have elected not to pursue closure of PFO given duration without recurrence.  5 tobacco abuse-patient counseled on discontinuing.  6 thyroid nodule noted on CT-I have asked patient to follow-up with primary care to see if she needs thyroid ultrasound or additional testing. She states she will arrange.  7 Preoperative evaluation-she apparently has a gallstone and may require cholecystectomy. Given no symptoms and recent negative nuclear study she may proceed without further cardiac evaluation.  Kirk Ruths, MD

## 2017-04-25 ENCOUNTER — Ambulatory Visit: Payer: Commercial Managed Care - HMO | Admitting: Podiatry

## 2017-04-26 DIAGNOSIS — K8020 Calculus of gallbladder without cholecystitis without obstruction: Secondary | ICD-10-CM | POA: Insufficient documentation

## 2017-04-26 DIAGNOSIS — Z7901 Long term (current) use of anticoagulants: Secondary | ICD-10-CM | POA: Diagnosis not present

## 2017-04-30 ENCOUNTER — Other Ambulatory Visit: Payer: Self-pay | Admitting: Family Medicine

## 2017-04-30 DIAGNOSIS — I10 Essential (primary) hypertension: Secondary | ICD-10-CM

## 2017-05-01 ENCOUNTER — Ambulatory Visit: Payer: Medicare HMO | Admitting: Gastroenterology

## 2017-05-03 ENCOUNTER — Encounter: Payer: Self-pay | Admitting: Cardiology

## 2017-05-03 ENCOUNTER — Ambulatory Visit (INDEPENDENT_AMBULATORY_CARE_PROVIDER_SITE_OTHER): Payer: Medicare HMO | Admitting: Cardiology

## 2017-05-03 VITALS — BP 159/96 | HR 78 | Ht 65.0 in | Wt 175.0 lb

## 2017-05-03 DIAGNOSIS — I2510 Atherosclerotic heart disease of native coronary artery without angina pectoris: Secondary | ICD-10-CM

## 2017-05-03 DIAGNOSIS — I10 Essential (primary) hypertension: Secondary | ICD-10-CM

## 2017-05-03 DIAGNOSIS — E7800 Pure hypercholesterolemia, unspecified: Secondary | ICD-10-CM

## 2017-05-03 NOTE — Patient Instructions (Signed)
Your physician wants you to follow-up in: ONE YEAR WITH DR CRENSHAW You will receive a reminder letter in the mail two months in advance. If you don't receive a letter, please call our office to schedule the follow-up appointment.   If you need a refill on your cardiac medications before your next appointment, please call your pharmacy.  

## 2017-06-01 ENCOUNTER — Other Ambulatory Visit: Payer: Self-pay | Admitting: Family Medicine

## 2017-06-02 ENCOUNTER — Telehealth: Payer: Self-pay | Admitting: *Deleted

## 2017-06-02 DIAGNOSIS — R9389 Abnormal findings on diagnostic imaging of other specified body structures: Secondary | ICD-10-CM

## 2017-06-02 NOTE — Telephone Encounter (Signed)
We need to order a thyroid US  Dx thyroid nodule on ct

## 2017-06-02 NOTE — Telephone Encounter (Signed)
Patient wanted to know the next step about her ct scan that was done in April.  Its in epic.  It showed thyroid nodule.  What is the next step?

## 2017-06-05 ENCOUNTER — Telehealth: Payer: Self-pay | Admitting: Family Medicine

## 2017-06-05 ENCOUNTER — Ambulatory Visit: Payer: Self-pay

## 2017-06-05 NOTE — Telephone Encounter (Signed)
Patient notified and [place] ordered

## 2017-06-05 NOTE — Telephone Encounter (Signed)
Copied from [place] [id number]. Topic: Quick Communication - See Telephone Encounter >> Jun 05, 2017  8:52 AM Boyd Kerbs wrote: CRM for notification. See Telephone encounter for:  Patient says a Chancy Milroy and is nausea,  Patient is also wanting to find out about Nodule on Thyroid in throat and may need biopsy. Has not heard from Dr. Carollee Herter regarding this or her gall stone.  She is request medicine for nausea. She is also asking for nurse to call her back about thyroid report. Did not want to wait for nurse to come to phone, ask for them to call her back. Michela Pitcher ok to leave detailed message on phone 06/05/17.

## 2017-06-05 NOTE — Telephone Encounter (Signed)
Pt. Requesting follow up for Thyroid "Knot."  Pt. States her cardiologist told her to follow up with her PCP. Appointment made. Instructed to call back as needed.

## 2017-06-06 ENCOUNTER — Other Ambulatory Visit: Payer: Self-pay | Admitting: Family Medicine

## 2017-06-06 ENCOUNTER — Ambulatory Visit (HOSPITAL_BASED_OUTPATIENT_CLINIC_OR_DEPARTMENT_OTHER)
Admission: RE | Admit: 2017-06-06 | Discharge: 2017-06-06 | Disposition: A | Discharge status: Home / Self Care | Payer: Medicare HMO | Source: Ambulatory Visit | Attending: Family Medicine | Admitting: Family Medicine

## 2017-06-06 ENCOUNTER — Telehealth: Payer: Self-pay | Admitting: Family Medicine

## 2017-06-06 DIAGNOSIS — E041 Nontoxic single thyroid nodule: Secondary | ICD-10-CM

## 2017-06-06 DIAGNOSIS — R9389 Abnormal findings on diagnostic imaging of other specified body structures: Secondary | ICD-10-CM | POA: Insufficient documentation

## 2017-06-06 DIAGNOSIS — E042 Nontoxic multinodular goiter: Secondary | ICD-10-CM | POA: Insufficient documentation

## 2017-06-06 NOTE — Telephone Encounter (Signed)
Spoke with patient. She was getting her [place]. Patient states that she is keeping lab appt on 12/28 & OV on 1/17.

## 2017-06-06 NOTE — Telephone Encounter (Signed)
Copied from [place]. Topic: Quick Communication - See Telephone Encounter >> Jun 06, 2017  1:53 PM Arletha Grippe wrote: CRM for notification. See Telephone encounter for:   06/06/17. Spouse called - pt would like to have biopsy done on thyroid nodule.  Cb# [phone] he would like to have done asap.  Pt request cb  [phone]

## 2017-06-06 NOTE — Telephone Encounter (Signed)
Can you guys schedule her as soon as possible.  Its for her thyroid US.  She said she wanted to wait til after christmas but she would like first available.

## 2017-06-07 ENCOUNTER — Telehealth: Payer: Self-pay | Admitting: Family Medicine

## 2017-06-07 NOTE — Telephone Encounter (Signed)
Copied from [place] [id number]. Topic: Quick Communication - See Telephone Encounter >> Jun 07, 2017  3:06 PM Clack, Laban Emperor wrote: CRM for notification. See Telephone encounter for: Pt was calling for labs results from her thyroid test.  06/07/17.

## 2017-06-08 NOTE — Telephone Encounter (Signed)
See [place] results

## 2017-06-15 ENCOUNTER — Other Ambulatory Visit: Payer: Self-pay | Admitting: *Deleted

## 2017-06-15 ENCOUNTER — Telehealth: Payer: Self-pay | Admitting: *Deleted

## 2017-06-15 DIAGNOSIS — E041 Nontoxic single thyroid nodule: Secondary | ICD-10-CM

## 2017-06-15 NOTE — Telephone Encounter (Signed)
Patient notified of results of ultrasound.  Referrals placed for biopsy and ent.

## 2017-06-15 NOTE — Telephone Encounter (Signed)
Copied from [place] [id number]. Topic: General - Other >> Jun 14, 2017 11:03 AM Yvette Rack wrote: Reason for CRM: patient calling stating that radiologist stated that she need a Biopsy please call pt about ultra sound

## 2017-06-16 ENCOUNTER — Other Ambulatory Visit (INDEPENDENT_AMBULATORY_CARE_PROVIDER_SITE_OTHER): Payer: Medicare HMO

## 2017-06-16 DIAGNOSIS — F17200 Nicotine dependence, unspecified, uncomplicated: Secondary | ICD-10-CM | POA: Diagnosis not present

## 2017-06-16 DIAGNOSIS — E785 Hyperlipidemia, unspecified: Secondary | ICD-10-CM

## 2017-06-16 DIAGNOSIS — Z79899 Other long term (current) drug therapy: Secondary | ICD-10-CM | POA: Diagnosis not present

## 2017-06-16 DIAGNOSIS — M5136 Other intervertebral disc degeneration, lumbar region: Secondary | ICD-10-CM | POA: Diagnosis not present

## 2017-06-16 DIAGNOSIS — E119 Type 2 diabetes mellitus without complications: Secondary | ICD-10-CM

## 2017-06-16 DIAGNOSIS — M129 Arthropathy, unspecified: Secondary | ICD-10-CM | POA: Diagnosis not present

## 2017-06-16 DIAGNOSIS — Z780 Asymptomatic menopausal state: Secondary | ICD-10-CM | POA: Diagnosis not present

## 2017-06-16 DIAGNOSIS — E559 Vitamin D deficiency, unspecified: Secondary | ICD-10-CM | POA: Diagnosis not present

## 2017-06-16 DIAGNOSIS — D539 Nutritional anemia, unspecified: Secondary | ICD-10-CM | POA: Diagnosis not present

## 2017-06-16 DIAGNOSIS — M545 Low back pain: Secondary | ICD-10-CM | POA: Diagnosis not present

## 2017-06-16 DIAGNOSIS — Z87891 Personal history of nicotine dependence: Secondary | ICD-10-CM | POA: Diagnosis not present

## 2017-06-16 DIAGNOSIS — R5383 Other fatigue: Secondary | ICD-10-CM | POA: Diagnosis not present

## 2017-06-16 LAB — LIPID PANEL
CHOLESTEROL: 82 mg/dL (ref 0–200)
HDL: 26.3 mg/dL — ABNORMAL LOW (ref >39.00)
LDL CALC: 29 mg/dL (ref 0–99)
NonHDL: 55.4
TRIGLYCERIDES: 132 mg/dL (ref 0.0–149.0)
Total CHOL/HDL Ratio: 3
VLDL: 26.4 mg/dL (ref 0.0–40.0)

## 2017-06-16 LAB — COMPREHENSIVE METABOLIC PANEL
ALBUMIN: 4.1 g/dL (ref 3.5–5.2)
ALT: 12 U/L (ref 0–35)
AST: 14 U/L (ref 0–37)
Alkaline Phosphatase: 87 U/L (ref 39–117)
BUN: 13 mg/dL (ref 6–23)
CALCIUM: 9 mg/dL (ref 8.4–10.5)
CHLORIDE: 107 meq/L (ref 96–112)
CO2: 27 mEq/L (ref 19–32)
CREATININE: 0.65 mg/dL (ref 0.40–1.20)
GFR: 100.25 mL/min (ref >60.00)
Glucose, Bld: 111 mg/dL — ABNORMAL HIGH (ref 70–99)
POTASSIUM: 4.5 meq/L (ref 3.5–5.1)
Sodium: 140 mEq/L (ref 135–145)
Total Bilirubin: 0.5 mg/dL (ref 0.2–1.2)
Total Protein: 6.5 g/dL (ref 6.0–8.3)

## 2017-06-16 LAB — HEMOGLOBIN A1C: Hgb A1c MFr Bld: 6.8 % — ABNORMAL HIGH (ref 4.6–6.5)

## 2017-06-20 ENCOUNTER — Telehealth: Payer: Self-pay | Admitting: Family Medicine

## 2017-06-20 NOTE — Telephone Encounter (Signed)
-----   Message from Emi Holes, [place] sent at 06/16/2017  1:28 PM EST ----- Regarding: FW: US thyroid   ----- Message ----- From: Trenda Moots Sent: 06/16/2017  11:04 AM To: Pierce Crane, CMA Subject: RE: US thyroid                                 Claudette Laws, please advise ----- Message ----- From: Katha Hamming Sent: 06/15/2017  12:55 PM To: Trenda Moots Subject: US thyroid                                     Hey Gwen:  This is an order for a needle biopsy.  We do not do those here.  Either the hospital or [place].    Let me know and I will change the order.   Thanks! Hoyle Sauer

## 2017-06-22 ENCOUNTER — Ambulatory Visit: Payer: Medicare HMO | Admitting: Podiatry

## 2017-06-22 ENCOUNTER — Encounter: Payer: Self-pay | Admitting: Podiatry

## 2017-06-22 DIAGNOSIS — M79606 Pain in leg, unspecified: Secondary | ICD-10-CM | POA: Diagnosis not present

## 2017-06-22 DIAGNOSIS — L600 Ingrowing nail: Secondary | ICD-10-CM | POA: Diagnosis not present

## 2017-06-22 DIAGNOSIS — B351 Tinea unguium: Secondary | ICD-10-CM | POA: Diagnosis not present

## 2017-06-22 NOTE — Patient Instructions (Signed)
Seen for hypertrophic nails. All nails debrided. Return in 3 months or as needed.  

## 2017-06-22 NOTE — Progress Notes (Signed)
Subjective: 57 y.o. year old female patient presents complaining of painful nails. Patient requests toe nails, corns and calluses trimmed.   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 Left side weakness after stroke.  Objective: Dermatologic: Severely dystrophic and hypertrophic nails x 10. Painful ingrown nails on both big toes. Vascular: Pedal pulses are all palpable. Orthopedic: Contracted lesser digits  Neurologic: All epicritic and tactile sensations grossly intact.  Assessment: Dystrophic mycotic nails x 10. Ingrown nails both great toes. Pain in both great toe.  Treatment: All mycotic nails debrided.  Return in 3 months or as needed.

## 2017-06-23 DIAGNOSIS — Z79899 Other long term (current) drug therapy: Secondary | ICD-10-CM | POA: Diagnosis not present

## 2017-06-23 DIAGNOSIS — K8020 Calculus of gallbladder without cholecystitis without obstruction: Secondary | ICD-10-CM | POA: Diagnosis not present

## 2017-06-23 DIAGNOSIS — E539 Vitamin B deficiency, unspecified: Secondary | ICD-10-CM | POA: Diagnosis not present

## 2017-06-23 DIAGNOSIS — M545 Low back pain: Secondary | ICD-10-CM | POA: Diagnosis not present

## 2017-06-23 DIAGNOSIS — E559 Vitamin D deficiency, unspecified: Secondary | ICD-10-CM | POA: Diagnosis not present

## 2017-06-23 DIAGNOSIS — M5136 Other intervertebral disc degeneration, lumbar region: Secondary | ICD-10-CM | POA: Diagnosis not present

## 2017-06-27 DIAGNOSIS — K52832 Lymphocytic colitis: Secondary | ICD-10-CM | POA: Diagnosis not present

## 2017-06-27 DIAGNOSIS — K8020 Calculus of gallbladder without cholecystitis without obstruction: Secondary | ICD-10-CM | POA: Diagnosis not present

## 2017-07-06 ENCOUNTER — Ambulatory Visit: Payer: Medicare HMO | Admitting: Family Medicine

## 2017-07-12 ENCOUNTER — Telehealth: Payer: Self-pay

## 2017-07-12 DIAGNOSIS — K8020 Calculus of gallbladder without cholecystitis without obstruction: Secondary | ICD-10-CM | POA: Diagnosis not present

## 2017-07-12 NOTE — Telephone Encounter (Signed)
   [facility] Pre-operative Risk Assessment    Request for surgical clearance:  1. What type of surgery is being performed? gallbladder surgery   2. When is this surgery scheduled? pending clearance   3. What type of clearance is required (medical clearance vs. Pharmacy clearance to hold med vs. Both)? medical clearance  4. Are there any medications that need to be held prior to surgery and how long? clopidogrel; 5 days prior to surgery  5. Practice name and name of physician performing surgery? [facility] - Dr Autumn Messing  6. What is your office phone and fax number? phone: [phone], fax: [phone]   7. Anesthesia type (None, local, MAC, general) ? general anesthesia   Sandra [name], Sandra [name] 07/12/2017, 4:22 PM  _________________________________________________________________

## 2017-07-13 ENCOUNTER — Telehealth: Payer: Self-pay | Admitting: Cardiology

## 2017-07-13 NOTE — Telephone Encounter (Signed)
   Primary Cardiologist: Dr Stanford Breed  Chart reviewed as part of pre-operative protocol coverage. Given past medical history and time since last visit, based on ACC/AHA guidelines, Armina Galloway would be at acceptable risk for the planned procedure without further cardiovascular testing. OK to hold Plavix 5 days pre op.   I will route this recommendation to the requesting party via Epic fax function and remove from pre-op pool.  Please call with questions.  Kerin Ransom, PA-C 07/13/2017, 1:11 PM

## 2017-07-13 NOTE — Telephone Encounter (Signed)
See pre op call back  Mahamed Zalewski PA-C 07/13/2017 1:17 PM

## 2017-07-17 DIAGNOSIS — M5136 Other intervertebral disc degeneration, lumbar region: Secondary | ICD-10-CM | POA: Diagnosis not present

## 2017-07-17 DIAGNOSIS — Z760 Encounter for issue of repeat prescription: Secondary | ICD-10-CM | POA: Diagnosis not present

## 2017-07-17 DIAGNOSIS — Z79899 Other long term (current) drug therapy: Secondary | ICD-10-CM | POA: Diagnosis not present

## 2017-07-17 DIAGNOSIS — I10 Essential (primary) hypertension: Secondary | ICD-10-CM | POA: Diagnosis not present

## 2017-07-17 DIAGNOSIS — S32010A Wedge compression fracture of first lumbar vertebra, initial encounter for closed fracture: Secondary | ICD-10-CM | POA: Diagnosis not present

## 2017-07-18 ENCOUNTER — Ambulatory Visit: Payer: Self-pay | Admitting: General Surgery

## 2017-07-20 ENCOUNTER — Other Ambulatory Visit: Payer: Medicare HMO

## 2017-07-25 ENCOUNTER — Ambulatory Visit
Admission: RE | Admit: 2017-07-25 | Discharge: 2017-07-25 | Disposition: A | Discharge status: Home / Self Care | Payer: Medicare HMO | Source: Ambulatory Visit | Attending: Family Medicine | Admitting: Family Medicine

## 2017-07-25 ENCOUNTER — Other Ambulatory Visit (HOSPITAL_COMMUNITY)
Admission: RE | Admit: 2017-07-25 | Discharge: 2017-07-25 | Disposition: A | Discharge status: Home / Self Care | Payer: Medicare HMO | Source: Ambulatory Visit | Attending: Student | Admitting: Student

## 2017-07-25 ENCOUNTER — Encounter: Payer: Self-pay | Admitting: Gastroenterology

## 2017-07-25 DIAGNOSIS — E041 Nontoxic single thyroid nodule: Secondary | ICD-10-CM | POA: Insufficient documentation

## 2017-07-27 ENCOUNTER — Encounter (HOSPITAL_COMMUNITY): Payer: Self-pay

## 2017-07-27 ENCOUNTER — Other Ambulatory Visit: Payer: Self-pay

## 2017-07-27 ENCOUNTER — Encounter (HOSPITAL_COMMUNITY)
Admission: RE | Admit: 2017-07-27 | Discharge: 2017-07-27 | Disposition: A | Discharge status: Home / Self Care | Payer: Medicare HMO | Source: Ambulatory Visit | Attending: General Surgery | Admitting: General Surgery

## 2017-07-27 DIAGNOSIS — Z955 Presence of coronary angioplasty implant and graft: Secondary | ICD-10-CM | POA: Diagnosis not present

## 2017-07-27 DIAGNOSIS — I10 Essential (primary) hypertension: Secondary | ICD-10-CM | POA: Diagnosis not present

## 2017-07-27 DIAGNOSIS — H5440 Blindness, one eye, unspecified eye: Secondary | ICD-10-CM | POA: Diagnosis not present

## 2017-07-27 DIAGNOSIS — I739 Peripheral vascular disease, unspecified: Secondary | ICD-10-CM | POA: Diagnosis not present

## 2017-07-27 DIAGNOSIS — F329 Major depressive disorder, single episode, unspecified: Secondary | ICD-10-CM | POA: Diagnosis not present

## 2017-07-27 DIAGNOSIS — K8020 Calculus of gallbladder without cholecystitis without obstruction: Secondary | ICD-10-CM | POA: Diagnosis present

## 2017-07-27 DIAGNOSIS — Z87891 Personal history of nicotine dependence: Secondary | ICD-10-CM | POA: Diagnosis not present

## 2017-07-27 DIAGNOSIS — I69354 Hemiplegia and hemiparesis following cerebral infarction affecting left non-dominant side: Secondary | ICD-10-CM | POA: Diagnosis not present

## 2017-07-27 DIAGNOSIS — Z7902 Long term (current) use of antithrombotics/antiplatelets: Secondary | ICD-10-CM | POA: Diagnosis not present

## 2017-07-27 DIAGNOSIS — K8010 Calculus of gallbladder with chronic cholecystitis without obstruction: Secondary | ICD-10-CM | POA: Diagnosis not present

## 2017-07-27 DIAGNOSIS — Z79899 Other long term (current) drug therapy: Secondary | ICD-10-CM | POA: Diagnosis not present

## 2017-07-27 DIAGNOSIS — Z888 Allergy status to other drugs, medicaments and biological substances status: Secondary | ICD-10-CM | POA: Diagnosis not present

## 2017-07-27 DIAGNOSIS — R197 Diarrhea, unspecified: Secondary | ICD-10-CM | POA: Diagnosis not present

## 2017-07-27 DIAGNOSIS — M1990 Unspecified osteoarthritis, unspecified site: Secondary | ICD-10-CM | POA: Diagnosis not present

## 2017-07-27 DIAGNOSIS — I252 Old myocardial infarction: Secondary | ICD-10-CM | POA: Diagnosis not present

## 2017-07-27 DIAGNOSIS — Z8249 Family history of ischemic heart disease and other diseases of the circulatory system: Secondary | ICD-10-CM | POA: Diagnosis not present

## 2017-07-27 DIAGNOSIS — F419 Anxiety disorder, unspecified: Secondary | ICD-10-CM | POA: Diagnosis not present

## 2017-07-27 LAB — CBC WITH DIFFERENTIAL/PLATELET
BASOS PCT: 0 %
Basophils Absolute: 0 10*3/uL (ref 0.0–0.1)
EOS ABS: 0.4 10*3/uL (ref 0.0–0.7)
Eosinophils Relative: 4 %
HCT: 40.9 % (ref 36.0–46.0)
HEMOGLOBIN: 13.4 g/dL (ref 12.0–15.0)
Lymphocytes Relative: 27 %
Lymphs Abs: 2.5 10*3/uL (ref 0.7–4.0)
MCH: 28.4 pg (ref 26.0–34.0)
MCHC: 32.8 g/dL (ref 30.0–36.0)
MCV: 86.7 fL (ref 78.0–100.0)
Monocytes Absolute: 0.4 10*3/uL (ref 0.1–1.0)
Monocytes Relative: 4 %
NEUTROS PCT: 65 %
Neutro Abs: 6.1 10*3/uL (ref 1.7–7.7)
Platelets: 213 10*3/uL (ref 150–400)
RBC: 4.72 MIL/uL (ref 3.87–5.11)
RDW: 14.1 % (ref 11.5–15.5)
WBC: 9.4 10*3/uL (ref 4.0–10.5)

## 2017-07-27 LAB — COMPREHENSIVE METABOLIC PANEL
ALBUMIN: 3.7 g/dL (ref 3.5–5.0)
ALK PHOS: 86 U/L (ref 38–126)
ALT: 13 U/L — AB (ref 14–54)
ANION GAP: 12 (ref 5–15)
AST: 15 U/L (ref 15–41)
BUN: 10 mg/dL (ref 6–20)
CALCIUM: 9.4 mg/dL (ref 8.9–10.3)
CHLORIDE: 108 mmol/L (ref 101–111)
CO2: 20 mmol/L — AB (ref 22–32)
CREATININE: 0.7 mg/dL (ref 0.44–1.00)
GFR calc Af Amer: >60 mL/min (ref >60)
GFR calc non Af Amer: >60 mL/min (ref >60)
GLUCOSE: 158 mg/dL — AB (ref 65–99)
Potassium: 4.5 mmol/L (ref 3.5–5.1)
SODIUM: 140 mmol/L (ref 135–145)
Total Bilirubin: 0.3 mg/dL (ref 0.3–1.2)
Total Protein: 6.4 g/dL — ABNORMAL LOW (ref 6.5–8.1)

## 2017-07-27 NOTE — Progress Notes (Signed)
PCP: Dr. Dorinda Hill saw 3 months ago Cardiologist: Dr. Stanford Breed.  Pt reports she saw Dr. Einar Gip "years ago"  EKG: Today CXR: Denies ECHO: 2018 Stress Test: 2018 Cardiac Cath: 2008 Carotid Dopplers: Pt reports done at Dr. Irven Shelling office--possibly in 2006--requested records  Pt reports she had a thyroid biopsy on 07/25/2017 and stopped Plavix prior to that procedure on 07/18/2017  ERAS protocol-Pt provided with pre-surgery ensure, verbalized understanding of when to consume.  Instructed no smoking starting today.  Patient denies shortness of breath, fever, cough, and chest pain at PAT appointment.  Patient verbalized understanding of instructions provided today at the PAT appointment.  Patient asked to review instructions at home and day of surgery.

## 2017-07-27 NOTE — Progress Notes (Addendum)
Anesthesia chart review: Patient is a 57 year old female scheduled for cholecystectomy on 07/28/2017 by Dr. Autumn Messing.  History includes smoking, PFO (medical therapy), CAD/MI (BMS OM1 10/16/06, [facility] Dr. Josefa Half), HTN, pre-diabetes, hypertension, hyperlipidemia, anxiety, depression, psoriasis, thyroid nodule (s/p biopsy 07/25/17), prior narcotic abuse (denied), right MCA CVA 01/2011, right eye blindness,   - PCP is listed as Dr. Lyndal Pulley, but by PAT RN notes, PCP is Dr. Charna Archer at [facility]. - Cardiologist is Dr. Kirk Ruths.  Last visit 07/27/16. Stress test was ordered. No CHF symptoms. Smoking cessation encouraged. Given duration since CVA, he did not recommend referral for PFO closure. Per 07/12/17 telephone encounter by Kerin Ransom, PA "Given past medical history and time since last visit, based on ACC/AHA guidelines, Sandra [name] would be at acceptable risk for the planned procedure without further cardiovascular testing. OK to hold Plavix 5 days pre op." (She reported also seeing Dr. Adrian Prows in the past with carotid dopplers ~ 2006. Note from 05/03/11 indicates he reviewed her hospital TEE which showed potentially a very small PFO.  Study was not very optimal to evaluate for PFO closure.  However he recommended continue medical therapy.)   Meds include Plavix (last dose 07/18/17), allopurinol, amlodipine, atropine ophthalmic, clonidine, Bentyl, Norco, Ativan, losartan, Lialda, Lopressor, Prilosec, KCl, Pred forte ophthalmic, Crestor, Aldactone.  BP (!) 155/55   Pulse 65   Temp 36.7 C   Resp 20   Ht 5\' 5"  (1.651 m)   Wt 172 lb 3.2 oz (78.1 kg)   SpO2 100%   BMI 28.66 kg/m   EKG 07/27/17: Normal sinus rhythm, possible left atrial enlargement.  Nuclear stress test 09/09/16:  Nuclear stress EF: 52%.  There was no ST segment deviation noted during stress.  The study is normal.  This is a low risk study.  Normal pharmacologic nuclear stress test with no evidence for  prior infarct or ischemia.   Echo 06/29/16: Study Conclusions - Left ventricle: The cavity size was normal. Wall thickness was   increased in a pattern of moderate LVH. Systolic function was   vigorous. The estimated ejection fraction was in the range of 65%   to 70%. Wall motion was normal; there were no regional wall   motion abnormalities. Features are consistent with a pseudonormal   left ventricular filling pattern, with concomitant abnormal   relaxation and increased filling pressure (grade 2 diastolic   dysfunction). - Aortic valve: Valve area (VTI): 1.24 cm^2. Valve area (Vmax):   1.44 cm^2. Valve area (Vmean): 1.39 cm^2.  Cardiac cath 10/16/06 [facility]):  Mid LAD: 20% stenosis. OM 1: 100% stenosis. RPDA: 60% stenosis. PCI: Vision 3.0 X 18 mm BMS of the 100% stenosis in OM1.  0% residual stenosis. Summary: Significant 1 vessel CAD. Successful OM1 PCI.  EF calculated by contrast ventriculography was 55%.  Normal abdominal aorta.  CT Chest (lung cancer screen) 09/28/16: IMPRESSION: 1. Lung-RADS Category 2-S, benign appearance or behavior. Continue annual screening with low-dose chest CT without contrast in 12 months. 2. The "S" modifier above refers to potentially clinically significant non lung cancer related findings. Specifically, three-vessel coronary atherosclerosis. 3. Calcified 1.4 cm nodule in the thyroid isthmus. Recommend correlation with thyroid ultrasound if not previously performed. 4. Aortic atherosclerosis. 5. Mild emphysema with mild diffuse bronchial wall thickening, suggesting COPD. 6. Partially visualized chronic L1 vertebral compression fracture.  Preoperative labs noted. Cr. 0.70. Glucose 158. CBC WNL. A1c 6.8 on 06/16/17.  If no acute changes and anticipate that she  can proceed as planned.  [facility] Short Stay Center/Anesthesiology Phone [phone] 07/27/2017 3:37 PM

## 2017-07-27 NOTE — Pre-Procedure Instructions (Signed)
Sandra [name]  07/27/2017      [address] [place], [place] [address] Phone: [phone] Fax: [phone]    Your procedure is scheduled on July 28, 2017.  Report to [facility] Admitting at 08:30 A.M.  Call this number if you have problems the morning of surgery:  [phone]   Remember:   Do not eat food or drink liquids after midnight.  **Please complete your PRE-SURGERY ENSURE that was provided before you leave your house the morning of surgery.  Please, if able, drink it in one setting. DO NOT SIP.**   Take these medicines the morning of surgery with A SIP OF WATER :  Allopurinol (Zyloprim) Amlodipine (Norvasc) Eye drop Clonidine (Catapres) Metoprolol (Lopressor) Pain pill if needed  Follow your Doctor's instructions regarding your Plavix  STOP taking any Aspirin (unless otherwise instructed by your surgeon), Aleve, Naproxen, Ibuprofen, Motrin, Advil, Goody's, BC's, all herbal medications, fish oil, and all vitamins.   Do not wear jewelry, make-up or nail polish.  Do not wear lotions, powders, or perfumes, or deodorant.  Do not shave 48 hours prior to surgery.   Do not bring valuables to the hospital.   [facility] is not responsible for any belongings or valuables.  Contacts, dentures or bridgework may not be worn into surgery.  Leave your suitcase in the car.  After surgery it may be brought to your room.  For patients admitted to the hospital, discharge time will be determined by your treatment team.  Patients discharged the day of surgery will not be allowed to drive home.   Special instructions:   [place]- Preparing For Surgery  Before surgery, you can play an important role. Because skin is not sterile, your skin needs to be as free of germs as possible. You can reduce the number of germs on your skin by washing with CHG (chlorahexidine gluconate) Soap before  surgery.  CHG is an antiseptic cleaner which kills germs and bonds with the skin to continue killing germs even after washing.  Please do not use if you have an allergy to CHG or antibacterial soaps. If your skin becomes reddened/irritated stop using the CHG.  Do not shave (including legs and underarms) for at least 48 hours prior to first CHG shower. It is OK to shave your face.  Please follow these instructions carefully.   1. Shower the NIGHT BEFORE SURGERY and the MORNING OF SURGERY with CHG.   2. If you chose to wash your hair, wash your hair first as usual with your normal shampoo.  3. After you shampoo, rinse your hair and body thoroughly to remove the shampoo.  4. Use CHG as you would any other liquid soap. You can apply CHG directly to the skin and wash gently with a scrungie or a clean washcloth.   5. Apply the CHG Soap to your body ONLY FROM THE NECK DOWN.  Do not use on open wounds or open sores. Avoid contact with your eyes, ears, mouth and genitals (private parts). Wash Face and genitals (private parts)  with your normal soap.  6. Wash thoroughly, paying special attention to the area where your surgery will be performed.  7. Thoroughly rinse your body with warm water from the neck down.  8. DO NOT shower/wash with your normal soap after using and rinsing off the CHG Soap.  9. Pat yourself dry with a CLEAN TOWEL.  10. Wear CLEAN PAJAMAS to bed the night before surgery, wear comfortable clothes the morning of surgery  11. Place CLEAN SHEETS on your bed the night of your first shower and DO NOT SLEEP WITH PETS.    Day of Surgery: Do not apply any deodorants/lotions. Please wear clean clothes to the hospital/surgery center.      Please read over the following fact sheets that you were given.

## 2017-07-28 ENCOUNTER — Ambulatory Visit (HOSPITAL_COMMUNITY): Payer: Medicare HMO

## 2017-07-28 ENCOUNTER — Ambulatory Visit (HOSPITAL_COMMUNITY): Payer: Medicare HMO | Admitting: Vascular Surgery

## 2017-07-28 ENCOUNTER — Encounter (HOSPITAL_COMMUNITY): Payer: Self-pay | Admitting: Certified Registered Nurse Anesthetist

## 2017-07-28 ENCOUNTER — Ambulatory Visit (HOSPITAL_COMMUNITY): Payer: Medicare HMO | Admitting: Anesthesiology

## 2017-07-28 ENCOUNTER — Ambulatory Visit (HOSPITAL_COMMUNITY)
Admission: RE | Admit: 2017-07-28 | Discharge: 2017-07-28 | Disposition: A | Discharge status: Home / Self Care | Payer: Medicare HMO | Source: Ambulatory Visit | Attending: General Surgery | Admitting: General Surgery

## 2017-07-28 ENCOUNTER — Encounter (HOSPITAL_COMMUNITY)
Admission: RE | Disposition: A | Discharge status: Home / Self Care | Payer: Self-pay | Source: Ambulatory Visit | Attending: General Surgery

## 2017-07-28 DIAGNOSIS — H5440 Blindness, one eye, unspecified eye: Secondary | ICD-10-CM | POA: Diagnosis not present

## 2017-07-28 DIAGNOSIS — I69354 Hemiplegia and hemiparesis following cerebral infarction affecting left non-dominant side: Secondary | ICD-10-CM | POA: Insufficient documentation

## 2017-07-28 DIAGNOSIS — I10 Essential (primary) hypertension: Secondary | ICD-10-CM | POA: Insufficient documentation

## 2017-07-28 DIAGNOSIS — Z87891 Personal history of nicotine dependence: Secondary | ICD-10-CM | POA: Insufficient documentation

## 2017-07-28 DIAGNOSIS — I2510 Atherosclerotic heart disease of native coronary artery without angina pectoris: Secondary | ICD-10-CM | POA: Diagnosis not present

## 2017-07-28 DIAGNOSIS — M1990 Unspecified osteoarthritis, unspecified site: Secondary | ICD-10-CM | POA: Insufficient documentation

## 2017-07-28 DIAGNOSIS — K8010 Calculus of gallbladder with chronic cholecystitis without obstruction: Secondary | ICD-10-CM | POA: Diagnosis not present

## 2017-07-28 DIAGNOSIS — I739 Peripheral vascular disease, unspecified: Secondary | ICD-10-CM | POA: Insufficient documentation

## 2017-07-28 DIAGNOSIS — Z7902 Long term (current) use of antithrombotics/antiplatelets: Secondary | ICD-10-CM | POA: Insufficient documentation

## 2017-07-28 DIAGNOSIS — F419 Anxiety disorder, unspecified: Secondary | ICD-10-CM | POA: Insufficient documentation

## 2017-07-28 DIAGNOSIS — K838 Other specified diseases of biliary tract: Secondary | ICD-10-CM | POA: Diagnosis not present

## 2017-07-28 DIAGNOSIS — R197 Diarrhea, unspecified: Secondary | ICD-10-CM | POA: Diagnosis not present

## 2017-07-28 DIAGNOSIS — E785 Hyperlipidemia, unspecified: Secondary | ICD-10-CM | POA: Diagnosis not present

## 2017-07-28 DIAGNOSIS — I252 Old myocardial infarction: Secondary | ICD-10-CM | POA: Insufficient documentation

## 2017-07-28 DIAGNOSIS — Z955 Presence of coronary angioplasty implant and graft: Secondary | ICD-10-CM | POA: Insufficient documentation

## 2017-07-28 DIAGNOSIS — Z79899 Other long term (current) drug therapy: Secondary | ICD-10-CM | POA: Insufficient documentation

## 2017-07-28 DIAGNOSIS — Z8249 Family history of ischemic heart disease and other diseases of the circulatory system: Secondary | ICD-10-CM | POA: Insufficient documentation

## 2017-07-28 DIAGNOSIS — F329 Major depressive disorder, single episode, unspecified: Secondary | ICD-10-CM | POA: Insufficient documentation

## 2017-07-28 DIAGNOSIS — Z888 Allergy status to other drugs, medicaments and biological substances status: Secondary | ICD-10-CM | POA: Insufficient documentation

## 2017-07-28 DIAGNOSIS — Z419 Encounter for procedure for purposes other than remedying health state, unspecified: Secondary | ICD-10-CM

## 2017-07-28 DIAGNOSIS — K8020 Calculus of gallbladder without cholecystitis without obstruction: Secondary | ICD-10-CM | POA: Diagnosis not present

## 2017-07-28 HISTORY — PX: CHOLECYSTECTOMY: SHX55

## 2017-07-28 SURGERY — LAPAROSCOPIC CHOLECYSTECTOMY WITH INTRAOPERATIVE CHOLANGIOGRAM
Anesthesia: General | Site: Abdomen

## 2017-07-28 MED ORDER — CHLORHEXIDINE GLUCONATE CLOTH 2 % EX PADS: 6.0000 | MEDICATED_PAD | Freq: Once | CUTANEOUS | Status: DC

## 2017-07-28 MED ORDER — ACETAMINOPHEN 500 MG PO TABS
1000.0000 mg | ORAL_TABLET | ORAL | Status: AC
  Administered 2017-07-28: 1000 mg via ORAL
  Filled 2017-07-28: qty 2

## 2017-07-28 MED ORDER — DEXAMETHASONE SODIUM PHOSPHATE 10 MG/ML IJ SOLN
INTRAMUSCULAR | Status: AC
  Filled 2017-07-28: qty 1

## 2017-07-28 MED ORDER — 0.9 % SODIUM CHLORIDE (POUR BTL) OPTIME
TOPICAL | Status: DC | PRN
Start: 2017-07-28 — End: 2017-07-28
  Administered 2017-07-28: 1000 mL

## 2017-07-28 MED ORDER — BUPIVACAINE-EPINEPHRINE 0.25% -1:200000 IJ SOLN
INTRAMUSCULAR | Status: AC
  Filled 2017-07-28: qty 1

## 2017-07-28 MED ORDER — ROCURONIUM BROMIDE 10 MG/ML (PF) SYRINGE
PREFILLED_SYRINGE | INTRAVENOUS | Status: AC
  Filled 2017-07-28: qty 5

## 2017-07-28 MED ORDER — LACTATED RINGERS IV SOLN
INTRAVENOUS | Status: DC
  Administered 2017-07-28: 10:00:00 via INTRAVENOUS

## 2017-07-28 MED ORDER — ONDANSETRON HCL 4 MG/2ML IJ SOLN
INTRAMUSCULAR | Status: AC
  Filled 2017-07-28: qty 2

## 2017-07-28 MED ORDER — FENTANYL CITRATE (PF) 250 MCG/5ML IJ SOLN
INTRAMUSCULAR | Status: AC
Start: 2017-07-28 — End: 2017-07-28
  Filled 2017-07-28: qty 5

## 2017-07-28 MED ORDER — CELECOXIB 200 MG PO CAPS
200.0000 mg | ORAL_CAPSULE | ORAL | Status: AC
  Administered 2017-07-28: 200 mg via ORAL
  Filled 2017-07-28: qty 1

## 2017-07-28 MED ORDER — LIDOCAINE 2% (20 MG/ML) 5 ML SYRINGE
INTRAMUSCULAR | Status: AC
  Filled 2017-07-28: qty 10

## 2017-07-28 MED ORDER — SODIUM CHLORIDE 0.9 % IR SOLN
Status: DC | PRN
  Administered 2017-07-28: 1000 mL

## 2017-07-28 MED ORDER — FENTANYL CITRATE (PF) 100 MCG/2ML IJ SOLN
INTRAMUSCULAR | Status: DC | PRN
  Administered 2017-07-28: 100 ug via INTRAVENOUS
  Administered 2017-07-28 (×3): 50 ug via INTRAVENOUS

## 2017-07-28 MED ORDER — FENTANYL CITRATE (PF) 250 MCG/5ML IJ SOLN
INTRAMUSCULAR | Status: AC
  Filled 2017-07-28: qty 5

## 2017-07-28 MED ORDER — HEMOSTATIC AGENTS (NO CHARGE) OPTIME
TOPICAL | Status: DC | PRN
  Administered 2017-07-28: 1 via TOPICAL

## 2017-07-28 MED ORDER — SUGAMMADEX SODIUM 200 MG/2ML IV SOLN
INTRAVENOUS | Status: AC
  Filled 2017-07-28: qty 2

## 2017-07-28 MED ORDER — LIDOCAINE HCL (CARDIAC) 20 MG/ML IV SOLN
INTRAVENOUS | Status: DC | PRN
  Administered 2017-07-28: 40 mg via INTRAVENOUS

## 2017-07-28 MED ORDER — SODIUM CHLORIDE 0.9 % IV SOLN
INTRAVENOUS | Status: DC | PRN
  Administered 2017-07-28: 10 mL

## 2017-07-28 MED ORDER — IOPAMIDOL (ISOVUE-300) INJECTION 61%
INTRAVENOUS | Status: AC
  Filled 2017-07-28: qty 50

## 2017-07-28 MED ORDER — GABAPENTIN 300 MG PO CAPS
300.0000 mg | ORAL_CAPSULE | ORAL | Status: AC
  Administered 2017-07-28: 300 mg via ORAL
  Filled 2017-07-28: qty 1

## 2017-07-28 MED ORDER — HYDROCODONE-ACETAMINOPHEN 5-325 MG PO TABS: 1.0000 | ORAL_TABLET | Freq: Four times a day (QID) | ORAL | 0 refills | Status: DC | PRN

## 2017-07-28 MED ORDER — PROPOFOL 10 MG/ML IV BOLUS
INTRAVENOUS | Status: AC
Start: 2017-07-28 — End: 2017-07-28
  Filled 2017-07-28: qty 20

## 2017-07-28 MED ORDER — DEXAMETHASONE SODIUM PHOSPHATE 10 MG/ML IJ SOLN
INTRAMUSCULAR | Status: DC | PRN
  Administered 2017-07-28: 10 mg via INTRAVENOUS

## 2017-07-28 MED ORDER — HYDROMORPHONE HCL 1 MG/ML IJ SOLN
INTRAMUSCULAR | Status: AC
  Administered 2017-07-28: 0.5 mg via INTRAVENOUS
  Filled 2017-07-28: qty 1

## 2017-07-28 MED ORDER — PROPOFOL 10 MG/ML IV BOLUS
INTRAVENOUS | Status: AC
  Filled 2017-07-28: qty 20

## 2017-07-28 MED ORDER — BUPIVACAINE-EPINEPHRINE 0.25% -1:200000 IJ SOLN
INTRAMUSCULAR | Status: DC | PRN
  Administered 2017-07-28: 21 mL

## 2017-07-28 MED ORDER — MIDAZOLAM HCL 5 MG/5ML IJ SOLN
INTRAMUSCULAR | Status: DC | PRN
  Administered 2017-07-28: 2 mg via INTRAVENOUS

## 2017-07-28 MED ORDER — CEFAZOLIN SODIUM-DEXTROSE 2-4 GM/100ML-% IV SOLN
2.0000 g | INTRAVENOUS | Status: AC
  Administered 2017-07-28: 2 g via INTRAVENOUS
  Filled 2017-07-28: qty 100

## 2017-07-28 MED ORDER — HYDROMORPHONE HCL 1 MG/ML IJ SOLN
0.2500 mg | INTRAMUSCULAR | Status: DC | PRN
  Administered 2017-07-28 (×4): 0.5 mg via INTRAVENOUS

## 2017-07-28 MED ORDER — PROPOFOL 10 MG/ML IV BOLUS
INTRAVENOUS | Status: DC | PRN
  Administered 2017-07-28: 120 mg via INTRAVENOUS

## 2017-07-28 MED ORDER — ROCURONIUM BROMIDE 100 MG/10ML IV SOLN
INTRAVENOUS | Status: DC | PRN
  Administered 2017-07-28: 40 mg via INTRAVENOUS

## 2017-07-28 MED ORDER — MIDAZOLAM HCL 2 MG/2ML IJ SOLN
INTRAMUSCULAR | Status: AC
  Filled 2017-07-28: qty 2

## 2017-07-28 MED ORDER — LIDOCAINE IN D5W 4-5 MG/ML-% IV SOLN
1.0000 mg/min | INTRAVENOUS | Status: AC
Start: 2017-07-28 — End: 2017-07-28
  Administered 2017-07-28: 1.45 mg/min via INTRAVENOUS
  Filled 2017-07-28: qty 500

## 2017-07-28 MED ORDER — SUGAMMADEX SODIUM 200 MG/2ML IV SOLN
INTRAVENOUS | Status: DC | PRN
  Administered 2017-07-28: 200 mg via INTRAVENOUS

## 2017-07-28 SURGICAL SUPPLY — 37 items
APPLIER CLIP 5 13 M/L LIGAMAX5 (MISCELLANEOUS) ×3
BLADE CLIPPER SURG (BLADE) IMPLANT
CANISTER SUCT 3000ML PPV (MISCELLANEOUS) ×3 IMPLANT
CATH REDDICK CHOLANGI 4FR 50CM (CATHETERS) ×3 IMPLANT
CHLORAPREP W/TINT 26ML (MISCELLANEOUS) ×3 IMPLANT
CLIP APPLIE 5 13 M/L LIGAMAX5 (MISCELLANEOUS) ×1 IMPLANT
COVER MAYO STAND STRL (DRAPES) ×3 IMPLANT
COVER SURGICAL LIGHT HANDLE (MISCELLANEOUS) ×3 IMPLANT
DERMABOND ADVANCED (GAUZE/BANDAGES/DRESSINGS) ×2
DERMABOND ADVANCED .7 DNX12 (GAUZE/BANDAGES/DRESSINGS) ×1 IMPLANT
DRAPE C-ARM 42X72 X-RAY (DRAPES) ×3 IMPLANT
ELECT REM PT RETURN 9FT ADLT (ELECTROSURGICAL) ×3
ELECTRODE REM PT RTRN 9FT ADLT (ELECTROSURGICAL) ×1 IMPLANT
GLOVE BIO SURGEON STRL SZ7.5 (GLOVE) ×3 IMPLANT
GLOVE ECLIPSE 7.5 STRL STRAW (GLOVE) ×3 IMPLANT
GOWN STRL REUS W/ TWL LRG LVL3 (GOWN DISPOSABLE) ×3 IMPLANT
GOWN STRL REUS W/TWL LRG LVL3 (GOWN DISPOSABLE) ×6
HEMOSTAT SNOW SURGICEL 2X4 (HEMOSTASIS) ×3 IMPLANT
IV CATH 14GX2 1/4 (CATHETERS) ×3 IMPLANT
KIT BASIN OR (CUSTOM PROCEDURE TRAY) ×3 IMPLANT
KIT ROOM TURNOVER OR (KITS) ×3 IMPLANT
KIT TUBE JEJUNAL 16FR (CATHETERS) ×3 IMPLANT
NS IRRIG 1000ML POUR BTL (IV SOLUTION) ×3 IMPLANT
PAD ARMBOARD 7.5X6 YLW CONV (MISCELLANEOUS) ×3 IMPLANT
POUCH SPECIMEN RETRIEVAL 10MM (ENDOMECHANICALS) ×3 IMPLANT
SCISSORS LAP 5X35 DISP (ENDOMECHANICALS) ×3 IMPLANT
SET IRRIG TUBING LAPAROSCOPIC (IRRIGATION / IRRIGATOR) ×3 IMPLANT
SLEEVE ENDOPATH XCEL 5M (ENDOMECHANICALS) ×6 IMPLANT
SPECIMEN JAR SMALL (MISCELLANEOUS) ×3 IMPLANT
SUT MNCRL AB 4-0 PS2 18 (SUTURE) ×3 IMPLANT
TOWEL OR 17X24 6PK STRL BLUE (TOWEL DISPOSABLE) ×3 IMPLANT
TOWEL OR 17X26 10 PK STRL BLUE (TOWEL DISPOSABLE) ×3 IMPLANT
TRAY LAPAROSCOPIC MC (CUSTOM PROCEDURE TRAY) ×3 IMPLANT
TROCAR XCEL BLUNT TIP 100MML (ENDOMECHANICALS) ×3 IMPLANT
TROCAR XCEL NON-BLD 5MMX100MML (ENDOMECHANICALS) ×3 IMPLANT
TUBING INSUFFLATION (TUBING) ×3 IMPLANT
WATER STERILE IRR 1000ML POUR (IV SOLUTION) ×3 IMPLANT

## 2017-07-28 NOTE — Anesthesia Procedure Notes (Signed)
Procedure Name: Intubation Date/Time: 07/28/2017 11:31 AM Performed by: Moshe Salisbury, CRNA Pre-anesthesia Checklist: Patient identified, Emergency Drugs available, Suction available and Patient being monitored Patient Re-evaluated:Patient Re-evaluated prior to induction Oxygen Delivery Method: Circle System Utilized Preoxygenation: Pre-oxygenation with 100% oxygen Induction Type: IV induction Ventilation: Mask ventilation without difficulty Laryngoscope Size: Mac and 3 Grade View: Grade I Tube type: Oral Tube size: 7.5 mm Number of attempts: 1 Airway Equipment and Method: Stylet Placement Confirmation: ETT inserted through vocal cords under direct vision,  positive ETCO2 and breath sounds checked- equal and bilateral Secured at: 20 cm Tube secured with: Tape Dental Injury: Teeth and Oropharynx as per pre-operative assessment

## 2017-07-28 NOTE — H&P (Signed)
Sandra [name]  Location: [facility] Patient #: [id number] DOB: Aug 24, 1960 Married / Language: English / Race: White Female   History of Present Illness  The patient is a 57 year old female who presents with abdominal pain. We are asked to see the patient in consultation by Dr. Holley Raring to evaluate her for gallstones. The patient is a 57 year old white female who has been experiencing right upper quadrant pain for the last 11 months. The pain seems to come and go. The pain has been associated with nausea and vomiting at times. She claims to have no appetite. She also states that she has had diarrhea for several months. She had an ultrasound that did show an 8 mm gallstone but no gallbladder wall thickening or ductal dilatation. Her liver functions were normal. She does have a history of myocardial infarction 18 years ago and stroke 10 years ago which has left her left side weak. She is on Plavix for this. She is followed by Dr. Stanford Breed   Diagnostic Studies History  Colonoscopy  within last year Mammogram  within last year Pap Smear  1-5 years ago  Allergies  Azor *ANTIHYPERTENSIVES*  Trintellix *ANTIDEPRESSANTS*   Medication History  Allopurinol (300MG Tablet, Oral) Active. AmLODIPine Besylate (5MG Tablet, Oral) Active. CloNIDine HCl (0.2MG Tablet, Oral) Active. Clopidogrel Bisulfate (75MG Tablet, Oral) Active. Dicyclomine HCl (20MG Tablet, Oral) Active. Losartan Potassium (100MG Tablet, Oral) Active. Losartan Potassium (50MG Tablet, Oral) Active. Metoprolol Tartrate (100MG Tablet, Oral) Active. Omeprazole (20MG Capsule DR, Oral) Active. Promethazine HCl (12.5MG Tablet, Oral) Active. Rosuvastatin Calcium (40MG Tablet, Oral) Active. Spironolactone (25MG Tablet, Oral) Active. LORazepam (1MG Tablet, Oral) Active. Potassimin (Oral) Specific strength unknown - Active. TiZANidine HCl (4MG Capsule, Oral) Active. Eszopiclone (2MG Tablet, Oral)  Active. Medications Reconciled  Social History  Tobacco use  Former smoker.  Family History Cerebrovascular Accident  Brother, Father. Heart disease in female family member before age 38  Ovarian Cancer  Sister.  Pregnancy / Birth History Maternal age  37-25  Other Problems Anxiety Disorder  Arthritis  Bladder Problems  Cerebrovascular Accident  Depression  High blood pressure  Myocardial infarction     Review of Systems  General Not Present- Appetite Loss, Chills, Fatigue, Fever, Night Sweats, Weight Gain and Weight Loss. Note: All other systems negative (unless as noted in HPI & included Review of Systems) Skin Not Present- Change in Wart/Mole, Dryness, Hives, Jaundice, New Lesions, Non-Healing Wounds, Rash and Ulcer. HEENT Not Present- Earache, Hearing Loss, Hoarseness, Nose Bleed, Oral Ulcers, Ringing in the Ears, Seasonal Allergies, Sinus Pain, Sore Throat, Visual Disturbances, Wears glasses/contact lenses and Yellow Eyes. Respiratory Not Present- Bloody sputum, Chronic Cough, Difficulty Breathing, Snoring and Wheezing. Breast Not Present- Breast Mass, Breast Pain, Nipple Discharge and Skin Changes. Cardiovascular Not Present- Chest Pain, Difficulty Breathing Lying Down, Leg Cramps, Palpitations, Rapid Heart Rate, Shortness of Breath and Swelling of Extremities. Gastrointestinal Not Present- Abdominal Pain, Bloating, Bloody Stool, Change in Bowel Habits, Chronic diarrhea, Constipation, Difficulty Swallowing, Excessive gas, Gets full quickly at meals, Hemorrhoids, Indigestion, Nausea, Rectal Pain and Vomiting. Female Genitourinary Not Present- Frequency, Nocturia, Painful Urination, Pelvic Pain and Urgency. Musculoskeletal Not Present- Back Pain, Joint Pain, Joint Stiffness, Muscle Pain, Muscle Weakness and Swelling of Extremities. Neurological Not Present- Decreased Memory, Fainting, Headaches, Numbness, Seizures, Tingling, Tremor, Trouble walking and  Weakness. Psychiatric Not Present- Anxiety, Bipolar, Change in Sleep Pattern, Depression, Fearful and Frequent crying. Endocrine Not Present- Cold Intolerance, Excessive Hunger, Hair Changes, Heat Intolerance, Hot flashes and New Diabetes. Hematology  Present- Blood Thinners. Not Present- Easy Bruising, Excessive bleeding, Gland problems, HIV and Persistent Infections.  Vitals  Weight: 174.5 lb Height: 65in Body Surface Area: 1.87 m Body Mass Index: 29.04 kg/m  BP: 162/84 (Sitting, Left Arm, Standard)       Physical Exam General Mental Status-Alert. General Appearance-Consistent with stated age. Hydration-Well hydrated. Voice-Normal.  Head and Neck Head-normocephalic, atraumatic with no lesions or palpable masses. Trachea-midline. Thyroid Gland Characteristics - normal size and consistency. Note: She is blind in her right eye from a previous MRSA infection   Eye Eyeball - Bilateral-Extraocular movements intact. Sclera/Conjunctiva - Bilateral-No scleral icterus.  Chest and Lung Exam Chest and lung exam reveals -quiet, even and easy respiratory effort with no use of accessory muscles and on auscultation, normal breath sounds, no adventitious sounds and normal vocal resonance. Inspection Chest Wall - Normal. Back - normal.  Cardiovascular Cardiovascular examination reveals -normal heart sounds, regular rate and rhythm with no murmurs and normal pedal pulses bilaterally.  Abdomen Note: The abdomen is soft with some mild epigastric and right upper quadrant tenderness. There is no palpable mass.   Neurologic Neurologic evaluation reveals -alert and oriented x 3 with no impairment of recent or remote memory. Mental Status-Normal.  Musculoskeletal Normal Exam - Left-Upper Extremity Strength Normal and Lower Extremity Strength Normal. Normal Exam - Right-Upper Extremity Strength Normal and Lower Extremity Strength Normal. Note: She  has left-sided weakness from a previous stroke   Lymphatic Head & Neck  General Head & Neck Lymphatics: Bilateral - Description - Normal. Axillary  General Axillary Region: Bilateral - Description - Normal. Tenderness - Non Tender. Femoral & Inguinal  Generalized Femoral & Inguinal Lymphatics: Bilateral - Description - Normal. Tenderness - Non Tender.    Assessment & Plan  GALLSTONES (K80.20) Impression: The patient appears to have a symptomatic gallstone. Because of the risks of further painful episodes and possible pancreatitis and think she would benefit from having the gallbladder removed. Given her history of heart attack and stroke and the fact that she is on blood thinners and think she is at some significant risk of a complication from the surgery. Because of this I will plan to get cardiac clearance from Dr. Stanford Breed. I have discussed with her in detail the risks and benefits of the operation as well as some the technical aspects. She would like to think about it before making her final decision.

## 2017-07-28 NOTE — Op Note (Signed)
07/28/2017  12:20 PM  PATIENT:  Sandra [name]  57 y.o. female  PRE-OPERATIVE DIAGNOSIS:  Gallstones  POST-OPERATIVE DIAGNOSIS:  Gallstones  PROCEDURE:  Procedure(s): LAPAROSCOPIC CHOLECYSTECTOMY WITH INTRAOPERATIVE CHOLANGIOGRAM (N/A)  SURGEON:  Surgeon(s) and Role:    * Jovita Kussmaul, MD - Primary  PHYSICIAN ASSISTANT:   ASSISTANTS: Judyann Munson, RNFA   ANESTHESIA:   local and general  EBL:  minimal   BLOOD ADMINISTERED:none  DRAINS: none   LOCAL MEDICATIONS USED:  MARCAINE     SPECIMEN:  Source of Specimen:  gallbladder  DISPOSITION OF SPECIMEN:  PATHOLOGY  COUNTS:  YES  TOURNIQUET:  * No tourniquets in log *  DICTATION: .Dragon Dictation   Procedure: After informed consent was obtained the patient was brought to the operating room and placed in the supine position on the operating room table. After adequate induction of general anesthesia the patient's abdomen was prepped with ChloraPrep allowed to dry and draped in usual sterile manner. An appropriate timeout was performed. The area below the umbilicus was infiltrated with quarter percent  Marcaine. A small incision was made with a 15 blade knife. The incision was carried down through the subcutaneous tissue bluntly with a hemostat and Army-Nav[employer] retractors. The linea alba was identified. The linea alba was incised with a 15 blade knife and each side was grasped with Coker clamps. The preperitoneal space was then probed with a hemostat until the peritoneum was opened and access was gained to the abdominal cavity. A 0 Vicryl pursestring stitch was placed in the fascia surrounding the opening. A Hassan cannula was then placed through the opening and anchored in place with the previously placed Vicryl purse string stitch. The abdomen was insufflated with carbon dioxide without difficulty. A laparoscope was inserted through the [facility] cannula in the right upper quadrant was inspected. Next the epigastric region was infiltrated  with % Marcaine. A small incision was made with a 15 blade knife. A 5 mm port was placed bluntly through this incision into the abdominal cavity under direct vision. Next 2 sites were chosen laterally on the right side of the abdomen for placement of 5 mm ports. Each of these areas was infiltrated with quarter percent Marcaine. Small stab incisions were made with a 15 blade knife. 5 mm ports were then placed bluntly through these incisions into the abdominal cavity under direct vision without difficulty. A blunt grasper was placed through the lateralmost 5 mm port and used to grasp the dome of the gallbladder and elevated anteriorly and superiorly. Another blunt grasper was placed through the other 5 mm port and used to retract the body and neck of the gallbladder. A dissector was placed through the epigastric port and using the electrocautery the peritoneal reflection at the gallbladder neck was opened. Blunt dissection was then carried out in this area until the gallbladder neck-cystic duct junction was readily identified and a good window was created. A single clip was placed on the gallbladder neck. A small  ductotomy was made just below the clip with laparoscopic scissors. A 14-gauge Angiocath was then placed through the anterior abdominal wall under direct vision. A Reddick cholangiogram catheter was then placed through the Angiocath and flushed. The catheter was then placed in the cystic duct and anchored in place with a clip. A cholangiogram was obtained that showed no filling defects good emptying into the duodenum an adequate length on the cystic duct. The anchoring clip and catheters were then removed from the patient. 3 clips were placed  proximally on the cystic duct and the duct was divided between the 2 sets of clips. Posterior to this the cystic artery was identified and again dissected bluntly in a circumferential manner until a good window  was created. 2 clips were placed proximally and one  distally on the artery and the artery was divided between the 2 sets of clips. Next a laparoscopic hook cautery device was used to separate the gallbladder from the liver bed. Prior to completely detaching the gallbladder from the liver bed the liver bed was inspected and several small bleeding points were coagulated with the electrocautery until the area was completely hemostatic. The gallbladder was then detached the rest of it from the liver bed without difficulty. A laparoscopic bag was inserted through the hassan port. The laparoscope was moved to the epigastric port. The gallbladder was placed within the bag and the bag was sealed.  The bag with the gallbladder was then removed with the [facility] cannula through the infraumbilical port without difficulty. The fascial defect was then closed with the previously placed Vicryl pursestring stitch as well as with another figure-of-eight 0 Vicryl stitch. The liver bed was inspected again and found to be hemostatic. The abdomen was irrigated with copious amounts of saline until the effluent was clear. The ports were then removed under direct vision without difficulty and were found to be hemostatic. The gas was allowed to escape. The skin incisions were all closed with interrupted 4-0 Monocryl subcuticular stitches. Dermabond dressings were applied. The patient tolerated the procedure well. At the end of the case all needle sponge and instrument counts were correct. The patient was then awakened and taken to recovery in stable condition  PLAN OF CARE: Discharge to home after PACU  PATIENT DISPOSITION:  PACU - hemodynamically stable.   Delay start of Pharmacological VTE agent (>24hrs) due to surgical blood loss or risk of bleeding: not applicable

## 2017-07-28 NOTE — Transfer of Care (Signed)
Immediate Anesthesia Transfer of Care Note  Patient: [name]  Procedure(s) Performed: LAPAROSCOPIC CHOLECYSTECTOMY WITH INTRAOPERATIVE CHOLANGIOGRAM (N/A Abdomen)  Patient Location: PACU  Anesthesia Type:General  Level of Consciousness: oriented, drowsy and patient cooperative  Airway & Oxygen Therapy: Patient Spontanous Breathing and Patient connected to nasal cannula oxygen  Post-op Assessment: Report given to RN, Post -op Vital signs reviewed and stable and Patient moving all extremities  Post vital signs: Reviewed and stable  Last Vitals:  Vitals:   07/28/17 0833 07/28/17 1255  BP: 117/72   Pulse: 61   Resp: 20   Temp: 36.8 C (P) 36.4 C  SpO2: 98%     Last Pain:  Vitals:   07/28/17 1255  TempSrc:   PainSc: (P) Asleep         Complications: No apparent anesthesia complications

## 2017-07-28 NOTE — Anesthesia Postprocedure Evaluation (Signed)
Anesthesia Post Note  Patient: Sandra [name]  Procedure(s) Performed: LAPAROSCOPIC CHOLECYSTECTOMY WITH INTRAOPERATIVE CHOLANGIOGRAM (N/A Abdomen)     Patient location during evaluation: PACU Anesthesia Type: General Level of consciousness: awake and alert Pain management: pain level controlled Vital Signs Assessment: post-procedure vital signs reviewed and stable Respiratory status: spontaneous breathing, nonlabored ventilation and respiratory function stable Cardiovascular status: blood pressure returned to baseline and stable Postop Assessment: no apparent nausea or vomiting Anesthetic complications: no    Last Vitals:  Vitals:   07/28/17 1330 07/28/17 1340  BP: 119/75 (!) 121/57  Pulse: 62 62  Resp: 17 18  Temp:    SpO2: 99% 96%    Last Pain:  Vitals:   07/28/17 1304  TempSrc:   PainSc: 10-Worst pain ever                 Jamiria Langill,W. EDMOND

## 2017-07-28 NOTE — Interval H&P Note (Signed)
History and Physical Interval Note:  07/28/2017 11:13 AM  Sandra [name]  has presented today for surgery, with the diagnosis of Gallstones  The various methods of treatment have been discussed with the patient and family. After consideration of risks, benefits and other options for treatment, the patient has consented to  Procedure(s): LAPAROSCOPIC CHOLECYSTECTOMY WITH INTRAOPERATIVE CHOLANGIOGRAM (N/A) as a surgical intervention .  The patient's history has been reviewed, patient examined, no change in status, stable for surgery.  I have reviewed the patient's chart and labs.  Questions were answered to the patient's satisfaction.     TOTH III,Riyana Biel S

## 2017-07-28 NOTE — Anesthesia Preprocedure Evaluation (Addendum)
Anesthesia Evaluation  Patient identified by MRN, date of birth, ID band Patient awake    Reviewed: Allergy & Precautions, NPO status , Patient's Chart, lab work & pertinent test results, reviewed documented beta blocker date and time   Airway Mallampati: II  TM Distance: >3 FB Neck ROM: Full    Dental no notable dental hx. (+) Upper Dentures, Lower Dentures, Dental Advisory Given   Pulmonary Current Smoker,    Pulmonary exam normal breath sounds clear to auscultation       Cardiovascular hypertension, Pt. on medications and Pt. on home beta blockers + CAD, + Past MI, + Cardiac Stents and + Peripheral Vascular Disease  negative cardio ROS Normal cardiovascular exam Rhythm:Regular Rate:Normal     Neuro/Psych Anxiety Depression CVA, Residual Symptoms    GI/Hepatic negative GI ROS, Neg liver ROS,   Endo/Other  negative endocrine ROS  Renal/GU negative Renal ROS  negative genitourinary   Musculoskeletal negative musculoskeletal ROS (+) Arthritis , Osteoarthritis,    Abdominal   Peds negative pediatric ROS (+)  Hematology negative hematology ROS (+)   Anesthesia Other Findings   Reproductive/Obstetrics negative OB ROS                           Anesthesia Physical Anesthesia Plan  ASA: III  Anesthesia Plan: General   Post-op Pain Management:    Induction: Intravenous  PONV Risk Score and Plan: 3 and Treatment may vary due to age or medical condition, Dexamethasone, Ondansetron and Midazolam  Airway Management Planned: Oral ETT  Additional Equipment:   Intra-op Plan:   Post-operative Plan: Extubation in OR  Informed Consent: I have reviewed the patients History and Physical, chart, labs and discussed the procedure including the risks, benefits and alternatives for the proposed anesthesia with the patient or authorized representative who has indicated his/her understanding and  acceptance.   Dental advisory given  Plan Discussed with: CRNA  Anesthesia Plan Comments:        Anesthesia Quick Evaluation

## 2017-07-29 ENCOUNTER — Encounter (HOSPITAL_COMMUNITY): Payer: Self-pay | Admitting: General Surgery

## 2017-07-31 NOTE — Op Note (Signed)
07/28/2017  12:04 PM  PATIENT:  Sandra [name]  57 y.o. female  PRE-OPERATIVE DIAGNOSIS:  Gallstones  POST-OPERATIVE DIAGNOSIS:  Gallstones  PROCEDURE:  Procedure(s): LAPAROSCOPIC CHOLECYSTECTOMY WITH INTRAOPERATIVE CHOLANGIOGRAM (N/A)  SURGEON:  Surgeon(s) and Role:    * Jovita Kussmaul, MD - Primary  PHYSICIAN ASSISTANT:   ASSISTANTS: Judyann Munson, RNFA   ANESTHESIA:   general  EBL:  50 mL   BLOOD ADMINISTERED:none  DRAINS: none   LOCAL MEDICATIONS USED:  MARCAINE     SPECIMEN:  Source of Specimen:  gallbladder  DISPOSITION OF SPECIMEN:  PATHOLOGY  COUNTS:  YES  TOURNIQUET:  * No tourniquets in log *  DICTATION: .Dragon Dictation  Procedure: After informed consent was obtained the patient was brought to the operating room and placed in the supine position on the operating room table. After adequate induction of general anesthesia the patient's abdomen was prepped with ChloraPrep allowed to dry and draped in usual sterile manner. An appropriate timeout was performed. The area below the umbilicus was infiltrated with quarter percent  Marcaine. A small incision was made with a 15 blade knife. The incision was carried down through the subcutaneous tissue bluntly with a hemostat and Army-Navy retractors. The linea alba was identified. The linea alba was incised with a 15 blade knife and each side was grasped with Coker clamps. The preperitoneal space was then probed with a hemostat until the peritoneum was opened and access was gained to the abdominal cavity. A 0 Vicryl pursestring stitch was placed in the fascia surrounding the opening. A Hassan cannula was then placed through the opening and anchored in place with the previously placed Vicryl purse string stitch. The abdomen was insufflated with carbon dioxide without difficulty. A laparoscope was inserted through the [facility] cannula in the right upper quadrant was inspected. Next the epigastric region was infiltrated with %  Marcaine. A small incision was made with a 15 blade knife. A 5 mm port was placed bluntly through this incision into the abdominal cavity under direct vision. Next 2 sites were chosen laterally on the right side of the abdomen for placement of 5 mm ports. Each of these areas was infiltrated with quarter percent Marcaine. Small stab incisions were made with a 15 blade knife. 5 mm ports were then placed bluntly through these incisions into the abdominal cavity under direct vision without difficulty. A blunt grasper was placed through the lateralmost 5 mm port and used to grasp the dome of the gallbladder and elevated anteriorly and superiorly. Another blunt grasper was placed through the other 5 mm port and used to retract the body and neck of the gallbladder. A dissector was placed through the epigastric port and using the electrocautery the peritoneal reflection at the gallbladder neck was opened. Blunt dissection was then carried out in this area until the gallbladder neck-cystic duct junction was readily identified and a good window was created. A single clip was placed on the gallbladder neck. A small  ductotomy was made just below the clip with laparoscopic scissors. A 14-gauge Angiocath was then placed through the anterior abdominal wall under direct vision. A Reddick cholangiogram catheter was then placed through the Angiocath and flushed. The catheter was then placed in the cystic duct and anchored in place with a clip. A cholangiogram was obtained that showed no filling defects good emptying into the duodenum an adequate length on the cystic duct. The anchoring clip and catheters were then removed from the patient. 3 clips were placed proximally on  the cystic duct and the duct was divided between the 2 sets of clips. Posterior to this the cystic artery was identified and again dissected bluntly in a circumferential manner until a good window  was created. 2 clips were placed proximally and one distally on  the artery and the artery was divided between the 2 sets of clips. Next a laparoscopic hook cautery device was used to separate the gallbladder from the liver bed. Prior to completely detaching the gallbladder from the liver bed the liver bed was inspected and several small bleeding points were coagulated with the electrocautery until the area was completely hemostatic. The gallbladder was then detached the rest of it from the liver bed without difficulty. A laparoscopic bag was inserted through the hassan port. The laparoscope was moved to the epigastric port. The gallbladder was placed within the bag and the bag was sealed.  The bag with the gallbladder was then removed with the [facility] cannula through the infraumbilical port without difficulty. The fascial defect was then closed with the previously placed Vicryl pursestring stitch as well as with another figure-of-eight 0 Vicryl stitch. The liver bed was inspected again and found to be hemostatic. The abdomen was irrigated with copious amounts of saline until the effluent was clear. The ports were then removed under direct vision without difficulty and were found to be hemostatic. The gas was allowed to escape. The skin incisions were all closed with interrupted 4-0 Monocryl subcuticular stitches. Dermabond dressings were applied. The patient tolerated the procedure well. At the end of the case all needle sponge and instrument counts were correct. The patient was then awakened and taken to recovery in stable condition  PLAN OF CARE: Discharge to home after PACU  PATIENT DISPOSITION:  PACU - hemodynamically stable.   Delay start of Pharmacological VTE agent (>24hrs) due to surgical blood loss or risk of bleeding: not applicable

## 2017-08-01 ENCOUNTER — Telehealth: Payer: Self-pay | Admitting: *Deleted

## 2017-08-01 ENCOUNTER — Telehealth: Payer: Self-pay

## 2017-08-01 NOTE — Telephone Encounter (Signed)
Copied from [place] [id number]. Topic: Referral - Medical Records >> Aug 01, 2017  8:49 AM Bennye Alm wrote: Reason for CRM: Janett Billow with [facility] called, stated that she needed a copy of the pathology report, was sent over the actual procedure note. She needs a copy of the thyroid needle biopsy  pathology report to see what kind of specimen it was that they actually biopsied.  Phone is [phone] Fax [phone]

## 2017-08-01 NOTE — Telephone Encounter (Signed)
Result faxed to below number.  Copied from [place]. Topic: Referral - Medical Records >> Aug 01, 2017  8:29 AM Arletha Grippe wrote: Reason for CRM: Janett Billow from [facility] called - she needs thyroid needle biopsy result faxed to them.   - pathology report  Phone is [phone] Fax [phone] --pt has appointment this morning

## 2017-08-01 NOTE — Telephone Encounter (Signed)
Records faxed.

## 2017-08-02 ENCOUNTER — Telehealth: Payer: Self-pay | Admitting: *Deleted

## 2017-08-02 NOTE — Telephone Encounter (Signed)
Received Cytopathology Report results from [facility]; forwarded to provider/SLS 02/13

## 2017-08-04 ENCOUNTER — Other Ambulatory Visit: Payer: Self-pay | Admitting: Family Medicine

## 2017-08-08 ENCOUNTER — Telehealth: Payer: Self-pay | Admitting: Family Medicine

## 2017-08-08 NOTE — Telephone Encounter (Signed)
Copied from [place]. Topic: Quick Communication - See Telephone Encounter >> Aug 08, 2017 11:16 AM Bea Graff, NT wrote: CRM for notification. See Telephone encounter for: Pt is needing her prescription for  cloNIDine (CATAPRES) sent to [facility] asap.  08/08/17.

## 2017-08-08 NOTE — Telephone Encounter (Signed)
Patient will call Dr. Stanford Breed for refill

## 2017-08-17 DIAGNOSIS — E539 Vitamin B deficiency, unspecified: Secondary | ICD-10-CM | POA: Diagnosis not present

## 2017-08-17 DIAGNOSIS — M5136 Other intervertebral disc degeneration, lumbar region: Secondary | ICD-10-CM | POA: Diagnosis not present

## 2017-08-17 DIAGNOSIS — D510 Vitamin B12 deficiency anemia due to intrinsic factor deficiency: Secondary | ICD-10-CM | POA: Diagnosis not present

## 2017-08-17 DIAGNOSIS — M545 Low back pain: Secondary | ICD-10-CM | POA: Diagnosis not present

## 2017-08-17 DIAGNOSIS — Z79899 Other long term (current) drug therapy: Secondary | ICD-10-CM | POA: Diagnosis not present

## 2017-08-21 ENCOUNTER — Encounter: Payer: Self-pay | Admitting: *Deleted

## 2017-08-21 DIAGNOSIS — K635 Polyp of colon: Secondary | ICD-10-CM | POA: Insufficient documentation

## 2017-08-22 DIAGNOSIS — H02401 Unspecified ptosis of right eyelid: Secondary | ICD-10-CM | POA: Diagnosis not present

## 2017-08-22 DIAGNOSIS — H02832 Dermatochalasis of right lower eyelid: Secondary | ICD-10-CM | POA: Diagnosis not present

## 2017-08-22 DIAGNOSIS — H02834 Dermatochalasis of left upper eyelid: Secondary | ICD-10-CM | POA: Diagnosis not present

## 2017-08-22 DIAGNOSIS — H44003 Unspecified purulent endophthalmitis, bilateral: Secondary | ICD-10-CM | POA: Diagnosis not present

## 2017-08-22 DIAGNOSIS — H35032 Hypertensive retinopathy, left eye: Secondary | ICD-10-CM | POA: Diagnosis not present

## 2017-08-22 DIAGNOSIS — H02831 Dermatochalasis of right upper eyelid: Secondary | ICD-10-CM | POA: Diagnosis not present

## 2017-08-22 DIAGNOSIS — H02835 Dermatochalasis of left lower eyelid: Secondary | ICD-10-CM | POA: Diagnosis not present

## 2017-08-22 DIAGNOSIS — H5347 Heteronymous bilateral field defects: Secondary | ICD-10-CM | POA: Diagnosis not present

## 2017-08-22 DIAGNOSIS — H5440 Blindness, one eye, unspecified eye: Secondary | ICD-10-CM | POA: Diagnosis not present

## 2017-08-28 ENCOUNTER — Ambulatory Visit: Payer: Medicare HMO | Admitting: Family Medicine

## 2017-09-04 DIAGNOSIS — I10 Essential (primary) hypertension: Secondary | ICD-10-CM | POA: Diagnosis not present

## 2017-09-04 DIAGNOSIS — E539 Vitamin B deficiency, unspecified: Secondary | ICD-10-CM | POA: Diagnosis not present

## 2017-09-04 DIAGNOSIS — M545 Low back pain: Secondary | ICD-10-CM | POA: Diagnosis not present

## 2017-09-04 DIAGNOSIS — Z79899 Other long term (current) drug therapy: Secondary | ICD-10-CM | POA: Diagnosis not present

## 2017-09-04 DIAGNOSIS — S32010A Wedge compression fracture of first lumbar vertebra, initial encounter for closed fracture: Secondary | ICD-10-CM | POA: Diagnosis not present

## 2017-09-04 DIAGNOSIS — M5136 Other intervertebral disc degeneration, lumbar region: Secondary | ICD-10-CM | POA: Diagnosis not present

## 2017-09-04 DIAGNOSIS — D510 Vitamin B12 deficiency anemia due to intrinsic factor deficiency: Secondary | ICD-10-CM | POA: Diagnosis not present

## 2017-09-08 ENCOUNTER — Other Ambulatory Visit: Payer: Self-pay | Admitting: Cardiology

## 2017-09-08 ENCOUNTER — Telehealth: Payer: Self-pay | Admitting: Acute Care

## 2017-09-11 NOTE — Telephone Encounter (Signed)
Sandra [name], pt is calling to schedule LDCT.  Can you call her to schedule?

## 2017-09-12 NOTE — Telephone Encounter (Signed)
Patient has been scheduled at the [facility] in [facility] for the [place] on 09/29/2017 @ 10:00am patient and caretaker are aware of this appt and location

## 2017-09-20 ENCOUNTER — Other Ambulatory Visit: Payer: Self-pay | Admitting: Internal Medicine

## 2017-09-20 DIAGNOSIS — Z1231 Encounter for screening mammogram for malignant neoplasm of breast: Secondary | ICD-10-CM

## 2017-09-21 ENCOUNTER — Ambulatory Visit: Payer: Medicare HMO | Admitting: Podiatry

## 2017-09-21 ENCOUNTER — Encounter: Payer: Self-pay | Admitting: Podiatry

## 2017-09-21 DIAGNOSIS — M79672 Pain in left foot: Secondary | ICD-10-CM

## 2017-09-21 DIAGNOSIS — M79671 Pain in right foot: Secondary | ICD-10-CM | POA: Diagnosis not present

## 2017-09-21 DIAGNOSIS — B351 Tinea unguium: Secondary | ICD-10-CM

## 2017-09-21 DIAGNOSIS — L600 Ingrowing nail: Secondary | ICD-10-CM | POA: Diagnosis not present

## 2017-09-21 NOTE — Progress Notes (Signed)
Subjective: 57 y.o. year old female patient presents complaining of painful nails. Patient requests toe nails, corns and calluses trimmed.   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 Left side weakness after stroke.  Objective: Dermatologic: Thick yellow deformed nails x 10. Ingrown hallucal nails painful without skin infection or drainage. Vascular: Pedal pulses are all palpable. Orthopedic: Contracted lesser digits bilateral. Neurologic: All epicritic and tactile sensations grossly intact.  Assessment: Dystrophic mycotic nails x 10. Painful ingrown nail both great toes. Pain in both feet.  Treatment: Reviewed findings and available treatment options. All mycotic nails and ingrown nails debrided.  Return in 3 months or may set for nail surgery on ingrown hallucal nails.

## 2017-09-21 NOTE — Patient Instructions (Signed)
Seen for hypertrophic nails and painful ingrown nails. All nails debrided. Return in 3 months or set up for ingrown nail surgery.

## 2017-09-29 ENCOUNTER — Ambulatory Visit (HOSPITAL_BASED_OUTPATIENT_CLINIC_OR_DEPARTMENT_OTHER)
Admission: RE | Admit: 2017-09-29 | Discharge: 2017-09-29 | Disposition: A | Discharge status: Home / Self Care | Payer: Medicare HMO | Source: Ambulatory Visit | Attending: Acute Care | Admitting: Acute Care

## 2017-09-29 ENCOUNTER — Other Ambulatory Visit (HOSPITAL_BASED_OUTPATIENT_CLINIC_OR_DEPARTMENT_OTHER): Payer: Self-pay | Admitting: Internal Medicine

## 2017-09-29 DIAGNOSIS — Z122 Encounter for screening for malignant neoplasm of respiratory organs: Secondary | ICD-10-CM | POA: Insufficient documentation

## 2017-09-29 DIAGNOSIS — F17210 Nicotine dependence, cigarettes, uncomplicated: Secondary | ICD-10-CM | POA: Diagnosis not present

## 2017-09-29 DIAGNOSIS — J439 Emphysema, unspecified: Secondary | ICD-10-CM | POA: Insufficient documentation

## 2017-09-29 DIAGNOSIS — I700 Atherosclerosis of aorta: Secondary | ICD-10-CM | POA: Diagnosis not present

## 2017-09-29 DIAGNOSIS — I2510 Atherosclerotic heart disease of native coronary artery without angina pectoris: Secondary | ICD-10-CM | POA: Insufficient documentation

## 2017-09-29 DIAGNOSIS — E041 Nontoxic single thyroid nodule: Secondary | ICD-10-CM | POA: Diagnosis not present

## 2017-09-29 DIAGNOSIS — Z1231 Encounter for screening mammogram for malignant neoplasm of breast: Secondary | ICD-10-CM

## 2017-10-02 ENCOUNTER — Other Ambulatory Visit: Payer: Self-pay | Admitting: Acute Care

## 2017-10-02 DIAGNOSIS — Z122 Encounter for screening for malignant neoplasm of respiratory organs: Secondary | ICD-10-CM

## 2017-10-02 DIAGNOSIS — F17210 Nicotine dependence, cigarettes, uncomplicated: Principal | ICD-10-CM

## 2017-10-11 ENCOUNTER — Ambulatory Visit: Payer: Medicare HMO

## 2017-10-11 ENCOUNTER — Other Ambulatory Visit: Payer: Self-pay | Admitting: Cardiology

## 2017-10-11 ENCOUNTER — Ambulatory Visit (HOSPITAL_BASED_OUTPATIENT_CLINIC_OR_DEPARTMENT_OTHER)
Admission: RE | Admit: 2017-10-11 | Discharge: 2017-10-11 | Disposition: A | Discharge status: Home / Self Care | Payer: Medicare HMO | Source: Ambulatory Visit | Attending: Internal Medicine | Admitting: Internal Medicine

## 2017-10-11 DIAGNOSIS — Z1231 Encounter for screening mammogram for malignant neoplasm of breast: Secondary | ICD-10-CM | POA: Diagnosis not present

## 2017-10-11 NOTE — Telephone Encounter (Signed)
REFILL 

## 2017-10-12 DIAGNOSIS — M545 Low back pain: Secondary | ICD-10-CM | POA: Diagnosis not present

## 2017-10-12 DIAGNOSIS — M5136 Other intervertebral disc degeneration, lumbar region: Secondary | ICD-10-CM | POA: Diagnosis not present

## 2017-10-12 DIAGNOSIS — S32010A Wedge compression fracture of first lumbar vertebra, initial encounter for closed fracture: Secondary | ICD-10-CM | POA: Diagnosis not present

## 2017-10-12 DIAGNOSIS — E539 Vitamin B deficiency, unspecified: Secondary | ICD-10-CM | POA: Diagnosis not present

## 2017-10-12 DIAGNOSIS — Z79899 Other long term (current) drug therapy: Secondary | ICD-10-CM | POA: Diagnosis not present

## 2017-10-12 DIAGNOSIS — D510 Vitamin B12 deficiency anemia due to intrinsic factor deficiency: Secondary | ICD-10-CM | POA: Diagnosis not present

## 2017-11-10 DIAGNOSIS — Z79899 Other long term (current) drug therapy: Secondary | ICD-10-CM | POA: Diagnosis not present

## 2017-11-10 DIAGNOSIS — M545 Low back pain: Secondary | ICD-10-CM | POA: Diagnosis not present

## 2017-11-10 DIAGNOSIS — D539 Nutritional anemia, unspecified: Secondary | ICD-10-CM | POA: Diagnosis not present

## 2017-11-10 DIAGNOSIS — E539 Vitamin B deficiency, unspecified: Secondary | ICD-10-CM | POA: Diagnosis not present

## 2017-11-10 DIAGNOSIS — D510 Vitamin B12 deficiency anemia due to intrinsic factor deficiency: Secondary | ICD-10-CM | POA: Diagnosis not present

## 2017-11-10 DIAGNOSIS — R5383 Other fatigue: Secondary | ICD-10-CM | POA: Diagnosis not present

## 2017-11-10 DIAGNOSIS — M129 Arthropathy, unspecified: Secondary | ICD-10-CM | POA: Diagnosis not present

## 2017-11-10 DIAGNOSIS — E559 Vitamin D deficiency, unspecified: Secondary | ICD-10-CM | POA: Diagnosis not present

## 2017-11-10 DIAGNOSIS — M5136 Other intervertebral disc degeneration, lumbar region: Secondary | ICD-10-CM | POA: Diagnosis not present

## 2017-11-10 DIAGNOSIS — R300 Dysuria: Secondary | ICD-10-CM | POA: Diagnosis not present

## 2017-11-10 DIAGNOSIS — R0602 Shortness of breath: Secondary | ICD-10-CM | POA: Diagnosis not present

## 2017-11-10 DIAGNOSIS — E7800 Pure hypercholesterolemia, unspecified: Secondary | ICD-10-CM | POA: Diagnosis not present

## 2017-11-28 DIAGNOSIS — R0989 Other specified symptoms and signs involving the circulatory and respiratory systems: Secondary | ICD-10-CM | POA: Diagnosis not present

## 2017-11-28 DIAGNOSIS — I739 Peripheral vascular disease, unspecified: Secondary | ICD-10-CM | POA: Diagnosis not present

## 2017-12-11 DIAGNOSIS — E539 Vitamin B deficiency, unspecified: Secondary | ICD-10-CM | POA: Diagnosis not present

## 2017-12-11 DIAGNOSIS — M5136 Other intervertebral disc degeneration, lumbar region: Secondary | ICD-10-CM | POA: Diagnosis not present

## 2017-12-11 DIAGNOSIS — D510 Vitamin B12 deficiency anemia due to intrinsic factor deficiency: Secondary | ICD-10-CM | POA: Diagnosis not present

## 2017-12-11 DIAGNOSIS — M545 Low back pain: Secondary | ICD-10-CM | POA: Diagnosis not present

## 2017-12-11 DIAGNOSIS — S32010A Wedge compression fracture of first lumbar vertebra, initial encounter for closed fracture: Secondary | ICD-10-CM | POA: Diagnosis not present

## 2017-12-11 DIAGNOSIS — Z79899 Other long term (current) drug therapy: Secondary | ICD-10-CM | POA: Diagnosis not present

## 2017-12-25 ENCOUNTER — Ambulatory Visit: Payer: Medicare HMO | Admitting: Podiatry

## 2018-01-04 ENCOUNTER — Encounter: Payer: Self-pay | Admitting: Podiatry

## 2018-01-04 ENCOUNTER — Ambulatory Visit: Payer: Medicare HMO | Admitting: Podiatry

## 2018-01-04 VITALS — BP 140/73 | HR 81 | Ht 65.0 in | Wt 172.0 lb

## 2018-01-04 DIAGNOSIS — L03032 Cellulitis of left toe: Secondary | ICD-10-CM

## 2018-01-04 DIAGNOSIS — L600 Ingrowing nail: Secondary | ICD-10-CM | POA: Diagnosis not present

## 2018-01-04 DIAGNOSIS — L03031 Cellulitis of right toe: Secondary | ICD-10-CM

## 2018-01-04 MED ORDER — AMOXICILLIN-POT CLAVULANATE 500-125 MG PO TABS: 1.0000 | ORAL_TABLET | Freq: Three times a day (TID) | ORAL | 0 refills | Status: DC

## 2018-01-04 NOTE — Patient Instructions (Signed)
Ingrown nail surgery was done on both great toe medial borders.. Follow soaking instruction.  Some redness and drainage is expected. Take antibiotics as prescribed. Call the office if the area gets feverish with increased redness and drainage. Return in one week.

## 2018-01-04 NOTE — Progress Notes (Signed)
Subjective: 57 y.o. year old female patient presents complaining of painful nails. Patient requests toe nail surgeries on both great toe nails.   Objective: Dermatologic: Thick dystrophic ingrown hallucal nails with fungal debris. Inflamed medial ungual labia bilateral hallux. Hypertrophic fungal nails x 10. Vascular: Pedal pulses are all palpable. Orthopedic: Contracted lesser digits  Neurologic: All epicritic and tactile sensations grossly intact.  Assessment: Ingrown nail both great toes medial border with inflamed ungual labia.  Treatment: As per request, Phenol and Alcohol matrixectomy done on both great toe medial borders. Both great toes were anesthetized with total 42m mixture of 50/50 0.5% Marcaine plain and 1% Xylocaine plain. Affected medial nail border on both great toes reflected with a nail elevator and excised with nail nipper. Proximal nail matrix tissue was cauterized with Phenol soaked cotton applicator x 4 and neutralized with Alcohol soaked cotton applicator. The wound was dressed with Amerigel ointment dressing. All other nails debrided.  Soaking instruction with supplies dispensed. Antibiotic e-scribed. Return in one week.

## 2018-01-09 DIAGNOSIS — M5136 Other intervertebral disc degeneration, lumbar region: Secondary | ICD-10-CM | POA: Diagnosis not present

## 2018-01-09 DIAGNOSIS — D510 Vitamin B12 deficiency anemia due to intrinsic factor deficiency: Secondary | ICD-10-CM | POA: Diagnosis not present

## 2018-01-09 DIAGNOSIS — M545 Low back pain: Secondary | ICD-10-CM | POA: Diagnosis not present

## 2018-01-09 DIAGNOSIS — Z79899 Other long term (current) drug therapy: Secondary | ICD-10-CM | POA: Diagnosis not present

## 2018-01-09 DIAGNOSIS — G4700 Insomnia, unspecified: Secondary | ICD-10-CM | POA: Diagnosis not present

## 2018-01-09 DIAGNOSIS — E539 Vitamin B deficiency, unspecified: Secondary | ICD-10-CM | POA: Diagnosis not present

## 2018-01-15 ENCOUNTER — Ambulatory Visit (INDEPENDENT_AMBULATORY_CARE_PROVIDER_SITE_OTHER): Payer: Medicare HMO | Admitting: Podiatry

## 2018-01-15 ENCOUNTER — Encounter: Payer: Self-pay | Admitting: Podiatry

## 2018-01-15 DIAGNOSIS — Z9889 Other specified postprocedural states: Secondary | ICD-10-CM

## 2018-01-15 NOTE — Progress Notes (Signed)
57 year old presents accompanied by her husband complaining of much pain in her toes. 1 week post op following nail surgery, both great toe medial border. Wound healing normal without infection. Clean nail borders noted without any drainage. Continue to soak if tenderness exist. May leave it open at night. Cover it during the day for another week. Return in 3 month for routine foot care.

## 2018-01-15 NOTE — Patient Instructions (Signed)
1 week post op following nail surgery. Wound healing normal without infection. Continue to soak if tenderness exist. May leave it open at night. Cover it during the day for another week. Return in 3 month for routine foot care.

## 2018-02-08 DIAGNOSIS — Z79899 Other long term (current) drug therapy: Secondary | ICD-10-CM | POA: Diagnosis not present

## 2018-02-08 DIAGNOSIS — I10 Essential (primary) hypertension: Secondary | ICD-10-CM | POA: Diagnosis not present

## 2018-02-08 DIAGNOSIS — M545 Low back pain: Secondary | ICD-10-CM | POA: Diagnosis not present

## 2018-02-08 DIAGNOSIS — S32010A Wedge compression fracture of first lumbar vertebra, initial encounter for closed fracture: Secondary | ICD-10-CM | POA: Diagnosis not present

## 2018-02-08 DIAGNOSIS — G4700 Insomnia, unspecified: Secondary | ICD-10-CM | POA: Diagnosis not present

## 2018-02-09 DIAGNOSIS — D510 Vitamin B12 deficiency anemia due to intrinsic factor deficiency: Secondary | ICD-10-CM | POA: Diagnosis not present

## 2018-02-09 DIAGNOSIS — E539 Vitamin B deficiency, unspecified: Secondary | ICD-10-CM | POA: Diagnosis not present

## 2018-02-22 DIAGNOSIS — H5440 Blindness, one eye, unspecified eye: Secondary | ICD-10-CM | POA: Diagnosis not present

## 2018-02-22 DIAGNOSIS — H02834 Dermatochalasis of left upper eyelid: Secondary | ICD-10-CM | POA: Diagnosis not present

## 2018-02-22 DIAGNOSIS — H02831 Dermatochalasis of right upper eyelid: Secondary | ICD-10-CM | POA: Diagnosis not present

## 2018-02-22 DIAGNOSIS — H02835 Dermatochalasis of left lower eyelid: Secondary | ICD-10-CM | POA: Diagnosis not present

## 2018-02-22 DIAGNOSIS — H35032 Hypertensive retinopathy, left eye: Secondary | ICD-10-CM | POA: Diagnosis not present

## 2018-02-22 DIAGNOSIS — H5340 Unspecified visual field defects: Secondary | ICD-10-CM | POA: Diagnosis not present

## 2018-02-22 DIAGNOSIS — H02832 Dermatochalasis of right lower eyelid: Secondary | ICD-10-CM | POA: Diagnosis not present

## 2018-02-22 DIAGNOSIS — H268 Other specified cataract: Secondary | ICD-10-CM | POA: Diagnosis not present

## 2018-02-22 DIAGNOSIS — H5347 Heteronymous bilateral field defects: Secondary | ICD-10-CM | POA: Diagnosis not present

## 2018-02-23 DIAGNOSIS — H5203 Hypermetropia, bilateral: Secondary | ICD-10-CM | POA: Diagnosis not present

## 2018-02-23 DIAGNOSIS — H52209 Unspecified astigmatism, unspecified eye: Secondary | ICD-10-CM | POA: Diagnosis not present

## 2018-02-23 DIAGNOSIS — H524 Presbyopia: Secondary | ICD-10-CM | POA: Diagnosis not present

## 2018-02-28 ENCOUNTER — Ambulatory Visit: Payer: Medicare HMO | Admitting: *Deleted

## 2018-03-08 DIAGNOSIS — D510 Vitamin B12 deficiency anemia due to intrinsic factor deficiency: Secondary | ICD-10-CM | POA: Diagnosis not present

## 2018-03-08 DIAGNOSIS — E539 Vitamin B deficiency, unspecified: Secondary | ICD-10-CM | POA: Diagnosis not present

## 2018-03-08 DIAGNOSIS — M545 Low back pain: Secondary | ICD-10-CM | POA: Diagnosis not present

## 2018-03-08 DIAGNOSIS — Z23 Encounter for immunization: Secondary | ICD-10-CM | POA: Diagnosis not present

## 2018-03-08 DIAGNOSIS — Z79899 Other long term (current) drug therapy: Secondary | ICD-10-CM | POA: Diagnosis not present

## 2018-03-08 DIAGNOSIS — G4700 Insomnia, unspecified: Secondary | ICD-10-CM | POA: Diagnosis not present

## 2018-03-08 DIAGNOSIS — S32010A Wedge compression fracture of first lumbar vertebra, initial encounter for closed fracture: Secondary | ICD-10-CM | POA: Diagnosis not present

## 2018-04-05 DIAGNOSIS — M5136 Other intervertebral disc degeneration, lumbar region: Secondary | ICD-10-CM | POA: Diagnosis not present

## 2018-04-05 DIAGNOSIS — Z79899 Other long term (current) drug therapy: Secondary | ICD-10-CM | POA: Diagnosis not present

## 2018-04-05 DIAGNOSIS — D510 Vitamin B12 deficiency anemia due to intrinsic factor deficiency: Secondary | ICD-10-CM | POA: Diagnosis not present

## 2018-04-05 DIAGNOSIS — G4700 Insomnia, unspecified: Secondary | ICD-10-CM | POA: Diagnosis not present

## 2018-04-05 DIAGNOSIS — M545 Low back pain: Secondary | ICD-10-CM | POA: Diagnosis not present

## 2018-04-05 DIAGNOSIS — E539 Vitamin B deficiency, unspecified: Secondary | ICD-10-CM | POA: Diagnosis not present

## 2018-04-06 ENCOUNTER — Other Ambulatory Visit: Payer: Self-pay | Admitting: Cardiology

## 2018-04-11 ENCOUNTER — Telehealth (HOSPITAL_COMMUNITY): Payer: Self-pay

## 2018-05-03 DIAGNOSIS — E539 Vitamin B deficiency, unspecified: Secondary | ICD-10-CM | POA: Diagnosis not present

## 2018-05-03 DIAGNOSIS — R5383 Other fatigue: Secondary | ICD-10-CM | POA: Diagnosis not present

## 2018-05-03 DIAGNOSIS — M129 Arthropathy, unspecified: Secondary | ICD-10-CM | POA: Diagnosis not present

## 2018-05-03 DIAGNOSIS — R300 Dysuria: Secondary | ICD-10-CM | POA: Diagnosis not present

## 2018-05-03 DIAGNOSIS — D539 Nutritional anemia, unspecified: Secondary | ICD-10-CM | POA: Diagnosis not present

## 2018-05-03 DIAGNOSIS — Z79899 Other long term (current) drug therapy: Secondary | ICD-10-CM | POA: Diagnosis not present

## 2018-05-03 DIAGNOSIS — M5136 Other intervertebral disc degeneration, lumbar region: Secondary | ICD-10-CM | POA: Diagnosis not present

## 2018-05-03 DIAGNOSIS — E7800 Pure hypercholesterolemia, unspecified: Secondary | ICD-10-CM | POA: Diagnosis not present

## 2018-05-03 DIAGNOSIS — D510 Vitamin B12 deficiency anemia due to intrinsic factor deficiency: Secondary | ICD-10-CM | POA: Diagnosis not present

## 2018-05-03 DIAGNOSIS — E559 Vitamin D deficiency, unspecified: Secondary | ICD-10-CM | POA: Diagnosis not present

## 2018-05-03 DIAGNOSIS — Z87891 Personal history of nicotine dependence: Secondary | ICD-10-CM | POA: Diagnosis not present

## 2018-05-03 DIAGNOSIS — I10 Essential (primary) hypertension: Secondary | ICD-10-CM | POA: Diagnosis not present

## 2018-05-31 DIAGNOSIS — M545 Low back pain: Secondary | ICD-10-CM | POA: Diagnosis not present

## 2018-05-31 DIAGNOSIS — D510 Vitamin B12 deficiency anemia due to intrinsic factor deficiency: Secondary | ICD-10-CM | POA: Diagnosis not present

## 2018-05-31 DIAGNOSIS — G4700 Insomnia, unspecified: Secondary | ICD-10-CM | POA: Diagnosis not present

## 2018-05-31 DIAGNOSIS — Z79899 Other long term (current) drug therapy: Secondary | ICD-10-CM | POA: Diagnosis not present

## 2018-05-31 DIAGNOSIS — I10 Essential (primary) hypertension: Secondary | ICD-10-CM | POA: Diagnosis not present

## 2018-05-31 DIAGNOSIS — E7800 Pure hypercholesterolemia, unspecified: Secondary | ICD-10-CM | POA: Diagnosis not present

## 2018-05-31 DIAGNOSIS — E538 Deficiency of other specified B group vitamins: Secondary | ICD-10-CM | POA: Diagnosis not present

## 2018-06-09 ENCOUNTER — Other Ambulatory Visit: Payer: Self-pay

## 2018-06-09 ENCOUNTER — Emergency Department (HOSPITAL_BASED_OUTPATIENT_CLINIC_OR_DEPARTMENT_OTHER)
Admission: EM | Admit: 2018-06-09 | Discharge: 2018-06-09 | Disposition: A | Discharge status: Home / Self Care | Payer: Medicare HMO | Attending: Emergency Medicine | Admitting: Emergency Medicine

## 2018-06-09 ENCOUNTER — Encounter (HOSPITAL_BASED_OUTPATIENT_CLINIC_OR_DEPARTMENT_OTHER): Payer: Self-pay | Admitting: Emergency Medicine

## 2018-06-09 ENCOUNTER — Emergency Department (HOSPITAL_BASED_OUTPATIENT_CLINIC_OR_DEPARTMENT_OTHER): Payer: Medicare HMO

## 2018-06-09 DIAGNOSIS — I10 Essential (primary) hypertension: Secondary | ICD-10-CM | POA: Diagnosis not present

## 2018-06-09 DIAGNOSIS — F329 Major depressive disorder, single episode, unspecified: Secondary | ICD-10-CM | POA: Insufficient documentation

## 2018-06-09 DIAGNOSIS — I2510 Atherosclerotic heart disease of native coronary artery without angina pectoris: Secondary | ICD-10-CM | POA: Diagnosis not present

## 2018-06-09 DIAGNOSIS — Z7902 Long term (current) use of antithrombotics/antiplatelets: Secondary | ICD-10-CM | POA: Insufficient documentation

## 2018-06-09 DIAGNOSIS — Z8673 Personal history of transient ischemic attack (TIA), and cerebral infarction without residual deficits: Secondary | ICD-10-CM | POA: Insufficient documentation

## 2018-06-09 DIAGNOSIS — S76011A Strain of muscle, fascia and tendon of right hip, initial encounter: Secondary | ICD-10-CM | POA: Diagnosis not present

## 2018-06-09 DIAGNOSIS — S79911A Unspecified injury of right hip, initial encounter: Secondary | ICD-10-CM | POA: Diagnosis not present

## 2018-06-09 DIAGNOSIS — S3992XA Unspecified injury of lower back, initial encounter: Secondary | ICD-10-CM | POA: Diagnosis not present

## 2018-06-09 DIAGNOSIS — Y9389 Activity, other specified: Secondary | ICD-10-CM | POA: Insufficient documentation

## 2018-06-09 DIAGNOSIS — Z79899 Other long term (current) drug therapy: Secondary | ICD-10-CM | POA: Diagnosis not present

## 2018-06-09 DIAGNOSIS — Z9049 Acquired absence of other specified parts of digestive tract: Secondary | ICD-10-CM | POA: Diagnosis not present

## 2018-06-09 DIAGNOSIS — F17210 Nicotine dependence, cigarettes, uncomplicated: Secondary | ICD-10-CM | POA: Insufficient documentation

## 2018-06-09 DIAGNOSIS — M25551 Pain in right hip: Secondary | ICD-10-CM | POA: Diagnosis not present

## 2018-06-09 DIAGNOSIS — S39012A Strain of muscle, fascia and tendon of lower back, initial encounter: Secondary | ICD-10-CM | POA: Diagnosis not present

## 2018-06-09 DIAGNOSIS — Y998 Other external cause status: Secondary | ICD-10-CM | POA: Insufficient documentation

## 2018-06-09 DIAGNOSIS — I252 Old myocardial infarction: Secondary | ICD-10-CM | POA: Insufficient documentation

## 2018-06-09 DIAGNOSIS — F419 Anxiety disorder, unspecified: Secondary | ICD-10-CM | POA: Insufficient documentation

## 2018-06-09 DIAGNOSIS — Y92410 Unspecified street and highway as the place of occurrence of the external cause: Secondary | ICD-10-CM | POA: Insufficient documentation

## 2018-06-09 DIAGNOSIS — M545 Low back pain: Secondary | ICD-10-CM | POA: Diagnosis not present

## 2018-06-09 HISTORY — DX: Other chronic pain: G89.29

## 2018-06-09 MED ORDER — METHOCARBAMOL 500 MG PO TABS: 1000.0000 mg | ORAL_TABLET | Freq: Three times a day (TID) | ORAL | 0 refills | Status: AC | PRN

## 2018-06-09 MED ORDER — METHOCARBAMOL 500 MG PO TABS
500.0000 mg | ORAL_TABLET | Freq: Once | ORAL | Status: AC
  Administered 2018-06-09: 500 mg via ORAL
  Filled 2018-06-09: qty 1

## 2018-06-09 MED ORDER — OXYCODONE-ACETAMINOPHEN 5-325 MG PO TABS
2.0000 | ORAL_TABLET | Freq: Once | ORAL | Status: AC
  Administered 2018-06-09: 2 via ORAL
  Filled 2018-06-09: qty 2
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
## 2018-06-09 NOTE — Discharge Instructions (Signed)
Continue your usual pain medications. I have prescribed muscle relaxants to help with pain as well. Be careful taking these at the same time, as they can cause sedation. Do not drive while taking pain medications or muscle relaxants.  Do your best to stay active and moving during the next few days, to help prevent stiffness.
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
## 2018-06-19 DIAGNOSIS — I700 Atherosclerosis of aorta: Secondary | ICD-10-CM | POA: Diagnosis not present

## 2018-06-19 DIAGNOSIS — N8110 Cystocele, unspecified: Secondary | ICD-10-CM | POA: Diagnosis not present

## 2018-06-19 DIAGNOSIS — Z79899 Other long term (current) drug therapy: Secondary | ICD-10-CM | POA: Diagnosis not present

## 2018-06-19 DIAGNOSIS — N852 Hypertrophy of uterus: Secondary | ICD-10-CM | POA: Diagnosis not present

## 2018-06-25 DIAGNOSIS — I700 Atherosclerosis of aorta: Secondary | ICD-10-CM | POA: Diagnosis not present

## 2018-06-29 DIAGNOSIS — G4700 Insomnia, unspecified: Secondary | ICD-10-CM | POA: Diagnosis not present

## 2018-06-29 DIAGNOSIS — E538 Deficiency of other specified B group vitamins: Secondary | ICD-10-CM | POA: Diagnosis not present

## 2018-06-29 DIAGNOSIS — D510 Vitamin B12 deficiency anemia due to intrinsic factor deficiency: Secondary | ICD-10-CM | POA: Diagnosis not present

## 2018-06-29 DIAGNOSIS — Z79899 Other long term (current) drug therapy: Secondary | ICD-10-CM | POA: Diagnosis not present

## 2018-06-29 DIAGNOSIS — I10 Essential (primary) hypertension: Secondary | ICD-10-CM | POA: Diagnosis not present

## 2018-06-29 DIAGNOSIS — M545 Low back pain: Secondary | ICD-10-CM | POA: Diagnosis not present

## 2018-07-10 DIAGNOSIS — M79675 Pain in left toe(s): Secondary | ICD-10-CM | POA: Diagnosis not present

## 2018-07-10 DIAGNOSIS — M79674 Pain in right toe(s): Secondary | ICD-10-CM | POA: Diagnosis not present

## 2018-07-10 DIAGNOSIS — B351 Tinea unguium: Secondary | ICD-10-CM | POA: Diagnosis not present

## 2018-07-27 DIAGNOSIS — M5137 Other intervertebral disc degeneration, lumbosacral region: Secondary | ICD-10-CM | POA: Diagnosis not present

## 2018-07-27 DIAGNOSIS — M5136 Other intervertebral disc degeneration, lumbar region: Secondary | ICD-10-CM | POA: Diagnosis not present

## 2018-07-27 DIAGNOSIS — I10 Essential (primary) hypertension: Secondary | ICD-10-CM | POA: Diagnosis not present

## 2018-07-27 DIAGNOSIS — E538 Deficiency of other specified B group vitamins: Secondary | ICD-10-CM | POA: Diagnosis not present

## 2018-07-27 DIAGNOSIS — Z79899 Other long term (current) drug therapy: Secondary | ICD-10-CM | POA: Diagnosis not present

## 2018-07-27 DIAGNOSIS — D510 Vitamin B12 deficiency anemia due to intrinsic factor deficiency: Secondary | ICD-10-CM | POA: Diagnosis not present

## 2018-07-27 DIAGNOSIS — M545 Low back pain: Secondary | ICD-10-CM | POA: Diagnosis not present

## 2018-08-10 ENCOUNTER — Other Ambulatory Visit (HOSPITAL_BASED_OUTPATIENT_CLINIC_OR_DEPARTMENT_OTHER): Payer: Self-pay | Admitting: Internal Medicine

## 2018-08-10 DIAGNOSIS — Z1231 Encounter for screening mammogram for malignant neoplasm of breast: Secondary | ICD-10-CM

## 2018-08-29 ENCOUNTER — Other Ambulatory Visit: Payer: Self-pay | Admitting: Cardiology

## 2018-10-13 ENCOUNTER — Ambulatory Visit (HOSPITAL_BASED_OUTPATIENT_CLINIC_OR_DEPARTMENT_OTHER): Payer: Medicare HMO

## 2018-10-17 ENCOUNTER — Other Ambulatory Visit: Payer: Self-pay | Admitting: Cardiology

## 2018-10-21 ENCOUNTER — Other Ambulatory Visit: Payer: Self-pay | Admitting: Cardiology

## 2018-10-29 ENCOUNTER — Ambulatory Visit (HOSPITAL_BASED_OUTPATIENT_CLINIC_OR_DEPARTMENT_OTHER)
Admission: RE | Admit: 2018-10-29 | Discharge: 2018-10-29 | Disposition: A | Discharge status: Home / Self Care | Payer: Medicare Other | Source: Ambulatory Visit | Attending: Internal Medicine | Admitting: Internal Medicine

## 2018-10-29 ENCOUNTER — Other Ambulatory Visit: Payer: Self-pay

## 2018-10-29 DIAGNOSIS — Z1231 Encounter for screening mammogram for malignant neoplasm of breast: Secondary | ICD-10-CM | POA: Diagnosis present

## 2018-11-23 ENCOUNTER — Other Ambulatory Visit: Payer: Self-pay | Admitting: Acute Care

## 2018-11-23 DIAGNOSIS — F17210 Nicotine dependence, cigarettes, uncomplicated: Secondary | ICD-10-CM

## 2018-11-23 DIAGNOSIS — Z122 Encounter for screening for malignant neoplasm of respiratory organs: Secondary | ICD-10-CM

## 2018-11-28 ENCOUNTER — Other Ambulatory Visit (HOSPITAL_BASED_OUTPATIENT_CLINIC_OR_DEPARTMENT_OTHER): Payer: Self-pay | Admitting: Surgery

## 2018-12-08 ENCOUNTER — Other Ambulatory Visit: Payer: Self-pay | Admitting: Cardiology

## 2018-12-10 ENCOUNTER — Other Ambulatory Visit: Payer: Self-pay

## 2018-12-10 MED ORDER — CLONIDINE HCL 0.2 MG PO TABS: 0.2000 mg | ORAL_TABLET | Freq: Every day | ORAL | 2 refills | Status: DC

## 2018-12-11 ENCOUNTER — Telehealth: Payer: Medicare HMO | Admitting: Cardiology

## 2018-12-17 ENCOUNTER — Telehealth: Payer: Medicare HMO | Admitting: Cardiology

## 2018-12-24 ENCOUNTER — Ambulatory Visit (HOSPITAL_BASED_OUTPATIENT_CLINIC_OR_DEPARTMENT_OTHER): Payer: Medicare Other

## 2019-01-14 ENCOUNTER — Ambulatory Visit (HOSPITAL_BASED_OUTPATIENT_CLINIC_OR_DEPARTMENT_OTHER): Payer: Medicare Other

## 2019-01-21 ENCOUNTER — Ambulatory Visit (HOSPITAL_BASED_OUTPATIENT_CLINIC_OR_DEPARTMENT_OTHER): Payer: Medicare Other | Attending: Acute Care

## 2019-01-28 ENCOUNTER — Other Ambulatory Visit: Payer: Self-pay

## 2019-01-28 ENCOUNTER — Ambulatory Visit (HOSPITAL_BASED_OUTPATIENT_CLINIC_OR_DEPARTMENT_OTHER)
Admission: RE | Admit: 2019-01-28 | Discharge: 2019-01-28 | Disposition: A | Discharge status: Home / Self Care | Payer: Medicare Other | Source: Ambulatory Visit | Attending: Acute Care | Admitting: Acute Care

## 2019-01-28 DIAGNOSIS — Z122 Encounter for screening for malignant neoplasm of respiratory organs: Secondary | ICD-10-CM | POA: Insufficient documentation

## 2019-01-28 DIAGNOSIS — F17210 Nicotine dependence, cigarettes, uncomplicated: Secondary | ICD-10-CM | POA: Diagnosis present

## 2019-01-30 ENCOUNTER — Other Ambulatory Visit: Payer: Self-pay | Admitting: *Deleted

## 2019-01-30 DIAGNOSIS — Z122 Encounter for screening for malignant neoplasm of respiratory organs: Secondary | ICD-10-CM

## 2019-01-30 DIAGNOSIS — F17210 Nicotine dependence, cigarettes, uncomplicated: Secondary | ICD-10-CM

## 2019-01-30 DIAGNOSIS — Z87891 Personal history of nicotine dependence: Secondary | ICD-10-CM

## 2019-02-21 ENCOUNTER — Other Ambulatory Visit: Payer: Self-pay | Admitting: Cardiology

## 2019-02-21 NOTE — Telephone Encounter (Signed)
Please advise if OK to refill. Last seen 04/2017. Thank you!

## 2019-06-01 ENCOUNTER — Other Ambulatory Visit: Payer: Self-pay | Admitting: Cardiology

## 2019-07-28 ENCOUNTER — Other Ambulatory Visit: Payer: Self-pay | Admitting: Cardiology

## 2020-04-06 DIAGNOSIS — H44521 Atrophy of globe, right eye: Secondary | ICD-10-CM | POA: Insufficient documentation

## 2020-04-06 DIAGNOSIS — H268 Other specified cataract: Secondary | ICD-10-CM | POA: Insufficient documentation

## 2020-04-06 DIAGNOSIS — H52202 Unspecified astigmatism, left eye: Secondary | ICD-10-CM | POA: Insufficient documentation

## 2020-04-06 DIAGNOSIS — H5202 Hypermetropia, left eye: Secondary | ICD-10-CM | POA: Insufficient documentation

## 2020-04-06 DIAGNOSIS — H5340 Unspecified visual field defects: Secondary | ICD-10-CM | POA: Insufficient documentation

## 2020-07-29 ENCOUNTER — Ambulatory Visit: Payer: No Typology Code available for payment source | Admitting: Podiatry

## 2021-01-25 ENCOUNTER — Emergency Department (HOSPITAL_COMMUNITY): Payer: Medicare (Managed Care)

## 2021-01-25 ENCOUNTER — Encounter (HOSPITAL_COMMUNITY): Payer: Self-pay | Admitting: *Deleted

## 2021-01-25 ENCOUNTER — Emergency Department (HOSPITAL_COMMUNITY)
Admission: EM | Admit: 2021-01-25 | Discharge: 2021-01-25 | Disposition: A | Discharge status: Home / Self Care | Payer: Medicare (Managed Care) | Attending: Emergency Medicine | Admitting: Emergency Medicine

## 2021-01-25 DIAGNOSIS — I679 Cerebrovascular disease, unspecified: Secondary | ICD-10-CM | POA: Insufficient documentation

## 2021-01-25 DIAGNOSIS — R296 Repeated falls: Secondary | ICD-10-CM | POA: Insufficient documentation

## 2021-01-25 DIAGNOSIS — Z7901 Long term (current) use of anticoagulants: Secondary | ICD-10-CM | POA: Insufficient documentation

## 2021-01-25 DIAGNOSIS — Z79899 Other long term (current) drug therapy: Secondary | ICD-10-CM | POA: Diagnosis not present

## 2021-01-25 DIAGNOSIS — I10 Essential (primary) hypertension: Secondary | ICD-10-CM | POA: Diagnosis not present

## 2021-01-25 DIAGNOSIS — M545 Low back pain, unspecified: Secondary | ICD-10-CM | POA: Diagnosis present

## 2021-01-25 DIAGNOSIS — F17210 Nicotine dependence, cigarettes, uncomplicated: Secondary | ICD-10-CM | POA: Diagnosis not present

## 2021-01-25 DIAGNOSIS — M4726 Other spondylosis with radiculopathy, lumbar region: Secondary | ICD-10-CM | POA: Diagnosis not present

## 2021-01-25 DIAGNOSIS — W19XXXA Unspecified fall, initial encounter: Secondary | ICD-10-CM

## 2021-01-25 MED ORDER — PREDNISONE 20 MG PO TABS
60.0000 mg | ORAL_TABLET | Freq: Once | ORAL | Status: AC
  Administered 2021-01-25: 60 mg via ORAL
  Filled 2021-01-25: qty 3

## 2021-01-25 MED ORDER — HYDROCODONE-ACETAMINOPHEN 5-325 MG PO TABS
2.0000 | ORAL_TABLET | Freq: Once | ORAL | Status: AC
  Administered 2021-01-25: 2 via ORAL
  Filled 2021-01-25: qty 2

## 2021-01-25 MED ORDER — PREDNISONE 20 MG PO TABS: 20.0000 mg | ORAL_TABLET | Freq: Two times a day (BID) | ORAL | 0 refills | Status: DC

## 2021-01-25 NOTE — ED Provider Notes (Signed)
[place] DEPT Provider Note   CSN: [id number] Arrival date & time: 01/25/21  1256     History Chief Complaint  Patient presents with   Sandra [name] is a 60 y.o. female.  HPI She presents for evaluation of several falls, which have resulted in ongoing right low back pain.  She walks with a limp because of weakness of her left leg.  She does not currently use a device to help her walk.  She has chronic pain and has been taking "extra Vicodin," to help her pain.  She requests additional narcotic pain prescription.  Her pain managing Dr. June Leap her with pain control for "sciatica."  She is aware of lumbar degenerative joint disease and has "seen a chiropractor for it."  She denies new focal weakness, paresthesia, upper back pain, headache or neck pain.  There are no other known active modifying factors.    Past Medical History:  Diagnosis Date   Acute right MCA stroke ([place]) 2006   Acute R MCA stroked  with L arm and leg weakness   Anxiety 1980s   On Klonipin since age 42. Had addiction problem with Xanax which  Was therefore d/c . Seen in the past at [facility].    Arthritis 04/16/2011   Blind right eye    CAD (coronary artery disease) 2007   istory of MI with stent in 2007 by Dr. Chancy Milroy, [facility]. Stent placement by Dr Cleatis Polka    Chronic pain    Depression 03/07/2011   Fracture of fifth toe, left, closed 01/28/2011   History of left fifth toe proximal phalanx fracture when patient had a stroke (01/2011) and fell. Seen by Dr Doran Durand.     Fractured toe 01/2011   History of left fifth toe proximal phalanx fracture when patient had a stroke (01/2011) and fell. Seen by Dr Doran Durand.    Hyperlipidemia 03/07/2011   Hypertension 03/07/2011   Incontinence 04/14/2011   Myocardial infarct, old 2008   stents   PFO (patent foramen ovale) August 2012   PFO seen on TEE during hospitalization in 01/2011.  Patient to f/u with Dr. Leonie Man, neurology, for  enrollment in trial for medical treatment of PFO   Pneumonia    Prediabetes 03/07/2011   Psoriasis 03/07/2011   Substance abuse ([place])    h/o narcotic abuse pt denies as of 01/09/12   Tobacco abuse 03/07/2011   Quit 01/2011     Patient Active Problem List   Diagnosis Date Noted   Mature cataract 04/06/2020   Visual field defect 04/06/2020   Hyperopia of left eye with astigmatism and presbyopia 04/06/2020   Phthisis bulbi of right eye 04/06/2020   Colon polyps 08/21/2017   Symptomatic cholelithiasis 04/26/2017   Undiagnosed cardiac murmurs 06/24/2016   Morbid obesity ([place]) 06/05/2015   GAD (generalized anxiety disorder) 04/08/2015   Spastic hemiplegia affecting nondominant side ([place]) 04/02/2015   Benzodiazepine withdrawal with complication ([place]) 33/29/5188   Hypokalemia 12/23/2013   Cold intolerance 11/25/2013   Recurrent pneumonia 11/06/2013   MRSA bacteremia 11/06/2013   Bilateral endophthalmitis 41/66/0630   Metabolic syndrome 16/06/930   Routine adult health maintenance 08/13/2013   Dyspareunia, female 02/06/2013   Lumbar and sacral osteoarthritis 12/06/2012   Diarrhea 11/15/2012   Insomnia 07/23/2012   HIV exposure 06/27/2012   Gout 01/31/2012   Benzodiazepine dependence, continuous ([place]) 01/26/2012    Class: Chronic   Hot flashes 01/09/2012   Tobacco abuse 01/09/2012   Tobacco dependence syndrome  01/09/2012   Arthritis 04/16/2011   Anxiety 04/14/2011   Incontinence 04/14/2011   PFO (patent foramen ovale) 03/07/2011   CAD (coronary artery disease) 03/07/2011   Essential hypertension 03/07/2011   Hyperlipidemia 03/07/2011   Depression 03/07/2011   Psoriasis 03/07/2011   H/O: CVA (cerebrovascular accident) 01/27/2011    Past Surgical History:  Procedure Laterality Date   BIOPSY THYROID     CARDIAC CATHETERIZATION     cardiac stent     2008   CHOLECYSTECTOMY N/A 07/28/2017   Procedure: LAPAROSCOPIC CHOLECYSTECTOMY WITH INTRAOPERATIVE CHOLANGIOGRAM;  Surgeon:  Jovita Kussmaul, MD;  Location: [place];  Service: General;  Laterality: N/A;   EYE SURGERY     IR GENERIC HISTORICAL  03/16/2016   IR RADIOLOGIST EVAL & MGMT 03/16/2016 Marybelle Killings, MD [facility] INTERV RAD     OB History   No obstetric history on file.     Family History  Problem Relation Age of Onset   Heart disease Mother    Diabetes Mother    Heart disease Father    Diabetes Father    Cancer Sister 53       ovarian   Heart disease Brother    Diabetes Brother    Heart disease Maternal Grandmother    Heart disease Maternal Grandfather    Heart disease Paternal Grandmother    Heart disease Paternal Grandfather    Lung cancer Brother    Heart disease Brother     Social History   Tobacco Use   Smoking status: Every Day    Packs/day: 2.00    Years: 42.00    Pack years: 84.00    Types: Cigarettes   Smokeless tobacco: Never   Tobacco comments:    Trying to quit again.  Substance Use Topics   Alcohol use: No    Alcohol/week: 0.0 standard drinks   Drug use: No    Home Medications Prior to Admission medications   Medication Sig Start Date End Date Taking? Authorizing Provider  predniSONE (DELTASONE) 20 MG tablet Take 1 tablet (20 mg total) by mouth 2 (two) times daily. 01/25/21  Yes Daleen Bo, MD  allopurinol (ZYLOPRIM) 300 MG tablet TAKE 1 TABLET BY MOUTH ONCE DAILY 08/04/17   Carollee Herter, Alferd Apa, DO  amLODipine (NORVASC) 10 MG tablet Take 10 mg by mouth daily.    [provider]  amoxicillin-clavulanate (AUGMENTIN) 500-125 MG tablet Take 1 tablet (500 mg total) by mouth 3 (three) times daily. 01/04/18   Sheard, Myeong O, DPM  atropine 1 % ophthalmic solution Place 1 drop into the right eye daily.  03/20/15   [provider]  CALCIUM-VITAMIN D PO Take 1 tablet by mouth daily.    [provider]  cloNIDine (CATAPRES) 0.2 MG tablet Take 1 tablet (0.2 mg total) by mouth daily. Please schedule annual appt with Dr. Stanford Breed for refills. [phone].  1st attempt. 07/29/19   Lelon Perla, MD  clopidogrel (PLAVIX) 75 MG tablet Take 1 tablet (75 mg total) by mouth daily. 03/14/17   Ann Held, DO  clopidogrel (PLAVIX) 75 MG tablet Take by mouth.    [provider]  HYDROcodone-acetaminophen (NORCO/VICODIN) 5-325 MG tablet Take 1 tablet by mouth 2 (two) times daily as needed for moderate pain.    [provider]  HYDROcodone-acetaminophen (NORCO/VICODIN) 5-325 MG tablet Take 1-2 tablets by mouth every 6 (six) hours as needed for moderate pain or severe pain. 07/28/17   Jovita Kussmaul, MD  losartan (COZAAR) 100  MG tablet Take 1 tablet (100 mg total) by mouth daily. 01/03/17   Ann Held, DO  mesalamine (LIALDA) 1.2 g EC tablet Take 1.2 g by mouth 2 (two) times daily with a meal. Breakfast and dinner    [provider]  potassium chloride (MICRO-K) 10 MEQ CR capsule Take 2 capsules (20 mEq total) by mouth daily. 10/18/16   Ann Held, DO  prednisoLONE acetate (PRED FORTE) 1 % ophthalmic suspension Place 1 drop into the right eye 2 (two) times daily.     [provider]  rosuvastatin (CRESTOR) 40 MG tablet TAKE 1 TABLET BY MOUTH ONCE DAILY SCHEDULE  AN  APPOINTMENT  FOR  FUTURE  REFILLS 02/21/19   Lelon Perla, MD  spironolactone (ALDACTONE) 25 MG tablet TAKE 1 TABLET BY MOUTH ONCE DAILY 05/01/17   Ann Held, DO    Allergies    Azor [amlodipine-olmesartan] and Trintellix [vortioxetine]  Review of Systems   Review of Systems  All other systems reviewed and are negative.  Physical Exam Updated Vital Signs BP 135/69   Pulse (!) 49   Temp 97.7 F (36.5 C) (Oral)   Resp 18   SpO2 98%   Physical Exam Vitals and nursing note reviewed.  Constitutional:      General: She is not in acute distress.    Appearance: She is well-developed. She is obese. She is not ill-appearing or toxic-appearing.  HENT:     Head: Normocephalic and atraumatic.     Right Ear: External  ear normal.     Left Ear: External ear normal.  Eyes:     Conjunctiva/sclera: Conjunctivae normal.     Pupils: Pupils are equal, round, and reactive to light.  Neck:     Trachea: Phonation normal.  Cardiovascular:     Rate and Rhythm: Normal rate.  Pulmonary:     Effort: Pulmonary effort is normal.  Abdominal:     General: There is no distension.  Musculoskeletal:     Cervical back: Normal range of motion and neck supple.     Comments: Mild tenderness right lower lumbar.  No palpable midline lumbar tenderness.  She is able to stand from sitting position on stretcher, and ambulate to the doorway and back.  She has an antalgic gait, due to weakness on the left leg.  She was stable with walking did not require assistance  Skin:    General: Skin is warm and dry.  Neurological:     Mental Status: She is alert and oriented to person, place, and time.     Cranial Nerves: No cranial nerve deficit.     Sensory: No sensory deficit.     Motor: No abnormal muscle tone.     Coordination: Coordination normal.  Psychiatric:        Mood and Affect: Mood normal.        Behavior: Behavior normal.        Thought Content: Thought content normal.        Judgment: Judgment normal.    ED Results / Procedures / Treatments   Labs (all labs ordered are listed, but only abnormal results are displayed) Labs Reviewed - No data to display  EKG None  Radiology CT HEAD WO CONTRAST (5MM)  Result Date: 01/25/2021 CLINICAL DATA:  Multiple falls.  Trauma to the back of the head. EXAM: CT HEAD WITHOUT CONTRAST TECHNIQUE: Contiguous axial images were obtained from the base of the skull through the vertex without  intravenous contrast. COMPARISON:  MRI 09/07/2015.  CT 11/21/2013. FINDINGS: Brain: No focal abnormality affects the brainstem or cerebellum. There is old infarction affecting the right hemisphere with volume loss and gliosis as seen previously. Left cerebral hemisphere does not show any abnormality. No  evidence of recent infarction, mass, hemorrhage, hydrocephalus or extra-axial collection Vascular: There is atherosclerotic calcification of the major vessels at the base of the brain. Skull: Negative.  No skull fracture. Sinuses/Orbits: No significant sinus disease. Phthisis bulbi on the right. Other: Negative IMPRESSION: No acute traumatic finding. Chronic ischemic changes of the right cerebral hemisphere, unchanged since 2017. Electronically Signed   By: [name] Chimes M.D.   On: 01/25/2021 15:08   CT Lumbar Spine Wo Contrast  Result Date: 01/25/2021 CLINICAL DATA:  [place] 5 times in the last week.  Lumbosacral pain. EXAM: CT LUMBAR SPINE WITHOUT CONTRAST TECHNIQUE: Multidetector CT imaging of the lumbar spine was performed without intravenous contrast administration. Multiplanar CT image reconstructions were also generated. COMPARISON:  CT 06/19/2018. FINDINGS: Segmentation: 5 lumbar type vertebral bodies. Alignment: Chronic degenerative anterolisthesis at L4-5 of 4 mm. Vertebrae: Old healed compression deformity at L1. Posterior bowing of the posterosuperior margin of the vertebral body measuring 5 mm. Superior endplate fracture L4, not present in 2019, but apparently old and healed. No fracture seen in the upper sacrum as low as the S2 segment. Paraspinal and other soft tissues: No acute finding. Aortic atherosclerosis. Disc levels: At L1, there is moderate narrowing of the canal due to the fracture deformity, but this is unchanged and does not appear to cause neural compression. The disc levels at L1-2 and L2-3 are normal. L3-4: Bulging of the disc. Facet and ligamentous hypertrophy. Moderate stenosis is level that could be symptomatic. L4-5: Chronic bilateral facet arthropathy with 4 mm of anterolisthesis. Bulging of the disc. Multifactorial spinal stenosis that could cause neural compression. L5-S1: Chronic disc degeneration with vacuum phenomenon. Mild bulging of the disc. Bilateral facet arthropathy. No  central canal stenosis. Foraminal narrowing that could cause neural compression, worse on the left than the right Bilateral sacroiliac osteoarthritis is present. IMPRESSION: No acute regional fracture. Old healed L1 fracture with posterior bowing of the posterior margin of the vertebral body of 5 mm, narrowing the canal but not definitely causing neural compression, unchanged since 2019. Superior endplate fracture at L4 which was not present in 2019 but does not appear acute. Moderate multifactorial spinal stenosis at L3-4 and L4-5 as discussed above. Foraminal stenosis at L5-S1 worse on the left than the right as discussed above. Electronically Signed   By: [name] Chimes M.D.   On: 01/25/2021 15:06    Procedures Procedures   Medications Ordered in ED Medications  HYDROcodone-acetaminophen (NORCO/VICODIN) 5-325 MG per tablet 2 tablet (2 tablets Oral Given 01/25/21 1652)  predniSONE (DELTASONE) tablet 60 mg (60 mg Oral Given 01/25/21 1652)    ED Course  I have reviewed the triage vital signs and the nursing notes.  Pertinent labs & imaging results that were available during my care of the patient were reviewed by me and considered in my medical decision making (see chart for details).    MDM Rules/Calculators/A&P                            Patient Vitals for the past 24 hrs:  BP Temp Temp src Pulse Resp SpO2  01/25/21 1645 135/69 -- -- (!) 49 18 98 %  01/25/21 1643 135/69 97.7 F (  36.5 C) Oral (!) 52 15 100 %  01/25/21 1301 (!) 101/58 (!) 97.5 F (36.4 C) Oral 75 18 100 %    5:57 PM Reevaluation with update and discussion. After initial assessment and treatment, an updated evaluation reveals she reports that her pain is better.  Her sister is here now with her and findings were discussed with the patient and her sister, all questions were answered. Daleen Bo   Medical Decision Making:  This patient is presenting for evaluation of following up with back pain, which does require a range  of treatment options, and is a complaint that involves a moderate risk of morbidity and mortality. The differential diagnoses include fracture, chronic pain, progressive disability. I decided to review old records, and in summary elderly female who is obese, has had falls which are mechanical in nature, and presents with right lower back pain.  Falls have been occurring over several weeks..  I did not require additional historical information from anyone.   Radiologic Tests Ordered, included CT head, CT lumbar spine.  I independently Visualized: Radiographic images, which show degenerative joint disease lumbar, no acute injuries    Critical Interventions-clinical evaluation, CT imaging, medication treatment, observation and reassessment  After These Interventions, the Patient was reevaluated and was found improved and stable for discharge.  Likely progressive degenerative change lumbar spine contributing to both difficulty walking and discomfort after falling.  No evidence for acute fractures, cauda equina syndrome or uncontrolled pain.  Plan outpatient follow-up with her pain management doctor who follows her for "sciatica."  CRITICAL CARE-no Performed by: Daleen Bo  Nursing Notes Reviewed/ Care Coordinated Applicable Imaging Reviewed Interpretation of Laboratory Data incorporated into ED treatment  The patient appears reasonably screened and/or stabilized for discharge and I doubt any other medical condition or other [facility] requiring further screening, evaluation, or treatment in the ED at this time prior to discharge.  Plan: Home Medications-continue usual; Home Treatments-gradual advance diet and activity; return here if the recommended treatment, does not improve the symptoms; Recommended follow up-PCP as needed.  Follow-up with pain management doctor regarding taking excess chronic pain medications.     Final Clinical Impression(s) / ED Diagnoses Final diagnoses:  Fall, initial  encounter  Osteoarthritis of spine with radiculopathy, lumbar region    Rx / DC Orders ED Discharge Orders          Ordered    predniSONE (DELTASONE) 20 MG tablet  2 times daily        01/25/21 1756             Daleen Bo, MD 01/25/21 1800

## 2021-01-25 NOTE — ED Triage Notes (Signed)
Pt complains of tailbone pain and head pain since falling 5 times over the past week. No loss of consciousness. She reports falling d/t left leg weakness since stroke.

## 2021-01-25 NOTE — Discharge Instructions (Addendum)
The CAT scans show that he has significant arthritis of the lumbar spine with spinal stenosis and likely nerve impingement.  This can be aggravated by falling.  We are giving you prednisone for inflammation.  Continue use your pain medicines.  Consider using heat to the areas of your back which are uncomfortable.  Follow-up with your pain management doctor, regarding the falls and CT findings.  They may be able to offer additional interventions and treatments.

## 2021-01-25 NOTE — ED Provider Notes (Signed)
Emergency Medicine Provider Triage Evaluation Note  Sandra [name] , a 61 y.o. female  was evaluated in triage.  Pt complains of buttocks pain status post mechanical fall last week, reports falls are recurrent as she has a history of foot drop.  Currently seen by pain management, however has been out of medication and does not see her pain management doctor until Friday.  She also reports a goose egg to the posterior aspect of her head, currently on Plavix.    Review of Systems  Positive: Headache, buttock pain, leg weakness Negative: Chest pain, shortness of breath  Physical Exam  BP (!) 101/58 (BP Location: Left Arm)   Pulse 75   Temp (!) 97.5 F (36.4 C) (Oral)   Resp 18   SpO2 100%  Gen:   Awake, no distress   Resp:  Normal effort  MSK:   Moves extremities without difficulty  Other:    Medical Decision Making  Medically screening exam initiated at 2:06 PM.  Appropriate orders placed.  Sandra [name] was informed that the remainder of the evaluation will be completed by another provider, this initial triage assessment does not replace that evaluation, and the importance of remaining in the ED until their evaluation is complete.  Ct imaging order, on plavix fall last week.    Sandra Fitting, PA-C 01/25/21 1411    Jeanell Sparrow, DO 01/25/21 1745

## 2021-03-30 ENCOUNTER — Ambulatory Visit: Payer: Medicare (Managed Care) | Attending: Podiatry | Admitting: Rehabilitation

## 2021-03-30 ENCOUNTER — Other Ambulatory Visit: Payer: Self-pay

## 2021-03-30 ENCOUNTER — Encounter: Payer: Self-pay | Admitting: Rehabilitation

## 2021-03-30 DIAGNOSIS — I69354 Hemiplegia and hemiparesis following cerebral infarction affecting left non-dominant side: Secondary | ICD-10-CM

## 2021-03-30 DIAGNOSIS — M6281 Muscle weakness (generalized): Secondary | ICD-10-CM

## 2021-03-30 DIAGNOSIS — R2689 Other abnormalities of gait and mobility: Secondary | ICD-10-CM

## 2021-03-30 DIAGNOSIS — R296 Repeated falls: Secondary | ICD-10-CM

## 2021-03-30 DIAGNOSIS — R2681 Unsteadiness on feet: Secondary | ICD-10-CM | POA: Diagnosis present

## 2021-03-30 NOTE — Therapy (Signed)
[facility] [address] [place], [place], [address] Phone: [phone]   Fax:  [phone]  Physical Therapy Evaluation  Patient Details  Name: Sandra [name] MRN: [id number] Date of Birth: December 01, 1960 Referring Provider (PT): Newton Pigg, DPM   Encounter Date: 03/30/2021   PT End of Session - 03/30/21 1022     Visit Number 1    Number of Visits 9    Date for PT Re-Evaluation 05/29/21    Authorization Type [employer] (10th visit progress note)    Progress Note Due on Visit 10    PT Start Time 0925    PT Stop Time 1010    PT Time Calculation (min) 45 min    Activity Tolerance Patient tolerated treatment well    Behavior During Therapy [facility] for tasks assessed/performed             Past Medical History:  Diagnosis Date   Acute right MCA stroke ([place]) 2006   Acute R MCA stroked  with L arm and leg weakness   Anxiety 1980s   On Klonipin since age 42. Had addiction problem with Xanax which  Was therefore d/c . Seen in the past at [facility]    Arthritis 04/16/2011   Blind right eye    CAD (coronary artery disease) 2007   istory of MI with stent in 2007 by Dr. Chancy Milroy, [facility]. Stent placement by Dr Cleatis Polka    Chronic pain    Depression 03/07/2011   Fracture of fifth toe, left, closed 01/28/2011   History of left fifth toe proximal phalanx fracture when patient had a stroke (01/2011) and fell. Seen by Dr Doran Durand.     Fractured toe 01/2011   History of left fifth toe proximal phalanx fracture when patient had a stroke (01/2011) and fell. Seen by Dr Doran Durand.    Hyperlipidemia 03/07/2011   Hypertension 03/07/2011   Incontinence 04/14/2011   Myocardial infarct, old 2008   stents   PFO (patent foramen ovale) August 2012   PFO seen on TEE during hospitalization in 01/2011.  Patient to f/u with Dr. Leonie Man, neurology, for enrollment in trial for medical treatment of PFO   Pneumonia    Prediabetes 03/07/2011    Psoriasis 03/07/2011   Substance abuse ([place])    h/o narcotic abuse pt denies as of 01/09/12   Tobacco abuse 03/07/2011   Quit 01/2011     Past Surgical History:  Procedure Laterality Date   BIOPSY THYROID     CARDIAC CATHETERIZATION     cardiac stent     2008   CHOLECYSTECTOMY N/A 07/28/2017   Procedure: LAPAROSCOPIC CHOLECYSTECTOMY WITH INTRAOPERATIVE CHOLANGIOGRAM;  Surgeon: Jovita Kussmaul, MD;  Location: [facility];  Service: General;  Laterality: N/A;   EYE SURGERY     IR GENERIC HISTORICAL  03/16/2016   IR RADIOLOGIST EVAL & MGMT 03/16/2016 Marybelle Killings, MD [facility] INTERV RAD    There were no vitals filed for this visit.    Subjective Assessment - 03/30/21 0931     Subjective "My foot is turning in and pointing down.  That's from a stroke I had a long time ago.  This is affecting my balance and my walking.    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 10 mins  at most    How long can you walk comfortably? not long, "I don't walk a lot"    Patient Stated Goals "I want to be able to walk better and not fall"    Currently in Pain? Yes    Pain Score 3     Pain Location Foot    Pain Orientation Left    Pain Descriptors / Indicators Throbbing;Sore    Pain Type Chronic pain    Pain Onset More than a month ago    Pain Frequency Intermittent    Aggravating Factors  walking    Pain Relieving Factors rest, pain medicine                [facility] PT Assessment - 03/30/21 0937       Assessment   Medical Diagnosis Old CVA    Referring Provider (PT) Newton Pigg, DPM    Onset Date/Surgical Date --   CVA was in 2011??   Hand Dominance Right    Prior Therapy Had PT/OT when stroke first occurred      Precautions   Precautions Fall    Precaution Comments Getting an AFO from [facility] on  04/09/21      Balance Screen   Has the patient fallen in the past 6 months Yes    How many times? 15    Has the patient had a decrease in activity level because of a fear of falling?  Yes    Is the patient reluctant to leave their home because of a fear of falling?  Yes      [place] Private residence    Living Arrangements Alone    Available Help at Discharge Family;Available PRN/intermittently   Sister/brother, friend checks in and runs errands for her   Type of [place] to enter    Entrance Stairs-Number of Steps 2   1 then walkway, then one into apt   Entrance Stairs-Rails None    Home Layout One level    [place] - 4 wheels;Wheelchair - Agricultural consultant (washcloth to hang there)     Prior Function   Level of Independence Independent with basic ADLs;Independent with homemaking with ambulation;Independent with gait    Vocation On disability    Leisure She would like to be able to go out and shop, go to events if possible      Cognition   Overall Cognitive Status Within Functional Limits for tasks assessed      Sensation   Light Touch Appears Intact    Hot/Cold Appears Intact      Coordination   Gross Motor Movements are Fluid and Coordinated Yes    Fine Motor Movements are Fluid and Coordinated No    Heel Shin Test grossly [facility]      Tone   Assessment Location Left Lower Extremity      ROM / Strength   AROM / PROM / Strength Strength      Strength   Overall Strength Deficits    Overall Strength Comments RLE grossly 4/5, LLE hip flex 3-/5, knee ext 3/5, knee flex 3+/5, ankle DF 4/5, ankle inversion and eversion 3-/5.  In seated position, hips gross 4/5      Transfers   Transfers Sit to Stand;Stand to Sit    Sit to Stand 4: Min guard    Stand to Sit 4: Min guard    Comments Needs cues  and assistance for sit<>stand esp from lower surface in treatment room.      Ambulation/Gait    Ambulation/Gait Yes    Ambulation/Gait Assistance 4: Min assist    Ambulation/Gait Assistance Details Pt requires L HHA throughout gait due to unsteadiness and tendency of L ankle to invert during gait causing pain and imbalance.    Ambulation Distance (Feet) 80 Feet   x 2 reps, 20'   Assistive device 1 person hand held assist    Gait Pattern Step-through pattern;Decreased stance time - left;Decreased step length - right;Decreased stride length;Decreased weight shift to left;Antalgic;Lateral trunk lean to right;Trunk flexed    Ambulation Surface Level;Indoor    Gait velocity 1.62 ft/sec with HHA (min A)    Stairs Yes    Stairs Assistance 4: Min assist    Stairs Assistance Details (indicate cue type and reason) Min A for L HHA and heavy cues for sequencing and technique.    Stair Management Technique One rail Right;Step to pattern;Forwards    Number of Stairs 4    Height of Stairs 6      Standardized Balance Assessment   Standardized Balance Assessment Timed Up and Go Test      Timed Up and Go Test   TUG Normal TUG    Normal TUG (seconds) 25.63   with HHA     LLE Tone   LLE Tone Mild                        Objective measurements completed on examination: See above findings.                PT Education - 03/30/21 1021     Education Details Education on evaluation results, POC, goals.  Need for AFO for improved safety and ankle positioning.  PT did educate on benefits of ankle support brace (ASO) until AFO is ready for pain management (just gave the option, not required)    Person(s) Educated Patient;Other (comment)   sister susan   Methods Explanation;Demonstration    Comprehension Verbalized understanding;Returned demonstration              PT Short Term Goals - 03/30/21 1203       PT SHORT TERM GOAL #1   Title Pt will initiate HEP in order to indicate improved functional mobility and dec fall risk.  (Target Date: 04/29/21)    Time 4    Period  Weeks    Status New    Target Date 04/29/21      PT SHORT TERM GOAL #2   Title Pt will perform BERG balance test and improve score 4 points from baseline in order to indicate dec fall risk.    Time 4    Period Weeks    Status New      PT SHORT TERM GOAL #3   Title Pt will ambulate x 150' with AFO (with device if needed) at mod I level in order to indicate safe household ambulation.    Time 4    Period Weeks    Status New      PT SHORT TERM GOAL #4   Title Pt will improve gait speed to >/=1.8 ft/sec with AFO and device (if needed) in order to indicate dec fall risk.    Time 4    Period Weeks    Status New      PT SHORT TERM GOAL #5   Title Pt will improve TUG to </=21  secs with AFO (device if needed) at S level in order to indicate dec fall risk.    Time 4    Period Weeks    Status New               PT Long Term Goals - 03/30/21 1211       PT LONG TERM GOAL #1   Title Pt will be IND with final HEP in order to indicate dec fall risk and improved functional mobility.  (Target Date:05/29/21)    Time 8    Period Weeks    Status New    Target Date 05/29/21      PT LONG TERM GOAL #2   Title Pt will improve BERG balance score by 8 points from baseline in order to dec fall risk.    Time 8    Period Weeks    Status New      PT LONG TERM GOAL #3   Title Pt will ambulate x 500' with AFO/device (if needed) at S level over level indoor and unlevel paved outdoor surfaces in order to indicate return to community activity with family.    Time 8    Period Weeks    Status New      PT LONG TERM GOAL #4   Title Pt will improve gait speed to >/=2.2 ft/sec w/ AFO and device (if needed)  in order to indicate dec fall risk.    Time 8    Period Weeks    Status New      PT LONG TERM GOAL #5   Title Pt will improve TUG to </=16 secs with AFO/device as needed at mod I level in order to indicate dec fall risk.    Time 8    Period Weeks    Status New                     Plan - 03/30/21 1150     Clinical Impression Statement Pt is pleasant 60 y/o with history of CVA approx 10-12 years ago with residual L hemiparesis with impaired balance, activity tolerance and overall weakness.  Note history of HTN, HLD, 3rd nerve palsy with R eye blindness, MI and depression.  She does report that she has been to [facility] and they have molded her for custom AFO that she should receive on 10/21.  Upon PT evaluation, note that she does have tendency to turn L ankle into inversion and slight plantar flexion causing imbalance and increased pain.  Gait speed is 1.62 ft/sec with HHA indicative of high fall risk and TUG time of 25.63 secs with HHA also indicative of high fall risk.  Feel that pt will be limited in standing/gait activity until she receives AFO.  PT did recommend an ASO if they are able to for pain and some safety to keep ankle supported, otherwise will just do more seated/LLE WB activity until she get AFO.  Pt will benefit from skilled OP PT in order to address deficits.    Personal Factors and Comorbidities Comorbidity 3+;Time since onset of injury/illness/exacerbation    Comorbidities see above    Examination-Activity Limitations Locomotion Level;Squat;Lift;Stairs;Stand;Transfers    Examination-Participation Restrictions Community Activity;Driving;Cleaning    Stability/Clinical Decision Making Evolving/Moderate complexity    Clinical Decision Making Moderate    Rehab Potential Fair    PT Frequency 2x / week   recommended 2x however she will only be seen once a week   PT Duration 8 weeks  PT Treatment/Interventions ADLs/Self Care Home Management;Aquatic Therapy;Electrical Stimulation;DME Instruction;Gait training;Stair training;Functional mobility training;Therapeutic activities;Therapeutic exercise;Balance training;Neuromuscular re-education;Patient/family education;Orthotic Fit/Training;Passive range of motion;Vestibular    PT Next Visit Plan BERG  (update goal), initiate HEP for sit<>stand, hamstring flexibility/PF flexibility, seated therex and maybe some L WB tasks in standing until she gets her AFO    Recommended Other Services OT-PT to send note to MD for order    Consulted and Agree with Plan of Care Patient;Family member/caregiver    Family Member Consulted sister Sandra [name]             Patient will benefit from skilled therapeutic intervention in order to improve the following deficits and impairments:  Abnormal gait, Decreased activity tolerance, Decreased balance, Decreased endurance, Decreased mobility, Decreased range of motion, Decreased strength, Impaired perceived functional ability, Impaired flexibility, Impaired UE functional use, Postural dysfunction, Pain (pain will likely be addressed through bracing)  Visit Diagnosis: Hemiplegia and hemiparesis following cerebral infarction affecting left non-dominant side (HCC)  Unsteadiness on feet  Muscle weakness (generalized)  Repeated falls  Other abnormalities of gait and mobility     Problem List Patient Active Problem List   Diagnosis Date Noted   Mature cataract 04/06/2020   Visual field defect 04/06/2020   Hyperopia of left eye with astigmatism and presbyopia 04/06/2020   Phthisis bulbi of right eye 04/06/2020   Colon polyps 08/21/2017   Symptomatic cholelithiasis 04/26/2017   Undiagnosed cardiac murmurs 06/24/2016   Morbid obesity ([place]) 06/05/2015   GAD (generalized anxiety disorder) 04/08/2015   Spastic hemiplegia affecting nondominant side ([place]) 04/02/2015   Benzodiazepine withdrawal with complication ([place]) 41/63/8453   Hypokalemia 12/23/2013   Cold intolerance 11/25/2013   Recurrent pneumonia 11/06/2013   MRSA bacteremia 11/06/2013   Bilateral endophthalmitis 64/68/0321   Metabolic syndrome 22/48/2500   Routine adult health maintenance 08/13/2013   Dyspareunia, female 02/06/2013   Lumbar and sacral osteoarthritis 12/06/2012   Diarrhea 11/15/2012    Insomnia 07/23/2012   HIV exposure 06/27/2012   Gout 01/31/2012   Benzodiazepine dependence, continuous ([place]) 01/26/2012    Class: Chronic   Hot flashes 01/09/2012   Tobacco abuse 01/09/2012   Tobacco dependence syndrome 01/09/2012   Arthritis 04/16/2011   Anxiety 04/14/2011   Incontinence 04/14/2011   PFO (patent foramen ovale) 03/07/2011   CAD (coronary artery disease) 03/07/2011   Essential hypertension 03/07/2011   Hyperlipidemia 03/07/2011   Depression 03/07/2011   Psoriasis 03/07/2011   H/O: CVA (cerebrovascular accident) 01/27/2011    Cameron Sprang, PT, MPT [facility] [address] [place], [place], [address] Phone: [phone]   Fax:  [phone] 03/30/21, 12:16 PM   Name: Kennya Schwenn MRN: [id number] Date of Birth: [birth year]   Physician: Newton Pigg, DPM  Certification Start Date: 79/48/01 Certification End Date: 05/29/21  Physician Documentation Your signature is required to indicate approval of the treatment plan as stated above.  Please sign and either send electronically or make a copy of this report for your files and return this physician signed original.  Please mark one 1.__approve of plan   2. ___approve of plan with the followingconditions. ____________________________________________________________________________________________________________________________________________   ______________________                                                       _____________________ Physician Signature  Date    Faxed to MD for signature

## 2021-04-07 ENCOUNTER — Ambulatory Visit: Payer: Medicare (Managed Care) | Admitting: Rehabilitation

## 2021-04-07 ENCOUNTER — Encounter: Payer: Self-pay | Admitting: Rehabilitation

## 2021-04-07 ENCOUNTER — Other Ambulatory Visit: Payer: Self-pay

## 2021-04-07 DIAGNOSIS — I69354 Hemiplegia and hemiparesis following cerebral infarction affecting left non-dominant side: Secondary | ICD-10-CM | POA: Diagnosis not present

## 2021-04-07 DIAGNOSIS — M6281 Muscle weakness (generalized): Secondary | ICD-10-CM

## 2021-04-07 DIAGNOSIS — R296 Repeated falls: Secondary | ICD-10-CM

## 2021-04-07 DIAGNOSIS — R2681 Unsteadiness on feet: Secondary | ICD-10-CM

## 2021-04-07 DIAGNOSIS — R2689 Other abnormalities of gait and mobility: Secondary | ICD-10-CM

## 2021-04-07 NOTE — Therapy (Signed)
[facility] [address] [place], [place], [address] Phone: [phone]   Fax:  [phone]  Physical Therapy Treatment  Patient Details  Name: Sandra [name] MRN: [id number] Date of Birth: 1961/03/03 Referring Provider (PT): Newton Pigg, DPM   Encounter Date: 04/07/2021   PT End of Session - 04/07/21 1332     Visit Number 2    Number of Visits 9    Date for PT Re-Evaluation 05/29/21    Authorization Type [employer] (10th visit progress note)    Progress Note Due on Visit 10    PT Start Time 0933    PT Stop Time 1015    PT Time Calculation (min) 42 min    Activity Tolerance Patient tolerated treatment well    Behavior During Therapy [facility] for tasks assessed/performed             Past Medical History:  Diagnosis Date   Acute right MCA stroke ([place]) 2006   Acute R MCA stroked  with L arm and leg weakness   Anxiety 1980s   On Klonipin since age 66. Had addiction problem with Xanax which  Was therefore d/c . Seen in the past at [facility].    Arthritis 04/16/2011   Blind right eye    CAD (coronary artery disease) 2007   istory of MI with stent in 2007 by Dr. Chancy Milroy, [facility]. Stent placement by Dr Cleatis Polka    Chronic pain    Depression 03/07/2011   Fracture of fifth toe, left, closed 01/28/2011   History of left fifth toe proximal phalanx fracture when patient had a stroke (01/2011) and fell. Seen by Dr Doran Durand.     Fractured toe 01/2011   History of left fifth toe proximal phalanx fracture when patient had a stroke (01/2011) and fell. Seen by Dr Doran Durand.    Hyperlipidemia 03/07/2011   Hypertension 03/07/2011   Incontinence 04/14/2011   Myocardial infarct, old 2008   stents   PFO (patent foramen ovale) August 2012   PFO seen on TEE during hospitalization in 01/2011.  Patient to f/u with Dr. Leonie Man, neurology, for enrollment in trial for medical treatment of PFO   Pneumonia    Prediabetes 03/07/2011    Psoriasis 03/07/2011   Substance abuse ([place])    h/o narcotic abuse pt denies as of 01/09/12   Tobacco abuse 03/07/2011   Quit 01/2011     Past Surgical History:  Procedure Laterality Date   BIOPSY THYROID     CARDIAC CATHETERIZATION     cardiac stent     2008   CHOLECYSTECTOMY N/A 07/28/2017   Procedure: LAPAROSCOPIC CHOLECYSTECTOMY WITH INTRAOPERATIVE CHOLANGIOGRAM;  Surgeon: Jovita Kussmaul, MD;  Location: [facility];  Service: General;  Laterality: N/A;   EYE SURGERY     IR GENERIC HISTORICAL  03/16/2016   IR RADIOLOGIST EVAL & MGMT 03/16/2016 Marybelle Killings, MD [facility] INTERV RAD    There were no vitals filed for this visit.   Subjective Assessment - 04/07/21 0942     Subjective My foot is doing better today.  No other changes, no falls.    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 10 mins at most    How long can you walk comfortably? not long, "I don't walk a lot"    Patient Stated Goals "I want to be able  to walk better and not fall"    Currently in Pain? Yes    Pain Score 4     Pain Location Back    Pain Orientation Mid    Pain Descriptors / Indicators Sore    Pain Type Chronic pain    Pain Onset More than a month ago    Pain Frequency Intermittent    Aggravating Factors  walking    Pain Relieving Factors rest, pain medication                               [facility] Adult PT Treatment/Exercise - 04/07/21 0944       Transfers   Transfers Sit to Stand;Stand to Sit    Sit to Stand 5: Supervision    Stand to Sit 5: Supervision    Comments Performed x 10 reps during session for strengthening and LLE WB, added to HEP      Ambulation/Gait   Ambulation/Gait Yes    Ambulation/Gait Assistance 4: Min assist    Ambulation/Gait Assistance Details Continues to require L HHA throughout gait but did note that foot was not inverting  and plantar flexing as much as previous session.  She reports she notes the same. She gets her AFO Friday so will have at next session for further gait training.    Ambulation Distance (Feet) 85 Feet   x 2   Assistive device 1 person hand held assist    Gait Pattern Step-through pattern;Decreased stance time - left;Decreased step length - right;Decreased stride length;Decreased weight shift to left;Antalgic;Lateral trunk lean to right;Trunk flexed    Ambulation Surface Level;Indoor      Exercises   Exercises Knee/Hip      Knee/Hip Exercises: Stretches   Gastroc Stretch Left;30 seconds;Other (comment)    Gastroc Stretch Limitations Standing, runner's stretch.  Performed x 2 reps, added to HEP      Knee/Hip Exercises: Aerobic   Stepper Scitfit steper with BUEs/LEs at level 2 resistance x 8 mins maintaining rpms in 60-70's. Tolerated well      Knee/Hip Exercises: Standing   Hip Abduction Stengthening;Right;Left;1 set;10 reps    Abduction Limitations Performed for LLE WB/loading and for proximal activation, but educated that she could perform on both sides.               Access Code: [email or web address] URL: [email or web address] Date: 04/07/2021 Prepared by: Cameron Sprang   Exercises Standing Gastroc Stretch - 2 x daily - 7 x weekly - 1 sets - 3 reps - 30 secs hold Sit to Stand Without Arm Support - 2 x daily - 7 x weekly - 2 sets - 10 reps Standing Hip Abduction with Counter Support - 2 x daily - 7 x weekly - 2 sets - 10 reps      PT Education - 04/07/21 1332     Education Details HEP    Person(s) Educated Patient    Methods Explanation;Demonstration;Handout    Comprehension Verbalized understanding;Need further instruction              PT Short Term Goals - 03/30/21 1203       PT SHORT TERM GOAL #1   Title Pt will initiate HEP in order to indicate improved functional mobility and dec fall risk.  (Target Date: 04/29/21)    Time 4    Period Weeks    Status  New    Target Date 04/29/21  PT SHORT TERM GOAL #2   Title Pt will perform BERG balance test and improve score 4 points from baseline in order to indicate dec fall risk.    Time 4    Period Weeks    Status New      PT SHORT TERM GOAL #3   Title Pt will ambulate x 150' with AFO (with device if needed) at mod I level in order to indicate safe household ambulation.    Time 4    Period Weeks    Status New      PT SHORT TERM GOAL #4   Title Pt will improve gait speed to >/=1.8 ft/sec with AFO and device (if needed) in order to indicate dec fall risk.    Time 4    Period Weeks    Status New      PT SHORT TERM GOAL #5   Title Pt will improve TUG to </=21 secs with AFO (device if needed) at S level in order to indicate dec fall risk.    Time 4    Period Weeks    Status New               PT Long Term Goals - 03/30/21 1211       PT LONG TERM GOAL #1   Title Pt will be IND with final HEP in order to indicate dec fall risk and improved functional mobility.  (Target Date:05/29/21)    Time 8    Period Weeks    Status New    Target Date 05/29/21      PT LONG TERM GOAL #2   Title Pt will improve BERG balance score by 8 points from baseline in order to dec fall risk.    Time 8    Period Weeks    Status New      PT LONG TERM GOAL #3   Title Pt will ambulate x 500' with AFO/device (if needed) at S level over level indoor and unlevel paved outdoor surfaces in order to indicate return to community activity with family.    Time 8    Period Weeks    Status New      PT LONG TERM GOAL #4   Title Pt will improve gait speed to >/=2.2 ft/sec w/ AFO and device (if needed)  in order to indicate dec fall risk.    Time 8    Period Weeks    Status New      PT LONG TERM GOAL #5   Title Pt will improve TUG to </=16 secs with AFO/device as needed at mod I level in order to indicate dec fall risk.    Time 8    Period Weeks    Status New                   Plan - 04/07/21  1333     Clinical Impression Statement Skilled session focused on seated therex (scifit) to improve strength, ROM and cardiovascular endurance along with establishing an HEP for improved strength and LLE flexibility since getting AFO Friday.  Pt with moderate fatigue, but did well with rest breaks.    Personal Factors and Comorbidities Comorbidity 3+;Time since onset of injury/illness/exacerbation    Comorbidities see above    Examination-Activity Limitations Locomotion Level;Squat;Lift;Stairs;Stand;Transfers    Examination-Participation Restrictions Community Activity;Driving;Cleaning    Stability/Clinical Decision Making Evolving/Moderate complexity    Rehab Potential Fair    PT Frequency 2x / week   recommended  2x however she will only be seen once a week   PT Duration 8 weeks    PT Treatment/Interventions ADLs/Self Care Home Management;Aquatic Therapy;Electrical Stimulation;DME Instruction;Gait training;Stair training;Functional mobility training;Therapeutic activities;Therapeutic exercise;Balance training;Neuromuscular re-education;Patient/family education;Orthotic Fit/Training;Passive range of motion;Vestibular    PT Next Visit Plan BERG with brace (update goal), how is HEP going-add more standing as able since she will have AFO, work on gait with AFO/add device if needed    Consulted and Agree with Plan of Care Patient;Family member/caregiver    Family Member Consulted sister [name]             Patient will benefit from skilled therapeutic intervention in order to improve the following deficits and impairments:  Abnormal gait, Decreased activity tolerance, Decreased balance, Decreased endurance, Decreased mobility, Decreased range of motion, Decreased strength, Impaired perceived functional ability, Impaired flexibility, Impaired UE functional use, Postural dysfunction, Pain (pain will likely be addressed through bracing)  Visit Diagnosis: Hemiplegia and hemiparesis following cerebral  infarction affecting left non-dominant side (HCC)  Unsteadiness on feet  Muscle weakness (generalized)  Repeated falls  Other abnormalities of gait and mobility     Problem List Patient Active Problem List   Diagnosis Date Noted   Mature cataract 04/06/2020   Visual field defect 04/06/2020   Hyperopia of left eye with astigmatism and presbyopia 04/06/2020   Phthisis bulbi of right eye 04/06/2020   Colon polyps 08/21/2017   Symptomatic cholelithiasis 04/26/2017   Undiagnosed cardiac murmurs 06/24/2016   Morbid obesity ([place]) 06/05/2015   GAD (generalized anxiety disorder) 04/08/2015   Spastic hemiplegia affecting nondominant side ([place]) 04/02/2015   Benzodiazepine withdrawal with complication ([place]) 75/03/2584   Hypokalemia 12/23/2013   Cold intolerance 11/25/2013   Recurrent pneumonia 11/06/2013   MRSA bacteremia 11/06/2013   Bilateral endophthalmitis 27/78/2423   Metabolic syndrome 53/61/4431   Routine adult health maintenance 08/13/2013   Dyspareunia, female 02/06/2013   Lumbar and sacral osteoarthritis 12/06/2012   Diarrhea 11/15/2012   Insomnia 07/23/2012   HIV exposure 06/27/2012   Gout 01/31/2012   Benzodiazepine dependence, continuous ([place]) 01/26/2012    Class: Chronic   Hot flashes 01/09/2012   Tobacco abuse 01/09/2012   Tobacco dependence syndrome 01/09/2012   Arthritis 04/16/2011   Anxiety 04/14/2011   Incontinence 04/14/2011   PFO (patent foramen ovale) 03/07/2011   CAD (coronary artery disease) 03/07/2011   Essential hypertension 03/07/2011   Hyperlipidemia 03/07/2011   Depression 03/07/2011   Psoriasis 03/07/2011   H/O: CVA (cerebrovascular accident) 01/27/2011    Cameron Sprang, PT, MPT [facility] [address] [place], [place], [address] Phone: [phone]   Fax:  [phone] 04/07/21, 1:36 PM   Name: Sandra [name] MRN: [id number] Date of Birth: 08-09-1960

## 2021-04-07 NOTE — Patient Instructions (Signed)
Access Code: [email or web address] URL: [email or web address] Date: 04/07/2021 Prepared by: Cameron Sprang  Exercises Standing Gastroc Stretch - 2 x daily - 7 x weekly - 1 sets - 3 reps - 30 secs hold Sit to Stand Without Arm Support - 2 x daily - 7 x weekly - 2 sets - 10 reps Standing Hip Abduction with Counter Support - 2 x daily - 7 x weekly - 2 sets - 10 reps

## 2021-04-13 ENCOUNTER — Ambulatory Visit: Payer: Medicare (Managed Care) | Admitting: Rehabilitation

## 2021-04-13 ENCOUNTER — Other Ambulatory Visit: Payer: Self-pay

## 2021-04-20 ENCOUNTER — Ambulatory Visit: Payer: Medicare (Managed Care) | Attending: Podiatry

## 2021-04-20 ENCOUNTER — Telehealth: Payer: Self-pay

## 2021-04-20 ENCOUNTER — Other Ambulatory Visit: Payer: Self-pay

## 2021-04-20 DIAGNOSIS — R2689 Other abnormalities of gait and mobility: Secondary | ICD-10-CM | POA: Insufficient documentation

## 2021-04-20 DIAGNOSIS — M6281 Muscle weakness (generalized): Secondary | ICD-10-CM

## 2021-04-20 DIAGNOSIS — I69354 Hemiplegia and hemiparesis following cerebral infarction affecting left non-dominant side: Secondary | ICD-10-CM | POA: Insufficient documentation

## 2021-04-20 DIAGNOSIS — R2681 Unsteadiness on feet: Secondary | ICD-10-CM | POA: Diagnosis present

## 2021-04-20 DIAGNOSIS — R296 Repeated falls: Secondary | ICD-10-CM | POA: Diagnosis present

## 2021-04-20 NOTE — Therapy (Signed)
[facility] [address] [place], [place], [address] Phone: [phone]   Fax:  [phone]  Physical Therapy Treatment  Patient Details  Name: Jonita Hirota MRN: [id number] Date of Birth: 1960-08-25 Referring Provider (PT): Newton Pigg, DPM   Encounter Date: 04/20/2021   PT End of Session - 04/20/21 1410     Visit Number 3    Number of Visits 9    Date for PT Re-Evaluation 05/29/21    Authorization Type [employer] Medicare (10th visit progress note)    Progress Note Due on Visit 10    PT Start Time 1408    PT Stop Time 1448    PT Time Calculation (min) 40 min    Activity Tolerance Patient tolerated treatment well    Behavior During Therapy [facility] for tasks assessed/performed             Past Medical History:  Diagnosis Date   Acute right MCA stroke ([place]) 2006   Acute R MCA stroked  with L arm and leg weakness   Anxiety 60   On Klonipin since age 60. Had addiction problem with Xanax which  Was therefore d/c . Seen in the past at [facility].    Arthritis 04/16/2011   Blind right eye    CAD (coronary artery disease) 2007   istory of MI with stent in 2007 by Dr. Chancy Milroy, [facility]. Stent placement by Dr Cleatis Polka    Chronic pain    Depression 03/07/2011   Fracture of fifth toe, left, closed 01/28/2011   History of left fifth toe proximal phalanx fracture when patient had a stroke (01/2011) and fell. Seen by Dr Doran Durand.     Fractured toe 01/2011   History of left fifth toe proximal phalanx fracture when patient had a stroke (01/2011) and fell. Seen by Dr Doran Durand.    Hyperlipidemia 03/07/2011   Hypertension 03/07/2011   Incontinence 04/14/2011   Myocardial infarct, old 2008   stents   PFO (patent foramen ovale) August 2012   PFO seen on TEE during hospitalization in 01/2011.  Patient to f/u with Dr. Leonie Man, neurology, for enrollment in trial for medical treatment of PFO   Pneumonia    Prediabetes 03/07/2011    Psoriasis 03/07/2011   Substance abuse ([place])    h/o narcotic abuse pt denies as of 01/09/12   Tobacco abuse 03/07/2011   Quit 01/2011     Past Surgical History:  Procedure Laterality Date   BIOPSY THYROID     CARDIAC CATHETERIZATION     cardiac stent     2008   CHOLECYSTECTOMY N/A 07/28/2017   Procedure: LAPAROSCOPIC CHOLECYSTECTOMY WITH INTRAOPERATIVE CHOLANGIOGRAM;  Surgeon: Jovita Kussmaul, MD;  Location: [facility];  Service: General;  Laterality: N/A;   EYE SURGERY     IR GENERIC HISTORICAL  03/16/2016   IR RADIOLOGIST EVAL & MGMT 03/16/2016 Marybelle Killings, MD [facility] INTERV RAD    There were no vitals filed for this visit.   Subjective Assessment - 04/20/21 1411     Subjective Pt reports that she got her AFO last Friday. She reports that it is hard to put on. She came in to clinic on walker.    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 10 mins at most    How long can you walk comfortably? not long, "I don't walk  a lot"    Patient Stated Goals "I want to be able to walk better and not fall"    Currently in Pain? Yes    Pain Score 4     Pain Location Back    Pain Orientation Right    Pain Descriptors / Indicators Sore    Pain Type Chronic pain    Pain Onset More than a month ago    Pain Frequency Intermittent                               [facility] Adult PT Treatment/Exercise - 04/20/21 1412       Transfers   Transfers Sit to Stand;Stand to Sit    Sit to Stand 5: Supervision;With upper extremity assist    Stand to Sit 5: Supervision      Ambulation/Gait   Ambulation/Gait Yes    Ambulation/Gait Assistance 4: Min guard    Ambulation/Gait Assistance Details With new AFO: initially trialed HHA x 20' with pt not weight shifting over LLE as much. Switched to walker with verbal cues to stay up in walker and increase step length.  Pt with better left weight shift with walker.    Ambulation Distance (Feet) 20 Feet   75' x 1   Assistive device Rolling walker   left AFO   Gait Pattern Step-through pattern;Decreased step length - right;Decreased step length - left;Decreased stance time - left;Decreased weight shift to left;Decreased hip/knee flexion - left    Ambulation Surface Level;Indoor    Gait Comments Pt has new left semisolid AFO with foot contoured to support forefoot. Worked on donning. Suggested putting brace on first and then donning shoe. May need strap added in front to hold foot down to help. Pt was able to demonstrate donning this way with crossing left leg over knee. Also suggested shoe horn could be helpful.      Standardized Balance Assessment   Standardized Balance Assessment Berg Balance Test      Berg Balance Test   Sit to Stand Able to stand  independently using hands    Standing Unsupported Able to stand safely 2 minutes    Sitting with Back Unsupported but Feet Supported on Floor or Stool Able to sit safely and securely 2 minutes    Stand to Sit Controls descent by using hands    Transfers Able to transfer safely, definite need of hands    Standing Unsupported with Eyes Closed Able to stand 10 seconds safely    Standing Ubsupported with Feet Together Able to place feet together independently and stand 1 minute safely    From Standing, Reach Forward with Outstretched Arm Can reach forward >12 cm safely (5")    From Standing Position, Pick up Object from Floor Able to pick up shoe, needs supervision    From Standing Position, Turn to Look Behind Over each Shoulder Turn sideways only but maintains balance    Turn 360 Degrees Needs assistance while turning    Standing Unsupported, Alternately Place Feet on Step/Stool Able to complete >2 steps/needs minimal assist    Standing Unsupported, One Foot in Front Able to take small step independently and hold 30 seconds    Standing on One Leg Tries to lift  leg/unable to hold 3 seconds but remains standing independently    Total Score 37  PT Education - 04/20/21 1943     Education Details Donning AFO. Results of Berg.    Person(s) Educated Patient    Methods Explanation    Comprehension Verbalized understanding              PT Short Term Goals - 04/20/21 1944       PT SHORT TERM GOAL #1   Title Pt will initiate HEP in order to indicate improved functional mobility and dec fall risk.  (Target Date: 04/29/21)    Time 4    Period Weeks    Status New    Target Date 04/29/21      PT SHORT TERM GOAL #2   Title Pt will perform BERG balance test and improve score 4 points from baseline in order to indicate dec fall risk.    Baseline 04/20/21 37/56    Time 4    Period Weeks    Status New      PT SHORT TERM GOAL #3   Title Pt will ambulate x 150' with AFO (with device if needed) at mod I level in order to indicate safe household ambulation.    Time 4    Period Weeks    Status New      PT SHORT TERM GOAL #4   Title Pt will improve gait speed to >/=1.8 ft/sec with AFO and device (if needed) in order to indicate dec fall risk.    Time 4    Period Weeks    Status New      PT SHORT TERM GOAL #5   Title Pt will improve TUG to </=21 secs with AFO (device if needed) at S level in order to indicate dec fall risk.    Time 4    Period Weeks    Status New               PT Long Term Goals - 04/20/21 1945       PT LONG TERM GOAL #1   Title Pt will be IND with final HEP in order to indicate dec fall risk and improved functional mobility.  (Target Date:05/29/21)    Time 8    Period Weeks    Status New      PT LONG TERM GOAL #2   Title Pt will improve BERG balance score by 8 points from baseline in order to dec fall risk.    Baseline 04/20/21 37/56    Time 8    Period Weeks    Status New      PT LONG TERM GOAL #3   Title Pt will ambulate x 500' with AFO/device (if needed) at S level over  level indoor and unlevel paved outdoor surfaces in order to indicate return to community activity with family.    Time 8    Period Weeks    Status New      PT LONG TERM GOAL #4   Title Pt will improve gait speed to >/=2.2 ft/sec w/ AFO and device (if needed)  in order to indicate dec fall risk.    Time 8    Period Weeks    Status New      PT LONG TERM GOAL #5   Title Pt will improve TUG to </=16 secs with AFO/device as needed at mod I level in order to indicate dec fall risk.    Time 8    Period Weeks    Status New  Plan - 04/20/21 1434     Clinical Impression Statement PT focused on how to donn new AFO easier and initiated gait training with RW that pt had just received. Pt scored 37/56 on Berg indicating high fall risk. PT updated goals.    Personal Factors and Comorbidities Comorbidity 3+;Time since onset of injury/illness/exacerbation    Comorbidities see above    Examination-Activity Limitations Locomotion Level;Squat;Lift;Stairs;Stand;Transfers    Examination-Participation Restrictions Community Activity;Driving;Cleaning    Stability/Clinical Decision Making Evolving/Moderate complexity    Rehab Potential Fair    PT Frequency 2x / week   recommended 2x however she will only be seen once a week   PT Duration 8 weeks    PT Treatment/Interventions ADLs/Self Care Home Management;Aquatic Therapy;Electrical Stimulation;DME Instruction;Gait training;Stair training;Functional mobility training;Therapeutic activities;Therapeutic exercise;Balance training;Neuromuscular re-education;Patient/family education;Orthotic Fit/Training;Passive range of motion;Vestibular    PT Next Visit Plan Is she having better time donning AFO? how is HEP going-add more standing as able since she has AFO, work on gait with AFO and walker.    Recommended Other Services PT sending request to PCP for OT order. Will reach out to orthotist Anderson Malta to see if strap can be added to front of  ankle on AFO to help hold foot in place to ease donning.    Consulted and Agree with Plan of Care Patient    Family Member Consulted --             Patient will benefit from skilled therapeutic intervention in order to improve the following deficits and impairments:  Abnormal gait, Decreased activity tolerance, Decreased balance, Decreased endurance, Decreased mobility, Decreased range of motion, Decreased strength, Impaired perceived functional ability, Impaired flexibility, Impaired UE functional use, Postural dysfunction, Pain (pain will likely be addressed through bracing)  Visit Diagnosis: Other abnormalities of gait and mobility  Muscle weakness (generalized)  Unsteadiness on feet     Problem List Patient Active Problem List   Diagnosis Date Noted   Mature cataract 04/06/2020   Visual field defect 04/06/2020   Hyperopia of left eye with astigmatism and presbyopia 04/06/2020   Phthisis bulbi of right eye 04/06/2020   Colon polyps 08/21/2017   Symptomatic cholelithiasis 04/26/2017   Undiagnosed cardiac murmurs 06/24/2016   Morbid obesity ([place]) 06/05/2015   GAD (generalized anxiety disorder) 04/08/2015   Spastic hemiplegia affecting nondominant side ([place]) 04/02/2015   Benzodiazepine withdrawal with complication ([place]) 16/38/4536   Hypokalemia 12/23/2013   Cold intolerance 11/25/2013   Recurrent pneumonia 11/06/2013   MRSA bacteremia 11/06/2013   Bilateral endophthalmitis 46/80/3212   Metabolic syndrome 24/82/5003   Routine adult health maintenance 08/13/2013   Dyspareunia, female 02/06/2013   Lumbar and sacral osteoarthritis 12/06/2012   Diarrhea 11/15/2012   Insomnia 07/23/2012   HIV exposure 06/27/2012   Gout 01/31/2012   Benzodiazepine dependence, continuous ([place]) 01/26/2012    Class: Chronic   Hot flashes 01/09/2012   Tobacco abuse 01/09/2012   Tobacco dependence syndrome 01/09/2012   Arthritis 04/16/2011   Anxiety 04/14/2011   Incontinence 04/14/2011    PFO (patent foramen ovale) 03/07/2011   CAD (coronary artery disease) 03/07/2011   Essential hypertension 03/07/2011   Hyperlipidemia 03/07/2011   Depression 03/07/2011   Psoriasis 03/07/2011   H/O: CVA (cerebrovascular accident) 01/27/2011    Electa Sniff, PT, DPT, NCS 04/20/2021, 7:50 PM  [facility] [address] [place], [place], [address] Phone: [phone]   Fax:  [phone]  Name: Jahnae Mcadoo MRN: [id number] Date of Birth: 01-13-1961

## 2021-04-20 NOTE — Telephone Encounter (Signed)
The following was faxed to Dr. Beryl Meager:  Dr. Beryl Meager, Sandra [name], dob 10/18/1960 was evaluated by PT on 03/30/21.  The patient would benefit from OT evaluation for left hand weakness with history of stroke.   If you agree, please place an order in [facility] workque in [facility] or fax the order to [phone]. Thank you, Cherly Anderson, PT, DPT, [place] [address] [place], [place]  [address] Phone:  [phone] Fax:  [phone]

## 2021-04-21 ENCOUNTER — Telehealth: Payer: Self-pay

## 2021-04-21 NOTE — Telephone Encounter (Signed)
PT called Anderson Malta at [facility] [phone]) to see if a strap could be added to front of ankle on AFO to improve donning ability. She reports that is an easy fix and will call pt back about this. Cherly Anderson, PT, DPT, NCS

## 2021-04-26 ENCOUNTER — Ambulatory Visit: Payer: Medicare (Managed Care) | Admitting: Physical Therapy

## 2021-04-26 ENCOUNTER — Other Ambulatory Visit: Payer: Self-pay

## 2021-04-26 ENCOUNTER — Encounter: Payer: Self-pay | Admitting: Physical Therapy

## 2021-04-26 DIAGNOSIS — R2689 Other abnormalities of gait and mobility: Secondary | ICD-10-CM | POA: Diagnosis not present

## 2021-04-26 DIAGNOSIS — R2681 Unsteadiness on feet: Secondary | ICD-10-CM

## 2021-04-26 DIAGNOSIS — M6281 Muscle weakness (generalized): Secondary | ICD-10-CM

## 2021-04-26 NOTE — Therapy (Signed)
[facility] [address] [place], [place], [address] Phone: [phone]   Fax:  [phone]  Physical Therapy Treatment  Patient Details  Name: Sandra [name] MRN: [id number] Date of Birth: 1961-06-08 Referring Provider (PT): Newton Pigg, DPM   Encounter Date: 04/26/2021   PT End of Session - 04/26/21 1101     Visit Number 4    Number of Visits 9    Date for PT Re-Evaluation 05/29/21    Authorization Type [employer] Medicare (10th visit progress note)    Progress Note Due on Visit 10    PT Start Time 1018    PT Stop Time 1100    PT Time Calculation (min) 42 min    Activity Tolerance Patient tolerated treatment well    Behavior During Therapy [facility] for tasks assessed/performed             Past Medical History:  Diagnosis Date   Acute right MCA stroke ([place]) 2006   Acute R MCA stroked  with L arm and leg weakness   Anxiety 1980s   On Klonipin since age 60. Had addiction problem with Xanax which  Was therefore d/c . Seen in the past at [facility].    Arthritis 04/16/2011   Blind right eye    CAD (coronary artery disease) 2007   istory of MI with stent in 2007 by Dr. Chancy Milroy, [facility]. Stent placement by Dr Cleatis Polka    Chronic pain    Depression 03/07/2011   Fracture of fifth toe, left, closed 01/28/2011   History of left fifth toe proximal phalanx fracture when patient had a stroke (01/2011) and fell. Seen by Dr Doran Durand.     Fractured toe 01/2011   History of left fifth toe proximal phalanx fracture when patient had a stroke (01/2011) and fell. Seen by Dr Doran Durand.    Hyperlipidemia 03/07/2011   Hypertension 03/07/2011   Incontinence 04/14/2011   Myocardial infarct, old 2008   stents   PFO (patent foramen ovale) August 2012   PFO seen on TEE during hospitalization in 01/2011.  Patient to f/u with Dr. Leonie Man, neurology, for enrollment in trial for medical treatment of PFO   Pneumonia    Prediabetes 03/07/2011    Psoriasis 03/07/2011   Substance abuse ([place])    h/o narcotic abuse pt denies as of 01/09/12   Tobacco abuse 03/07/2011   Quit 01/2011     Past Surgical History:  Procedure Laterality Date   BIOPSY THYROID     CARDIAC CATHETERIZATION     cardiac stent     2008   CHOLECYSTECTOMY N/A 07/28/2017   Procedure: LAPAROSCOPIC CHOLECYSTECTOMY WITH INTRAOPERATIVE CHOLANGIOGRAM;  Surgeon: Jovita Kussmaul, MD;  Location: [place];  Service: General;  Laterality: N/A;   EYE SURGERY     IR GENERIC HISTORICAL  03/16/2016   IR RADIOLOGIST EVAL & MGMT 03/16/2016 Marybelle Killings, MD [facility] INTERV RAD    There were no vitals filed for this visit.   Subjective Assessment - 04/26/21 1022     Subjective Brother in law bought walker for pt. Pt reports not wanting to be dependent on RW.  Pt reports not using walker at home and forgot to bring walker to therapy today.  Pt got AFO on herself today for therapy. Reports that most days she is wearing AFO part of the day to build up tolerance.    Pertinent History CVA, MI, HTN, HLD, 3rd nerve palsy and blindness in R eye, depression  Limitations Walking;House hold activities;Standing    How long can you stand comfortably? 10 mins at most    How long can you walk comfortably? not long, "I don't walk a lot"    Patient Stated Goals "I want to be able to walk better and not fall"    Currently in Pain? Yes    Pain Score 3     Pain Location Foot    Pain Orientation Left    Pain Descriptors / Indicators Aching    Pain Type Acute pain;Other (Comment)   Medial and lateral aspects of forefoot where AFO comes in contact.   Pain Onset More than a month ago    Pain Frequency Intermittent    Aggravating Factors  walking                               [facility] Adult PT Treatment/Exercise - 04/26/21 0001       Transfers   Transfers Sit to Stand;Stand to Sit    Sit to Stand 5: Supervision;With upper extremity assist    Sit to Stand Details (indicate cue type  and reason) cues for not use walker to pull up to stand due to it affecting good body mechanics with sit<> stands.    Stand to Sit 5: Supervision      Ambulation/Gait   Ambulation/Gait Yes    Ambulation/Gait Assistance 5: Supervision    Ambulation/Gait Assistance Details working on keeping UE support light on walker and working appropriate steplength.    Ambulation Distance (Feet) 115 Feet    Assistive device Rolling walker;Other (Comment)   AFO   Gait Pattern Step-through pattern;Decreased step length - right;Decreased step length - left;Decreased stance time - left;Decreased weight shift to left;Decreased hip/knee flexion - left    Ambulation Surface Level;Indoor                     PT Education - 04/26/21 1049     Education Details Educated pt on importance of getting a orthotist appointment due to pain on lateral and medial aspects of foot when brace is donned.  Called [facility] and attempted to get pt an appointment.  Pt planning to pursue getting an appointment for this week with appropriate transportation.  Discussed wear time of AFO and skin check; skin is intact and no long term redness noted.   Explained fall risk during gait and PT recommendation for using RW at this time.    Person(s) Educated Patient    Methods Explanation    Comprehension Verbalized understanding              PT Short Term Goals - 04/20/21 1944       PT SHORT TERM GOAL #1   Title Pt will initiate HEP in order to indicate improved functional mobility and dec fall risk.  (Target Date: 04/29/21)    Time 4    Period Weeks    Status New    Target Date 04/29/21      PT SHORT TERM GOAL #2   Title Pt will perform BERG balance test and improve score 4 points from baseline in order to indicate dec fall risk.    Baseline 04/20/21 37/56    Time 4    Period Weeks    Status New      PT SHORT TERM GOAL #3   Title Pt will ambulate x 150' with AFO (with device if needed) at [facility]  I level in order to  indicate safe household ambulation.    Time 4    Period Weeks    Status New      PT SHORT TERM GOAL #4   Title Pt will improve gait speed to >/=1.8 ft/sec with AFO and device (if needed) in order to indicate dec fall risk.    Time 4    Period Weeks    Status New      PT SHORT TERM GOAL #5   Title Pt will improve TUG to </=21 secs with AFO (device if needed) at S level in order to indicate dec fall risk.    Time 4    Period Weeks    Status New               PT Long Term Goals - 04/20/21 1945       PT LONG TERM GOAL #1   Title Pt will be IND with final HEP in order to indicate dec fall risk and improved functional mobility.  (Target Date:05/29/21)    Time 8    Period Weeks    Status New      PT LONG TERM GOAL #2   Title Pt will improve BERG balance score by 8 points from baseline in order to dec fall risk.    Baseline 04/20/21 37/56    Time 8    Period Weeks    Status New      PT LONG TERM GOAL #3   Title Pt will ambulate x 500' with AFO/device (if needed) at S level over level indoor and unlevel paved outdoor surfaces in order to indicate return to community activity with family.    Time 8    Period Weeks    Status New      PT LONG TERM GOAL #4   Title Pt will improve gait speed to >/=2.2 ft/sec w/ AFO and device (if needed)  in order to indicate dec fall risk.    Time 8    Period Weeks    Status New      PT LONG TERM GOAL #5   Title Pt will improve TUG to </=16 secs with AFO/device as needed at mod I level in order to indicate dec fall risk.    Time 8    Period Weeks    Status New                   Plan - 04/26/21 1118     Clinical Impression Statement Pt's gait is limited due to pain in medial and lateral aspect of L foot when brace is donned.  Pt plans to follow up with orthotist for AFO adjustments.  Reiterated pt's fall risk and reinforced the recommendation for using RW with gait; pt verbalized understanding.    Personal Factors and  Comorbidities Comorbidity 3+;Time since onset of injury/illness/exacerbation    Comorbidities see above    Examination-Activity Limitations Locomotion Level;Squat;Lift;Stairs;Stand;Transfers    Examination-Participation Restrictions Community Activity;Driving;Cleaning    Stability/Clinical Decision Making Evolving/Moderate complexity    Rehab Potential Fair    PT Frequency 2x / week   recommended 2x however she will only be seen once a week   PT Duration 8 weeks    PT Treatment/Interventions ADLs/Self Care Home Management;Aquatic Therapy;Electrical Stimulation;DME Instruction;Gait training;Stair training;Functional mobility training;Therapeutic activities;Therapeutic exercise;Balance training;Neuromuscular re-education;Patient/family education;Orthotic Fit/Training;Passive range of motion;Vestibular    PT Next Visit Plan Did orthotist make adjustments to AFO?  how is HEP going-add more standing  as able since she has AFO, work on gait with AFO and walker.    Consulted and Agree with Plan of Care Patient             Patient will benefit from skilled therapeutic intervention in order to improve the following deficits and impairments:  Abnormal gait, Decreased activity tolerance, Decreased balance, Decreased endurance, Decreased mobility, Decreased range of motion, Decreased strength, Impaired perceived functional ability, Impaired flexibility, Impaired UE functional use, Postural dysfunction, Pain (pain will likely be addressed through bracing)  Visit Diagnosis: Other abnormalities of gait and mobility  Muscle weakness (generalized)  Unsteadiness on feet     Problem List Patient Active Problem List   Diagnosis Date Noted   Mature cataract 04/06/2020   Visual field defect 04/06/2020   Hyperopia of left eye with astigmatism and presbyopia 04/06/2020   Phthisis bulbi of right eye 04/06/2020   Colon polyps 08/21/2017   Symptomatic cholelithiasis 04/26/2017   Undiagnosed cardiac  murmurs 06/24/2016   Morbid obesity ([place]) 06/05/2015   GAD (generalized anxiety disorder) 04/08/2015   Spastic hemiplegia affecting nondominant side ([place]) 04/02/2015   Benzodiazepine withdrawal with complication ([place]) 24/23/5361   Hypokalemia 12/23/2013   Cold intolerance 11/25/2013   Recurrent pneumonia 11/06/2013   MRSA bacteremia 11/06/2013   Bilateral endophthalmitis 44/31/5400   Metabolic syndrome 86/76/1950   Routine adult health maintenance 08/13/2013   Dyspareunia, female 02/06/2013   Lumbar and sacral osteoarthritis 12/06/2012   Diarrhea 11/15/2012   Insomnia 07/23/2012   HIV exposure 06/27/2012   Gout 01/31/2012   Benzodiazepine dependence, continuous ([place]) 01/26/2012    Class: Chronic   Hot flashes 01/09/2012   Tobacco abuse 01/09/2012   Tobacco dependence syndrome 01/09/2012   Arthritis 04/16/2011   Anxiety 04/14/2011   Incontinence 04/14/2011   PFO (patent foramen ovale) 03/07/2011   CAD (coronary artery disease) 03/07/2011   Essential hypertension 03/07/2011   Hyperlipidemia 03/07/2011   Depression 03/07/2011   Psoriasis 03/07/2011   H/O: CVA (cerebrovascular accident) 01/27/2011   Bjorn Loser, PTA  04/26/21, 11:29 AM   [facility] [address] [place], [place], [address] Phone: [phone]   Fax:  [phone]  Name: Agripina Guyette MRN: [id number] Date of Birth: 17-Mar-1961

## 2021-05-04 ENCOUNTER — Ambulatory Visit: Payer: Medicare (Managed Care) | Admitting: Rehabilitation

## 2021-05-04 ENCOUNTER — Other Ambulatory Visit: Payer: Self-pay

## 2021-05-04 ENCOUNTER — Encounter: Payer: Self-pay | Admitting: Rehabilitation

## 2021-05-04 DIAGNOSIS — M6281 Muscle weakness (generalized): Secondary | ICD-10-CM

## 2021-05-04 DIAGNOSIS — I69354 Hemiplegia and hemiparesis following cerebral infarction affecting left non-dominant side: Secondary | ICD-10-CM

## 2021-05-04 DIAGNOSIS — R2689 Other abnormalities of gait and mobility: Secondary | ICD-10-CM

## 2021-05-04 DIAGNOSIS — R2681 Unsteadiness on feet: Secondary | ICD-10-CM

## 2021-05-04 NOTE — Therapy (Signed)
[facility] [address] [place], [place], [address] Phone: [phone]   Fax:  [phone]  Physical Therapy Treatment  Patient Details  Name: Sandra [name] MRN: [id number] Date of Birth: 25-Apr-1961 Referring Provider (PT): Newton Pigg, DPM   Encounter Date: 05/04/2021   PT End of Session - 05/04/21 1655     Visit Number 5    Number of Visits 9    Date for PT Re-Evaluation 05/29/21    Authorization Type [employer] Medicare (10th visit progress note)    Progress Note Due on Visit 10    PT Start Time 1427    PT Stop Time 1510    PT Time Calculation (min) 43 min    Activity Tolerance Patient tolerated treatment well    Behavior During Therapy [facility] for tasks assessed/performed             Past Medical History:  Diagnosis Date   Acute right MCA stroke ([place]) 2006   Acute R MCA stroked  with L arm and leg weakness   Anxiety 1980s   On Klonipin since age 53. Had addiction problem with Xanax which  Was therefore d/c . Seen in the past at [facility].    Arthritis 04/16/2011   Blind right eye    CAD (coronary artery disease) 2007   istory of MI with stent in 2007 by Dr. Chancy Milroy, [facility]. Stent placement by Dr Cleatis Polka    Chronic pain    Depression 03/07/2011   Fracture of fifth toe, left, closed 01/28/2011   History of left fifth toe proximal phalanx fracture when patient had a stroke (01/2011) and fell. Seen by Dr Doran Durand.     Fractured toe 01/2011   History of left fifth toe proximal phalanx fracture when patient had a stroke (01/2011) and fell. Seen by Dr Doran Durand.    Hyperlipidemia 03/07/2011   Hypertension 03/07/2011   Incontinence 04/14/2011   Myocardial infarct, old 2008   stents   PFO (patent foramen ovale) August 2012   PFO seen on TEE during hospitalization in 01/2011.  Patient to f/u with Dr. Leonie Man, neurology, for enrollment in trial for medical treatment of PFO   Pneumonia    Prediabetes 03/07/2011    Psoriasis 03/07/2011   Substance abuse ([place])    h/o narcotic abuse pt denies as of 01/09/12   Tobacco abuse 03/07/2011   Quit 01/2011     Past Surgical History:  Procedure Laterality Date   BIOPSY THYROID     CARDIAC CATHETERIZATION     cardiac stent     2008   CHOLECYSTECTOMY N/A 07/28/2017   Procedure: LAPAROSCOPIC CHOLECYSTECTOMY WITH INTRAOPERATIVE CHOLANGIOGRAM;  Surgeon: Jovita Kussmaul, MD;  Location: [facility];  Service: General;  Laterality: N/A;   EYE SURGERY     IR GENERIC HISTORICAL  03/16/2016   IR RADIOLOGIST EVAL & MGMT 03/16/2016 Marybelle Killings, MD [facility] INTERV RAD    There were no vitals filed for this visit.   Subjective Assessment - 05/04/21 1644     Subjective Pt has gotten strap attached to AFO, also has RW with her today.  She reports she had a hard time getting brace on correctly.  PT did note that ankle strap was not tight enough and was not fully in the correct placement.    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 No/denies                               [facility] Adult PT Treatment/Exercise - 05/04/21 1427       Transfers   Transfers Sit to Stand;Stand to Sit    Sit to Stand 5: Supervision;With upper extremity assist    Stand to Sit 5: Supervision      Ambulation/Gait   Ambulation/Gait Yes    Ambulation/Gait Assistance 5: Supervision;4: Min assist    Ambulation/Gait Assistance Details Ambulated into and out of clinic with RW/AFO at S level.  PT challenged her without device during session with 7lb weight donned to L LE to improve swing during gait.  PT provided light HHA (minA) with light facilitation for improved gait speed.  Performed in this manner x 230'    Ambulation Distance (Feet) 230 Feet    Assistive device 1 person hand held assist   AFO   Gait Pattern Step-through pattern;Decreased step length  - right;Decreased step length - left;Decreased stance time - left;Decreased weight shift to left;Decreased hip/knee flexion - left    Ambulation Surface Level;Indoor    Gait Comments Step ups at stairs (to bottom step) with BUE support x 20 reps alt which foot leads to improve stance time during gait, esp on LLE and also for improved trunk shortening on LE being lifted.      Neuro Re-ed    Neuro Re-ed Details  Worked on stepping strategy/high intensity with 7lb weight donned on LLE tapping to targets being called out by PT, in clock pattern.  Also had pt work on crossing midline several reps during task.  Pt requires min A throughout but did well.  Stepping over orange barriers (4, smaller ones) with 7lb weight donned on LLE to encourage higher step x 4 laps with min A.  Cues for increased time in L SLS for improved RLE clearance.                     PT Education - 05/04/21 1654     Education Details Discussed that sister/friend would likely have to help her getting fully seated in AFO in order to get max benefits of brace and no pain in foot from brace.  Feel that pt can get brace on and in shoe and then have sister just tighten down for her once in standing/weight bearing.    Person(s) Educated Patient    Methods Explanation;Demonstration    Comprehension Verbalized understanding              PT Short Term Goals - 04/20/21 1944       PT SHORT TERM GOAL #1   Title Pt will initiate HEP in order to indicate improved functional mobility and dec fall risk.  (Target Date: 04/29/21)    Time 4    Period Weeks    Status New    Target Date 04/29/21      PT SHORT TERM GOAL #2   Title Pt will perform BERG balance test and improve score 4 points from baseline in order to indicate dec fall risk.    Baseline 04/20/21 37/56    Time 4    Period Weeks    Status New      PT SHORT TERM GOAL #3   Title Pt will ambulate x 150' with AFO (with device if needed) at mod I  level in order to  indicate safe household ambulation.    Time 4    Period Weeks    Status New      PT SHORT TERM GOAL #4   Title Pt will improve gait speed to >/=1.8 ft/sec with AFO and device (if needed) in order to indicate dec fall risk.    Time 4    Period Weeks    Status New      PT SHORT TERM GOAL #5   Title Pt will improve TUG to </=21 secs with AFO (device if needed) at S level in order to indicate dec fall risk.    Time 4    Period Weeks    Status New               PT Long Term Goals - 04/20/21 1945       PT LONG TERM GOAL #1   Title Pt will be IND with final HEP in order to indicate dec fall risk and improved functional mobility.  (Target Date:05/29/21)    Time 8    Period Weeks    Status New      PT LONG TERM GOAL #2   Title Pt will improve BERG balance score by 8 points from baseline in order to dec fall risk.    Baseline 04/20/21 37/56    Time 8    Period Weeks    Status New      PT LONG TERM GOAL #3   Title Pt will ambulate x 500' with AFO/device (if needed) at S level over level indoor and unlevel paved outdoor surfaces in order to indicate return to community activity with family.    Time 8    Period Weeks    Status New      PT LONG TERM GOAL #4   Title Pt will improve gait speed to >/=2.2 ft/sec w/ AFO and device (if needed)  in order to indicate dec fall risk.    Time 8    Period Weeks    Status New      PT LONG TERM GOAL #5   Title Pt will improve TUG to </=16 secs with AFO/device as needed at mod I level in order to indicate dec fall risk.    Time 8    Period Weeks    Status New                   Plan - 05/04/21 1656     Clinical Impression Statement Session focused on improved donning of AFO to get max benefit.  Then worked on high level balance and stepping with weight on LLE to carryover to imrpoved swing and stance phase of gait.  Pt tolerated well but does get anxious with higher level tasks.    Personal Factors and Comorbidities  Comorbidity 3+;Time since onset of injury/illness/exacerbation    Comorbidities see above    Examination-Activity Limitations Locomotion Level;Squat;Lift;Stairs;Stand;Transfers    Examination-Participation Restrictions Community Activity;Driving;Cleaning    Stability/Clinical Decision Making Evolving/Moderate complexity    Rehab Potential Fair    PT Frequency 2x / week   recommended 2x however she will only be seen once a week   PT Duration 8 weeks    PT Treatment/Interventions ADLs/Self Care Home Management;Aquatic Therapy;Electrical Stimulation;DME Instruction;Gait training;Stair training;Functional mobility training;Therapeutic activities;Therapeutic exercise;Balance training;Neuromuscular re-education;Patient/family education;Orthotic Fit/Training;Passive range of motion;Vestibular    PT Next Visit Plan check STGs, how is HEP going-add more standing as able since she has AFO, work  on gait with AFO and walker. High level balance    Consulted and Agree with Plan of Care Patient             Patient will benefit from skilled therapeutic intervention in order to improve the following deficits and impairments:  Abnormal gait, Decreased activity tolerance, Decreased balance, Decreased endurance, Decreased mobility, Decreased range of motion, Decreased strength, Impaired perceived functional ability, Impaired flexibility, Impaired UE functional use, Postural dysfunction, Pain (pain will likely be addressed through bracing)  Visit Diagnosis: Other abnormalities of gait and mobility  Muscle weakness (generalized)  Unsteadiness on feet  Hemiplegia and hemiparesis following cerebral infarction affecting left non-dominant side [facility])     Problem List Patient Active Problem List   Diagnosis Date Noted   Mature cataract 04/06/2020   Visual field defect 04/06/2020   Hyperopia of left eye with astigmatism and presbyopia 04/06/2020   Phthisis bulbi of right eye 04/06/2020   Colon polyps  08/21/2017   Symptomatic cholelithiasis 04/26/2017   Undiagnosed cardiac murmurs 06/24/2016   Morbid obesity ([place]) 06/05/2015   GAD (generalized anxiety disorder) 04/08/2015   Spastic hemiplegia affecting nondominant side ([place]) 04/02/2015   Benzodiazepine withdrawal with complication ([place]) 16/55/3748   Hypokalemia 12/23/2013   Cold intolerance 11/25/2013   Recurrent pneumonia 11/06/2013   MRSA bacteremia 11/06/2013   Bilateral endophthalmitis 27/12/8673   Metabolic syndrome 44/92/0100   Routine adult health maintenance 08/13/2013   Dyspareunia, female 02/06/2013   Lumbar and sacral osteoarthritis 12/06/2012   Diarrhea 11/15/2012   Insomnia 07/23/2012   HIV exposure 06/27/2012   Gout 01/31/2012   Benzodiazepine dependence, continuous ([place]) 01/26/2012    Class: Chronic   Hot flashes 01/09/2012   Tobacco abuse 01/09/2012   Tobacco dependence syndrome 01/09/2012   Arthritis 04/16/2011   Anxiety 04/14/2011   Incontinence 04/14/2011   PFO (patent foramen ovale) 03/07/2011   CAD (coronary artery disease) 03/07/2011   Essential hypertension 03/07/2011   Hyperlipidemia 03/07/2011   Depression 03/07/2011   Psoriasis 03/07/2011   H/O: CVA (cerebrovascular accident) 01/27/2011    Cameron Sprang, PT, MPT [facility] [address] [place], [place], [address] Phone: [phone]   Fax:  [phone] 05/04/21, 4:59 PM   Name: Sandra [name] MRN: [id number] Date of Birth: 1960-08-13

## 2021-05-10 ENCOUNTER — Ambulatory Visit: Payer: Medicare (Managed Care) | Admitting: Physical Therapy

## 2021-05-10 ENCOUNTER — Encounter: Payer: Self-pay | Admitting: Physical Therapy

## 2021-05-10 ENCOUNTER — Other Ambulatory Visit: Payer: Self-pay

## 2021-05-10 DIAGNOSIS — R2681 Unsteadiness on feet: Secondary | ICD-10-CM

## 2021-05-10 DIAGNOSIS — R2689 Other abnormalities of gait and mobility: Secondary | ICD-10-CM

## 2021-05-10 DIAGNOSIS — M6281 Muscle weakness (generalized): Secondary | ICD-10-CM

## 2021-05-10 NOTE — Therapy (Signed)
[facility] [place] [address] [place], [place], [address] Phone: [phone]   Fax:  [phone]  Physical Therapy Treatment  Patient Details  Name: Sandra [name] MRN: [id number] Date of Birth: November 29, 1960 Referring Provider (PT): Newton Pigg, DPM   Encounter Date: 05/10/2021   PT End of Session - 05/10/21 1058     Visit Number 6    Number of Visits 9    Date for PT Re-Evaluation 05/29/21    Authorization Type [employer] Medicare (10th visit progress note)    Progress Note Due on Visit 10    PT Start Time 1019    Activity Tolerance Patient tolerated treatment well    Behavior During Therapy [facility] for tasks assessed/performed             Past Medical History:  Diagnosis Date   Acute right MCA stroke ([place]) 2006   Acute R MCA stroked  with L arm and leg weakness   Anxiety 1980s   On Klonipin since age 89. Had addiction problem with Xanax which  Was therefore d/c . Seen in the past at [facility].    Arthritis 04/16/2011   Blind right eye    CAD (coronary artery disease) 2007   istory of MI with stent in 2007 by Dr. Chancy Milroy, [facility]. Stent placement by Dr Cleatis Polka    Chronic pain    Depression 03/07/2011   Fracture of fifth toe, left, closed 01/28/2011   History of left fifth toe proximal phalanx fracture when patient had a stroke (01/2011) and fell. Seen by Dr Doran Durand.     Fractured toe 01/2011   History of left fifth toe proximal phalanx fracture when patient had a stroke (01/2011) and fell. Seen by Dr Doran Durand.    Hyperlipidemia 03/07/2011   Hypertension 03/07/2011   Incontinence 04/14/2011   Myocardial infarct, old 2008   stents   PFO (patent foramen ovale) August 2012   PFO seen on TEE during hospitalization in 01/2011.  Patient to f/u with Dr. Leonie Man, neurology, for enrollment in trial for medical treatment of PFO   Pneumonia    Prediabetes 03/07/2011   Psoriasis 03/07/2011   Substance abuse ([place])    h/o  narcotic abuse pt denies as of 01/09/12   Tobacco abuse 03/07/2011   Quit 01/2011     Past Surgical History:  Procedure Laterality Date   BIOPSY THYROID     CARDIAC CATHETERIZATION     cardiac stent     2008   CHOLECYSTECTOMY N/A 07/28/2017   Procedure: LAPAROSCOPIC CHOLECYSTECTOMY WITH INTRAOPERATIVE CHOLANGIOGRAM;  Surgeon: Jovita Kussmaul, MD;  Location: [place];  Service: General;  Laterality: N/A;   EYE SURGERY     IR GENERIC HISTORICAL  03/16/2016   IR RADIOLOGIST EVAL & MGMT 03/16/2016 Marybelle Killings, MD [facility] INTERV RAD    There were no vitals filed for this visit.   Subjective Assessment - 05/10/21 1021     Subjective Pt reports putting on brace this morning by herself.  Pt reports not having anyone to help her with putting on the brace.  Pt reports wearing brace most of the time at home, "helps foot not turn so much." Pt uses the walker some at home but doesn't want to be dependent on it.    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 Yes    Pain Score 5     Pain Location Foot    Pain Orientation Left    Pain Descriptors / Indicators Dull    Pain Type Other (Comment)   pain/discomfort in brace.   Pain Onset More than a month ago    Pain Frequency Intermittent                [facility] PT Assessment - 05/10/21 0001       Ambulation/Gait   Gait velocity 15.03 sec = 2.62f/sec with RW and L AFO donned.    Pre-Gait Activities --      Standardized Balance Assessment   Standardized Balance Assessment Timed Up and Go Test;Berg Balance Test      Berg Balance Test   Sit to Stand Able to stand  independently using hands    Standing Unsupported Able to stand safely 2 minutes    Sitting with Back Unsupported but Feet Supported on Floor or Stool Able to sit safely and securely 2 minutes    Stand to Sit Sits safely with minimal use of hands     Transfers Able to transfer safely, minor use of hands    Standing Unsupported with Eyes Closed Able to stand 10 seconds safely    Standing Unsupported with Feet Together Able to place feet together independently and stand for 1 minute with supervision    From Standing, Reach Forward with Outstretched Arm Can reach forward >5 cm safely (2")    From Standing Position, Pick up Object from [place] pick up shoe safely and easily    From Standing Position, Turn to Look Behind Over each Shoulder Looks behind from both sides and weight shifts well    Turn 360 Degrees Needs close supervision or verbal cueing    Standing Unsupported, Alternately Place Feet on Step/Stool Able to complete >2 steps/needs minimal assist    Standing Unsupported, One Foot in Front Able to take small step independently and hold 30 seconds    Standing on One Leg Tries to lift leg/unable to hold 3 seconds but remains standing independently    Total Score 41    Berg comment: 37-45 significant (>80%) 52-55 lower (> 25%)      Timed Up and Go Test   TUG Normal TUG  With RW  and AFO 21.37 sec; without RW or HHA and L AFO donned 18.97sec.                                   PT Education - 05/10/21 1037     Education Details Skin check on Left foot with brace- no skin issues. Discussed pt's need to follow up with orthotist when there is pain or discomfort wearing AFO. Discussed goals checked.              PT Short Term Goals - 05/10/21 1042       PT SHORT TERM GOAL #1   Title Pt will initiate HEP in order to indicate improved functional mobility and dec fall risk.  (Target Date: 04/29/21)    Baseline Met, 04/2121    Time 4    Period Weeks    Status Achieved    Target Date 04/29/21      PT SHORT TERM GOAL #2   Title Pt will perform BERG balance test and improve score 4 points from baseline in order to indicate dec  fall risk.    Baseline 05/10/21 41/56    Time 4    Period Weeks     Status Achieved      PT SHORT TERM GOAL #3   Title Pt will ambulate x 150' with AFO (with device if needed) at mod I level in order to indicate safe household ambulation.    Baseline Met with RW and L AFO donned. 05/10/21    Time 4    Period Weeks    Status Achieved      PT SHORT TERM GOAL #4   Title Pt will improve gait speed to >/=1.8 ft/sec with AFO and device (if needed) in order to indicate dec fall risk.    Baseline Met, 2.18 ft/sec with RW and L AFO donned. 05/10/21    Time 4    Period Weeks    Status Achieved      PT SHORT TERM GOAL #5   Title Pt will improve TUG to </=21 secs with AFO (device if needed) at S level in order to indicate dec fall risk.    Baseline Pt with RW and L AFO 21.31sec; pt with AFO only (no HHA) 18.97 sec.  05/10/21    Time 4    Period Weeks    Status Achieved               PT Long Term Goals - 04/20/21 1945       PT LONG TERM GOAL #1   Title Pt will be IND with final HEP in order to indicate dec fall risk and improved functional mobility.  (Target Date:05/29/21)    Time 8    Period Weeks    Status New      PT LONG TERM GOAL #2   Title Pt will improve BERG balance score by 8 points from baseline in order to dec fall risk.    Baseline 04/20/21 37/56    Time 8    Period Weeks    Status New      PT LONG TERM GOAL #3   Title Pt will ambulate x 500' with AFO/device (if needed) at S level over level indoor and unlevel paved outdoor surfaces in order to indicate return to community activity with family.    Time 8    Period Weeks    Status New      PT LONG TERM GOAL #4   Title Pt will improve gait speed to >/=2.2 ft/sec w/ AFO and device (if needed)  in order to indicate dec fall risk.    Time 8    Period Weeks    Status New      PT LONG TERM GOAL #5   Title Pt will improve TUG to </=16 secs with AFO/device as needed at mod I level in order to indicate dec fall risk.    Time 8    Period Weeks    Status New                    Plan - 05/10/21 1059     Clinical Impression Statement Pt is making good progress towards all functional mobility goals meeting all STGs.  Pt continues to report that L AFO causes pain/discomfort in foot with longer walks.    Personal Factors and Comorbidities Comorbidity 3+;Time since onset of injury/illness/exacerbation    Comorbidities see above    Examination-Activity Limitations Locomotion Level;Squat;Lift;Stairs;Stand;Transfers    Examination-Participation Restrictions Community Activity;Driving;Cleaning    Stability/Clinical Decision Making Evolving/Moderate complexity  Rehab Potential Fair    PT Frequency 2x / week   recommended 2x however she will only be seen once a week   PT Duration 8 weeks    PT Treatment/Interventions ADLs/Self Care Home Management;Aquatic Therapy;Electrical Stimulation;DME Instruction;Gait training;Stair training;Functional mobility training;Therapeutic activities;Therapeutic exercise;Balance training;Neuromuscular re-education;Patient/family education;Orthotic Fit/Training;Passive range of motion;Vestibular    PT Next Visit Plan check STGs, how is HEP going-add more standing as able since she has AFO, work on gait with AFO and walker. High level balance    Consulted and Agree with Plan of Care Patient             Patient will benefit from skilled therapeutic intervention in order to improve the following deficits and impairments:  Abnormal gait, Decreased activity tolerance, Decreased balance, Decreased endurance, Decreased mobility, Decreased range of motion, Decreased strength, Impaired perceived functional ability, Impaired flexibility, Impaired UE functional use, Postural dysfunction, Pain (pain will likely be addressed through bracing)  Visit Diagnosis: Other abnormalities of gait and mobility  Muscle weakness (generalized)  Unsteadiness on feet     Problem List Patient Active Problem List   Diagnosis Date Noted   Mature  cataract 04/06/2020   Visual field defect 04/06/2020   Hyperopia of left eye with astigmatism and presbyopia 04/06/2020   Phthisis bulbi of right eye 04/06/2020   Colon polyps 08/21/2017   Symptomatic cholelithiasis 04/26/2017   Undiagnosed cardiac murmurs 06/24/2016   Morbid obesity ([place]) 06/05/2015   GAD (generalized anxiety disorder) 04/08/2015   Spastic hemiplegia affecting nondominant side ([place]) 04/02/2015   Benzodiazepine withdrawal with complication ([place]) 00/71/2197   Hypokalemia 12/23/2013   Cold intolerance 11/25/2013   Recurrent pneumonia 11/06/2013   MRSA bacteremia 11/06/2013   Bilateral endophthalmitis 58/83/2549   Metabolic syndrome 82/64/1583   Routine adult health maintenance 08/13/2013   Dyspareunia, female 02/06/2013   Lumbar and sacral osteoarthritis 12/06/2012   Diarrhea 11/15/2012   Insomnia 07/23/2012   HIV exposure 06/27/2012   Gout 01/31/2012   Benzodiazepine dependence, continuous ([place]) 01/26/2012    Class: Chronic   Hot flashes 01/09/2012   Tobacco abuse 01/09/2012   Tobacco dependence syndrome 01/09/2012   Arthritis 04/16/2011   Anxiety 04/14/2011   Incontinence 04/14/2011   PFO (patent foramen ovale) 03/07/2011   CAD (coronary artery disease) 03/07/2011   Essential hypertension 03/07/2011   Hyperlipidemia 03/07/2011   Depression 03/07/2011   Psoriasis 03/07/2011   H/O: CVA (cerebrovascular accident) 01/27/2011   Bjorn Loser, PTA  05/10/21, 12:28 PM   [place] [address] [place], [place], [address] Phone: [phone]   Fax:  [phone]  Name: Darl Brisbin MRN: [id number] Date of Birth: [birth year]

## 2021-05-17 ENCOUNTER — Other Ambulatory Visit: Payer: Self-pay

## 2021-05-17 ENCOUNTER — Ambulatory Visit: Payer: Medicare (Managed Care) | Admitting: Physical Therapy

## 2021-05-17 DIAGNOSIS — R2689 Other abnormalities of gait and mobility: Secondary | ICD-10-CM | POA: Diagnosis not present

## 2021-05-17 DIAGNOSIS — R296 Repeated falls: Secondary | ICD-10-CM

## 2021-05-17 DIAGNOSIS — R2681 Unsteadiness on feet: Secondary | ICD-10-CM

## 2021-05-17 DIAGNOSIS — M6281 Muscle weakness (generalized): Secondary | ICD-10-CM

## 2021-05-17 NOTE — Therapy (Signed)
[facility] [address] [place], [place], [address] Phone: [phone]   Fax:  [phone]  Physical Therapy Treatment  Patient Details  Name: Sandra [name] MRN: [id number] Date of Birth: 07/06/1960 Referring Provider (PT): John Bonvillian, DPM   Encounter Date: 05/17/2021   PT End of Session - 05/17/21 1201     Visit Number 7    Number of Visits 9    Date for PT Re-Evaluation 05/29/21    Authorization Type [employer] Medicare (10th visit progress note)    Progress Note Due on Visit 10    PT Start Time 1110    PT Stop Time 1155    PT Time Calculation (min) 45 min    Equipment Utilized During Treatment Gait belt    Activity Tolerance Patient tolerated treatment well    Behavior During Therapy WFL for tasks assessed/performed             Past Medical History:  Diagnosis Date   Acute right MCA stroke ([facility]) 2006   Acute R MCA stroked  with L arm and leg weakness   Anxiety 1980s   On Klonipin since age 21. Had addiction problem with Xanax which  Was therefore d/c . [facility]    Arthritis 04/16/2011   Blind right eye    CAD (coronary artery disease) 2007   istory of MI with stent in 2007 by Dr. Kahn, [facility]. Stent placement by Dr Paraschwo    Chronic pain    Depression 03/07/2011   Fracture of fifth toe, left, closed 01/28/2011   History of left fifth toe proximal phalanx fracture when patient had a stroke (01/2011) and fell. Seen by Dr Hewitt.     Fractured toe 01/2011   History of left fifth toe proximal phalanx fracture when patient had a stroke (01/2011) and fell. Seen by Dr Hewitt.    Hyperlipidemia 03/07/2011   Hypertension 03/07/2011   Incontinence 04/14/2011   Myocardial infarct, old 2008   stents   PFO (patent foramen ovale) August 2012   PFO seen on TEE during hospitalization in 01/2011.  Patient to f/u with Dr. Sethi, neurology, for enrollment in trial for medical  treatment of PFO   Pneumonia    Prediabetes 03/07/2011   Psoriasis 03/07/2011   Substance abuse ([facility])    h/o narcotic abuse pt denies as of 01/09/12   Tobacco abuse 03/07/2011   Quit 01/2011     Past Surgical History:  Procedure Laterality Date   BIOPSY THYROID     CARDIAC CATHETERIZATION     cardiac stent     2008   CHOLECYSTECTOMY N/A 07/28/2017   Procedure: LAPAROSCOPIC CHOLECYSTECTOMY WITH INTRAOPERATIVE CHOLANGIOGRAM;  Surgeon: Toth, Paul III, MD;  Location[facility];  Service: General;  Laterality: N/A;   EYE SURGERY     IR GENERIC HISTORICAL  03/16/2016   IR RADIOLOGIST EVAL & MGMT 03/16/2016 Arthur Hoss, MD [facility] INTERV RAD    There were no vitals filed for this visit.   Subjective Assessment - 05/17/21 1111     Subjective Pt reports still having trouble with AFO on outside of foot.    Pertinent History CVA, MI, HTN, HLD, 3rd nerve palsy and blindness in R eye, depression    Patient Stated Goals "I want to be able to walk better and not fall"    Currently in Pain? Yes    Pain Score 4     Pain Location Foot      Pain Orientation Left    Pain Descriptors / Indicators Aching    Pain Type Chronic pain    Pain Onset More than a month ago    Pain Frequency Intermittent                               [facility] Adult PT Treatment/Exercise - 05/17/21 0001       Ambulation/Gait   Ambulation/Gait Yes    Ambulation/Gait Assistance 4: Min assist   Min A with SPC to HHA towards the end of the walk due to pt having back pain.   Ambulation/Gait Assistance Details training with [facility] cue for cane placement and sequencing.    Ambulation Distance (Feet) 115 Feet    Assistive device Rolling walker;1 person hand held assist    Gait Pattern Step-through pattern;Decreased step length - right;Decreased step length - left;Decreased stance time - left;Decreased weight shift to left;Decreased hip/knee flexion - left    Ambulation Surface Level;Indoor                 Balance  Exercises - 05/17/21 0001       Balance Exercises: Standing   Standing, One Foot on a Step 6 inch   with SPC for support and Min A cues for righting reactions and timing.   Other Standing Exercises standing multi level reaching including upper trunk rotations without AD min guard to min A.                PT Education - 05/17/21 1142     Education Details Discussed need to follow up with orthotist when there is pain or discomfort wearing AFO.              PT Short Term Goals - 05/10/21 1042       PT SHORT TERM GOAL #1   Title Pt will initiate HEP in order to indicate improved functional mobility and dec fall risk.  (Target Date: 04/29/21)    Baseline Met, 04/2121    Time 4    Period Weeks    Status Achieved    Target Date 04/29/21      PT SHORT TERM GOAL #2   Title Pt will perform BERG balance test and improve score 4 points from baseline in order to indicate dec fall risk.    Baseline 05/10/21 41/56    Time 4    Period Weeks    Status Achieved      PT SHORT TERM GOAL #3   Title Pt will ambulate x 150' with AFO (with device if needed) at mod I level in order to indicate safe household ambulation.    Baseline Met with RW and L AFO donned. 05/10/21    Time 4    Period Weeks    Status Achieved      PT SHORT TERM GOAL #4   Title Pt will improve gait speed to >/=1.8 ft/sec with AFO and device (if needed) in order to indicate dec fall risk.    Baseline Met, 2.18 ft/sec with RW and L AFO donned. 05/10/21    Time 4    Period Weeks    Status Achieved      PT SHORT TERM GOAL #5   Title Pt will improve TUG to </=21 secs with AFO (device if needed) at S level in order to indicate dec fall risk.    Baseline Pt with RW and L AFO 21.31sec; pt with  AFO only (no HHA) 18.97 sec.  05/10/21    Time 4    Period Weeks    Status Achieved               PT Long Term Goals - 04/20/21 1945       PT LONG TERM GOAL #1   Title Pt will be IND with final HEP in order to  indicate dec fall risk and improved functional mobility.  (Target Date:05/29/21)    Time 8    Period Weeks    Status New      PT LONG TERM GOAL #2   Title Pt will improve BERG balance score by 8 points from baseline in order to dec fall risk.    Baseline 04/20/21 37/56    Time 8    Period Weeks    Status New      PT LONG TERM GOAL #3   Title Pt will ambulate x 500' with AFO/device (if needed) at S level over level indoor and unlevel paved outdoor surfaces in order to indicate return to community activity with family.    Time 8    Period Weeks    Status New      PT LONG TERM GOAL #4   Title Pt will improve gait speed to >/=2.2 ft/sec w/ AFO and device (if needed)  in order to indicate dec fall risk.    Time 8    Period Weeks    Status New      PT LONG TERM GOAL #5   Title Pt will improve TUG to </=16 secs with AFO/device as needed at mod I level in order to indicate dec fall risk.    Time 8    Period Weeks    Status New                   Plan - 05/17/21 1202     Clinical Impression Statement Progressed balance  and gait training working with SPC for support; pt demonstrates increased fear of falling and required min guard to Min A.    Personal Factors and Comorbidities Comorbidity 3+;Time since onset of injury/illness/exacerbation    Comorbidities see above    Examination-Activity Limitations Locomotion Level;Squat;Lift;Stairs;Stand;Transfers    Examination-Participation Restrictions Community Activity;Driving;Cleaning    Stability/Clinical Decision Making Evolving/Moderate complexity    Rehab Potential Fair    PT Frequency 2x / week   recommended 2x however she will only be seen once a week   PT Duration 8 weeks    PT Treatment/Interventions ADLs/Self Care Home Management;Aquatic Therapy;Electrical Stimulation;DME Instruction;Gait training;Stair training;Functional mobility training;Therapeutic activities;Therapeutic exercise;Balance training;Neuromuscular  re-education;Patient/family education;Orthotic Fit/Training;Passive range of motion;Vestibular    PT Next Visit Plan check for follow up with orthotist due to pain in L foot with AFO donned,  assess D/C vs. more visits needed per LTGs   Consulted and Agree with Plan of Care Patient             Patient will benefit from skilled therapeutic intervention in order to improve the following deficits and impairments:  Abnormal gait, Decreased activity tolerance, Decreased balance, Decreased endurance, Decreased mobility, Decreased range of motion, Decreased strength, Impaired perceived functional ability, Impaired flexibility, Impaired UE functional use, Postural dysfunction, Pain (pain will likely be addressed through bracing)  Visit Diagnosis: Other abnormalities of gait and mobility  Muscle weakness (generalized)  Unsteadiness on feet  Repeated falls     Problem List Patient Active Problem List   Diagnosis Date Noted     Mature cataract 04/06/2020   Visual field defect 04/06/2020   Hyperopia of left eye with astigmatism and presbyopia 04/06/2020   Phthisis bulbi of right eye 04/06/2020   Colon polyps 08/21/2017   Symptomatic cholelithiasis 04/26/2017   Undiagnosed cardiac murmurs 06/24/2016   Morbid obesity ([facility]) 06/05/2015   GAD (generalized anxiety disorder) 04/08/2015   Spastic hemiplegia affecting nondominant side ([facility]) 04/02/2015   Benzodiazepine withdrawal with complication ([facility]) 01/02/2015   Hypokalemia 12/23/2013   Cold intolerance 11/25/2013   Recurrent pneumonia 11/06/2013   MRSA bacteremia 11/06/2013   Bilateral endophthalmitis 11/06/2013   Metabolic syndrome 08/13/2013   Routine adult health maintenance 08/13/2013   Dyspareunia, female 02/06/2013   Lumbar and sacral osteoarthritis 12/06/2012   Diarrhea 11/15/2012   Insomnia 07/23/2012   HIV exposure 06/27/2012   Gout 01/31/2012   Benzodiazepine dependence, continuous ([facility]) 01/26/2012    Class: Chronic   Hot  flashes 01/09/2012   Tobacco abuse 01/09/2012   Tobacco dependence syndrome 01/09/2012   Arthritis 04/16/2011   Anxiety 04/14/2011   Incontinence 04/14/2011   PFO (patent foramen ovale) 03/07/2011   CAD (coronary artery disease) 03/07/2011   Essential hypertension 03/07/2011   Hyperlipidemia 03/07/2011   Depression 03/07/2011   Psoriasis 03/07/2011   H/O: CVA (cerebrovascular accident) 01/27/2011     , PTA 05/17/2021, 12:05 PM   [facility] [address] [place], [place], [address] Phone: [phone]   Fax:  [phone]  Name: Cleona Sellitto MRN: [id number] Date of Birth: 02/23/1961    

## 2021-05-25 ENCOUNTER — Encounter: Payer: Self-pay | Admitting: Rehabilitation

## 2021-05-25 ENCOUNTER — Ambulatory Visit: Payer: Medicare (Managed Care) | Attending: Podiatry | Admitting: Rehabilitation

## 2021-05-25 ENCOUNTER — Other Ambulatory Visit: Payer: Self-pay

## 2021-05-25 DIAGNOSIS — R2681 Unsteadiness on feet: Secondary | ICD-10-CM | POA: Diagnosis present

## 2021-05-25 DIAGNOSIS — R2689 Other abnormalities of gait and mobility: Secondary | ICD-10-CM | POA: Diagnosis not present

## 2021-05-25 DIAGNOSIS — M6281 Muscle weakness (generalized): Secondary | ICD-10-CM | POA: Diagnosis present

## 2021-05-25 DIAGNOSIS — R296 Repeated falls: Secondary | ICD-10-CM | POA: Insufficient documentation

## 2021-05-25 NOTE — Therapy (Signed)
[facility] [address] [place], [place], [address] Phone: [phone]   Fax:  [phone]  Physical Therapy Treatment  Patient Details  Name: Sandra [name] MRN: [id number] Date of Birth: 25-Jul-1960 Referring Provider (PT): Newton Pigg, DPM   Encounter Date: 05/25/2021   PT End of Session - 05/25/21 1506     Visit Number 8    Number of Visits 15   per updated POC   Date for PT Re-Evaluation 07/09/21   per updated POC   Authorization Type [employer] (10th visit progress note)    Progress Note Due on Visit 10    PT Start Time 1408    PT Stop Time 1450    PT Time Calculation (min) 42 min    Equipment Utilized During Treatment Gait belt    Activity Tolerance Patient tolerated treatment well    Behavior During Therapy WFL for tasks assessed/performed             Past Medical History:  Diagnosis Date   Acute right MCA stroke ([place]) 2006   Acute R MCA stroked  with L arm and leg weakness   Anxiety 1980s   On Klonipin since age 82. Had addiction problem with Xanax which  Was therefore d/c . Seen in the past at [facility].    Arthritis 04/16/2011   Blind right eye    CAD (coronary artery disease) 2007   istory of MI with stent in 2007 by Dr. Chancy Milroy, [facility]. Stent placement by Dr Cleatis Polka    Chronic pain    Depression 03/07/2011   Fracture of fifth toe, left, closed 01/28/2011   History of left fifth toe proximal phalanx fracture when patient had a stroke (01/2011) and fell. Seen by Dr Doran Durand.     Fractured toe 01/2011   History of left fifth toe proximal phalanx fracture when patient had a stroke (01/2011) and fell. Seen by Dr Doran Durand.    Hyperlipidemia 03/07/2011   Hypertension 03/07/2011   Incontinence 04/14/2011   Myocardial infarct, old 2008   stents   PFO (patent foramen ovale) August 2012   PFO seen on TEE during hospitalization in 01/2011.  Patient to f/u with Dr. Leonie Man, neurology, for  enrollment in trial for medical treatment of PFO   Pneumonia    Prediabetes 03/07/2011   Psoriasis 03/07/2011   Substance abuse ([place])    h/o narcotic abuse pt denies as of 01/09/12   Tobacco abuse 03/07/2011   Quit 01/2011     Past Surgical History:  Procedure Laterality Date   BIOPSY THYROID     CARDIAC CATHETERIZATION     cardiac stent     2008   CHOLECYSTECTOMY N/A 07/28/2017   Procedure: LAPAROSCOPIC CHOLECYSTECTOMY WITH INTRAOPERATIVE CHOLANGIOGRAM;  Surgeon: Jovita Kussmaul, MD;  Location: [facility];  Service: General;  Laterality: N/A;   EYE SURGERY     IR GENERIC HISTORICAL  03/16/2016   IR RADIOLOGIST EVAL & MGMT 03/16/2016 Marybelle Killings, MD [facility] INTERV RAD    There were no vitals filed for this visit.   Subjective Assessment - 05/25/21 1410     Subjective Reports that shoe is not on correctly.  She also reports she fell twice, dumping small trashcan into larger one outside of door and again in house.    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 Yes    Pain Score 1     Pain Location Foot    Pain Orientation Left    Pain Descriptors / Indicators Aching    Pain Type Chronic pain    Pain Onset More than a month ago    Pain Frequency Intermittent    Aggravating Factors  brace not being on correctly    Pain Relieving Factors rest                               [facility] Adult PT Treatment/Exercise - 05/25/21 1422       Ambulation/Gait   Ambulation/Gait Yes    Ambulation/Gait Assistance 5: Supervision;4: Min guard    Ambulation/Gait Assistance Details S with use of RW throughout session with AFO.  Min/guard without AD during TUG testing.    Ambulation Distance (Feet) 150 Feet    Assistive device Rolling walker;1 person hand held assist    Gait Pattern Step-through pattern;Decreased step length - right;Decreased step length  - left;Decreased stance time - left;Decreased weight shift to left;Decreased hip/knee flexion - left    Ambulation Surface Level;Indoor    Gait velocity 2.0 ft/sec with RW and AFO      Standardized Balance Assessment   Standardized Balance Assessment Berg Balance Test;Timed Up and Go Test      Berg Balance Test   Sit to Stand Able to stand without using hands and stabilize independently    Standing Unsupported Able to stand safely 2 minutes    Sitting with Back Unsupported but Feet Supported on Floor or Stool Able to sit safely and securely 2 minutes    Stand to Sit Sits safely with minimal use of hands    Transfers Able to transfer safely, minor use of hands    Standing Unsupported with Eyes Closed Able to stand 10 seconds safely    Standing Ubsupported with Feet Together Able to place feet together independently and stand 1 minute safely    From Standing, Reach Forward with Outstretched Arm Can reach forward >12 cm safely (5")    From Standing Position, Pick up Object from Floor Able to pick up shoe safely and easily    From Standing Position, Turn to Look Behind Over each Shoulder Looks behind from both sides and weight shifts well    Turn 360 Degrees Able to turn 360 degrees safely but slowly    Standing Unsupported, Alternately Place Feet on Step/Stool Able to complete >2 steps/needs minimal assist    Standing Unsupported, One Foot in Front Able to plae foot ahead of the other independently and hold 30 seconds    Standing on One Leg Tries to lift leg/unable to hold 3 seconds but remains standing independently    Total Score 46      Timed Up and Go Test   TUG Normal TUG    Normal TUG (seconds) 16.66   without AD, 24.28 with RW                    PT Education - 05/25/21 1506     Education Details Adding more visits for extended POC    Person(s) Educated Patient    Methods Explanation    Comprehension Verbalized understanding              PT Short Term Goals -  05/10/21 1042       PT SHORT  TERM GOAL #1   Title Pt will initiate HEP in order to indicate improved functional mobility and dec fall risk.  (Target Date: 04/29/21)    Baseline Met, 04/2121    Time 4    Period Weeks    Status Achieved    Target Date 04/29/21      PT SHORT TERM GOAL #2   Title Pt will perform BERG balance test and improve score 4 points from baseline in order to indicate dec fall risk.    Baseline 05/10/21 41/56    Time 4    Period Weeks    Status Achieved      PT SHORT TERM GOAL #3   Title Pt will ambulate x 150' with AFO (with device if needed) at mod I level in order to indicate safe household ambulation.    Baseline Met with RW and L AFO donned. 05/10/21    Time 4    Period Weeks    Status Achieved      PT SHORT TERM GOAL #4   Title Pt will improve gait speed to >/=1.8 ft/sec with AFO and device (if needed) in order to indicate dec fall risk.    Baseline Met, 2.18 ft/sec with RW and L AFO donned. 05/10/21    Time 4    Period Weeks    Status Achieved      PT SHORT TERM GOAL #5   Title Pt will improve TUG to </=21 secs with AFO (device if needed) at S level in order to indicate dec fall risk.    Baseline Pt with RW and L AFO 21.31sec; pt with AFO only (no HHA) 18.97 sec.  05/10/21    Time 4    Period Weeks    Status Achieved               PT Long Term Goals - 05/25/21 1433       PT LONG TERM GOAL #1   Title Pt will be IND with final HEP in order to indicate dec fall risk and improved functional mobility.  (Target Date:05/29/21)    Baseline met per pt report    Time 8    Period Weeks    Status Achieved      PT LONG TERM GOAL #2   Title Pt will improve BERG balance score by 8 points from baseline in order to dec fall risk.    Baseline 04/20/21 37/56 to 46/56 on 05/25/21    Time 8    Period Weeks    Status Achieved      PT LONG TERM GOAL #3   Title Pt will ambulate x 500' with AFO/device (if needed) at S level over level indoor and unlevel  paved outdoor surfaces in order to indicate return to community activity with family.    Baseline did not assess due to weather    Time 8    Period Weeks    Status Unable to assess      PT LONG TERM GOAL #4   Title Pt will improve gait speed to >/=2.2 ft/sec w/ AFO and device (if needed)  in order to indicate dec fall risk.    Baseline 2.0 ft/sec with RW and AFO    Time 8    Period Weeks    Status Partially Met      PT LONG TERM GOAL #5   Title Pt will improve TUG to </=16 secs with AFO/device as needed at mod I level in order to indicate  dec fall risk.    Baseline 24.28 with RW and AFO, 16.66 without AD    Time 8    Period Weeks    Status Partially Met               PT Short Term Goals - 05/25/21 1511       PT SHORT TERM GOAL #1   Title Pt will be IND with updated HEP in order to indicate improved functional mobility and dec fall risk.  (Target Date:06/08/21 )    Time 2    Period Weeks    Status Revised    Target Date 06/08/21      PT SHORT TERM GOAL #2   Title Will assess gait with quad tip cane to see if she can manage safely at home.    Time 2    Period Weeks    Status Revised             PT Long Term Goals - 05/25/21 1512       PT LONG TERM GOAL #1   Title Pt will be IND with final HEP in order to indicate dec fall risk and improved functional mobility.  (Target Date:07/09/21)    Time 6    Period Weeks    Status On-going    Target Date 07/09/21      PT LONG TERM GOAL #2   Title Pt will improve BERG balance score by 4 points from baseline in order to dec fall risk.    Baseline 46/56    Time 6    Period Weeks    Status Revised      PT LONG TERM GOAL #3   Title Pt will ambulate x 500' with AFO/device (if needed) at S level over level indoor and unlevel paved outdoor surfaces in order to indicate return to community activity with family.    Time 6    Period Weeks    Status On-going      PT LONG TERM GOAL #4   Title Pt will improve gait speed to  >/=2.6 ft/sec w/ AFO and device (if needed)  in order to indicate dec fall risk.    Baseline 2.0 ft/sec with RW and AFO    Time 6    Period Weeks    Status Revised      PT LONG TERM GOAL #5   Title Pt will improve TUG to </=16 secs with AFO/device as needed at mod I level in order to indicate dec fall risk.    Baseline 24.28 with RW and AFO, 16.66 without AD    Time 6    Period Weeks    Status On-going                  Plan - 05/25/21 1507     Clinical Impression Statement Skilled session focused on assessment of LTGs.  Pt is making steady progress towards goals but feel that she would benefit from continued PT to meet remaining goals and dec fall risk.  Pt has met 2/5 LTGs, partially meeting 2/5 goals and was unable to check outdoor gait goal due to weather.   Will recert for another 6 weeks.    Personal Factors and Comorbidities Comorbidity 3+;Time since onset of injury/illness/exacerbation    Comorbidities see above    Examination-Activity Limitations Locomotion Level;Squat;Lift;Stairs;Stand;Transfers    Examination-Participation Restrictions Community Activity;Driving;Cleaning    Stability/Clinical Decision Making Evolving/Moderate complexity    Rehab Potential Fair    PT Frequency  2x / week   recommended 2x however she will only be seen once a week   PT Duration 8 weeks    PT Treatment/Interventions ADLs/Self Care Home Management;Aquatic Therapy;Electrical Stimulation;DME Instruction;Gait training;Stair training;Functional mobility training;Therapeutic activities;Therapeutic exercise;Balance training;Neuromuscular re-education;Patient/family education;Orthotic Fit/Training;Passive range of motion;Vestibular    PT Next Visit Plan check for follow up with orthotist due to pain in L foot with AFO donned,  work on balance with decreasing UE support (ie performing BUE tasks, weight shifting)    Consulted and Agree with Plan of Care Patient             Patient will benefit  from skilled therapeutic intervention in order to improve the following deficits and impairments:  Abnormal gait, Decreased activity tolerance, Decreased balance, Decreased endurance, Decreased mobility, Decreased range of motion, Decreased strength, Impaired perceived functional ability, Impaired flexibility, Impaired UE functional use, Postural dysfunction, Pain (pain will likely be addressed through bracing)  Visit Diagnosis: Other abnormalities of gait and mobility  Muscle weakness (generalized)  Unsteadiness on feet  Repeated falls     Problem List Patient Active Problem List   Diagnosis Date Noted   Mature cataract 04/06/2020   Visual field defect 04/06/2020   Hyperopia of left eye with astigmatism and presbyopia 04/06/2020   Phthisis bulbi of right eye 04/06/2020   Colon polyps 08/21/2017   Symptomatic cholelithiasis 04/26/2017   Undiagnosed cardiac murmurs 06/24/2016   Morbid obesity ([place]) 06/05/2015   GAD (generalized anxiety disorder) 04/08/2015   Spastic hemiplegia affecting nondominant side ([place]) 04/02/2015   Benzodiazepine withdrawal with complication ([place]) 54/56/2563   Hypokalemia 12/23/2013   Cold intolerance 11/25/2013   Recurrent pneumonia 11/06/2013   MRSA bacteremia 11/06/2013   Bilateral endophthalmitis 89/37/3428   Metabolic syndrome 76/81/1572   Routine adult health maintenance 08/13/2013   Dyspareunia, female 02/06/2013   Lumbar and sacral osteoarthritis 12/06/2012   Diarrhea 11/15/2012   Insomnia 07/23/2012   HIV exposure 06/27/2012   Gout 01/31/2012   Benzodiazepine dependence, continuous ([place]) 01/26/2012    Class: Chronic   Hot flashes 01/09/2012   Tobacco abuse 01/09/2012   Tobacco dependence syndrome 01/09/2012   Arthritis 04/16/2011   Anxiety 04/14/2011   Incontinence 04/14/2011   PFO (patent foramen ovale) 03/07/2011   CAD (coronary artery disease) 03/07/2011   Essential hypertension 03/07/2011   Hyperlipidemia 03/07/2011   Depression  03/07/2011   Psoriasis 03/07/2011   H/O: CVA (cerebrovascular accident) 01/27/2011    Cameron Sprang, PT, MPT [facility] [address] [place], [place], [address] Phone: [phone]   Fax:  [phone] 05/25/21, 3:15 PM   Name: Sandra [name] MRN: [id number] Date of Birth: 10-Apr-1961

## 2021-05-25 NOTE — Addendum Note (Signed)
Addended by: Cameron Sprang A on: 05/25/2021 03:21 PM   Modules accepted: Orders

## 2021-06-03 ENCOUNTER — Ambulatory Visit: Payer: Medicare (Managed Care) | Admitting: Physical Therapy

## 2021-06-07 ENCOUNTER — Ambulatory Visit: Payer: Medicare (Managed Care) | Admitting: Physical Therapy

## 2021-06-22 ENCOUNTER — Ambulatory Visit: Payer: Medicare HMO | Admitting: Physical Therapy

## 2021-06-29 ENCOUNTER — Ambulatory Visit: Payer: Medicare HMO | Admitting: Rehabilitation

## 2021-07-06 ENCOUNTER — Ambulatory Visit: Payer: Medicare HMO | Admitting: Rehabilitation

## 2021-07-13 ENCOUNTER — Ambulatory Visit: Payer: Medicare (Managed Care) | Admitting: Rehabilitation

## 2021-07-28 ENCOUNTER — Other Ambulatory Visit: Payer: Self-pay

## 2021-07-28 ENCOUNTER — Emergency Department (HOSPITAL_COMMUNITY): Payer: Medicare (Managed Care)

## 2021-07-28 ENCOUNTER — Encounter (HOSPITAL_COMMUNITY): Payer: Self-pay | Admitting: Emergency Medicine

## 2021-07-28 ENCOUNTER — Emergency Department (HOSPITAL_COMMUNITY)
Admission: EM | Admit: 2021-07-28 | Discharge: 2021-07-28 | Disposition: A | Discharge status: Home / Self Care | Payer: Medicare (Managed Care) | Attending: Emergency Medicine | Admitting: Emergency Medicine

## 2021-07-28 DIAGNOSIS — Z20822 Contact with and (suspected) exposure to covid-19: Secondary | ICD-10-CM | POA: Insufficient documentation

## 2021-07-28 DIAGNOSIS — K5190 Ulcerative colitis, unspecified, without complications: Secondary | ICD-10-CM | POA: Diagnosis not present

## 2021-07-28 DIAGNOSIS — Z9049 Acquired absence of other specified parts of digestive tract: Secondary | ICD-10-CM | POA: Insufficient documentation

## 2021-07-28 DIAGNOSIS — R1030 Lower abdominal pain, unspecified: Secondary | ICD-10-CM | POA: Diagnosis not present

## 2021-07-28 DIAGNOSIS — W19XXXA Unspecified fall, initial encounter: Secondary | ICD-10-CM | POA: Diagnosis not present

## 2021-07-28 DIAGNOSIS — M79605 Pain in left leg: Secondary | ICD-10-CM | POA: Insufficient documentation

## 2021-07-28 DIAGNOSIS — I2510 Atherosclerotic heart disease of native coronary artery without angina pectoris: Secondary | ICD-10-CM | POA: Diagnosis not present

## 2021-07-28 DIAGNOSIS — R5383 Other fatigue: Secondary | ICD-10-CM | POA: Diagnosis not present

## 2021-07-28 DIAGNOSIS — R112 Nausea with vomiting, unspecified: Secondary | ICD-10-CM | POA: Insufficient documentation

## 2021-07-28 DIAGNOSIS — S62613A Displaced fracture of proximal phalanx of left middle finger, initial encounter for closed fracture: Secondary | ICD-10-CM

## 2021-07-28 DIAGNOSIS — R197 Diarrhea, unspecified: Secondary | ICD-10-CM | POA: Diagnosis not present

## 2021-07-28 DIAGNOSIS — R296 Repeated falls: Secondary | ICD-10-CM | POA: Diagnosis not present

## 2021-07-28 DIAGNOSIS — R531 Weakness: Secondary | ICD-10-CM | POA: Diagnosis not present

## 2021-07-28 DIAGNOSIS — S63283A Dislocation of proximal interphalangeal joint of left middle finger, initial encounter: Secondary | ICD-10-CM | POA: Diagnosis not present

## 2021-07-28 DIAGNOSIS — S6992XA Unspecified injury of left wrist, hand and finger(s), initial encounter: Secondary | ICD-10-CM | POA: Diagnosis present

## 2021-07-28 DIAGNOSIS — S62313A Displaced fracture of base of third metacarpal bone, left hand, initial encounter for closed fracture: Secondary | ICD-10-CM | POA: Insufficient documentation

## 2021-07-28 DIAGNOSIS — R6883 Chills (without fever): Secondary | ICD-10-CM | POA: Insufficient documentation

## 2021-07-28 LAB — CBC
HCT: 38.9 % (ref 36.0–46.0)
Hemoglobin: 13.2 g/dL (ref 12.0–15.0)
MCH: 30.5 pg (ref 26.0–34.0)
MCHC: 33.9 g/dL (ref 30.0–36.0)
MCV: 89.8 fL (ref 80.0–100.0)
Platelets: 293 10*3/uL (ref 150–400)
RBC: 4.33 MIL/uL (ref 3.87–5.11)
RDW: 14.7 % (ref 11.5–15.5)
WBC: 10.6 10*3/uL — ABNORMAL HIGH (ref 4.0–10.5)
nRBC: 0 % (ref 0.0–0.2)

## 2021-07-28 LAB — MAGNESIUM: Magnesium: 1.8 mg/dL (ref 1.7–2.4)

## 2021-07-28 LAB — URINALYSIS, ROUTINE W REFLEX MICROSCOPIC
Bilirubin Urine: NEGATIVE
Glucose, UA: NEGATIVE mg/dL
Hgb urine dipstick: NEGATIVE
Ketones, ur: NEGATIVE mg/dL
Leukocytes,Ua: NEGATIVE
Nitrite: NEGATIVE
Protein, ur: NEGATIVE mg/dL
Specific Gravity, Urine: >1.046 — ABNORMAL HIGH (ref 1.005–1.030)
pH: 6 (ref 5.0–8.0)

## 2021-07-28 LAB — C DIFFICILE QUICK SCREEN W PCR REFLEX
C Diff antigen: NEGATIVE
C Diff interpretation: NOT DETECTED
C Diff toxin: NEGATIVE

## 2021-07-28 LAB — COMPREHENSIVE METABOLIC PANEL
ALT: 18 U/L (ref 0–44)
AST: 17 U/L (ref 15–41)
Albumin: 3.5 g/dL (ref 3.5–5.0)
Alkaline Phosphatase: 117 U/L (ref 38–126)
Anion gap: 9 (ref 5–15)
BUN: 14 mg/dL (ref 6–20)
CO2: 18 mmol/L — ABNORMAL LOW (ref 22–32)
Calcium: 8.6 mg/dL — ABNORMAL LOW (ref 8.9–10.3)
Chloride: 112 mmol/L — ABNORMAL HIGH (ref 98–111)
Creatinine, Ser: 0.4 mg/dL — ABNORMAL LOW (ref 0.44–1.00)
GFR, Estimated: >60 mL/min (ref >60)
Glucose, Bld: 116 mg/dL — ABNORMAL HIGH (ref 70–99)
Potassium: 3.1 mmol/L — ABNORMAL LOW (ref 3.5–5.1)
Sodium: 139 mmol/L (ref 135–145)
Total Bilirubin: 0.4 mg/dL (ref 0.3–1.2)
Total Protein: 6.2 g/dL — ABNORMAL LOW (ref 6.5–8.1)

## 2021-07-28 LAB — RESP PANEL BY RT-PCR (FLU A&B, COVID) ARPGX2
Influenza A by PCR: NEGATIVE
Influenza B by PCR: NEGATIVE
SARS Coronavirus 2 by RT PCR: NEGATIVE

## 2021-07-28 LAB — LIPASE, BLOOD: Lipase: 30 U/L (ref 11–51)

## 2021-07-28 MED ORDER — METAMUCIL SMOOTH TEXTURE 58.6 % PO POWD: 1.0000 | Freq: Every day | ORAL | 12 refills | Status: DC

## 2021-07-28 MED ORDER — METAMUCIL SMOOTH TEXTURE 58.6 % PO POWD: 1.0000 | Freq: Every day | ORAL | 1 refills | Status: AC

## 2021-07-28 MED ORDER — LIDOCAINE HCL 2 % IJ SOLN
10.0000 mL | Freq: Once | INTRAMUSCULAR | Status: AC
Start: 2021-07-28 — End: 2021-07-28
  Administered 2021-07-28: 200 mg via INTRADERMAL
  Filled 2021-07-28: qty 20

## 2021-07-28 MED ORDER — PSYLLIUM 95 % PO PACK
1.0000 | PACK | Freq: Every day | ORAL | Status: DC
  Administered 2021-07-28: 1 via ORAL
  Filled 2021-07-28: qty 1

## 2021-07-28 MED ORDER — LACTATED RINGERS IV BOLUS
1000.0000 mL | Freq: Once | INTRAVENOUS | Status: AC
  Administered 2021-07-28: 1000 mL via INTRAVENOUS

## 2021-07-28 MED ORDER — IOHEXOL 300 MG/ML  SOLN
100.0000 mL | Freq: Once | INTRAMUSCULAR | Status: AC | PRN
  Administered 2021-07-28: 100 mL via INTRAVENOUS

## 2021-07-28 MED ORDER — LOPERAMIDE HCL 2 MG PO CAPS: 2.0000 mg | ORAL_CAPSULE | Freq: Four times a day (QID) | ORAL | 0 refills | Status: DC | PRN

## 2021-07-28 MED ORDER — HYDROCODONE-ACETAMINOPHEN 5-325 MG PO TABS
1.0000 | ORAL_TABLET | Freq: Once | ORAL | Status: AC
  Administered 2021-07-28: 1 via ORAL
  Filled 2021-07-28: qty 1

## 2021-07-28 MED ORDER — POTASSIUM CHLORIDE CRYS ER 20 MEQ PO TBCR
40.0000 meq | EXTENDED_RELEASE_TABLET | Freq: Once | ORAL | Status: AC
  Administered 2021-07-28: 40 meq via ORAL
  Filled 2021-07-28: qty 2

## 2021-07-28 NOTE — ED Notes (Signed)
Pt to xray at this time.

## 2021-07-28 NOTE — ED Notes (Signed)
Pt called out and requested to be placed on the bedpan as she felt she might have a BM.  Pt finger splint had come off.  Placed back on injured finger and anchored with coban.  Family member remains at the bedside.

## 2021-07-28 NOTE — ED Notes (Signed)
Pt ambulated to door in room and back to bed  with holding writers hands for support.

## 2021-07-28 NOTE — ED Provider Notes (Addendum)
[facility] DEPT Provider Note   CSN: [id number] Arrival date & time: 07/28/21  1110     History  Chief Complaint  Patient presents with   Leg Pain   Abdominal Pain   Diarrhea    Sandra [name] is a 61 y.o. female.   Leg Pain Associated symptoms: fatigue   Abdominal Pain Associated symptoms: chills, diarrhea, fatigue, nausea and vomiting   Diarrhea Associated symptoms: abdominal pain, arthralgias, chills and vomiting   Patient presents for multiple complaints.  Currently, she lives alone.  She states that she has a history of ulcerative colitis.  She was previously maintained on sulfasalazine which she reports became no longer effective.  She has been prescribed mesalamine which she no longer takes.  She has had diarrhea and lower abdominal pain for the past week.  She feels that it is reminiscent of a UC flare.  Additionally, she has had multiple falls.  She has a history of CVA with residual left-sided deficits at baseline.  She endorses increased generalized weakness over the past 2 weeks which has led to falls.  2 weeks ago, she had a fall in which she feels she may have injured her proximal left leg as well as her middle left finger.  She has family that lives nearby to her.  Often times when she falls she is unable to get up off of the ground.  She called family to help her to her feet.  She has had intermittent nausea.  She had 1 episode of vomiting in the past week.  She has not had any subjective fevers but does endorse chills.  She presents to the emergency department with her sister due to this multitude of symptoms.  Per chart review, she has a history of CAD, HLD, depression, anxiety, benzodiazepine dependence, gout, obesity, and arthritis.    Home Medications Prior to Admission medications   Medication Sig Start Date End Date Taking? Authorizing Provider  allopurinol (ZYLOPRIM) 300 MG tablet TAKE 1 TABLET BY MOUTH ONCE DAILY 08/04/17   Carollee Herter, Yvonne R, DO  amLODipine (NORVASC) 10 MG tablet Take 10 mg by mouth daily.    [provider]  amoxicillin-clavulanate (AUGMENTIN) 500-125 MG tablet Take 1 tablet (500 mg total) by mouth 3 (three) times daily. Patient not taking: Reported on 03/30/2021 01/04/18   Sheard, Myeong O, DPM  atropine 1 % ophthalmic solution Place 1 drop into the right eye daily.  03/20/15   [provider]  CALCIUM-VITAMIN D PO Take 1 tablet by mouth daily. Patient not taking: Reported on 03/30/2021    [provider]  cloNIDine (CATAPRES) 0.2 MG tablet Take 1 tablet (0.2 mg total) by mouth daily. Please schedule annual appt with Dr. Stanford Breed for refills. [phone]. 1st attempt. 07/29/19   Lelon Perla, MD  clopidogrel (PLAVIX) 75 MG tablet Take 1 tablet (75 mg total) by mouth daily. 03/14/17   Ann Held, DO  clopidogrel (PLAVIX) 75 MG tablet Take by mouth.    [provider]  HYDROcodone-acetaminophen (NORCO/VICODIN) 5-325 MG tablet Take 1 tablet by mouth 2 (two) times daily as needed for moderate pain.    [provider]  HYDROcodone-acetaminophen (NORCO/VICODIN) 5-325 MG tablet Take 1-2 tablets by mouth every 6 (six) hours as needed for moderate pain or severe pain. 07/28/17   Jovita Kussmaul, MD  losartan (COZAAR) 100 MG tablet Take 1 tablet (100 mg total) by mouth daily. 01/03/17   Ann Held, DO  mesalamine (LIALDA) 1.2 g EC tablet Take 1.2 g by mouth 2 (two) times daily with a meal. Breakfast and dinner Patient not taking: Reported on 03/30/2021    [provider]  potassium chloride (MICRO-K) 10 MEQ CR capsule Take 2 capsules (20 mEq total) by mouth daily. 10/18/16   Ann Held, DO  prednisoLONE acetate (PRED FORTE) 1 % ophthalmic suspension Place 1 drop into the right eye 2 (two) times daily.     [provider]  rosuvastatin (CRESTOR) 40 MG tablet TAKE 1 TABLET BY MOUTH ONCE DAILY SCHEDULE  AN  APPOINTMENT  FOR   FUTURE  REFILLS 02/21/19   Lelon Perla, MD  spironolactone (ALDACTONE) 25 MG tablet TAKE 1 TABLET BY MOUTH ONCE DAILY 05/01/17   Ann Held, DO      Allergies    Azor [amlodipine-olmesartan] and Trintellix [vortioxetine]    Review of Systems   Review of Systems  Constitutional:  Positive for chills and fatigue.  Gastrointestinal:  Positive for abdominal pain, diarrhea, nausea and vomiting.  Musculoskeletal:  Positive for arthralgias.  Neurological:  Positive for weakness (Generalized).   Physical Exam Updated Vital Signs BP 121/66 (BP Location: Left Arm)    Pulse 76    Temp 98.1 F (36.7 C) (Oral)    Resp 16    Ht 5\' 5"  (1.651 m)    Wt 78 kg    SpO2 100%    BMI 28.62 kg/m  Physical Exam Vitals and nursing note reviewed.  Constitutional:      General: She is not in acute distress.    Appearance: She is well-developed. She is not ill-appearing, toxic-appearing or diaphoretic.  HENT:     Head: Normocephalic and atraumatic.     Mouth/Throat:     Mouth: Mucous membranes are moist.     Pharynx: Oropharynx is clear.  Eyes:     General: No scleral icterus.    Extraocular Movements: Extraocular movements intact.     Conjunctiva/sclera: Conjunctivae normal.  Cardiovascular:     Rate and Rhythm: Normal rate and regular rhythm.     Heart sounds: No murmur heard. Pulmonary:     Effort: Pulmonary effort is normal. No respiratory distress.     Breath sounds: Normal breath sounds. No wheezing or rales.  Chest:     Chest wall: No tenderness.  Abdominal:     Palpations: Abdomen is soft.     Tenderness: There is abdominal tenderness in the suprapubic area. There is no right CVA tenderness, left CVA tenderness, guarding or rebound.  Musculoskeletal:        General: No swelling.     Cervical back: Neck supple.  Skin:    General: Skin is warm and dry.     Capillary Refill: Capillary refill takes less than 2 seconds.  Neurological:     General: No focal deficit present.      Mental Status: She is alert and oriented to person, place, and time.  Psychiatric:        Mood and Affect: Mood is not anxious or depressed.        Behavior: Behavior normal.    ED Results / Procedures / Treatments   Labs (all labs ordered are listed, but only abnormal results are displayed) Labs Reviewed  COMPREHENSIVE METABOLIC PANEL - Abnormal; Notable for the following components:      Result Value   Potassium 3.1 (*)    Chloride 112 (*)    CO2 18 (*)  Glucose, Bld 116 (*)    Creatinine, Ser 0.40 (*)    Calcium 8.6 (*)    Total Protein 6.2 (*)    All other components within normal limits  CBC - Abnormal; Notable for the following components:   WBC 10.6 (*)    All other components within normal limits  GASTROINTESTINAL PANEL BY PCR, STOOL (REPLACES STOOL CULTURE)  C DIFFICILE QUICK SCREEN W PCR REFLEX    RESP PANEL BY RT-PCR (FLU A&B, COVID) ARPGX2  LIPASE, BLOOD  MAGNESIUM  URINALYSIS, ROUTINE W REFLEX MICROSCOPIC    EKG None  Radiology CT ABDOMEN PELVIS W CONTRAST  Result Date: 07/28/2021 CLINICAL DATA:  Abdominal pain, acute, nonlocalized. Diarrhea for 6 days. History of ulcerative colitis, cholecystitis and cholecystectomy. EXAM: CT ABDOMEN AND PELVIS WITH CONTRAST TECHNIQUE: Multidetector CT imaging of the abdomen and pelvis was performed using the standard protocol following bolus administration of intravenous contrast. RADIATION DOSE REDUCTION: This exam was performed according to the departmental dose-optimization program which includes automated exposure control, adjustment of the mA and/or kV according to patient size and/or use of iterative reconstruction technique. CONTRAST:  110mL OMNIPAQUE IOHEXOL 300 MG/ML  SOLN COMPARISON:  Abdominopelvic CT 06/19/2018 FINDINGS: Lower chest: Clear lung bases. No significant pleural or pericardial effusion. Aortic and coronary artery atherosclerosis. Hepatobiliary: The liver is normal in density without suspicious focal  abnormality. No biliary dilatation status post cholecystectomy. Pancreas: Unremarkable. No pancreatic ductal dilatation or surrounding inflammatory changes. Spleen: Normal in size without focal abnormality. Adrenals/Urinary Tract: Both adrenal glands appear normal. Small low-density renal lesions are noted, measuring up to 10 mm in the interpolar region of the left kidney. These have mildly enlarged from the previous study, although are likely cysts. No enhancing renal mass, urinary tract calculus or hydronephrosis demonstrated. The bladder appears unremarkable for its degree of distention. Stomach/Bowel: No enteric contrast administered. The stomach appears unremarkable for its degree of distension. No evidence of bowel wall thickening, distention or surrounding inflammatory change. The appendix appears normal. Liquid stool noted in the distal colon compatible with diarrheal state. Vascular/Lymphatic: There are no enlarged abdominal or pelvic lymph nodes. Aortic and branch vessel atherosclerosis without acute vascular findings. Reproductive: Heterogeneously enhancing fibroid in the right uterine body measuring 2.9 cm on image 70/2 is similar to the previous study. No adnexal mass. Probable pelvic floor laxity. Other: No evidence of abdominal wall mass or hernia. No ascites. Musculoskeletal: No acute or significant osseous findings. Chronic burst fracture at L1 with osseous retropulsion is unchanged. There is an interval superior endplate compression deformity at L4 without acute features. Multilevel facet arthropathy contributes to a degenerative grade 1 anterolisthesis and mild foraminal narrowing at L4-5. IMPRESSION: 1. No definite acute findings or signs of active colitis. Liquid stool in the distal colon as can be seen in a diarrheal state of various etiologies. The appendix appears normal. 2. Pelvic floor laxity and uterine fibroid again noted. 3. Coronary and Aortic Atherosclerosis (ICD10-I70.0).  Electronically Signed   By: Richardean Sale M.D.   On: 07/28/2021 14:19   DG Finger Middle Left  Result Date: 07/28/2021 CLINICAL DATA:  Status post reduction of an impacted and subluxed fracture of the left 3rd PIP joint. EXAM: LEFT MIDDLE FINGER 2+V COMPARISON:  Earlier today. FINDINGS: On the PA and oblique images, the previously described impacted, subluxed fracture of the base of the 3rd middle phalanx is unchanged. In the true lateral projection, the major fragments are unchanged. There is amorphous ossific density at the previous location of  the proximal ventral fragment, possibly due to differences in positioning. Associated soft tissue swelling is again demonstrated. IMPRESSION: No significant change in the previously described impacted fracture/subluxation at the 3rd PIP joint. Electronically Signed   By: Claudie Revering M.D.   On: 07/28/2021 16:39   DG Finger Middle Left  Result Date: 07/28/2021 CLINICAL DATA:  Fall, left finger injury EXAM: LEFT MIDDLE FINGER 2+V COMPARISON:  None. FINDINGS: Dislocation of the left long finger PIP joint in a dorsal-ulnar direction. Acute mildly displaced fracture of the base of the ring finger middle phalanx. Diffuse soft tissue swelling. IMPRESSION: Acute fracture-dislocation of the left long finger PIP joint. Electronically Signed   By: Davina Poke D.O.   On: 07/28/2021 13:59   DG Femur Min 2 Views Left  Result Date: 07/28/2021 CLINICAL DATA:  Fall, left leg pain EXAM: LEFT FEMUR 2 VIEWS COMPARISON:  None. FINDINGS: There is no evidence of fracture or other focal bone lesions. Alignment at the hip and knee are maintained. Scattered vascular calcifications. Soft tissues are unremarkable. IMPRESSION: Negative. Electronically Signed   By: Davina Poke D.O.   On: 07/28/2021 14:03    Procedures .Nerve Block  Date/Time: 07/28/2021 4:27 PM Performed by: Godfrey Pick, MD Authorized by: Godfrey Pick, MD   Consent:    Consent obtained:  Verbal   Consent  given by:  Patient   Risks, benefits, and alternatives were discussed: yes     Risks discussed:  Allergic reaction, infection, bleeding and intravenous injection   Alternatives discussed:  No treatment Universal protocol:    Procedure explained and questions answered to patient or proxy's satisfaction: yes     Imaging studies available: yes     Patient identity confirmed:  Verbally with patient Indications:    Indications:  Procedural anesthesia and pain relief Location:    Body area:  Upper extremity   Upper extremity nerve blocked: Digital.   Laterality:  Left Pre-procedure details:    Skin preparation:  Povidone-iodine   Preparation: Patient was prepped and draped in usual sterile fashion   Skin anesthesia:    Skin anesthesia method:  None Procedure details:    Block needle gauge:  24 G   Anesthetic injected:  Lidocaine 2% w/o epi   Steroid injected:  None   Additive injected:  None   Injection procedure:  Anatomic landmarks identified   Paresthesia:  None Post-procedure details:    Dressing:  None   Outcome:  Anesthesia achieved   Procedure completion:  Tolerated well, no immediate complications .Ortho Injury Treatment  Date/Time: 07/28/2021 4:28 PM Performed by: Godfrey Pick, MD Authorized by: Godfrey Pick, MD   Consent:    Consent obtained:  Verbal   Consent given by:  Patient   Risks discussed:  Irreducible dislocation, recurrent dislocation, stiffness and nerve damage   Alternatives discussed:  No treatmentInjury location: finger Location details: left long finger Injury type: fracture-dislocation Fracture type: middle phalanx MCP joint involved: no Any IP joint involved: yes Pre-procedure neurovascular assessment: neurovascularly intact Pre-procedure distal perfusion: normal Pre-procedure neurological function: normal Pre-procedure range of motion: reduced Anesthesia: digital block  Anesthesia: Local anesthesia used: yes Local Anesthetic: lidocaine 2%  without epinephrine Anesthetic total: 5 mL  Patient sedated: NoManipulation performed: yes Skin traction used: yes Reduction successful: no Immobilization: splint Splint type: static finger Splint Applied by: ED Provider Supplies used: aluminum splint Post-procedure neurovascular assessment: post-procedure neurovascularly intact Post-procedure distal perfusion: normal Post-procedure neurological function: normal Post-procedure range of motion: unchanged  Medications Ordered in ED Medications  lactated ringers bolus 1,000 mL (0 mLs Intravenous Stopped 07/28/21 1644)  HYDROcodone-acetaminophen (NORCO/VICODIN) 5-325 MG per tablet 1 tablet (1 tablet Oral Given 07/28/21 1428)  iohexol (OMNIPAQUE) 300 MG/ML solution 100 mL (100 mLs Intravenous Contrast Given 07/28/21 1349)  potassium chloride SA (KLOR-CON M) CR tablet 40 mEq (40 mEq Oral Given 07/28/21 1638)  lidocaine (XYLOCAINE) 2 % (with pres) injection 200 mg (200 mg Intradermal Given 07/28/21 1556)  lactated ringers bolus 1,000 mL (1,000 mLs Intravenous New Bag/Given 07/28/21 1644)    ED Course/ Medical Decision Making/ A&P                           Medical Decision Making Amount and/or Complexity of Data Reviewed Labs: ordered. Radiology: ordered.  Risk Prescription drug management.   This patient presents to the ED for concern of abdominal pain and diarrhea, this involves an extensive number of treatment options, and is a complaint that carries with it a high risk of complications and morbidity.  The differential diagnosis includes UC flare, UTI, enteritis, C. difficile, malabsorption   Co morbidities that complicate the patient evaluation  CAD, HLD, depression, anxiety, gout, obesity, and arthritis   Additional history obtained:  Additional history obtained from patient's sister External records from outside source obtained and reviewed including EMR   Lab Tests:  I Ordered, and personally interpreted labs.  The  pertinent results include: Very slight leukocytosis, hypokalemia, metabolic acidosis, (remaining labs pending at time of signout)   Imaging Studies ordered:  I ordered imaging studies including x-ray of left femur and left middle finger; CT scan of abdomen and pelvis I independently visualized and interpreted imaging which showed patient's middle finger has a PIP fracture/dislocation.  Femur x-ray was unremarkable.  CT scan of abdomen and pelvis showed liquid stool consistent with diarrheal illness. I agree with the radiologist interpretation   Cardiac Monitoring:  The patient was maintained on a cardiac monitor.  I personally viewed and interpreted the cardiac monitored which showed an underlying rhythm of: Sinus rhythm   Medicines ordered and prescription drug management:  I ordered medication including potassium chloride for hypokalemia; IV fluids for dehydration Reevaluation of the patient after these medicines showed that the patient improved I have reviewed the patients home medicines and have made adjustments as needed  Problem List / ED Course:  61 year old female presenting to the ED with multiple complaints.  She does live alone and has had multiple recent falls.  This is in the setting of persistent diarrhea over the past week.  She endorses pain in her lower abdomen as well as her left anterior proximal leg.  She had a fall 2 weeks ago, at which time she believes that she injured her left leg.  She has been ambulatory on it, however she has acute on chronic unsteadiness.  Additionally, she had a injury to her left lower finger 2 weeks ago during that same fall.  She has had persistent swelling and decreased range of motion to that finger.  On exam, pertinent findings include some muscular skin lesions on her abdomen.  She states these are where she picks at her skin.  They do not follow a dermatomal pattern.  They are not significantly tender.  Her left middle finger has significant  swelling with deformity.  The distal perfusion is diminished.  X-ray imaging was obtained which showed a PIP fracture/dislocation.  Patient was consented for digital nerve block  with manual reduction.  This was attempted at bedside but I was unable to reduce.  Given the timeline, patient's fracture dislocation is likely fused in it's current position.  Splint was applied for comfort.  Patient's area of leg pain is in the proximal anterior leg.  She has mild tenderness here.  Strength and range of motion are intact.  She does have worsening of pain with hip flexion.  This does not appear to be significantly discomforting to her.  X-ray of femur was without acute findings.  Patient also underwent a CT scan of her abdomen and pelvis.  There were no skeletal pelvic findings.  She does have age-indeterminate lumbar spine compression fractures.  She is tender in these areas but she states that it is consistent with her chronic low back pain and tenderness.  Given her persistent diarrhea, patient was given IV fluids.  Laboratory work-up was initiated.  Patient was found to be hypokalemic and replacement potassium was given in the ED.  At time of signout, stool and urine studies were pending acquisition of a sample. Care of patient was signed out to oncoming ED provider.   Reevaluation:  After the interventions noted above, I reevaluated the patient and found that they have :improved   Social Determinants of Health:  Poor mobility at baseline, lives alone   Dispostion:  After consideration of the diagnostic results and the patients response to treatment, I feel that the patent would benefit from reassessment.          Final Clinical Impression(s) / ED Diagnoses Final diagnoses:  Diarrhea, unspecified type  Fall, initial encounter    Rx / DC Orders ED Discharge Orders     None         Godfrey Pick, MD 07/28/21 1714    Godfrey Pick, MD 07/28/21 [phone]

## 2021-07-28 NOTE — ED Triage Notes (Signed)
BIB EMS from home, reports UC flare-up w/ abdominal pain and diarrhea x1 week, had bowel incontinence w/ EMS. Had a fall about 1 week ago, has L upper leg pain from this. Ambulated w/ 2 person assist w/ EMS.   Vitals w/ EMS BP 118/72 HR 60s O2 100% RA CBG 156

## 2021-07-28 NOTE — Discharge Instructions (Signed)
Take the fiber and Imodium to help with your diarrhea.  Follow-up with the hand surgeon for further evaluation of your finger injury.  Follow-up with your GI doctor as soon as possible

## 2021-07-28 NOTE — ED Notes (Signed)
Please note that pt was given d/c paperwork but she is waiting for her ride.  She called her sister and left a message.  Pt helped to put on 2 gowns as she states that her clothing is soiled.  IV was removed.  Pt up to [facility] to have bm, green diarrhea.  Call bell in reach, advised pt to call for help once she is done

## 2021-07-28 NOTE — ED Provider Triage Note (Signed)
Emergency Medicine Provider Triage Evaluation Note  Sandra [name] , a 61 y.o. female  was evaluated in triage.  Pt complains of 6 days of diarrhea hx of UC (not on meds). Has seen a GI MD at [facility] medical before.   States hx of cholecystitis/sp cholecystectomy.   Review of Systems  Positive: Diarrhea  Negative: Fever, travel, nausea or vomiting.  Physical Exam  BP (!) 138/112 (BP Location: Left Arm)    Pulse 61    Temp 97.8 F (36.6 C) (Oral)    Resp 18    Ht 5\' 5"  (1.651 m)    Wt 78 kg    SpO2 92%    BMI 28.62 kg/m  Gen:   Awake, no distress  Resp:  Normal effort  MSK:   Moves extremities without difficulty  Other:  No abd TTP  Medical Decision Making  Medically screening exam initiated at 11:31 AM.  Appropriate orders placed.  Annick Dimaio was informed that the remainder of the evaluation will be completed by another provider, this initial triage assessment does not replace that evaluation, and the importance of remaining in the ED until their evaluation is complete.  ABD labs.    Tedd Sias, [place] 07/28/21 1134

## 2021-07-28 NOTE — ED Notes (Signed)
Pt states that her pharmacy is [facility] on [address] in [place]. Changed this in the computer and advised Md

## 2021-07-28 NOTE — ED Provider Notes (Signed)
Patient was initially seen by Dr. Doren Custard.  Please see his note.  Patient's labs and imaging tests were reviewed.  CT scan does not show any acute abnormality other than diarrhea illness.  Head CT did not show any acute findings.  Patient does not appear to have a urinary tract infection.  C. difficile test is negative.  She does have additional stool studies but they are currently pending.  Patient is able to eat and drink.  She has been hydrated.  Otherwise appears stable for discharge and outpatient follow-up.  Evaluation and diagnostic testing in the emergency department does not suggest an emergent condition requiring admission or immediate intervention beyond what has been performed at this time.  The patient is safe for discharge and has been instructed to return immediately for worsening symptoms, change in symptoms or any other concerns.    Dorie Rank, MD 07/28/21 2114

## 2021-07-29 LAB — GASTROINTESTINAL PANEL BY PCR, STOOL (REPLACES STOOL CULTURE)

## 2021-08-11 ENCOUNTER — Other Ambulatory Visit: Payer: Self-pay

## 2021-08-11 ENCOUNTER — Inpatient Hospital Stay (HOSPITAL_COMMUNITY): Payer: Medicare (Managed Care)

## 2021-08-11 ENCOUNTER — Inpatient Hospital Stay (HOSPITAL_COMMUNITY)
Admission: EM | Admit: 2021-08-11 | Discharge: 2021-08-20 | DRG: 177 | Disposition: A | Discharge status: Skilled Nursing Facility | Payer: Medicare (Managed Care) | Attending: Family Medicine | Admitting: Family Medicine

## 2021-08-11 ENCOUNTER — Emergency Department (HOSPITAL_COMMUNITY): Payer: Medicare (Managed Care)

## 2021-08-11 ENCOUNTER — Encounter (HOSPITAL_COMMUNITY): Payer: Self-pay

## 2021-08-11 DIAGNOSIS — Z955 Presence of coronary angioplasty implant and graft: Secondary | ICD-10-CM

## 2021-08-11 DIAGNOSIS — E785 Hyperlipidemia, unspecified: Secondary | ICD-10-CM | POA: Diagnosis present

## 2021-08-11 DIAGNOSIS — J9601 Acute respiratory failure with hypoxia: Secondary | ICD-10-CM

## 2021-08-11 DIAGNOSIS — B370 Candidal stomatitis: Secondary | ICD-10-CM | POA: Diagnosis present

## 2021-08-11 DIAGNOSIS — L89153 Pressure ulcer of sacral region, stage 3: Secondary | ICD-10-CM | POA: Diagnosis present

## 2021-08-11 DIAGNOSIS — W19XXXA Unspecified fall, initial encounter: Secondary | ICD-10-CM | POA: Diagnosis present

## 2021-08-11 DIAGNOSIS — H5461 Unqualified visual loss, right eye, normal vision left eye: Secondary | ICD-10-CM | POA: Diagnosis present

## 2021-08-11 DIAGNOSIS — J1282 Pneumonia due to coronavirus disease 2019: Secondary | ICD-10-CM | POA: Diagnosis present

## 2021-08-11 DIAGNOSIS — S62623A Displaced fracture of medial phalanx of left middle finger, initial encounter for closed fracture: Secondary | ICD-10-CM | POA: Diagnosis present

## 2021-08-11 DIAGNOSIS — S62603A Fracture of unspecified phalanx of left middle finger, initial encounter for closed fracture: Secondary | ICD-10-CM | POA: Diagnosis present

## 2021-08-11 DIAGNOSIS — I10 Essential (primary) hypertension: Secondary | ICD-10-CM | POA: Diagnosis present

## 2021-08-11 DIAGNOSIS — U071 COVID-19: Principal | ICD-10-CM

## 2021-08-11 DIAGNOSIS — F32A Depression, unspecified: Secondary | ICD-10-CM | POA: Diagnosis present

## 2021-08-11 DIAGNOSIS — T148XXA Other injury of unspecified body region, initial encounter: Secondary | ICD-10-CM

## 2021-08-11 DIAGNOSIS — Z8701 Personal history of pneumonia (recurrent): Secondary | ICD-10-CM

## 2021-08-11 DIAGNOSIS — Z833 Family history of diabetes mellitus: Secondary | ICD-10-CM

## 2021-08-11 DIAGNOSIS — G9341 Metabolic encephalopathy: Secondary | ICD-10-CM | POA: Diagnosis present

## 2021-08-11 DIAGNOSIS — M25552 Pain in left hip: Secondary | ICD-10-CM

## 2021-08-11 DIAGNOSIS — I2510 Atherosclerotic heart disease of native coronary artery without angina pectoris: Secondary | ICD-10-CM | POA: Diagnosis present

## 2021-08-11 DIAGNOSIS — L409 Psoriasis, unspecified: Secondary | ICD-10-CM | POA: Diagnosis present

## 2021-08-11 DIAGNOSIS — Z79899 Other long term (current) drug therapy: Secondary | ICD-10-CM

## 2021-08-11 DIAGNOSIS — E876 Hypokalemia: Secondary | ICD-10-CM

## 2021-08-11 DIAGNOSIS — L03317 Cellulitis of buttock: Secondary | ICD-10-CM | POA: Diagnosis present

## 2021-08-11 DIAGNOSIS — R109 Unspecified abdominal pain: Secondary | ICD-10-CM

## 2021-08-11 DIAGNOSIS — G8929 Other chronic pain: Secondary | ICD-10-CM | POA: Diagnosis present

## 2021-08-11 DIAGNOSIS — Z8673 Personal history of transient ischemic attack (TIA), and cerebral infarction without residual deficits: Secondary | ICD-10-CM | POA: Diagnosis not present

## 2021-08-11 DIAGNOSIS — H44521 Atrophy of globe, right eye: Secondary | ICD-10-CM | POA: Diagnosis present

## 2021-08-11 DIAGNOSIS — Z888 Allergy status to other drugs, medicaments and biological substances status: Secondary | ICD-10-CM

## 2021-08-11 DIAGNOSIS — M1990 Unspecified osteoarthritis, unspecified site: Secondary | ICD-10-CM | POA: Diagnosis present

## 2021-08-11 DIAGNOSIS — G629 Polyneuropathy, unspecified: Secondary | ICD-10-CM | POA: Diagnosis present

## 2021-08-11 DIAGNOSIS — K5190 Ulcerative colitis, unspecified, without complications: Secondary | ICD-10-CM | POA: Diagnosis present

## 2021-08-11 DIAGNOSIS — Q2112 Patent foramen ovale: Secondary | ICD-10-CM | POA: Diagnosis not present

## 2021-08-11 DIAGNOSIS — F17210 Nicotine dependence, cigarettes, uncomplicated: Secondary | ICD-10-CM | POA: Diagnosis present

## 2021-08-11 DIAGNOSIS — R443 Hallucinations, unspecified: Secondary | ICD-10-CM | POA: Diagnosis present

## 2021-08-11 DIAGNOSIS — Z8249 Family history of ischemic heart disease and other diseases of the circulatory system: Secondary | ICD-10-CM | POA: Diagnosis not present

## 2021-08-11 DIAGNOSIS — L8990 Pressure ulcer of unspecified site, unspecified stage: Secondary | ICD-10-CM | POA: Diagnosis present

## 2021-08-11 DIAGNOSIS — F419 Anxiety disorder, unspecified: Secondary | ICD-10-CM | POA: Diagnosis present

## 2021-08-11 DIAGNOSIS — R296 Repeated falls: Secondary | ICD-10-CM

## 2021-08-11 DIAGNOSIS — R4182 Altered mental status, unspecified: Secondary | ICD-10-CM | POA: Diagnosis present

## 2021-08-11 DIAGNOSIS — S72002S Fracture of unspecified part of neck of left femur, sequela: Secondary | ICD-10-CM

## 2021-08-11 DIAGNOSIS — I252 Old myocardial infarction: Secondary | ICD-10-CM

## 2021-08-11 DIAGNOSIS — R7303 Prediabetes: Secondary | ICD-10-CM | POA: Diagnosis present

## 2021-08-11 DIAGNOSIS — Z7902 Long term (current) use of antithrombotics/antiplatelets: Secondary | ICD-10-CM

## 2021-08-11 LAB — TROPONIN I (HIGH SENSITIVITY): Troponin I (High Sensitivity): 26 ng/L — ABNORMAL HIGH (ref <18)

## 2021-08-11 LAB — CBG MONITORING, ED: Glucose-Capillary: 177 mg/dL — ABNORMAL HIGH (ref 70–99)

## 2021-08-11 LAB — CBC WITH DIFFERENTIAL/PLATELET
Abs Immature Granulocytes: 0.06 10*3/uL (ref 0.00–0.07)
Basophils Absolute: 0 10*3/uL (ref 0.0–0.1)
Basophils Relative: 0 %
Eosinophils Absolute: 0 10*3/uL (ref 0.0–0.5)
Eosinophils Relative: 0 %
HCT: 38.1 % (ref 36.0–46.0)
Hemoglobin: 12.8 g/dL (ref 12.0–15.0)
Immature Granulocytes: 1 %
Lymphocytes Relative: 9 %
Lymphs Abs: 0.9 10*3/uL (ref 0.7–4.0)
MCH: 29.8 pg (ref 26.0–34.0)
MCHC: 33.6 g/dL (ref 30.0–36.0)
MCV: 88.8 fL (ref 80.0–100.0)
Monocytes Absolute: 0.7 10*3/uL (ref 0.1–1.0)
Monocytes Relative: 7 %
Neutro Abs: 8.3 10*3/uL — ABNORMAL HIGH (ref 1.7–7.7)
Neutrophils Relative %: 83 %
Platelets: 224 10*3/uL (ref 150–400)
RBC: 4.29 MIL/uL (ref 3.87–5.11)
RDW: 14.5 % (ref 11.5–15.5)
WBC: 9.9 10*3/uL (ref 4.0–10.5)
nRBC: 0 % (ref 0.0–0.2)

## 2021-08-11 LAB — COMPREHENSIVE METABOLIC PANEL
ALT: 15 U/L (ref 0–44)
AST: 15 U/L (ref 15–41)
Albumin: 2.6 g/dL — ABNORMAL LOW (ref 3.5–5.0)
Alkaline Phosphatase: 105 U/L (ref 38–126)
Anion gap: 12 (ref 5–15)
BUN: 17 mg/dL (ref 6–20)
CO2: 26 mmol/L (ref 22–32)
Calcium: 8 mg/dL — ABNORMAL LOW (ref 8.9–10.3)
Chloride: 107 mmol/L (ref 98–111)
Creatinine, Ser: 0.55 mg/dL (ref 0.44–1.00)
GFR, Estimated: >60 mL/min (ref >60)
Glucose, Bld: 163 mg/dL — ABNORMAL HIGH (ref 70–99)
Potassium: 2.4 mmol/L — CL (ref 3.5–5.1)
Sodium: 145 mmol/L (ref 135–145)
Total Bilirubin: 0.7 mg/dL (ref 0.3–1.2)
Total Protein: 6.2 g/dL — ABNORMAL LOW (ref 6.5–8.1)

## 2021-08-11 LAB — BRAIN NATRIURETIC PEPTIDE: B Natriuretic Peptide: 311.8 pg/mL — ABNORMAL HIGH (ref 0.0–100.0)

## 2021-08-11 LAB — C-REACTIVE PROTEIN: CRP: 14.4 mg/dL — ABNORMAL HIGH (ref <1.0)

## 2021-08-11 LAB — PROCALCITONIN: Procalcitonin: 0.16 ng/mL

## 2021-08-11 LAB — RESP PANEL BY RT-PCR (FLU A&B, COVID) ARPGX2
Influenza A by PCR: NEGATIVE
Influenza B by PCR: NEGATIVE
SARS Coronavirus 2 by RT PCR: POSITIVE — AB

## 2021-08-11 LAB — ETHANOL: Alcohol, Ethyl (B): <10 mg/dL (ref <10)

## 2021-08-11 LAB — D-DIMER, QUANTITATIVE: D-Dimer, Quant: 1.04 ug/mL-FEU — ABNORMAL HIGH (ref 0.00–0.50)

## 2021-08-11 LAB — C DIFFICILE QUICK SCREEN W PCR REFLEX
C Diff antigen: NEGATIVE
C Diff interpretation: NOT DETECTED
C Diff toxin: NEGATIVE

## 2021-08-11 LAB — FERRITIN: Ferritin: 93 ng/mL (ref 11–307)

## 2021-08-11 LAB — MAGNESIUM: Magnesium: 2.1 mg/dL (ref 1.7–2.4)

## 2021-08-11 LAB — FIBRINOGEN: Fibrinogen: 769 mg/dL — ABNORMAL HIGH (ref 210–475)

## 2021-08-11 MED ORDER — POTASSIUM CHLORIDE 20 MEQ PO PACK
40.0000 meq | PACK | ORAL | Status: AC
  Administered 2021-08-11 – 2021-08-12 (×2): 40 meq via ORAL
  Filled 2021-08-11 (×2): qty 2

## 2021-08-11 MED ORDER — SODIUM CHLORIDE 0.9 % IV SOLN
200.0000 mg | Freq: Once | INTRAVENOUS | Status: AC
  Administered 2021-08-11: 200 mg via INTRAVENOUS
  Filled 2021-08-11: qty 40

## 2021-08-11 MED ORDER — SODIUM CHLORIDE 0.9 % IV SOLN
100.0000 mg | Freq: Every day | INTRAVENOUS | Status: AC
  Administered 2021-08-12 – 2021-08-15 (×4): 100 mg via INTRAVENOUS
  Filled 2021-08-11 (×4): qty 20

## 2021-08-11 MED ORDER — ATROPINE SULFATE 1 % OP SOLN
1.0000 [drp] | Freq: Every day | OPHTHALMIC | Status: DC
  Administered 2021-08-12 – 2021-08-20 (×10): 1 [drp] via OPHTHALMIC
  Filled 2021-08-11: qty 2

## 2021-08-11 MED ORDER — CLOPIDOGREL BISULFATE 75 MG PO TABS
75.0000 mg | ORAL_TABLET | Freq: Every day | ORAL | Status: DC
  Administered 2021-08-11 – 2021-08-20 (×10): 75 mg via ORAL
  Filled 2021-08-11 (×10): qty 1

## 2021-08-11 MED ORDER — PREDNISOLONE ACETATE 1 % OP SUSP
1.0000 [drp] | Freq: Two times a day (BID) | OPHTHALMIC | Status: DC
  Administered 2021-08-12 – 2021-08-20 (×19): 1 [drp] via OPHTHALMIC
  Filled 2021-08-11: qty 5

## 2021-08-11 MED ORDER — POTASSIUM CHLORIDE 10 MEQ/100ML IV SOLN
10.0000 meq | INTRAVENOUS | Status: AC
  Administered 2021-08-11: 10 meq via INTRAVENOUS
  Filled 2021-08-11: qty 100

## 2021-08-11 MED ORDER — LACTATED RINGERS IV BOLUS
1000.0000 mL | Freq: Once | INTRAVENOUS | Status: AC
  Administered 2021-08-11: 1000 mL via INTRAVENOUS

## 2021-08-11 MED ORDER — ONDANSETRON HCL 4 MG PO TABS: 4.0000 mg | ORAL_TABLET | Freq: Four times a day (QID) | ORAL | Status: DC | PRN

## 2021-08-11 MED ORDER — ALLOPURINOL 300 MG PO TABS
300.0000 mg | ORAL_TABLET | Freq: Every day | ORAL | Status: DC
Start: 2021-08-11 — End: 2021-08-21
  Administered 2021-08-11 – 2021-08-20 (×10): 300 mg via ORAL
  Filled 2021-08-11 (×10): qty 1

## 2021-08-11 MED ORDER — METHYLPREDNISOLONE SODIUM SUCC 125 MG IJ SOLR
80.0000 mg | Freq: Two times a day (BID) | INTRAMUSCULAR | Status: DC
  Administered 2021-08-11 – 2021-08-13 (×5): 80 mg via INTRAVENOUS
  Filled 2021-08-11 (×5): qty 2

## 2021-08-11 MED ORDER — ASCORBIC ACID 500 MG PO TABS
500.0000 mg | ORAL_TABLET | Freq: Every day | ORAL | Status: DC
  Administered 2021-08-11 – 2021-08-20 (×10): 500 mg via ORAL
  Filled 2021-08-11 (×10): qty 1

## 2021-08-11 MED ORDER — QUETIAPINE FUMARATE 50 MG PO TABS
50.0000 mg | ORAL_TABLET | Freq: Once | ORAL | Status: AC
  Administered 2021-08-11: 50 mg via ORAL
  Filled 2021-08-11: qty 1

## 2021-08-11 MED ORDER — SODIUM CHLORIDE 0.9 % IV SOLN
500.0000 mg | Freq: Once | INTRAVENOUS | Status: AC
  Administered 2021-08-11: 500 mg via INTRAVENOUS
  Filled 2021-08-11: qty 5

## 2021-08-11 MED ORDER — HYDROCODONE-ACETAMINOPHEN 5-325 MG PO TABS
1.0000 | ORAL_TABLET | Freq: Four times a day (QID) | ORAL | Status: DC | PRN
  Administered 2021-08-11 – 2021-08-20 (×25): 1 via ORAL
  Filled 2021-08-11 (×25): qty 1

## 2021-08-11 MED ORDER — POTASSIUM CHLORIDE 10 MEQ/100ML IV SOLN
10.0000 meq | INTRAVENOUS | Status: AC
  Administered 2021-08-11 (×2): 10 meq via INTRAVENOUS
  Filled 2021-08-11 (×2): qty 100

## 2021-08-11 MED ORDER — ZINC SULFATE 220 (50 ZN) MG PO CAPS
220.0000 mg | ORAL_CAPSULE | Freq: Every day | ORAL | Status: DC
  Administered 2021-08-11 – 2021-08-20 (×10): 220 mg via ORAL
  Filled 2021-08-11 (×10): qty 1

## 2021-08-11 MED ORDER — IPRATROPIUM-ALBUTEROL 20-100 MCG/ACT IN AERS
1.0000 | INHALATION_SPRAY | Freq: Four times a day (QID) | RESPIRATORY_TRACT | Status: DC
  Administered 2021-08-12 – 2021-08-15 (×13): 1 via RESPIRATORY_TRACT
  Filled 2021-08-11: qty 4

## 2021-08-11 MED ORDER — ACETAMINOPHEN 325 MG PO TABS
650.0000 mg | ORAL_TABLET | Freq: Four times a day (QID) | ORAL | Status: DC | PRN
  Administered 2021-08-15: 650 mg via ORAL
  Filled 2021-08-11: qty 2

## 2021-08-11 MED ORDER — POTASSIUM CHLORIDE CRYS ER 20 MEQ PO TBCR
40.0000 meq | EXTENDED_RELEASE_TABLET | Freq: Once | ORAL | Status: DC
  Filled 2021-08-11: qty 2

## 2021-08-11 MED ORDER — SODIUM CHLORIDE 0.9 % IV SOLN
1.0000 g | Freq: Once | INTRAVENOUS | Status: AC
  Administered 2021-08-11: 1 g via INTRAVENOUS
  Filled 2021-08-11: qty 10

## 2021-08-11 MED ORDER — ACETAMINOPHEN 650 MG RE SUPP: 650.0000 mg | Freq: Four times a day (QID) | RECTAL | Status: DC | PRN

## 2021-08-11 MED ORDER — ENOXAPARIN SODIUM 40 MG/0.4ML IJ SOSY
40.0000 mg | PREFILLED_SYRINGE | INTRAMUSCULAR | Status: DC
  Administered 2021-08-11 – 2021-08-20 (×10): 40 mg via SUBCUTANEOUS
  Filled 2021-08-11 (×10): qty 0.4

## 2021-08-11 MED ORDER — LOPERAMIDE HCL 2 MG PO CAPS
2.0000 mg | ORAL_CAPSULE | Freq: Four times a day (QID) | ORAL | Status: DC | PRN
  Administered 2021-08-12 – 2021-08-15 (×4): 2 mg via ORAL
  Filled 2021-08-11 (×4): qty 1

## 2021-08-11 MED ORDER — ONDANSETRON HCL 4 MG/2ML IJ SOLN
4.0000 mg | Freq: Four times a day (QID) | INTRAMUSCULAR | Status: DC | PRN
Start: 2021-08-11 — End: 2021-08-21
  Administered 2021-08-12: 4 mg via INTRAVENOUS
  Filled 2021-08-11: qty 2

## 2021-08-11 MED ORDER — ENSURE ENLIVE PO LIQD
237.0000 mL | Freq: Two times a day (BID) | ORAL | Status: DC
  Administered 2021-08-12 – 2021-08-19 (×7): 237 mL via ORAL

## 2021-08-11 NOTE — Assessment & Plan Note (Addendum)
On Zoloft.

## 2021-08-11 NOTE — Assessment & Plan Note (Addendum)
Body mass index is 28.62 kg/m.  BMI improved significantly.  No morbid obesity right now.

## 2021-08-11 NOTE — Assessment & Plan Note (Addendum)
Corrected.  No more PVC.

## 2021-08-11 NOTE — ED Notes (Signed)
Critical Value: Potassium 2.4

## 2021-08-11 NOTE — ED Provider Notes (Signed)
[facility] DEPT Provider Note   CSN: [id number] Arrival date & time: 08/11/21  1444     History  Chief Complaint  Patient presents with   Altered Mental Status    Possible Sepsis    Sandra [name] is a 61 y.o. female.  HPI 61 year old female presents with generalized weakness, falls, and altered mental status.  History is also taken from the neighbor, who checked on the patient today and she seemed to be "foaming at the mouth", altered, and on her couch covered in feces.  She was confused about where she was and whether she had been to the friend's house.  She has had a decline since a fall about 3 weeks ago.  Her left hip/proximal leg is still bothering her.  She was seen here 2 weeks ago given she was having diarrhea/colitis and her work-up seem to be okay and she was discharged home.  Seems to be getting worse and is weaker to the point she cannot walk.  She is also currently short of breath and states she has a cough.  She tells me this all started today though is unclear when it originally started.  Patient tells me she has been having hallucinations. The abdominal pain she had last ED visit is still present.   Home Medications Prior to Admission medications   Medication Sig Start Date End Date Taking? Authorizing Provider  allopurinol (ZYLOPRIM) 300 MG tablet TAKE 1 TABLET BY MOUTH ONCE DAILY 08/04/17   Carollee Herter, Yvonne R, DO  amLODipine (NORVASC) 10 MG tablet Take 10 mg by mouth daily.    [provider]  amoxicillin-clavulanate (AUGMENTIN) 500-125 MG tablet Take 1 tablet (500 mg total) by mouth 3 (three) times daily. Patient not taking: Reported on 03/30/2021 01/04/18   Sheard, Myeong O, DPM  atropine 1 % ophthalmic solution Place 1 drop into the right eye daily.  03/20/15   [provider]  CALCIUM-VITAMIN D PO Take 1 tablet by mouth daily. Patient not taking: Reported on 03/30/2021    [provider]  cloNIDine (CATAPRES)  0.2 MG tablet Take 1 tablet (0.2 mg total) by mouth daily. Please schedule annual appt with Dr. Stanford Breed for refills. [phone]. 1st attempt. 07/29/19   Lelon Perla, MD  clopidogrel (PLAVIX) 75 MG tablet Take 1 tablet (75 mg total) by mouth daily. 03/14/17   Ann Held, DO  clopidogrel (PLAVIX) 75 MG tablet Take by mouth.    [provider]  HYDROcodone-acetaminophen (NORCO/VICODIN) 5-325 MG tablet Take 1 tablet by mouth 2 (two) times daily as needed for moderate pain.    [provider]  HYDROcodone-acetaminophen (NORCO/VICODIN) 5-325 MG tablet Take 1-2 tablets by mouth every 6 (six) hours as needed for moderate pain or severe pain. 07/28/17   Jovita Kussmaul, MD  loperamide (IMODIUM) 2 MG capsule Take 1 capsule (2 mg total) by mouth 4 (four) times daily as needed for diarrhea or loose stools. 07/28/21   Dorie Rank, MD  losartan (COZAAR) 100 MG tablet Take 1 tablet (100 mg total) by mouth daily. 01/03/17   Ann Held, DO  mesalamine (LIALDA) 1.2 g EC tablet Take 1.2 g by mouth 2 (two) times daily with a meal. Breakfast and dinner Patient not taking: Reported on 03/30/2021    [provider]  potassium chloride (MICRO-K) 10 MEQ CR capsule Take 2 capsules (20 mEq total) by mouth daily. 10/18/16   Roma Schanz R, DO  prednisoLONE acetate (PRED  FORTE) 1 % ophthalmic suspension Place 1 drop into the right eye 2 (two) times daily.     [provider]  psyllium (METAMUCIL SMOOTH TEXTURE) 58.6 % powder Take 1 packet by mouth daily. 07/28/21   Dorie Rank, MD  rosuvastatin (CRESTOR) 40 MG tablet TAKE 1 TABLET BY MOUTH ONCE DAILY SCHEDULE  AN  APPOINTMENT  FOR  FUTURE  REFILLS 02/21/19   Lelon Perla, MD  spironolactone (ALDACTONE) 25 MG tablet TAKE 1 TABLET BY MOUTH ONCE DAILY 05/01/17   Ann Held, DO      Allergies    Azor [amlodipine-olmesartan] and Trintellix [vortioxetine]    Review of Systems   Review of Systems   Constitutional:  Negative for fever.  Respiratory:  Positive for cough and shortness of breath.   Gastrointestinal:  Positive for abdominal pain and diarrhea.  Neurological:  Positive for weakness.   Physical Exam Updated Vital Signs BP 114/63    Pulse 95    Temp 99.8 F (37.7 C) (Rectal)    Resp (!) 23    Ht 5\' 5"  (1.651 m)    Wt 78 kg    SpO2 91%    BMI 28.62 kg/m  Physical Exam Vitals and nursing note reviewed.  Constitutional:      Appearance: She is well-developed.  HENT:     Head: Normocephalic and atraumatic.  Cardiovascular:     Rate and Rhythm: Regular rhythm. Tachycardia present.     Heart sounds: Normal heart sounds.  Pulmonary:     Effort: Pulmonary effort is normal. Tachypnea present.     Breath sounds: Wheezing (slight, diffuse) present.  Abdominal:     Palpations: Abdomen is soft.     Tenderness: There is abdominal tenderness (mild, generalized).  Skin:    General: Skin is warm and dry.     Comments: Patient is covered is feces, especially legs  Neurological:     Mental Status: She is alert.     Comments: Patient is awake, alert, knows she is in the hospital and knows is February 2023.  Does not know the day and thinks it is early February rather than late February.  She has equal strength in all 4 extremities.    ED Results / Procedures / Treatments   Labs (all labs ordered are listed, but only abnormal results are displayed) Labs Reviewed  RESP PANEL BY RT-PCR (FLU A&B, COVID) ARPGX2 - Abnormal; Notable for the following components:      Result Value   SARS Coronavirus 2 by RT PCR POSITIVE (*)    All other components within normal limits  COMPREHENSIVE METABOLIC PANEL - Abnormal; Notable for the following components:   Potassium 2.4 (*)    Glucose, Bld 163 (*)    Calcium 8.0 (*)    Total Protein 6.2 (*)    Albumin 2.6 (*)    All other components within normal limits  CBC WITH DIFFERENTIAL/PLATELET - Abnormal; Notable for the following components:    Neutro Abs 8.3 (*)    All other components within normal limits  CBG MONITORING, ED - Abnormal; Notable for the following components:   Glucose-Capillary 177 (*)    All other components within normal limits  CULTURE, BLOOD (ROUTINE X 2)  CULTURE, BLOOD (ROUTINE X 2)  C DIFFICILE QUICK SCREEN W PCR REFLEX    ETHANOL  MAGNESIUM  URINALYSIS, ROUTINE W REFLEX MICROSCOPIC    EKG EKG Interpretation  Date/Time:  Wednesday August 11 2021 16:13:49 EST Ventricular Rate:  96 PR Interval:  133 QRS Duration: 83 QT Interval:  404 QTC Calculation: 511 R Axis:   15 Text Interpretation: Sinus rhythm Multiform ventricular premature complexes Left atrial enlargement Nonspecific repol abnormality, diffuse leads Prolonged QT interval Confirmed by Sherwood Gambler [phone]) on 08/11/2021 4:24:18 PM  Radiology DG Chest 2 View  Result Date: 08/11/2021 CLINICAL DATA:  Cough EXAM: CHEST - 2 VIEW COMPARISON:  CT 01/28/2019, chest x-ray 06/29/2016 FINDINGS: Patchy diffuse airspace opacity in the left thorax with probable mild ground-glass opacity in the peripheral right upper lobe. Borderline cardiomegaly. No pleural effusion or pneumothorax. IMPRESSION: Findings suspicious for left greater than right pneumonia. Electronically Signed   By: Donavan Foil M.D.   On: 08/11/2021 16:44   CT HEAD WO CONTRAST (5MM)  Result Date: 08/11/2021 CLINICAL DATA:  possible sepsis. Per EMS for the last three weeks she's had a "decline in mobility, fatigue, defecating on herself, and hot to touch". This started to happen after her fall three weeks ago.Mental status change, unknown cause EXAM: CT HEAD WITHOUT CONTRAST TECHNIQUE: Contiguous axial images were obtained from the base of the skull through the vertex without intravenous contrast. RADIATION DOSE REDUCTION: This exam was performed according to the departmental dose-optimization program which includes automated exposure control, adjustment of the mA and/or kV according to  patient size and/or use of iterative reconstruction technique. COMPARISON:  Head CT 07/28/2021 FINDINGS: Brain: Large chronic RIGHT MCA territory infarction again noted. No acute intracranial hemorrhage. No focal mass lesion. No CT evidence of acute infarction. No midline shift or mass effect. No hydrocephalus. Basilar cisterns are patent. Vascular: No hyperdense vessel or unexpected calcification. Skull: Normal. Negative for fracture or focal lesion. Sinuses/Orbits: Fluid in the maxillary sinuses and ethmoid air cells. Calcification of the RIGHT globe Other: None. IMPRESSION: 1. Maxillary sinusitis 2. Large RIGHT MCA territory remote infarction. Electronically Signed   By: Suzy Bouchard M.D.   On: 08/11/2021 16:52    Procedures .Critical Care Performed by: Sherwood Gambler, MD Authorized by: Sherwood Gambler, MD   Critical care provider statement:    Critical care time (minutes):  35   Critical care time was exclusive of:  Separately billable procedures and treating other patients   Critical care was necessary to treat or prevent imminent or life-threatening deterioration of the following conditions:  Cardiac failure and respiratory failure   Critical care was time spent personally by me on the following activities:  Development of treatment plan with patient or surrogate, discussions with consultants, evaluation of patient's response to treatment, examination of patient, ordering and review of laboratory studies, ordering and review of radiographic studies, ordering and performing treatments and interventions, pulse oximetry, re-evaluation of patient's condition and review of old charts    Medications Ordered in ED Medications  potassium chloride SA (KLOR-CON M) CR tablet 40 mEq (has no administration in time range)  potassium chloride 10 mEq in 100 mL IVPB (has no administration in time range)  cefTRIAXone (ROCEPHIN) 1 g in sodium chloride 0.9 % 100 mL IVPB (has no administration in time range)   azithromycin (ZITHROMAX) 500 mg in sodium chloride 0.9 % 250 mL IVPB (has no administration in time range)  lactated ringers bolus 1,000 mL (1,000 mLs Intravenous Bolus 08/11/21 1617)    ED Course/ Medical Decision Making/ A&P                           Medical Decision Making Problems Addressed: Acute respiratory failure with  hypoxia [facility]): acute illness or injury that poses a threat to life or bodily functions COVID-19: acute illness or injury with systemic symptoms that poses a threat to life or bodily functions Hypokalemia: acute illness or injury with systemic symptoms that poses a threat to life or bodily functions  Amount and/or Complexity of Data Reviewed Labs: ordered. Radiology: ordered and independent interpretation performed. ECG/medicine tests: ordered and independent interpretation performed.  Risk Prescription drug management. Decision regarding hospitalization.   Patient presents with respiratory failure requiring supplemental oxygen, at least 3 L.  Chest x-ray images were reviewed and interpreted by myself and seem to show bilateral, left greater than right pneumonia.  Given this she was started on Rocephin and azithromycin.  Later on, after initial consultation for admission and work-up/treatment started, she was found to have a COVID positive PCR.  Thus it is more likely that she actually has COVID-pneumonia rather than bacterial pneumonia and COVID.  She is also found to have hypokalemia with a potassium down to 2.4.  Magnesium is okay.  When I review her ECG, there is a prolonged QTc on my interpretation.  Thus, she was started on IV and oral potassium.  She is continuing to have diarrhea, which was present when she was here last time.  Will order C. difficile testing.  WBC and kidney function is normal and at baseline.  Overall she will need admission for supportive care for the COVID as well as repletion of her potassium while monitoring her ECG intervals.  Discussed  with Dr. Posey Pronto for admission.        Final Clinical Impression(s) / ED Diagnoses Final diagnoses:  Acute respiratory failure with hypoxia ([place])  Hypokalemia  COVID-19    Rx / DC Orders ED Discharge Orders     None         Sherwood Gambler, MD 08/11/21 1757

## 2021-08-11 NOTE — ED Notes (Signed)
Pt on 3L O2

## 2021-08-11 NOTE — Assessment & Plan Note (Addendum)
Patient reports that she has a history of ulcerative colitis and was on sulfasalazine but does not take any medication. Has not seen GI in a while. Last colonoscopy was a few years ago. Currently will be on IV steroids which should be helpful for possible flareup of colitis. CT abdomen on 2/8 when she initially presented with diarrhea does not show any evidence of acute inflammation. Patient follows up with Dr. Alan Ripper at [facility] for GI.  Recommend close follow-up.   Patient will require ongoing prednisone therapy for this condition and is seen by GI.

## 2021-08-11 NOTE — ED Triage Notes (Signed)
Pt BIB EMS from home for possible sepsis. Per EMS for the last three weeks she's had a "decline in mobility, fatigue, defecating on herself, and hot to touch". This started to happen after her fall three weeks ago.  110 hr  96/42 bp 150 cbg 23 etco2 36 rr  18g in right AC. 541ml of NS was given.

## 2021-08-11 NOTE — Plan of Care (Signed)
  Problem: Respiratory: Goal: Will maintain a patent airway Outcome: Progressing   

## 2021-08-11 NOTE — Assessment & Plan Note (Addendum)
Blood pressure normal. Currently does not require any therapy.

## 2021-08-11 NOTE — Assessment & Plan Note (Addendum)
Hyperlipidemia Continue Plavix, hold Crestor. Follows up with Dr. Einar Gip

## 2021-08-11 NOTE — H&P (Addendum)
History and Physical    Patient: Sandra [name] WUJ:[id number] DOB: 1960-09-04 DOA: 08/11/2021 DOS: the patient was seen and examined on 08/11/2021 PCP: Guadlupe Spanish, MD  Patient coming from: Home  Chief Complaint:  Chief Complaint  Patient presents with   Altered Mental Status    Possible Sepsis    HPI: [name] is a 61 y.o. female with medical history significant of CVA, CAD, HTN, depression, HLD, PFO, active smoker. Patient presents with complaints of progressively worsening lethargy and fatigue as well as muscle cramps. Recently seen in ER on 2/8 with complaints of diarrhea.  Patient reports that she has history of "ulcerative colitis" and follows up with GI.  Gets colonoscopy every 5 years.  But has not seen GI recently and started having complaints of diarrhea for last 3 weeks. Frequency reported as 4-5 in a day without any blood.  Initially no abdominal pain but now reports lower abdominal pain.  Abdominal pain is crampy and continuous in nature. Was hydrated on 2/8, given a dose of Imodium and was referred back to GI but has not seen them. Now presents with complaints of fatigue and tiredness along with ongoing poor oral intake Sister at bedside reports that the patient has been having some cough for last 3 days along with shortness of breath. Patient has been hallucinating about seeing people in the room or being in the wrong location for last few days. Prior to current presentation patient actually had a fall 3 weeks ago and per sister there has been progressive decline since then with her mentation. Sister was recently tested positive for COVID a week ago. Patient also reports having pain in her left hip with minimal movement. Patient has a brace for ankle introversion, that she is not wearing. Patient ambulates with a walker. Patient has sustained left middle finger fracture which was splinted in the ER on 2/8 and patient was referred to orthopedics but has not seen them  since discharge. Patient lately has been refusing to take her night medication her sister but for last couple of days may also have missed her morning medications. No alcohol abuse reported. Patient smokes 1-1/2 pack a day. No drug abuse reported. No fever no chills.  No chest pain.  No nausea no vomiting. Per PDMP review patient has been on chronic Norco 5/325 3 times a day.  Review of Systems: As mentioned in the history of present illness. All other systems reviewed and are negative. Past Medical History:  Diagnosis Date   Acute right MCA stroke ([place]) 2006   Acute R MCA stroked  with L arm and leg weakness   Anxiety 1980s   On Klonipin since age 64. Had addiction problem with Xanax which  Was therefore d/c . Seen in the past at [facility].    Arthritis 04/16/2011   Blind right eye    CAD (coronary artery disease) 2007   istory of MI with stent in 2007 by Dr. Chancy Milroy, [facility]. Stent placement by Dr Cleatis Polka    Chronic pain    Depression 03/07/2011   Fracture of fifth toe, left, closed 01/28/2011   History of left fifth toe proximal phalanx fracture when patient had a stroke (01/2011) and fell. Seen by Dr Doran Durand.     Fractured toe 01/2011   History of left fifth toe proximal phalanx fracture when patient had a stroke (01/2011) and fell. Seen by Dr Doran Durand.    Hyperlipidemia 03/07/2011   Hypertension 03/07/2011   Incontinence 04/14/2011  Myocardial infarct, old 2008   stents   PFO (patent foramen ovale) August 2012   PFO seen on TEE during hospitalization in 01/2011.  Patient to f/u with Dr. Leonie Man, neurology, for enrollment in trial for medical treatment of PFO   Pneumonia    Prediabetes 03/07/2011   Psoriasis 03/07/2011   Substance abuse ([place])    h/o narcotic abuse pt denies as of 01/09/12   Tobacco abuse 03/07/2011   Quit 01/2011    Past Surgical History:  Procedure Laterality Date   BIOPSY THYROID     CARDIAC CATHETERIZATION     cardiac stent     2008    CHOLECYSTECTOMY N/A 07/28/2017   Procedure: LAPAROSCOPIC CHOLECYSTECTOMY WITH INTRAOPERATIVE CHOLANGIOGRAM;  Surgeon: Jovita Kussmaul, MD;  Location: [facility];  Service: General;  Laterality: N/A;   EYE SURGERY     IR GENERIC HISTORICAL  03/16/2016   IR RADIOLOGIST EVAL & MGMT 03/16/2016 Marybelle Killings, MD [facility] INTERV RAD   Social History:  reports that she has been smoking cigarettes. She has a 84.00 pack-year smoking history. She has never used smokeless tobacco. She reports that she does not drink alcohol and does not use drugs.  Allergies  Allergen Reactions   Azor [Amlodipine-Olmesartan] Other (See Comments)    Drops blood pressure too low (takes plain amlodipine at home July 2015)   Trintellix [Vortioxetine] Itching    Family History  Problem Relation Age of Onset   Heart disease Mother    Diabetes Mother    Heart disease Father    Diabetes Father    Cancer Sister 55       ovarian   Heart disease Brother    Diabetes Brother    Heart disease Maternal Grandmother    Heart disease Maternal Grandfather    Heart disease Paternal Grandmother    Heart disease Paternal Grandfather    Lung cancer Brother    Heart disease Brother     Prior to Admission medications   Medication Sig Start Date End Date Taking? Authorizing Provider  acetaminophen (TYLENOL) 500 MG tablet Take 500 mg by mouth every 6 (six) hours as needed for moderate pain.   Yes [provider]  allopurinol (ZYLOPRIM) 300 MG tablet TAKE 1 TABLET BY MOUTH ONCE DAILY Patient taking differently: Take 300 mg by mouth daily. 08/04/17  Yes Roma Schanz R, DO  amLODipine (NORVASC) 10 MG tablet Take 10 mg by mouth daily.   Yes [provider]  atropine 1 % ophthalmic solution Place 1 drop into the right eye daily.  03/20/15  Yes [provider]  CALCIUM-VITAMIN D PO Take 1 tablet by mouth daily.   Yes [provider]  clopidogrel (PLAVIX) 75 MG tablet Take 1 tablet (75 mg total) by mouth  daily. 03/14/17  Yes Roma Schanz R, DO  Cyanocobalamin (VITAMIN B-12 IJ) Inject 1 Dose as directed every 30 (thirty) days. Patient not sure of the dosage.   Yes [provider]  folic acid (FOLVITE) 1 MG tablet Take 1 mg by mouth daily. 06/06/21  Yes [provider]  gabapentin (NEURONTIN) 300 MG capsule Take 300 mg by mouth 3 (three) times daily.   Yes [provider]  HYDROcodone-acetaminophen (NORCO/VICODIN) 5-325 MG tablet Take 1-2 tablets by mouth every 6 (six) hours as needed for moderate pain or severe pain. Patient taking differently: Take 1 tablet by mouth every 4 (four) hours as needed for moderate pain or severe pain. 07/28/17  Yes Autumn Messing  III, MD  hydrOXYzine (VISTARIL) 100 MG capsule Take 100 mg by mouth at bedtime.   Yes [provider]  losartan (COZAAR) 50 MG tablet Take 100 mg by mouth daily. 12/28/20  Yes [provider]  metoprolol tartrate (LOPRESSOR) 100 MG tablet Take 100 mg by mouth 2 (two) times daily.   Yes [provider]  potassium chloride (MICRO-K) 10 MEQ CR capsule Take 2 capsules (20 mEq total) by mouth daily. 10/18/16  Yes Roma Schanz R, DO  prednisoLONE acetate (PRED FORTE) 1 % ophthalmic suspension Place 1 drop into the right eye 2 (two) times daily.    Yes [provider]  QUEtiapine (SEROQUEL) 50 MG tablet Take 50 mg by mouth at bedtime.   Yes [provider]  rosuvastatin (CRESTOR) 40 MG tablet TAKE 1 TABLET BY MOUTH ONCE DAILY SCHEDULE  AN  APPOINTMENT  FOR  FUTURE  REFILLS Patient taking differently: Take 40 mg by mouth at bedtime. 02/21/19  Yes Lelon Perla, MD  sertraline (ZOLOFT) 100 MG tablet Take 100 mg by mouth at bedtime.   Yes [provider]  spironolactone (ALDACTONE) 25 MG tablet TAKE 1 TABLET BY MOUTH ONCE DAILY Patient taking differently: Take 25 mg by mouth daily. 05/01/17  Yes Roma Schanz R, DO  loperamide (IMODIUM) 2 MG capsule Take 1 capsule  (2 mg total) by mouth 4 (four) times daily as needed for diarrhea or loose stools. Patient not taking: Reported on 08/11/2021 07/28/21   Dorie Rank, MD  psyllium (METAMUCIL SMOOTH TEXTURE) 58.6 % powder Take 1 packet by mouth daily. Patient not taking: Reported on 08/11/2021 07/28/21   Dorie Rank, MD    Physical Exam: Vitals:   08/11/21 1515 08/11/21 1600 08/11/21 1612 08/11/21 1700  BP:  114/63    Pulse: (!) 45 92  95  Resp: (!) 29 (!) 29  (!) 23  Temp:   99.8 F (37.7 C)   TempSrc:   Rectal   SpO2: 94% 94%  91%  Weight:      Height:       General: Appear in mild distress, no Rash; Oral Mucosa Clear, moist. no Abnormal Neck Mass Or lumps, Conjunctiva normal  Cardiovascular: S1 and S2 Present, no Murmur, Respiratory: Increased respiratory effort, Bilateral Air entry present and bilateral Crackles, bilateral wheezes Abdomen: Bowel Sound present, Soft and no tenderness Extremities: no Pedal edema, pain with movement of the left hip and knee.  Introverted left ankle, left index finger wrapped in a splint. Neurology: alert and oriented to time, place, and person affect appropriate. no new focal deficit Gait not checked due to patient safety concerns   Data Reviewed:  I have Reviewed nursing notes, Vitals, and Lab results since pt's last encounter. Pertinent lab results CBC, CMP, magnesium I have ordered test including CRP, D-dimer, ferritin, LDH, fibrinogen, procalcitonin, troponin I have independently visualized and interpreted imaging chest x-ray which showed bilateral multifocal pneumonia. I have ordered imaging studies x-ray left hip, x-ray abdomen, x-ray left middle finger. I have independently visualized and interpreted EKG which showed EKG: normal sinus rhythm, nonspecific ST and T waves changes, frequent PVC's noted. I have reviewed the last note from ED provider,  I have discussed pt's care plan and test results with ED provider Dr. Verta Ellen.   Assessment and Plan: * Pneumonia  due to COVID-19 virus- (present on admission) Acute Hypoxic respiratory failure, POA, 80 % on room air on admission secondary to acute COVID-19 Viral Pneumonia CXR: hazy bilateral  peripheral opacities Oxygen requirement: Currently on 4 L of oxygen CRP: Pending Remdesivir: Started on 2/22 Steroids: 1 mg/kg IV Solu-Medrol started on 2/22 Baricitinib/Actemra: Currently holding off on initiation until further work-up Antibiotics: Received 1 dose of IV ceftriaxone azithromycin in the ER procalcitonin currently pending. Overall plan: Presented with cough and shortness of breath as well as fatigue and poor p.o. intake progressively worsening over last 3 weeks. Suspect acute worsening for last 3 days secondary to COVID-19 infection.  Prognosis guarded. Isolation duration: First positive test date 08/11/2021, last day of isolation 08/20/2021  Hypokalemia- (present on admission) Severe with PVC. Monitor on telemetry. Replacing aggressively with IV. Magnesium levels normal.  Reported ulcerative colitis ([place])- (present on admission) Patient reports that she has a history of ulcerative colitis and was on sulfasalazine but does not take any medication. Has not seen GI in a while. Last colonoscopy was a few years ago. Currently will be on IV steroids which should be helpful for possible flareup of colitis. CT abdomen on 2/8 when she initially presented with diarrhea does not show any evidence of acute inflammation.  Unwitnessed fall- (present on admission) Subacute left hip pain. Closed fracture of left middle finger. Currently her left middle finger is splinted. Patient still has some pain. We will check x-ray. Patient was referred to orthopedic, may require inpatient evaluation. Patient also has left hip pain.  We will get another x-ray of the hip. CT abdomen and pelvis as well as x-ray hip in the past on 2/8 was negative for any acute fracture. PT OT consult.  Acute metabolic encephalopathy-  (present on admission) Hallucination. Sister at bedside reports that the patient has been hallucinating for the last few days. Mentation at the time of my evaluation appears to be adequate suspect this is all in the setting of hypoxia. Monitor for now.  Morbid obesity ([place])- (present on admission) No current weight in the system. Unsure whether the reported weight is accurate. We will monitor.  H/O: CVA (cerebrovascular accident) On Plavix. CT of the head this admission as well as prior ER visit shows no acute abnormality.  No focal deficit at the time of my evaluation which is new.  Depression- (present on admission) On Zoloft. Continue to hold, resume once mentation is back to baseline.  Essential hypertension- (present on admission) Blood pressure rather soft right now. We will hold home medications.  CAD (coronary artery disease)- (present on admission) Hyperlipidemia Continue Plavix, hold Crestor. Follows up with Dr. Einar Gip    Addendum: On review hip x ray has a fracture. Given her degree of hypoxia, not a candidate for surgery now until stable. So will call Orthopedics tomorrow.  Berle Mull 10:12 PM 08/11/2021     Advance Care Planning:   Code Status: Full Code   Consults: none  Family Communication: Sister at bedside.  Severity of Illness: Patient was initially admitted as observation although further evaluation at bedside suggested the patient is severely hypoxic requiring 5-7 l of oxygen with ongoing pain and diarrhea with poor p.o. intake and frequent PVCs on telemetry in the setting of severe hypokalemia. The appropriate patient status for this patient is INPATIENT. Inpatient status is judged to be reasonable and necessary in order to provide the required intensity of service to ensure the patient's safety. The patient's presenting symptoms, physical exam findings, and initial radiographic and laboratory data in the context of their chronic comorbidities is felt  to place them at high risk for further clinical deterioration. Furthermore, it is not  anticipated that the patient will be medically stable for discharge from the hospital within 2 midnights of admission.   * I certify that at the point of admission it is my clinical judgment that the patient will require inpatient hospital care spanning beyond 2 midnights from the point of admission due to high intensity of service, high risk for further deterioration and high frequency of surveillance required.*  Author: Berle Mull, MD 08/11/2021 7:35 PM  For on call review [email or web address].

## 2021-08-11 NOTE — Assessment & Plan Note (Addendum)
Likely metabolic in the setting of hypoxia in the setting of COVID-19 infection. No dizziness no focal deficit. No other etiology identified.

## 2021-08-11 NOTE — Assessment & Plan Note (Signed)
On Plavix. CT of the head this admission as well as prior ER visit shows no acute abnormality.  No focal deficit at the time of my evaluation which is new.

## 2021-08-11 NOTE — Assessment & Plan Note (Addendum)
Acute Hypoxic respiratory failure, POA, 80 % on room air on admission secondary to acute COVID-19 Viral Pneumonia CXR: hazy bilateral peripheral opacities Oxygen requirement: On 3 L of oxygen.  Dropped down to 89% on 2 L. CRP: Improving Remdesivir: Started on 2/22 Steroids: 1 mg/kg IV Solu-Medrol started on 2/22, switch to oral prednisone 2/25 Baricitinib/Actemra: Currently holding off Antibiotics: Received 1 dose of IV ceftriaxone azithromycin in the ER procalcitonin marginally elevated.  Now on oral doxycycline for cellulitis Isolation duration: First positive test date 08/11/2021, last day of isolation 08/20/2021

## 2021-08-11 NOTE — Assessment & Plan Note (Addendum)
Visual hallucination.  Resolved as of 2/25. Sister reported that the patient has been hallucinating for the last few days prior to admission.  Mentation at baseline. Currently I do not think the patient requires resumption of her gabapentin as there is no significant neuropathic pain component or resumption of Seroquel as there is no agitation or psychotic behaviors.

## 2021-08-12 LAB — COMPREHENSIVE METABOLIC PANEL
ALT: 14 U/L (ref 0–44)
AST: 17 U/L (ref 15–41)
Albumin: 2.4 g/dL — ABNORMAL LOW (ref 3.5–5.0)
Alkaline Phosphatase: 100 U/L (ref 38–126)
Anion gap: 6 (ref 5–15)
BUN: 16 mg/dL (ref 6–20)
CO2: 26 mmol/L (ref 22–32)
Calcium: 7.9 mg/dL — ABNORMAL LOW (ref 8.9–10.3)
Chloride: 112 mmol/L — ABNORMAL HIGH (ref 98–111)
Creatinine, Ser: 0.46 mg/dL (ref 0.44–1.00)
GFR, Estimated: >60 mL/min (ref >60)
Glucose, Bld: 176 mg/dL — ABNORMAL HIGH (ref 70–99)
Potassium: 4.1 mmol/L (ref 3.5–5.1)
Sodium: 144 mmol/L (ref 135–145)
Total Bilirubin: 0.7 mg/dL (ref 0.3–1.2)
Total Protein: 5.9 g/dL — ABNORMAL LOW (ref 6.5–8.1)

## 2021-08-12 LAB — CBC WITH DIFFERENTIAL/PLATELET
Abs Immature Granulocytes: 0.05 10*3/uL (ref 0.00–0.07)
Basophils Absolute: 0 10*3/uL (ref 0.0–0.1)
Basophils Relative: 0 %
Eosinophils Absolute: 0 10*3/uL (ref 0.0–0.5)
Eosinophils Relative: 0 %
HCT: 36.6 % (ref 36.0–46.0)
Hemoglobin: 11.6 g/dL — ABNORMAL LOW (ref 12.0–15.0)
Immature Granulocytes: 1 %
Lymphocytes Relative: 6 %
Lymphs Abs: 0.5 10*3/uL — ABNORMAL LOW (ref 0.7–4.0)
MCH: 29.3 pg (ref 26.0–34.0)
MCHC: 31.7 g/dL (ref 30.0–36.0)
MCV: 92.4 fL (ref 80.0–100.0)
Monocytes Absolute: 0.1 10*3/uL (ref 0.1–1.0)
Monocytes Relative: 2 %
Neutro Abs: 7.1 10*3/uL (ref 1.7–7.7)
Neutrophils Relative %: 91 %
Platelets: 193 10*3/uL (ref 150–400)
RBC: 3.96 MIL/uL (ref 3.87–5.11)
RDW: 14.6 % (ref 11.5–15.5)
WBC: 7.8 10*3/uL (ref 4.0–10.5)
nRBC: 0 % (ref 0.0–0.2)

## 2021-08-12 LAB — C-REACTIVE PROTEIN: CRP: 22.2 mg/dL — ABNORMAL HIGH (ref <1.0)

## 2021-08-12 LAB — MAGNESIUM: Magnesium: 2.2 mg/dL (ref 1.7–2.4)

## 2021-08-12 MED ORDER — GUAIFENESIN-DM 100-10 MG/5ML PO SYRP
5.0000 mL | ORAL_SOLUTION | ORAL | Status: DC | PRN
  Administered 2021-08-12: 5 mL via ORAL
  Filled 2021-08-12: qty 10

## 2021-08-12 MED ORDER — PROSOURCE PLUS PO LIQD
30.0000 mL | Freq: Two times a day (BID) | ORAL | Status: DC
  Administered 2021-08-12 – 2021-08-20 (×11): 30 mL via ORAL
  Filled 2021-08-12 (×10): qty 30

## 2021-08-12 MED ORDER — QUETIAPINE FUMARATE 50 MG PO TABS
50.0000 mg | ORAL_TABLET | Freq: Once | ORAL | Status: AC
  Administered 2021-08-12: 50 mg via ORAL
  Filled 2021-08-12: qty 1

## 2021-08-12 MED ORDER — JUVEN PO PACK
1.0000 | PACK | Freq: Two times a day (BID) | ORAL | Status: DC
  Administered 2021-08-14 – 2021-08-20 (×4): 1 via ORAL
  Filled 2021-08-12 (×18): qty 1

## 2021-08-12 MED ORDER — ADULT MULTIVITAMIN W/MINERALS CH
1.0000 | ORAL_TABLET | Freq: Every day | ORAL | Status: DC
  Administered 2021-08-13 – 2021-08-20 (×8): 1 via ORAL
  Filled 2021-08-12 (×8): qty 1

## 2021-08-12 NOTE — TOC Progression Note (Signed)
Transition of Care [facility]) - Progression Note    Patient Details  Name: Natoshia Souter MRN: [id number] Date of Birth: 02-Apr-1961  Transition of Care [facility]) CM/SW Contact  Purcell Mouton, RN Phone Number: 08/12/2021, 3:41 PM  Clinical Narrative:     Transition of Care (TOC) Screening Note   Patient Details  Name: Oliwia Berzins Date of Birth: 1960-08-05   [facility] of Care [facility]) CM/SW Contact:    Purcell Mouton, RN Phone Number: 08/12/2021, 3:41 PM    Transition of Care Department [facility]) has reviewed patient and no TOC needs have been identified at this time. We will continue to monitor patient advancement through interdisciplinary progression rounds. If new patient transition needs arise, please place a TOC consult.          Expected Discharge Plan and Services                                                 Social Determinants of Health (SDOH) Interventions    Readmission Risk Interventions No flowsheet data found.

## 2021-08-12 NOTE — Progress Notes (Addendum)
Initial Nutrition Assessment  DOCUMENTATION CODES:   Not applicable  INTERVENTION:  - continue Ensure Plus High Protein BID, each supplement provides 350 kcal and 20 grams of protein. - will order 30 ml Prosource Plus BID, each supplement provides 100 kcal and 15 grams protein.  - will order 1 packet Juven BID, each packet provides 95 calories, 2.5 grams of protein (collagen), and 9.8 grams of carbohydrate (3 grams sugar); also contains 7 grams of L-arginine and L-glutamine, 300 mg vitamin C, 15 mg vitamin E, 1.2 mcg vitamin B-12, 9.5 mg zinc, 200 mg calcium, and 1.5 g  Calcium Beta-hydroxy-Beta-methylbutyrate to support wound healing. - will order 1 tablet multivitamin with minerals daily - complete NFPE when feasible.  - weigh today.    NUTRITION DIAGNOSIS:   Increased nutrient needs related to acute illness, catabolic illness (EXHBZ-16 infection) as evidenced by estimated needs.  GOAL:   Patient will meet greater than or equal to 90% of their needs  MONITOR:   PO intake, Supplement acceptance, Labs, Weight trends  REASON FOR ASSESSMENT:   Malnutrition Screening Tool  ASSESSMENT:   61 y.o. female with medical history of CVA, CAD, HTN, depression, HLD, PFO, ulcerative colitis, active smoker. She presented to the ED due to progressively worsening lethargy, fatigue, L hip pain, and muscle cramps. She was seen in the ED on 07/28/21 due to diarrhea and reports this presentation that she has had diarrhea x3 weeks; reports 4-5 episodes/day. Family reported that patient has been having visual hallucinations. She uses a walker for ambulation. She was admitted with dx of PNA due to COVID-19.  No meal completion documentation since admission. Ensure Enlive ordered BID per ONS protocol to start later this AM. She has not been seen by a [place] RD at any time in the past.   Weight yesterday was documented as 172 lb. Weight appears stable since 07/27/2017, but unsure if weight has been  copied forward since that time. No information documented in the edema section of flow sheet this hospitalization.   Per notes: - PNA due to COVID-19 infection - reported hx of UC - unwitnessed fall PTA with L hip pain and L middle finger fracture to be addressed by hand surgery - acute metabolic encephalopathy on admission--improving   Labs reviewed; Cl: 112 mmol/l, Ca: 7.8 mg/dl. Medications reviewed; 500 mg ascorbic acid/day, 80 mg solu-medrol BID, 40 mEq Klor-Con x2 doses 2/22, 200 mg IV remdesivir x1 dose 2/22, 100 mg IV remdesivir x1 dose/day (2/23-2/26), 220 mg zinc sulfate/day.     NUTRITION - FOCUSED PHYSICAL EXAM:  Deferred.   Diet Order:   Diet Order             Diet 2 gram sodium Room service appropriate? Yes; Fluid consistency: Thin  Diet effective now                   EDUCATION NEEDS:   No education needs have been identified at this time  Skin:  Skin Assessment: Skin Integrity Issues: Skin Integrity Issues:: Stage III Stage III: sacrum  Last BM:  2/23 (type 6 x2, one small amount and one medium amount)  Height:   Ht Readings from Last 1 Encounters:  08/11/21 5\' 5"  (1.651 m)    Weight:   Wt Readings from Last 1 Encounters:  08/11/21 78 kg    BMI:  Body mass index is 28.62 kg/m.  Estimated Nutritional Needs:  Kcal:  1900-2100 kcal Protein:  90-105 grams Fluid:  >/= 2.2 L/day  Jarome Matin, MS, RD, LDN Inpatient Clinical Dietitian RD pager # available in [place]  After hours/weekend pager # available in [facility]

## 2021-08-12 NOTE — Progress Notes (Signed)
Intermittent confusion overnight but easy to be reoriented

## 2021-08-12 NOTE — Progress Notes (Signed)
Progress Note   Patient: Sandra [name] IOX:[id number] DOB: 05-Mar-1961 DOA: 08/11/2021     Hospitalization day: 1 DOS: the patient was seen and examined on 08/12/2021   Brief hospital course:  Sandra [name] is a 61 y.o. female with medical history significant of CVA, CAD, HTN, depression, HLD, PFO, active smoker. Patient presents with complaints of progressively worsening lethargy and fatigue as well as muscle cramps. Found to have COVID-19 infection with acute hypoxia along with left hip fracture. Orthopedic and hand surgery both consulted, recommend conservative measures with pain control and outpatient follow-up. Hypoxia currently stabilizing.  Assessment and Plan: * Pneumonia due to COVID-19 virus- (present on admission) Acute Hypoxic respiratory failure, POA, 80 % on room air on admission secondary to acute COVID-19 Viral Pneumonia CXR: hazy bilateral peripheral opacities Oxygen requirement: Currently on 4 L of oxygen CRP: 22.2 Remdesivir: Started on 2/22 Steroids: 1 mg/kg IV Solu-Medrol started on 2/22 Baricitinib/Actemra: Currently holding off on initiation until further work-up Antibiotics: Received 1 dose of IV ceftriaxone azithromycin in the ER procalcitonin marginally elevated.  Holding antibiotics. Overall plan: Presented with cough and shortness of breath as well as fatigue and poor p.o. intake progressively worsening over last 3 weeks. Suspect acute worsening for last 3 days secondary to COVID-19 infection.  Prognosis guarded. Isolation duration: First positive test date 08/11/2021, last day of isolation 08/20/2021  Hypokalemia- (present on admission) PVCs improved.  Resolved hypokalemia.  Monitor.  Reported ulcerative colitis ([place])- (present on admission) Patient reports that she has a history of ulcerative colitis and was on sulfasalazine but does not take any medication. Has not seen GI in a while. Last colonoscopy was a few years ago. Currently will be on IV steroids which  should be helpful for possible flareup of colitis. CT abdomen on 2/8 when she initially presented with diarrhea does not show any evidence of acute inflammation.  Unwitnessed fall- (present on admission) Left hip greater trochanteric fracture, closed Closed fracture of left middle finger. Currently her left middle finger is splinted. Patient still has some pain. Discussed with orthopedic hand surgery, recommend outpatient follow-up as the middle finger appears to be fractured him almost a month ago. Left hip x-ray shows evidence of greater oblique fracture nondisplaced.  Orthopedic consulted Dr. Lorin Mercy.  Recommend conservative management without any intervention, weightbearing touchdown.  We will continue pain control.  PT OT consult.  Acute metabolic encephalopathy- (present on admission) Visual hallucination. Sister at bedside reports that the patient has been hallucinating for the last few days. Mentation improving. Monitor for now.  Morbid obesity ([place])- (present on admission) No current weight in the system. Unsure whether the reported weight is accurate. We will monitor.  H/O: CVA (cerebrovascular accident) On Plavix. CT of the head this admission as well as prior ER visit shows no acute abnormality.  No focal deficit at the time of my evaluation which is new.  Depression- (present on admission) On Zoloft. Continue to hold, resume once mentation is back to baseline.  Essential hypertension- (present on admission) Blood pressure rather soft right now. We will hold home medications.  CAD (coronary artery disease)- (present on admission) Hyperlipidemia Continue Plavix, hold Crestor. Follows up with Dr. Einar Gip        Subjective: Continues to have pain in the leg.  No nausea no vomiting no fever no chills.  Breathing improving.  Cough improving.  Physical Exam: Vitals:   08/12/21 0025 08/12/21 0431 08/12/21 0510 08/12/21 0958  BP: 124/78 130/76  123/76  Pulse: Marland Kitchen)  106 81  89  Resp: 18 20 18 18   Temp: 98.5 F (36.9 C) 97.9 F (36.6 C)  98.5 F (36.9 C)  TempSrc: Oral Oral  Oral  SpO2: 95% 94%  95%  Weight:      Height:       General: Appear in mild distress; no visible Abnormal Neck Mass Or lumps, Conjunctiva normal Cardiovascular: S1 and S2 Present, no Murmur, Respiratory: good respiratory effort, Bilateral Air entry present and CTA, no Crackles, Occasional wheezes Abdomen: Bowel Sound present Extremities: no Pedal edema Neurology: alert and oriented to time, place, and person Gait not checked due to patient safety concerns   Data Reviewed:  I have Reviewed nursing notes, Vitals, and Lab results since pt's last encounter. Pertinent lab results CBC and BMP I have ordered test including CBC and BMP and CRP I have discussed pt's care plan and test results with orthopedic surgery, hand surgery.   Family Communication: Discussed with sister on the phone  Disposition: Status is: Inpatient Remains inpatient appropriate because: Ongoing pain control, still hypoxic  Author: Berle Mull, MD 08/12/2021 8:02 PM  For on call review [email or web address].

## 2021-08-12 NOTE — Progress Notes (Signed)
[name], female   DOB: [birth year], 61 y.o.   MRN: [id number] Called by hospitalist with this 61 year old female with acute COVID COPD active smoker history of CVA CAD depression with finger fracture dislocation several weeks ago supposed to see Dr. Caralyn Guile and left hip pain with x-rays demonstrating nondisplaced greater trochanter fracture on the left.  Patient is on 4 L oxygen.  Hip fracture does not require acute fixation in the setting of acute COVID.  She can follow-up with me in the office in 2 weeks and will need to be on a walker with touchdown weightbearing on the left lower extremity.  Patient has intra-articular fracture dislocation middle finger which will be addressed by hand surgery.

## 2021-08-13 DIAGNOSIS — L03317 Cellulitis of buttock: Secondary | ICD-10-CM | POA: Diagnosis present

## 2021-08-13 DIAGNOSIS — L8990 Pressure ulcer of unspecified site, unspecified stage: Secondary | ICD-10-CM | POA: Diagnosis present

## 2021-08-13 LAB — COMPREHENSIVE METABOLIC PANEL
ALT: 13 U/L (ref 0–44)
AST: 11 U/L — ABNORMAL LOW (ref 15–41)
Albumin: 2.5 g/dL — ABNORMAL LOW (ref 3.5–5.0)
Alkaline Phosphatase: 97 U/L (ref 38–126)
Anion gap: 6 (ref 5–15)
BUN: 26 mg/dL — ABNORMAL HIGH (ref 6–20)
CO2: 28 mmol/L (ref 22–32)
Calcium: 8.4 mg/dL — ABNORMAL LOW (ref 8.9–10.3)
Chloride: 106 mmol/L (ref 98–111)
Creatinine, Ser: 0.51 mg/dL (ref 0.44–1.00)
GFR, Estimated: >60 mL/min (ref >60)
Glucose, Bld: 193 mg/dL — ABNORMAL HIGH (ref 70–99)
Potassium: 3.9 mmol/L (ref 3.5–5.1)
Sodium: 140 mmol/L (ref 135–145)
Total Bilirubin: 0.2 mg/dL — ABNORMAL LOW (ref 0.3–1.2)
Total Protein: 5.9 g/dL — ABNORMAL LOW (ref 6.5–8.1)

## 2021-08-13 LAB — CBC WITH DIFFERENTIAL/PLATELET
Abs Immature Granulocytes: 0.04 10*3/uL (ref 0.00–0.07)
Basophils Absolute: 0 10*3/uL (ref 0.0–0.1)
Basophils Relative: 0 %
Eosinophils Absolute: 0 10*3/uL (ref 0.0–0.5)
Eosinophils Relative: 0 %
HCT: 34.4 % — ABNORMAL LOW (ref 36.0–46.0)
Hemoglobin: 11.1 g/dL — ABNORMAL LOW (ref 12.0–15.0)
Immature Granulocytes: 1 %
Lymphocytes Relative: 9 %
Lymphs Abs: 0.7 10*3/uL (ref 0.7–4.0)
MCH: 29.3 pg (ref 26.0–34.0)
MCHC: 32.3 g/dL (ref 30.0–36.0)
MCV: 90.8 fL (ref 80.0–100.0)
Monocytes Absolute: 0.2 10*3/uL (ref 0.1–1.0)
Monocytes Relative: 2 %
Neutro Abs: 6.9 10*3/uL (ref 1.7–7.7)
Neutrophils Relative %: 88 %
Platelets: 213 10*3/uL (ref 150–400)
RBC: 3.79 MIL/uL — ABNORMAL LOW (ref 3.87–5.11)
RDW: 14.4 % (ref 11.5–15.5)
WBC: 7.8 10*3/uL (ref 4.0–10.5)
nRBC: 0 % (ref 0.0–0.2)

## 2021-08-13 LAB — C-REACTIVE PROTEIN: CRP: 13.9 mg/dL — ABNORMAL HIGH (ref <1.0)

## 2021-08-13 LAB — MAGNESIUM: Magnesium: 2.1 mg/dL (ref 1.7–2.4)

## 2021-08-13 MED ORDER — DM-GUAIFENESIN ER 30-600 MG PO TB12
1.0000 | ORAL_TABLET | Freq: Two times a day (BID) | ORAL | Status: DC
  Administered 2021-08-13 – 2021-08-20 (×16): 1 via ORAL
  Filled 2021-08-13 (×16): qty 1

## 2021-08-13 MED ORDER — BENZONATATE 100 MG PO CAPS
100.0000 mg | ORAL_CAPSULE | Freq: Three times a day (TID) | ORAL | Status: DC
Start: 2021-08-13 — End: 2021-08-21
  Administered 2021-08-13 – 2021-08-20 (×24): 100 mg via ORAL
  Filled 2021-08-13 (×24): qty 1

## 2021-08-13 MED ORDER — DOXYCYCLINE HYCLATE 100 MG PO TABS
100.0000 mg | ORAL_TABLET | Freq: Two times a day (BID) | ORAL | Status: DC
  Administered 2021-08-13: 100 mg via ORAL
  Filled 2021-08-13: qty 1

## 2021-08-13 NOTE — Progress Notes (Signed)
Progress Note   Patient: Sandra [name] EQA:[id number] DOB: 29-Jan-1961 DOA: 08/11/2021     Hospitalization day: 2 DOS: the patient was seen and examined on 08/13/2021   Brief hospital course:  Sandra [name] is a 61 y.o. female with medical history significant of CVA, CAD, HTN, depression, HLD, PFO, active smoker. Patient presents with complaints of progressively worsening lethargy and fatigue as well as muscle cramps. Found to have COVID-19 infection with acute hypoxia along with left hip fracture. Orthopedic and hand surgery both consulted, recommend conservative measures with pain control and outpatient follow-up. Hypoxia currently stabilizing.  Assessment and Plan: * Pneumonia due to COVID-19 virus- (present on admission) Acute Hypoxic respiratory failure, POA, 80 % on room air on admission secondary to acute COVID-19 Viral Pneumonia CXR: hazy bilateral peripheral opacities Oxygen requirement: On 3 L of oxygen.  Dropped down to 89% on 2 L. CRP: Improving to 13.9 Remdesivir: Started on 2/22 Steroids: 1 mg/kg IV Solu-Medrol started on 2/22 Baricitinib/Actemra: Currently holding off on initiation until further work-up Antibiotics: Received 1 dose of IV ceftriaxone azithromycin in the ER procalcitonin marginally elevated.  Now on oral doxycycline for cellulitis Isolation duration: First positive test date 08/11/2021, last day of isolation 08/20/2021  Hypokalemia- (present on admission) Corrected.  No more PVC.  Reported ulcerative colitis ([place])- (present on admission) Patient reports that she has a history of ulcerative colitis and was on sulfasalazine but does not take any medication. Has not seen GI in a while. Last colonoscopy was a few years ago. Currently will be on IV steroids which should be helpful for possible flareup of colitis. CT abdomen on 2/8 when she initially presented with diarrhea does not show any evidence of acute inflammation.  Unwitnessed fall- (present on  admission) Left hip greater trochanteric fracture, closed Closed fracture of left middle finger. Currently her left middle finger is splinted. Patient still has some pain. Discussed with orthopedic hand surgery, recommend outpatient follow-up as the middle finger appears to be fractured him almost a month ago. Left hip x-ray shows evidence of greater oblique fracture nondisplaced.  Orthopedic consulted Dr. Lorin Mercy.  Recommend conservative management without any intervention, weightbearing touchdown.  We will continue pain control.  PT OT consult.  Cellulitis of right buttock- (present on admission) Indurated folliculitis like picture on the right buttock area.  No evidence of fluctuation.  Will treat with oral doxycycline.  Acute metabolic encephalopathy- (present on admission) Visual hallucination. Sister at bedside reports that the patient has been hallucinating for the last few days. Mentation improving. Monitor for now.  Pressure ulcer- (present on admission) Stage III sacral medial pressure ulcer, present on admission. Wound care consulted.  Appreciate assistance. Pressure Injury 08/11/21 Sacrum Mid Stage 3 -  Full thickness tissue loss. Subcutaneous fat may be visible but bone, tendon or muscle are NOT exposed. (Active)  08/11/21 2015  Location: Sacrum  Location Orientation: Mid  Staging: Stage 3 -  Full thickness tissue loss. Subcutaneous fat may be visible but bone, tendon or muscle are NOT exposed.  Wound Description (Comments):   Present on Admission: Yes    Morbid obesity ([place])- (present on admission) No current weight in the system. Unsure whether the reported weight is accurate. We will monitor.  H/O: CVA (cerebrovascular accident) On Plavix. CT of the head this admission as well as prior ER visit shows no acute abnormality.  No focal deficit at the time of my evaluation which is new.  Depression- (present on admission) On Zoloft. Continue to hold, resume  once  mentation is back to baseline.  Essential hypertension- (present on admission) Blood pressure rather soft right now. We will hold home medications.  CAD (coronary artery disease)- (present on admission) Hyperlipidemia Continue Plavix, hold Crestor. Follows up with Dr. Einar Gip   Subjective: No nausea no vomiting no fever no chills.  No chest pain abdominal pain.  Physical Exam: Vitals:   08/12/21 0958 08/12/21 2122 08/13/21 0611 08/13/21 1527  BP: 123/76 129/77 130/73 127/64  Pulse: 89 77 75 84  Resp: 18 20 20 17   Temp: 98.5 F (36.9 C) 98.2 F (36.8 C) 98.2 F (36.8 C) 97.6 F (36.4 C)  TempSrc: Oral Oral Oral Oral  SpO2: 95% 95% 91% 91%  Weight:      Height:       General: Appear in mild distress; no visible Abnormal Neck Mass Or lumps, Conjunctiva normal Cardiovascular: S1 and S2 Present, no Murmur, Respiratory: good respiratory effort, Bilateral Air entry present and CTA, no Crackles, no wheezes Abdomen: Bowel Sound present Extremities: no Pedal edema Neurology: alert and oriented to time, place, and person Gait not checked due to patient safety concerns   Data Reviewed:  I have Reviewed nursing notes, Vitals, and Lab results since pt's last encounter. Pertinent lab results CBC, CMP, CRP I have ordered test including CBC, CMP, CRP I have discussed pt's care plan and test results with wound care RN.   Family Communication: Discussed with sister.  Disposition: Status is: Inpatient Remains inpatient appropriate because: Still hypoxic requiring IV therapy with steroids.  Author: Berle Mull, MD 08/13/2021 6:18 PM  For on call review [email or web address].

## 2021-08-13 NOTE — Evaluation (Signed)
Physical Therapy Evaluation Patient Details Name: Sandra [name] MRN: [id number] DOB: 04/16/1961 Today's Date: 08/13/2021  History of Present Illness  61 yo female [facility] on [facility] with fall x3 weeks ago with progressive generalized weakness, fatigue, AMS. CXR shows L>R PNA, covid + requiring supplemental O2. xray shows L greater trochanter fx, nondisplaced. Pt with recent ED admission x2 weeks ago with colitis and L middle finger fx now splinted, d/c home after this. PMH includes R MCA CVA 2006, anxiety, OA, depression, HTN, HLD, CAD with MI, PFO, pre DM, tobacco abuse.   Clinical Impression  Pt presents with generalized weakness, L hip pain, chronic L-sided weakness secondary to remote CVA, impaired knowledge and application of TDWB LLE precautions, max difficulty performing mobility tasks, and decreased activity tolerance vs baseline. Pt to benefit from acute PT to address deficits. Pt requiring mod-max +2 assist for transfer-level mobility at this time, is very limited by pain, weakness, and fear of falling. Pt will require ST-SNF to return to PLOF, has limited social support. PT to progress mobility as tolerated, and will continue to follow acutely.         Recommendations for follow up therapy are one component of a multi-disciplinary discharge planning process, led by the attending physician.  Recommendations may be updated based on patient status, additional functional criteria and insurance authorization.  Follow Up Recommendations Skilled nursing-short term rehab (<3 hours/day)    Assistance Recommended at Discharge Frequent or constant Supervision/Assistance  Patient can return home with the following  Two people to help with walking and/or transfers;A lot of help with bathing/dressing/bathroom;Assistance with cooking/housework;Direct supervision/assist for medications management;Direct supervision/assist for financial management;Assist for transportation    Equipment  Recommendations None recommended by PT  Recommendations for Other Services       Functional Status Assessment Patient has had a recent decline in their functional status and demonstrates the ability to make significant improvements in function in a reasonable and predictable amount of time.     Precautions / Restrictions Precautions Precautions: Fall Precaution Comments: 3LO2 via [place] Restrictions Weight Bearing Restrictions: Yes LLE Weight Bearing: Touchdown weight bearing      Mobility  Bed Mobility Overal bed mobility: Needs Assistance Bed Mobility: Supine to Sit     Supine to sit: Mod assist, +2 for physical assistance, Min assist     General bed mobility comments: assist for trunk elevation and progression LEs to EOB. Increased time and effort, use of bedrails and multiple scoots to reach EOB.    Transfers Overall transfer level: Needs assistance Equipment used: Rolling walker (2 wheels) Transfers: Sit to/from Stand, Bed to chair/wheelchair/BSC Sit to Stand: Mod assist, +2 physical assistance Stand pivot transfers: Max assist, +2 physical assistance         General transfer comment: mod-max +2 for power up, rise, steadying, and pivot to/from [facility]. Pt cannot step while maintaining TDWB on LLE, encouraged pivot on RLE while maintaining more NWB LLE. Stand and pivot x2.    Ambulation/Gait                  Stairs            Wheelchair Mobility    Modified Rankin (Stroke Patients Only)       Balance Overall balance assessment: Needs assistance, History of Falls Sitting-balance support: No upper extremity supported, Feet supported Sitting balance-Leahy Scale: Fair     Standing balance support: Bilateral upper extremity supported, During functional activity, Reliant on assistive device for balance Standing  balance-Leahy Scale: Zero Standing balance comment: max +2                             Pertinent Vitals/Pain Pain  Assessment Pain Assessment: Faces Faces Pain Scale: Hurts little more Pain Location: Left hip Pain Descriptors / Indicators: Grimacing, Guarding, Sore, Throbbing Pain Intervention(s): Limited activity within patient's tolerance, Monitored during session, Repositioned    Home Living Family/patient expects to be discharged to:: Skilled nursing facility Living Arrangements: Alone                 Additional Comments: Lives in an apartment with her two dogs. Sister has been helping her last couple of weeks.    Prior Function Prior Level of Function : Independent/Modified Independent;History of Falls (last six months)             Mobility Comments: Has a walker and a wheelchair - uses them both. Reports falling a lot at home. ADLs Comments: modified independent. Has assistsance with getting groceries.     Hand Dominance   Dominant Hand: Right    Extremity/Trunk Assessment   Upper Extremity Assessment Upper Extremity Assessment: Defer to OT evaluation    Lower Extremity Assessment Lower Extremity Assessment: Generalized weakness;LLE deficits/detail LLE Deficits / Details: suspect chronic L weakness from CVA, lacks terminal knee extension and presents with incoordination LLE in standing. difficult to differentiate fx deficits from chronic cva deficits. + sustained clonus LLE. LLE Coordination: decreased gross motor;decreased fine motor    Cervical / Trunk Assessment Cervical / Trunk Assessment: Normal  Communication   Communication: No difficulties  Cognition Arousal/Alertness: Awake/alert Behavior During Therapy: Anxious Overall Cognitive Status: Impaired/Different from baseline Area of Impairment: Safety/judgement, Problem solving, Following commands                       Following Commands: Follows one step commands consistently Safety/Judgement: Decreased awareness of deficits   Problem Solving: Difficulty sequencing, Requires verbal cues, Requires  tactile cues, Slow processing General Comments: pt tearful intermittently throughout session, is worried about her dogs at home. Pt lacks some insight into deficits, initially stating she needs to d/c home but by end of session pt understands current mobility deficits and pain presentation. Fearful of falling throughout session.        General Comments General comments (skin integrity, edema, etc.): multiple bouts of coughing during session, unable to get SpO2 reading with several attempts. 3LO2 via [place]    Exercises     Assessment/Plan    PT Assessment Patient needs continued PT services  PT Problem List Decreased strength;Decreased mobility;Decreased balance;Decreased knowledge of use of DME;Pain;Decreased safety awareness;Decreased range of motion;Decreased knowledge of precautions;Decreased cognition;Decreased activity tolerance       PT Treatment Interventions DME instruction;Therapeutic activities;Gait training;Therapeutic exercise;Patient/family education;Balance training;Stair training;Functional mobility training;Neuromuscular re-education    PT Goals (Current goals can be found in the Care Plan section)  Acute Rehab PT Goals Patient Stated Goal: get home to my dogs PT Goal Formulation: With patient Time For Goal Achievement: 08/27/21 Potential to Achieve Goals: Fair    Frequency Min 2X/week     Co-evaluation PT/OT/SLP Co-Evaluation/Treatment: Yes Reason for Co-Treatment: For patient/therapist safety;To address functional/ADL transfers PT goals addressed during session: Mobility/safety with mobility;Balance;Strengthening/ROM         AM-PAC PT "6 Clicks" Mobility  Outcome Measure Help needed turning from your back to your side while in a flat bed without using bedrails?: A  Lot Help needed moving from lying on your back to sitting on the side of a flat bed without using bedrails?: A Lot Help needed moving to and from a bed to a chair (including a wheelchair)?: A  Lot Help needed standing up from a chair using your arms (e.g., wheelchair or bedside chair)?: A Lot Help needed to walk in hospital room?: Total Help needed climbing 3-5 steps with a railing? : Total 6 Click Score: 10    End of Session Equipment Utilized During Treatment: Gait belt Activity Tolerance: Patient limited by fatigue Patient left: in chair;with call bell/phone within reach;with chair alarm set Nurse Communication: Mobility status;Other (comment) (pt soiled in urine, PT/OT assisted pt with washup and will need linen change) PT Visit Diagnosis: Other abnormalities of gait and mobility (R26.89);Muscle weakness (generalized) (M62.81)    Time: 9977-4142 PT Time Calculation (min) (ACUTE ONLY): 35 min   Charges:   PT Evaluation $PT Eval Moderate Complexity: 1 Mod         Estefano Victory S, PT DPT Acute Rehabilitation Services Pager [phone]  Office [phone]   Louis Matte 08/13/2021, 12:49 PM

## 2021-08-13 NOTE — Evaluation (Signed)
Clinical/Bedside Swallow Evaluation Patient Details  Name: Sandra [name] MRN: [id number] Date of Birth: 04-01-1961  Today's Date: 08/13/2021 Time: SLP Start Time (ACUTE ONLY): 1229 SLP Stop Time (ACUTE ONLY): 1300 SLP Time Calculation (min) (ACUTE ONLY): 31 min  Past Medical History:  Past Medical History:  Diagnosis Date   Acute right MCA stroke ([place]) 2006   Acute R MCA stroked  with L arm and leg weakness   Anxiety 1980s   On Klonipin since age 89. Had addiction problem with Xanax which  Was therefore d/c . Seen in the past at [facility].    Arthritis 04/16/2011   Blind right eye    CAD (coronary artery disease) 2007   istory of MI with stent in 2007 by Dr. Chancy Milroy, [facility]. Stent placement by Dr Cleatis Polka    Chronic pain    Depression 03/07/2011   Fracture of fifth toe, left, closed 01/28/2011   History of left fifth toe proximal phalanx fracture when patient had a stroke (01/2011) and fell. Seen by Dr Doran Durand.     Fractured toe 01/2011   History of left fifth toe proximal phalanx fracture when patient had a stroke (01/2011) and fell. Seen by Dr Doran Durand.    Hyperlipidemia 03/07/2011   Hypertension 03/07/2011   Incontinence 04/14/2011   Myocardial infarct, old 2008   stents   PFO (patent foramen ovale) August 2012   PFO seen on TEE during hospitalization in 01/2011.  Patient to f/u with Dr. Leonie Man, neurology, for enrollment in trial for medical treatment of PFO   Pneumonia    Prediabetes 03/07/2011   Psoriasis 03/07/2011   Substance abuse ([place])    h/o narcotic abuse pt denies as of 01/09/12   Tobacco abuse 03/07/2011   Quit 01/2011    Past Surgical History:  Past Surgical History:  Procedure Laterality Date   BIOPSY THYROID     CARDIAC CATHETERIZATION     cardiac stent     2008   CHOLECYSTECTOMY N/A 07/28/2017   Procedure: LAPAROSCOPIC CHOLECYSTECTOMY WITH INTRAOPERATIVE CHOLANGIOGRAM;  Surgeon: Jovita Kussmaul, MD;  Location: [place];  Service: General;  Laterality: N/A;    EYE SURGERY     IR GENERIC HISTORICAL  03/16/2016   IR RADIOLOGIST EVAL & MGMT 03/16/2016 Marybelle Killings, MD [facility] INTERV RAD   HPI:  Pt adm to [facility] with hip fx after fall - f/u with ortho as op - no surgery now d/t covid, twb left severe resp fx COVID 19 +, stroke hx, diarrhea prior to admit for 3 weeks, walks with walker, CVA, CAD, HTN, depression, HLD, PFO, ulcerative colitis, active smoker.  CXR 2/22 Findings suspicious for left greater than right pneumonia. Brain imaging showed Maxillary sinusitis 2. Large RIGHT MCA territory remote infarction 01/2011 = right parietal, temporal, corona radiata, silent penetration of thin with mixed on mbs, soft/thin advised 01/2011.    Assessment / Plan / Recommendation  Clinical Impression  Pt with ongoing cranial nerve deficits including glossopharyngeal, trigeminal and facial deficits.  SLP gave her dentures to use to consume her meal as she uses them to eat.  H/O dysphagia and silent aspiration dating back to 2012 after acute CVA with silent aspiration of thin when consuming mixed consistencies only.  She today is observed with lunch meal - no indication of aspiration with water, tea, macaroni and cheese.  Oral pocketing on left observed which pt cleared with liquid wash. She did not pass 3ounce Yale due to needing rest and having subtle coughing within  3 seconds after.  Pt denies having pnas since her CVA in 2012 and does not want MBS completed.  SlP did not observe any indication of severe dysphagia. Recommend continue diet - provided menu to allow her to order foods she can manage.  Will follow up next week to assure tolerance and assess if MBS needed. Using teach back, pt educated to monitor her swallow closely and avoid "washing down food".  Thanks. SLP Visit Diagnosis: Dysphagia, unspecified (R13.10)    Aspiration Risk  Mild aspiration risk    Diet Recommendation Regular;Thin liquid   Liquid Administration via: Cup;Straw Medication Administration: Whole  meds with puree Supervision: Patient able to self feed Compensations: Slow rate;Small sips/bites;Lingual sweep for clearance of pocketing Postural Changes: Seated upright at 90 degrees;Remain upright for at least 30 minutes after po intake    Other  Recommendations Oral Care Recommendations: Oral care BID    Recommendations for follow up therapy are one component of a multi-disciplinary discharge planning process, led by the attending physician.  Recommendations may be updated based on patient status, additional functional criteria and insurance authorization.  Follow up Recommendations No SLP follow up      Assistance Recommended at Discharge Frequent or constant Supervision/Assistance  Functional Status Assessment Patient has had a recent decline in their functional status and demonstrates the ability to make significant improvements in function in a reasonable and predictable amount of time.  Frequency and Duration min 1 x/week  1 week       Prognosis Prognosis for Safe Diet Advancement: Good      Swallow Study   General Date of Onset: 08/13/21 HPI: Pt adm to [facility] with hip fx after fall - f/u with ortho as op - no surgery now d/t covid, twb left severe resp fx COVID 19 +, stroke hx, diarrhea prior to admit for 3 weeks, walks with walker, CVA, CAD, HTN, depression, HLD, PFO, ulcerative colitis, active smoker.  CXR 2/22 Findings suspicious for left greater than right pneumonia. Brain imaging showed Maxillary sinusitis 2. Large RIGHT MCA territory remote infarction 01/2011 = right parietal, temporal, corona radiata, silent penetration of thin with mixed on mbs, soft/thin advised 01/2011. Type of Study: Bedside Swallow Evaluation Previous Swallow Assessment: see HPI Diet Prior to this Study: Thin liquids;Regular Temperature Spikes Noted: No Respiratory Status: Nasal cannula History of Recent Intubation: No Behavior/Cognition: Alert;Cooperative;Pleasant mood Oral Cavity Assessment:  Within Functional Limits Oral Care Completed by SLP: No Oral Cavity - Dentition: Dentures, top;Dentures, bottom Vision: Functional for self-feeding Self-Feeding Abilities: Able to feed self Patient Positioning: Upright in chair Baseline Vocal Quality: Low vocal intensity Volitional Cough: Strong Volitional Swallow: Able to elicit    Oral/Motor/Sensory Function Overall Oral Motor/Sensory Function: Moderate impairment Facial ROM: Reduced left;Suspected CN VII (facial) dysfunction Facial Symmetry: Abnormal symmetry left;Suspected CN VII (facial) dysfunction Facial Strength: Suspected CN VII (facial) dysfunction;Reduced left Facial Sensation: Reduced left;Suspected CN V (Trigeminal) dysfunction Lingual Symmetry: Within Functional Limits Lingual Strength: Reduced Velum: Within Functional Limits   Ice Chips Ice chips: Not tested   Thin Liquid Thin Liquid: Impaired Pharyngeal  Phase Impairments: Cough - Delayed Other Comments: Pt did not pass 3 ounce Yale due to requiring rest break and coughing within 2 seconds of finishing liquids; Small single sips tolerated well without indication of aspiration.    Nectar Thick Nectar Thick Liquid: Not tested   Honey Thick Honey Thick Liquid: Not tested   Puree Puree: Not tested   Solid     Solid: Impaired Presentation:  Self Fed;Spoon Oral Phase Impairments: Impaired mastication Oral Phase Functional Implications: Left lateral sulci pocketing      Macario Golds 08/13/2021,1:16 PM   Kathleen Lime, MS [facility] SLP Acute Rehab Services Office [phone] Pager [phone]

## 2021-08-13 NOTE — TOC Progression Note (Signed)
Transition of Care [facility]) - Progression Note    Patient Details  Name: Sandra [name] MRN: [id number] Date of Birth: 12-20-1960  Transition of Care [facility]) CM/SW Contact  Leeroy Cha, RN Phone Number: 08/13/2021, 10:20 AM  Clinical Narrative:    Call from Isla Pence at [phone] APS.  Patient was found at home sitting in feces unable to get up or to care for herself.  Sister has been trying to help but is undergoing Chemotherapy at this time and can not.  Will get the  md to order pt evaluation to see what level of care patient is at.        Expected Discharge Plan and Services                                                 Social Determinants of Health (SDOH) Interventions    Readmission Risk Interventions No flowsheet data found.

## 2021-08-13 NOTE — Evaluation (Signed)
Occupational Therapy Evaluation Patient Details Name: Sandra [name] MRN: [id number] DOB: 01-13-1961 Today's Date: 08/13/2021   History of Present Illness 61 yo female presents [facility] on [facility]/22 with fall x3 weeks ago with progressive generalized weakness, fatigue, AMS. CXR shows L>R PNA, covid + requiring supplemental O2. xray shows L greater trochanter fx, nondisplaced. Pt with recent ED admission x2 weeks ago with colitis and L middle finger fx now splinted, d/c home after this. PMH includes R MCA CVA 2006, anxiety, OA, depression, HTN, HLD, CAD with MI, PFO, pre DM, tobacco abuse.   Clinical Impression   Sandra [name] is a 61 year old woman who presents with pain, left sided hemiparesis, touch down weight bearing status, decreased ROM and strength in LLE compared to normal, generalized weakness, poor activity tolerance, and poor balance. Prior to several weeks ago patient modified independent for ADLs at home with use of walker and wheelchair as needed and with frequent falls. For the last two weeks she has been weaker than normal and having assistance of sister to help her. Today she requires +2 assistance for transfers, LB ADLs, and any sit to stands. She is limited by pain, Wb status, difficulty controlling left lower extremity, and inability to use left hand effectively due to decreased wrist ROM, middle finger splint, and neurological deficits. Patient will benefit from skilled OT services while in hospital to improve deficits and learn compensatory strategies as needed in order to return to PLOF.  Recommend short term rehab. Patient at times is emotional due to decline in functional status and worries about losing her dogs and family illnesses. Initially she is not agreeable to rehab but after requiring +2 assistance for transfers and her significant fear of falling she agrees that short term rehab is the best option.      Recommendations for follow up therapy are one component of a  multi-disciplinary discharge planning process, led by the attending physician.  Recommendations may be updated based on patient status, additional functional criteria and insurance authorization.   Follow Up Recommendations  Skilled nursing-short term rehab (<3 hours/day)    Assistance Recommended at Discharge Frequent or constant Supervision/Assistance  Patient can return home with the following Two people to help with walking and/or transfers;Two people to help with bathing/dressing/bathroom    Functional Status Assessment  Patient has had a recent decline in their functional status and demonstrates the ability to make significant improvements in function in a reasonable and predictable amount of time.  Equipment Recommendations  None recommended by OT    Recommendations for Other Services       Precautions / Restrictions Precautions Precautions: Fall Precaution Comments: 3LO2 via , Left hemiparesis, left middle finger splint and wrist contracture Restrictions Weight Bearing Restrictions: Yes LLE Weight Bearing: Touchdown weight bearing      Mobility Bed Mobility                    Transfers                          Balance Overall balance assessment: Needs assistance Sitting-balance support: No upper extremity supported Sitting balance-Leahy Scale: Good     Standing balance support: During functional activity Standing balance-Leahy Scale: Poor Standing balance comment: max +2                           ADL either performed or assessed with clinical judgement  ADL Overall ADL's : Needs assistance/impaired Eating/Feeding: Set up;Sitting   Grooming: Set up;Sitting   Upper Body Bathing: Minimal assistance;Sitting   Lower Body Bathing: Maximal assistance;Sitting/lateral leans   Upper Body Dressing : Maximal assistance;Sitting   Lower Body Dressing: Total assistance;+2 for physical assistance;Sit to/from stand Lower Body Dressing  Details (indicate cue type and reason): +2 assistance needed to manage clothing and patient for sit to stand dresing Toilet Transfer: +2 for physical assistance;+2 for safety/equipment;Maximal assistance Toilet Transfer Details (indicate cue type and reason): max assist with +2 physical assistance to stand pivot. patient limited by hemiparesis of left lower extremity with impaired motoro control Toileting- Clothing Manipulation and Hygiene: Total assistance;+2 for physical assistance Toileting - Clothing Manipulation Details (indicate cue type and reason): +2 assistance for standing and toileting task     Functional mobility during ADLs: Maximal assistance;+2 for physical assistance General ADL Comments: +2 to pivot for transfers and mod assist for transfer into sitting .     Vision Patient Visual Report: No change from baseline Additional Comments: Blind in the right eye     Perception     Praxis      Pertinent Vitals/Pain Pain Assessment Pain Assessment: Faces Faces Pain Scale: Hurts little more Pain Location: Left hip Pain Descriptors / Indicators: Grimacing, Guarding, Sore, Throbbing Pain Intervention(s): RN gave pain meds during session, Limited activity within patient's tolerance     Hand Dominance Right   Extremity/Trunk Assessment Upper Extremity Assessment Upper Extremity Assessment: RUE deficits/detail;LUE deficits/detail RUE Deficits / Details: WFL ROM, grossly 4/5 strength throughout RUE Sensation: WNL RUE Coordination: WNL LUE Deficits / Details: WFL ROM shoulder and elbow ROM and 4-/5 strength, partial supination of forearm, wrist contracted in 20-30 dgrees of flexion, middle finger splinted, poor grasp and functional use of left hand LUE Coordination: decreased fine motor;decreased gross motor   Lower Extremity Assessment Lower Extremity Assessment: Defer to PT evaluation LLE Deficits / Details: suspect chronic L weakness from CVA, lacks terminal knee extension  and presents with incoordination LLE in standing. difficult to differentiate fx deficits from chronic cva deficits. + sustained clonus LLE. LLE Coordination: decreased gross motor;decreased fine motor   Cervical / Trunk Assessment Cervical / Trunk Assessment: Normal   Communication Communication Communication: No difficulties   Cognition Arousal/Alertness: Awake/alert Behavior During Therapy: Anxious Overall Cognitive Status: Within Functional Limits for tasks assessed                                 General Comments: pt tearful intermittently throughout session, is worried about her dogs at home. Pt lacks some insight into deficits, initially stating she needs to d/c home but by end of session pt understands current mobility deficits and pain presentation. Fearful of falling throughout session.     General Comments  multiple bouts of coughing during session, unable to get SpO2 reading with several attempts. 3LO2 via [place]    Exercises     Shoulder Instructions      Home Living Family/patient expects to be discharged to:: Skilled nursing facility Living Arrangements: Alone                               Additional Comments: Lives in an apartment with her two dogs. Sister has been helping her last couple of weeks.      Prior Functioning/Environment Prior Level of Function : Independent/Modified Independent;History of  Falls (last six months)             Mobility Comments: Has a walker and a wheelchair - uses them both. Reports falling a lot at home. ADLs Comments: modified independent. Has assistsance with getting groceries.        OT Problem List: Decreased strength;Decreased range of motion;Decreased activity tolerance;Impaired balance (sitting and/or standing);Decreased safety awareness;Decreased coordination;Impaired vision/perception;Decreased knowledge of use of DME or AE;Decreased knowledge of precautions;Pain;Impaired UE functional  use;Impaired tone      OT Treatment/Interventions: Self-care/ADL training;Therapeutic exercise;DME and/or AE instruction;Therapeutic activities;Balance training;Patient/family education    OT Goals(Current goals can be found in the care plan section) Acute Rehab OT Goals Patient Stated Goal: to return to baseline OT Goal Formulation: With patient Time For Goal Achievement: 08/27/21 Potential to Achieve Goals: Good  OT Frequency: Min 2X/week    Co-evaluation   Reason for Co-Treatment: For patient/therapist safety;To address functional/ADL transfers PT goals addressed during session: Mobility/safety with mobility;Balance;Strengthening/ROM        AM-PAC OT "6 Clicks" Daily Activity     Outcome Measure Help from another person eating meals?: A Little Help from another person taking care of personal grooming?: A Little Help from another person toileting, which includes using toliet, bedpan, or urinal?: Total Help from another person bathing (including washing, rinsing, drying)?: A Lot Help from another person to put on and taking off regular upper body clothing?: A Lot Help from another person to put on and taking off regular lower body clothing?: Total 6 Click Score: 12   End of Session Equipment Utilized During Treatment: Gait belt;Rolling walker (2 wheels) Nurse Communication: Mobility status  Activity Tolerance: Patient limited by pain Patient left: in chair;with call bell/phone within reach;with chair alarm set  OT Visit Diagnosis: Other abnormalities of gait and mobility (R26.89);Pain;Hemiplegia and hemiparesis Hemiplegia - Right/Left: Left Hemiplegia - dominant/non-dominant: Non-Dominant Hemiplegia - caused by: Cerebral infarction Pain - Right/Left: Left Pain - part of body: Hip                Time: 1140-1230 OT Time Calculation (min): 50 min Charges:  OT General Charges $OT Visit: 1 Visit OT Evaluation $OT Eval Moderate Complexity: 1 Mod OT Treatments $Self  Care/Home Management : 8-22 mins  Jerre Vandrunen, OTR/L Acute Care Rehab Services  Office [phone] Pager: [place] 08/13/2021, 1:23 PM

## 2021-08-13 NOTE — Consult Note (Addendum)
[facility] Nurse Consult Note: Reason for Consult: sacrum and abrasions.   Patient from home with AMS, unwitnessed falls, lethargy, hallucinations Found at home covered in urine and stool.  Wound type: Stage 2 Pressure injury; sacrum;4cm x 3.5cm x 0.1cm  Acute process right ischium: small black center with 3cm of erythema and induration  Pressure Injury POA: Yes Measurement: see above  Wound HDT:PNSQZY; 100% yellow with epithelial buds  Drainage (amount, consistency, odor) scant, no odor Periwound: intact  Dressing procedure/placement/frequency: Silicone foam to the sacrum; offload Notified hospitalist of acute process right ischium     Re consult if needed, will not follow at this time. Thanks  Jerran Tappan [employer], RN,CWOCN, CNS, [place] [phone])

## 2021-08-13 NOTE — Assessment & Plan Note (Addendum)
Indurated folliculitis like picture on the right buttock area. No evidence of fluctuation.   Will treat with oral doxycycline.

## 2021-08-13 NOTE — Assessment & Plan Note (Signed)
Stage III sacral medial pressure ulcer, present on admission. Wound care consulted.  Appreciate assistance. Pressure Injury 08/11/21 Sacrum Mid Stage 3 -  Full thickness tissue loss. Subcutaneous fat may be visible but bone, tendon or muscle are NOT exposed. (Active)  08/11/21 2015  Location: Sacrum  Location Orientation: Mid  Staging: Stage 3 -  Full thickness tissue loss. Subcutaneous fat may be visible but bone, tendon or muscle are NOT exposed.  Wound Description (Comments):   Present on Admission: Yes

## 2021-08-14 LAB — CBC WITH DIFFERENTIAL/PLATELET
Abs Immature Granulocytes: 0.09 K/uL — ABNORMAL HIGH (ref 0.00–0.07)
Basophils Absolute: 0 K/uL (ref 0.0–0.1)
Basophils Relative: 0 %
Eosinophils Absolute: 0 K/uL (ref 0.0–0.5)
Eosinophils Relative: 0 %
HCT: 34.7 % — ABNORMAL LOW (ref 36.0–46.0)
Hemoglobin: 11.1 g/dL — ABNORMAL LOW (ref 12.0–15.0)
Immature Granulocytes: 1 %
Lymphocytes Relative: 9 %
Lymphs Abs: 0.6 K/uL — ABNORMAL LOW (ref 0.7–4.0)
MCH: 29.3 pg (ref 26.0–34.0)
MCHC: 32 g/dL (ref 30.0–36.0)
MCV: 91.6 fL (ref 80.0–100.0)
Monocytes Absolute: 0.2 K/uL (ref 0.1–1.0)
Monocytes Relative: 3 %
Neutro Abs: 6.3 K/uL (ref 1.7–7.7)
Neutrophils Relative %: 87 %
Platelets: 211 K/uL (ref 150–400)
RBC: 3.79 MIL/uL — ABNORMAL LOW (ref 3.87–5.11)
RDW: 14.1 % (ref 11.5–15.5)
WBC: 7.2 K/uL (ref 4.0–10.5)
nRBC: 0 % (ref 0.0–0.2)

## 2021-08-14 LAB — COMPREHENSIVE METABOLIC PANEL
ALT: 12 U/L (ref 0–44)
AST: 10 U/L — ABNORMAL LOW (ref 15–41)
Albumin: 2.4 g/dL — ABNORMAL LOW (ref 3.5–5.0)
Alkaline Phosphatase: 89 U/L (ref 38–126)
Anion gap: 8 (ref 5–15)
BUN: 26 mg/dL — ABNORMAL HIGH (ref 6–20)
CO2: 25 mmol/L (ref 22–32)
Calcium: 8.1 mg/dL — ABNORMAL LOW (ref 8.9–10.3)
Chloride: 100 mmol/L (ref 98–111)
Creatinine, Ser: 0.52 mg/dL (ref 0.44–1.00)
GFR, Estimated: >60 mL/min (ref >60)
Glucose, Bld: 216 mg/dL — ABNORMAL HIGH (ref 70–99)
Potassium: 4.1 mmol/L (ref 3.5–5.1)
Sodium: 133 mmol/L — ABNORMAL LOW (ref 135–145)
Total Bilirubin: 0.3 mg/dL (ref 0.3–1.2)
Total Protein: 5.6 g/dL — ABNORMAL LOW (ref 6.5–8.1)

## 2021-08-14 LAB — MAGNESIUM: Magnesium: 2.1 mg/dL (ref 1.7–2.4)

## 2021-08-14 LAB — C-REACTIVE PROTEIN: CRP: 5.5 mg/dL — ABNORMAL HIGH

## 2021-08-14 MED ORDER — PREDNISONE 50 MG PO TABS
50.0000 mg | ORAL_TABLET | Freq: Every day | ORAL | Status: DC
  Administered 2021-08-15 – 2021-08-20 (×6): 50 mg via ORAL
  Filled 2021-08-14 (×6): qty 1

## 2021-08-14 MED ORDER — METHYLPREDNISOLONE SODIUM SUCC 125 MG IJ SOLR
80.0000 mg | Freq: Every day | INTRAMUSCULAR | Status: AC
  Administered 2021-08-14: 80 mg via INTRAVENOUS
  Filled 2021-08-14: qty 2

## 2021-08-14 MED ORDER — DOXYCYCLINE HYCLATE 100 MG PO TABS
100.0000 mg | ORAL_TABLET | Freq: Two times a day (BID) | ORAL | Status: DC
  Administered 2021-08-14 – 2021-08-18 (×9): 100 mg via ORAL
  Filled 2021-08-14 (×9): qty 1

## 2021-08-14 NOTE — Plan of Care (Signed)
°  Problem: Respiratory: Goal: Will maintain a patent airway Outcome: Progressing   Problem: Clinical Measurements: Goal: Ability to maintain clinical measurements within normal limits will improve Outcome: Progressing Goal: Diagnostic test results will improve Outcome: Progressing Goal: Respiratory complications will improve Outcome: Progressing   Problem: Safety: Goal: Ability to remain free from injury will improve Outcome: Progressing

## 2021-08-14 NOTE — Progress Notes (Signed)
Progress Note   Patient: Sandra [name] [id number]:[id number] DOB: [birth year] DOA: 08/11/2021     Hospitalization day: 3 DOS: the patient was seen and examined on 08/14/2021   Brief hospital course:  Zadaya Cuadra is a 61 y.o. female with medical history significant of CVA, CAD, HTN, depression, HLD, PFO, active smoker. Patient presents with complaints of progressively worsening lethargy and fatigue as well as muscle cramps. Found to have COVID-19 infection with acute hypoxia along with left hip fracture. Orthopedic and hand surgery both consulted, recommend conservative measures with pain control and outpatient follow-up. Hypoxia currently stabilizing.  Assessment and Plan: * Pneumonia due to COVID-19 virus- (present on admission) Acute Hypoxic respiratory failure, POA, 80 % on room air on admission secondary to acute COVID-19 Viral Pneumonia CXR: hazy bilateral peripheral opacities Oxygen requirement: On 3 L of oxygen.  Dropped down to 89% on 2 L. CRP: Improving Remdesivir: Started on 2/22 Steroids: 1 mg/kg IV Solu-Medrol started on 2/22, switch to oral prednisone 2/25. Baricitinib/Actemra: Currently holding off Antibiotics: Received 1 dose of IV ceftriaxone azithromycin in the ER procalcitonin marginally elevated.  Now on oral doxycycline for cellulitis Isolation duration: First positive test date 08/11/2021, last day of isolation 08/20/2021  Hypokalemia- (present on admission) Corrected.  No more PVC.  Reported ulcerative colitis ([place])- (present on admission) Patient reports that she has a history of ulcerative colitis and was on sulfasalazine but does not take any medication. Has not seen GI in a while. Last colonoscopy was a few years ago. Currently will be on IV steroids which should be helpful for possible flareup of colitis. CT abdomen on 2/8 when she initially presented with diarrhea does not show any evidence of acute inflammation.  Unwitnessed fall- (present on admission) Left hip  greater trochanteric fracture, closed Closed fracture of left middle finger. Currently her left middle finger is splinted. Patient still has some pain. Discussed with orthopedic hand surgery, recommend outpatient follow-up as the middle finger appears to be fractured him almost a month ago. Left hip x-ray shows evidence of greater oblique fracture nondisplaced.  Orthopedic consulted Dr. Lorin Mercy.  Recommend conservative management without any intervention, weightbearing touchdown.  We will continue pain control.  PT OT consult.  Cellulitis of right buttock- (present on admission) Indurated folliculitis like picture on the right buttock area.  No evidence of fluctuation.  Will treat with oral doxycycline.  Acute metabolic encephalopathy- (present on admission) Visual hallucination. Sister at bedside reports that the patient has been hallucinating for the last few days. Mentation improving. Monitor for now.  Pressure ulcer- (present on admission) Stage III sacral medial pressure ulcer, present on admission. Wound care consulted.  Appreciate assistance. Pressure Injury 08/11/21 Sacrum Mid Stage 3 -  Full thickness tissue loss. Subcutaneous fat may be visible but bone, tendon or muscle are NOT exposed. (Active)  08/11/21 2015  Location: Sacrum  Location Orientation: Mid  Staging: Stage 3 -  Full thickness tissue loss. Subcutaneous fat may be visible but bone, tendon or muscle are NOT exposed.  Wound Description (Comments):   Present on Admission: Yes    Morbid obesity ([place])- (present on admission) Body mass index is 28.62 kg/m.  BMI improved significantly.  No morbid obesity right now.  H/O: CVA (cerebrovascular accident) On Plavix. CT of the head this admission as well as prior ER visit shows no acute abnormality.  No focal deficit at the time of my evaluation which is new.  Depression- (present on admission) On Zoloft.  Resume tomorrow.  Essential hypertension- (  present on  admission) Blood pressure rather soft right now. We will hold home medications.  CAD (coronary artery disease)- (present on admission) Hyperlipidemia Continue Plavix, hold Crestor. Follows up with Dr. Einar Gip  Subjective: Feeling better.  No diarrhea.  No nausea no vomiting no fever no chills.  No chest pain abdominal pain.  Physical Exam: Vitals:   08/13/21 1527 08/13/21 2146 08/14/21 0632 08/14/21 1858  BP: 127/64 139/76 130/74 130/80  Pulse: 84 (!) 104 82 79  Resp: 17 16 16 18   Temp: 97.6 F (36.4 C) 98.2 F (36.8 C) 98.6 F (37 C) 98.1 F (36.7 C)  TempSrc: Oral Oral Oral Oral  SpO2: 91% 92% 91% 93%  Weight:      Height:       General: Appear in mild distress; no visible Abnormal Neck Mass Or lumps, Conjunctiva normal Cardiovascular: S1 and S2 Present, no Murmur, Respiratory: good respiratory effort, Bilateral Air entry present and CTA, no Crackles, no wheezes Abdomen: Bowel Sound present Extremities: no Pedal edema  Data Reviewed:  I have Reviewed nursing notes, Vitals, and Lab results since pt's last encounter. Pertinent lab results CBC, CMP, CRP I have ordered test including CBC, CMP, CRP    Family Communication: None at bedside  Disposition: Status is: Inpatient Remains inpatient appropriate because: Still hypoxic.  Now awaiting placement at SNF.  Not safe to go home secondary to multiple fractures, multiple fall and living alone. Author: Berle Mull, MD 08/14/2021 8:03 PM  For on call review [email or web address].

## 2021-08-14 NOTE — Progress Notes (Addendum)
Central monitoring reported pt having 10 beats run of Vtach. Reviewed monitor at desk showing artifacts. Pt is asymptomatic vitals are within normal range and charted. Lavina Hamman MD notified no new orders. Continue with plan of care.

## 2021-08-14 NOTE — Progress Notes (Signed)
As reported by Central monitoring station pt had 10 beats of Vtach recorded @ 1834. After reviewing monitor at nursing statin 10 bates of Vtach recorded at 1844 in a different lead. MD notified. Vitals signs within normal range pt asymptomatic no changes from previous assessment.......no signs of discomfort or chest pain. No new orders, will continue with plan of care.

## 2021-08-14 NOTE — Progress Notes (Signed)
Central monitoring informed this RN that patient had a 10 beat run of V tach at [employer].  Patient's RN notified.  Angie Fava, RN

## 2021-08-15 DIAGNOSIS — B370 Candidal stomatitis: Secondary | ICD-10-CM | POA: Diagnosis not present

## 2021-08-15 LAB — COMPREHENSIVE METABOLIC PANEL
ALT: 13 U/L (ref 0–44)
AST: 14 U/L — ABNORMAL LOW (ref 15–41)
Albumin: 2.3 g/dL — ABNORMAL LOW (ref 3.5–5.0)
Alkaline Phosphatase: 87 U/L (ref 38–126)
Anion gap: 7 (ref 5–15)
BUN: 24 mg/dL — ABNORMAL HIGH (ref 6–20)
CO2: 26 mmol/L (ref 22–32)
Calcium: 8.1 mg/dL — ABNORMAL LOW (ref 8.9–10.3)
Chloride: 103 mmol/L (ref 98–111)
Creatinine, Ser: 0.41 mg/dL — ABNORMAL LOW (ref 0.44–1.00)
GFR, Estimated: >60 mL/min (ref >60)
Glucose, Bld: 169 mg/dL — ABNORMAL HIGH (ref 70–99)
Potassium: 3.7 mmol/L (ref 3.5–5.1)
Sodium: 136 mmol/L (ref 135–145)
Total Bilirubin: 0.2 mg/dL — ABNORMAL LOW (ref 0.3–1.2)
Total Protein: 5.4 g/dL — ABNORMAL LOW (ref 6.5–8.1)

## 2021-08-15 LAB — CBC WITH DIFFERENTIAL/PLATELET
Abs Immature Granulocytes: 0.17 10*3/uL — ABNORMAL HIGH (ref 0.00–0.07)
Basophils Absolute: 0 10*3/uL (ref 0.0–0.1)
Basophils Relative: 0 %
Eosinophils Absolute: 0 10*3/uL (ref 0.0–0.5)
Eosinophils Relative: 0 %
HCT: 35.2 % — ABNORMAL LOW (ref 36.0–46.0)
Hemoglobin: 11.4 g/dL — ABNORMAL LOW (ref 12.0–15.0)
Immature Granulocytes: 2 %
Lymphocytes Relative: 13 %
Lymphs Abs: 1.4 10*3/uL (ref 0.7–4.0)
MCH: 29.2 pg (ref 26.0–34.0)
MCHC: 32.4 g/dL (ref 30.0–36.0)
MCV: 90 fL (ref 80.0–100.0)
Monocytes Absolute: 0.8 10*3/uL (ref 0.1–1.0)
Monocytes Relative: 7 %
Neutro Abs: 8.8 10*3/uL — ABNORMAL HIGH (ref 1.7–7.7)
Neutrophils Relative %: 78 %
Platelets: 241 10*3/uL (ref 150–400)
RBC: 3.91 MIL/uL (ref 3.87–5.11)
RDW: 13.5 % (ref 11.5–15.5)
WBC: 11.1 10*3/uL — ABNORMAL HIGH (ref 4.0–10.5)
nRBC: 0 % (ref 0.0–0.2)

## 2021-08-15 LAB — MAGNESIUM: Magnesium: 2 mg/dL (ref 1.7–2.4)

## 2021-08-15 LAB — C-REACTIVE PROTEIN: CRP: 2.3 mg/dL — ABNORMAL HIGH (ref <1.0)

## 2021-08-15 MED ORDER — IPRATROPIUM-ALBUTEROL 20-100 MCG/ACT IN AERS
1.0000 | INHALATION_SPRAY | Freq: Three times a day (TID) | RESPIRATORY_TRACT | Status: DC
  Administered 2021-08-16 – 2021-08-19 (×12): 1 via RESPIRATORY_TRACT
  Filled 2021-08-15 (×2): qty 4

## 2021-08-15 MED ORDER — NYSTATIN 100000 UNIT/ML MT SUSP
5.0000 mL | Freq: Four times a day (QID) | OROMUCOSAL | Status: DC
  Administered 2021-08-15 – 2021-08-20 (×19): 500000 [IU] via ORAL
  Filled 2021-08-15 (×19): qty 5

## 2021-08-15 MED ORDER — POTASSIUM CHLORIDE CRYS ER 20 MEQ PO TBCR
40.0000 meq | EXTENDED_RELEASE_TABLET | Freq: Once | ORAL | Status: AC
  Administered 2021-08-15: 40 meq via ORAL
  Filled 2021-08-15: qty 2

## 2021-08-15 NOTE — Assessment & Plan Note (Signed)
In the setting of steroid use. Will initiate nystatin.

## 2021-08-15 NOTE — Progress Notes (Signed)
Progress Note   Patient: Sandra [name] [id number]:[id number] DOB: 08/04/1960 DOA: 08/11/2021     Hospitalization day: 4 DOS: the patient was seen and examined on 08/15/2021   Brief hospital course:  Sandra [name] is a 61 y.o. female with medical history significant of CVA, CAD, HTN, depression, HLD, PFO, active smoker. Patient presents with complaints of progressively worsening lethargy and fatigue as well as muscle cramps. Found to have COVID-19 infection with acute hypoxia along with left hip fracture. Orthopedic and hand surgery both consulted, recommend conservative measures with pain control and outpatient follow-up. Hypoxia currently stabilizing. 2/26, now medically stable.  Ready for discharge to SNF.  Most likely will require completing isolation in the hospital.  Assessment and Plan: * Pneumonia due to COVID-19 virus- (present on admission) Acute Hypoxic respiratory failure, POA, 80 % on room air on admission secondary to acute COVID-19 Viral Pneumonia CXR: hazy bilateral peripheral opacities Oxygen requirement: On 3 L of oxygen.  Dropped down to 89% on 2 L. CRP: Improving Remdesivir: Started on 2/22 Steroids: 1 mg/kg IV Solu-Medrol started on 2/22, switch to oral prednisone 2/25. Baricitinib/Actemra: Currently holding off Antibiotics: Received 1 dose of IV ceftriaxone azithromycin in the ER procalcitonin marginally elevated.  Now on oral doxycycline for cellulitis Isolation duration: First positive test date 08/11/2021, last day of isolation 08/20/2021  Hypokalemia- (present on admission) Corrected.  No more PVC.  Reported ulcerative colitis ([place])- (present on admission) Patient reports that she has a history of ulcerative colitis and was on sulfasalazine but does not take any medication. Has not seen GI in a while. Last colonoscopy was a few years ago. Currently will be on IV steroids which should be helpful for possible flareup of colitis. CT abdomen on 2/8 when she initially presented  with diarrhea does not show any evidence of acute inflammation. Patient follows up with Dr. Alan Ripper at [facility] for [facility].  Recommend close follow-up.  Patient will require ongoing prednisone therapy for this condition and is seen by GI.  Unwitnessed fall- (present on admission) Left hip greater trochanteric fracture, closed Closed fracture of left middle finger. Currently her left middle finger is splinted. Patient still has some pain. Discussed with orthopedic hand surgery, recommend outpatient follow-up as the middle finger appears to be fractured him almost a month ago. Left hip x-ray shows evidence of greater oblique fracture nondisplaced.  Orthopedic consulted Dr. Lorin Mercy.  Recommend conservative management without any intervention, weightbearing touchdown.  We will continue pain control.  PT OT consult.  Cellulitis of right buttock- (present on admission) Indurated folliculitis like picture on the right buttock area.  No evidence of fluctuation.  Will treat with oral doxycycline.  Acute metabolic encephalopathy- (present on admission) Visual hallucination.  Resolved as of 2/25. Sister reported that the patient has been hallucinating for the last few days prior to admission. Mentation improving.  Oral thrush In the setting of steroid use. Will initiate nystatin.  Pressure ulcer- (present on admission) Stage III sacral medial pressure ulcer, present on admission. Wound care consulted.  Appreciate assistance. Pressure Injury 08/11/21 Sacrum Mid Stage 3 -  Full thickness tissue loss. Subcutaneous fat may be visible but bone, tendon or muscle are NOT exposed. (Active)  08/11/21 2015  Location: Sacrum  Location Orientation: Mid  Staging: Stage 3 -  Full thickness tissue loss. Subcutaneous fat may be visible but bone, tendon or muscle are NOT exposed.  Wound Description (Comments):   Present on Admission: Yes    Morbid obesity ([place])- (present on  admission) Body mass index  is 28.62 kg/m.  BMI improved significantly.  No morbid obesity right now.  H/O: CVA (cerebrovascular accident) On Plavix. CT of the head this admission as well as prior ER visit shows no acute abnormality.  No focal deficit at the time of my evaluation which is new.  Depression- (present on admission) On Zoloft.  Resume tomorrow.  Essential hypertension- (present on admission) Blood pressure normal. Currently does not require any therapy.  CAD (coronary artery disease)- (present on admission) Hyperlipidemia Continue Plavix, hold Crestor. Follows up with Dr. Einar Gip   Subjective: No nausea no vomiting no fever no chills.  Still has some cough but improving.  Physical Exam: Vitals:   08/14/21 1858 08/14/21 2020 08/15/21 0552 08/15/21 1329  BP: 130/80 123/67 123/77 139/65  Pulse: 79 82 87 83  Resp: 18 18  19   Temp: 98.1 F (36.7 C) 98.3 F (36.8 C) 97.9 F (36.6 C) 98.2 F (36.8 C)  TempSrc: Oral Oral Oral Oral  SpO2: 93% 93% 91% 94%  Weight:      Height:       General: Appear in mild distress; no visible Abnormal Neck Mass Or lumps, Conjunctiva normal, oral mucosa shows thrush Cardiovascular: S1 and S2 Present, no Murmur, Respiratory: good respiratory effort, Bilateral Air entry present and CTA, no Crackles, no wheezes Abdomen: Bowel Sound present Extremities: no Pedal edema Neurology: alert and oriented to time, place, and person Gait not checked due to patient safety concerns   Data Reviewed:  I have Reviewed nursing notes, Vitals, and Lab results since pt's last encounter. Pertinent lab results CBC, CMP, CRP I have ordered test including CBC, CMP, CRP    Family Communication: Discussed with sister on phone  Disposition: Status is: Inpatient Remains inpatient appropriate because: Still hypoxic, requires SNF, unsafe to discharge home. Author: Berle Mull, MD 08/15/2021 7:25 PM  For on call review [email or web address].

## 2021-08-16 LAB — CBC WITH DIFFERENTIAL/PLATELET
Abs Immature Granulocytes: 0.21 10*3/uL — ABNORMAL HIGH (ref 0.00–0.07)
Basophils Absolute: 0 10*3/uL (ref 0.0–0.1)
Basophils Relative: 0 %
Eosinophils Absolute: 0 10*3/uL (ref 0.0–0.5)
Eosinophils Relative: 0 %
HCT: 34.5 % — ABNORMAL LOW (ref 36.0–46.0)
Hemoglobin: 11.4 g/dL — ABNORMAL LOW (ref 12.0–15.0)
Immature Granulocytes: 3 %
Lymphocytes Relative: 18 %
Lymphs Abs: 1.4 10*3/uL (ref 0.7–4.0)
MCH: 29.7 pg (ref 26.0–34.0)
MCHC: 33 g/dL (ref 30.0–36.0)
MCV: 89.8 fL (ref 80.0–100.0)
Monocytes Absolute: 0.5 10*3/uL (ref 0.1–1.0)
Monocytes Relative: 7 %
Neutro Abs: 5.6 10*3/uL (ref 1.7–7.7)
Neutrophils Relative %: 72 %
Platelets: 216 10*3/uL (ref 150–400)
RBC: 3.84 MIL/uL — ABNORMAL LOW (ref 3.87–5.11)
RDW: 13.4 % (ref 11.5–15.5)
WBC: 7.7 10*3/uL (ref 4.0–10.5)
nRBC: 0 % (ref 0.0–0.2)

## 2021-08-16 LAB — COMPREHENSIVE METABOLIC PANEL
ALT: 15 U/L (ref 0–44)
AST: 13 U/L — ABNORMAL LOW (ref 15–41)
Albumin: 2.3 g/dL — ABNORMAL LOW (ref 3.5–5.0)
Alkaline Phosphatase: 77 U/L (ref 38–126)
Anion gap: 5 (ref 5–15)
BUN: 21 mg/dL — ABNORMAL HIGH (ref 6–20)
CO2: 25 mmol/L (ref 22–32)
Calcium: 8.2 mg/dL — ABNORMAL LOW (ref 8.9–10.3)
Chloride: 105 mmol/L (ref 98–111)
Creatinine, Ser: 0.45 mg/dL (ref 0.44–1.00)
GFR, Estimated: >60 mL/min (ref >60)
Glucose, Bld: 195 mg/dL — ABNORMAL HIGH (ref 70–99)
Potassium: 4.3 mmol/L (ref 3.5–5.1)
Sodium: 135 mmol/L (ref 135–145)
Total Bilirubin: 0.2 mg/dL — ABNORMAL LOW (ref 0.3–1.2)
Total Protein: 5.2 g/dL — ABNORMAL LOW (ref 6.5–8.1)

## 2021-08-16 LAB — CULTURE, BLOOD (ROUTINE X 2)
Culture: NO GROWTH
Culture: NO GROWTH

## 2021-08-16 LAB — MAGNESIUM: Magnesium: 2.1 mg/dL (ref 1.7–2.4)

## 2021-08-16 LAB — C-REACTIVE PROTEIN: CRP: 1.3 mg/dL — ABNORMAL HIGH (ref <1.0)

## 2021-08-16 NOTE — Progress Notes (Signed)
Progress Note   Patient: Sandra [name] [id number]:[id number] DOB: 1961-01-28 DOA: 08/11/2021     Hospitalization day: 5 DOS: the patient was seen and examined on 08/16/2021   Brief hospital course:  Sandra [name] is a 61 y.o. female with medical history significant of CVA, CAD, HTN, depression, HLD, PFO, active smoker. Patient presents with complaints of progressively worsening lethargy and fatigue as well as muscle cramps. Found to have COVID-19 infection with acute hypoxia along with left hip fracture. Orthopedic and hand surgery both consulted, recommend conservative measures with pain control and outpatient follow-up. Hypoxia currently stabilizing. 2/26, now medically stable.  Ready for discharge to SNF.  Most likely will require completing isolation in the hospital.  Assessment and Plan: * Pneumonia due to COVID-19 virus- (present on admission) Acute Hypoxic respiratory failure, POA, 80 % on room air on admission secondary to acute COVID-19 Viral Pneumonia CXR: hazy bilateral peripheral opacities Oxygen requirement: On 3 L of oxygen.  Dropped down to 89% on 2 L. CRP: Improving Remdesivir: Started on 2/22 Steroids: 1 mg/kg IV Solu-Medrol started on 2/22, switch to oral prednisone 2/25. Baricitinib/Actemra: Currently holding off Antibiotics: Received 1 dose of IV ceftriaxone azithromycin in the ER procalcitonin marginally elevated.  Now on oral doxycycline for cellulitis Isolation duration: First positive test date 08/11/2021, last day of isolation 08/20/2021  Reported ulcerative colitis ([place])- (present on admission) Patient reports that she has a history of ulcerative colitis and was on sulfasalazine but does not take any medication. Has not seen GI in a while. Last colonoscopy was a few years ago. Currently will be on IV steroids which should be helpful for possible flareup of colitis. CT abdomen on 2/8 when she initially presented with diarrhea does not show any evidence of acute  inflammation. Patient follows up with Dr. Alan Ripper at [facility] for GI.  Recommend close follow-up.  Patient will require ongoing prednisone therapy for this condition and is seen by GI.  Unwitnessed fall- (present on admission) Left hip greater trochanteric fracture, closed Closed fracture of left middle finger. Currently her left middle finger is splinted. Patient still has some pain. Discussed with orthopedic hand surgery, recommend outpatient follow-up as the middle finger appears to be fractured him almost a month ago. Left hip x-ray shows evidence of greater oblique fracture nondisplaced.  Orthopedic consulted Dr. Lorin Mercy.  Recommend conservative management without any intervention, weightbearing touchdown.  We will continue pain control.  PT OT consult.  Cellulitis of right buttock- (present on admission) Indurated folliculitis like picture on the right buttock area.  No evidence of fluctuation.  Will treat with oral doxycycline.  Acute metabolic encephalopathy- (present on admission) Visual hallucination.  Resolved as of 2/25. Sister reported that the patient has been hallucinating for the last few days prior to admission. Mentation improving.  Hypokalemia- (present on admission) Corrected.  No more PVC.  Oral thrush In the setting of steroid use. Will initiate nystatin.  Pressure ulcer- (present on admission) Stage III sacral medial pressure ulcer, present on admission. Wound care consulted.  Appreciate assistance. Pressure Injury 08/11/21 Sacrum Mid Stage 3 -  Full thickness tissue loss. Subcutaneous fat may be visible but bone, tendon or muscle are NOT exposed. (Active)  08/11/21 2015  Location: Sacrum  Location Orientation: Mid  Staging: Stage 3 -  Full thickness tissue loss. Subcutaneous fat may be visible but bone, tendon or muscle are NOT exposed.  Wound Description (Comments):   Present on Admission: Yes    Morbid obesity ([place])- (present on  admission) Body mass index is 28.62 kg/m.  BMI improved significantly.  No morbid obesity right now.  H/O: CVA (cerebrovascular accident) On Plavix. CT of the head this admission as well as prior ER visit shows no acute abnormality.  No focal deficit at the time of my evaluation which is new.  Depression- (present on admission) On Zoloft.  Resume tomorrow.  Essential hypertension- (present on admission) Blood pressure normal. Currently does not require any therapy.  CAD (coronary artery disease)- (present on admission) Hyperlipidemia Continue Plavix, hold Crestor. Follows up with Dr. Einar Gip  Subjective: No nausea no vomiting no fever no chills.  No chest pain abdominal pain.  Physical Exam: Vitals:   08/15/21 0552 08/15/21 1329 08/15/21 2046 08/16/21 0523  BP: 123/77 139/65 (!) 128/96 140/85  Pulse: 87 83 81 73  Resp:  19 18 16   Temp: 97.9 F (36.6 C) 98.2 F (36.8 C) 98.2 F (36.8 C) 98.3 F (36.8 C)  TempSrc: Oral Oral Oral Oral  SpO2: 91% 94% 97% 93%  Weight:      Height:       General: Appear in mild distress; no visible Abnormal Neck Mass Or lumps, Conjunctiva normal Cardiovascular: S1 and S2 Present, no Murmur, Respiratory: good respiratory effort, Bilateral Air entry present and CTA, no Crackles, no wheezes Abdomen: Bowel Sound present Extremities: no Pedal edema Neurology: alert and oriented to time, place, and person Gait not checked due to patient safety concerns   Data Reviewed:  I have Reviewed nursing notes, Vitals, and Lab results since pt's last encounter. Pertinent lab results CBC, BMP, CRP.    Family Communication: No one at bedside.  Disposition: Status is: Inpatient Remains inpatient appropriate because: Unsafe discharge.  Need to go to SNF.  Lives alone.    Author: Berle Mull, MD 08/16/2021 7:46 PM  For on call review [email or web address].

## 2021-08-16 NOTE — Progress Notes (Signed)
Speech Language Pathology Treatment: Dysphagia  Patient Details Name: Sandra [name] MRN: [id number] DOB: 1960-11-06 Today's Date: 08/16/2021 Time: 3085-6943 SLP Time Calculation (min) (ACUTE ONLY): 15 min  Assessment / Plan / Recommendation Clinical Impression  Patient seen by SLP with her sister present in room for skilled treatment session focused on dysphagia. Patient asked SLP "are you going to do a swallow test?" SLP explained that plan was to observe her with some liquid PO intake and provide education to her and her sister. Patient consumed large straw sips of thin liquids (apple juice) with timely swallow initiation and no overt s/s aspiration or penetration. Patient reported that she has to drink sips of liquids after eating solids to "help it go down". Her sister added that patient tried to take all of her pills at once and that when eating solids, she is "like a chipmunk", packing cheeks with food and then swallowing over time. Patient admitted to this and stated "thats how I've always done it". Sister also stated that she did not notice anything new about patient's current swallow function. SLP recommended patient take pills one at a time in applesauce or pudding, etc. and to try to avoid eating/drinking too rapidly. As patient's dysphagia does not appear to be significantly changed since prior to this hospitalization, SLP is not recommending contiued SLP intervention and will s/o at this time.    HPI HPI: Pt adm to [facility] with hip fx after fall - f/u with ortho as op - no surgery now d/t covid, twb left severe resp fx COVID 19 +, stroke hx, diarrhea prior to admit for 3 weeks, walks with walker, CVA, CAD, HTN, depression, HLD, PFO, ulcerative colitis, active smoker.  CXR 2/22 Findings suspicious for left greater than right pneumonia. Brain imaging showed Maxillary sinusitis 2. Large RIGHT MCA territory remote infarction 01/2011 = right parietal, temporal, corona radiata, silent penetration of thin  with mixed on mbs, soft/thin advised 01/2011.      SLP Plan  Discharge SLP treatment due to (comment);All goals met      Recommendations for follow up therapy are one component of a multi-disciplinary discharge planning process, led by the attending physician.  Recommendations may be updated based on patient status, additional functional criteria and insurance authorization.    Recommendations  Diet recommendations: Regular;Thin liquid Liquids provided via: Cup;Straw Medication Administration: Whole meds with puree Supervision: Patient able to self feed Compensations: Slow rate;Small sips/bites;Lingual sweep for clearance of pocketing Postural Changes and/or Swallow Maneuvers: Seated upright 90 degrees                Follow Up Recommendations: No SLP follow up Assistance recommended at discharge: Frequent or constant Supervision/Assistance SLP Visit Diagnosis: Dysphagia, unspecified (R13.10) Plan: Discharge SLP treatment due to (comment);All goals met         Sonia Baller, MA, CCC-SLP Speech Therapy

## 2021-08-16 NOTE — TOC Progression Note (Signed)
Transition of Care [facility]) - Progression Note    Patient Details  Name: Sandra [name] MRN: [id number] Date of Birth: [birth year]  Transition of Care [facility]) CM/SW Contact  Purcell Mouton, RN Phone Number: 08/16/2021, 4:21 PM  Clinical Narrative:     Annia Friendly was started. Pt is a Level II. Pt maiden name is Price which PASSR was under before.        Expected Discharge Plan and Services                                                 Social Determinants of Health (SDOH) Interventions    Readmission Risk Interventions No flowsheet data found.

## 2021-08-16 NOTE — Progress Notes (Signed)
Physical Therapy Treatment Patient Details Name: Sandra [name] MRN: [id number] DOB: 1961-06-03 Today's Date: 08/16/2021   History of Present Illness 61 yo female presents to [facility] on 2/22 with fall x3 weeks ago with progressive generalized weakness, fatigue, AMS. CXR shows L>R PNA, covid + requiring supplemental O2. xray shows L greater trochanter fx, nondisplaced. Pt with recent ED admission x2 weeks ago with colitis and L middle finger fx now splinted, d/c home after this. PMH includes R MCA CVA 2006, anxiety, OA, depression, HTN, HLD, CAD with MI, PFO, pre DM, tobacco abuse.    PT Comments    Patient making good progress with mobility and despite fear of falling very willing to work on progressing mobility. Pt required Mod+2 assist to rise to EVA walker and cues to maintain TDWB on Lt LE while hopping Rt forward to initiate gait training. Pt able to control lowering to recliner with cues for safe reach back. Continue to recommend SNF rehab for pt, acute PT will progress as able.    Recommendations for follow up therapy are one component of a multi-disciplinary discharge planning process, led by the attending physician.  Recommendations may be updated based on patient status, additional functional criteria and insurance authorization.  Follow Up Recommendations  Skilled nursing-short term rehab (<3 hours/day)     Assistance Recommended at Discharge Frequent or constant Supervision/Assistance  Patient can return home with the following Two people to help with walking and/or transfers;A lot of help with bathing/dressing/bathroom;Assistance with cooking/housework;Direct supervision/assist for medications management;Direct supervision/assist for financial management;Assist for transportation   Equipment Recommendations  None recommended by PT    Recommendations for Other Services       Precautions / Restrictions Precautions Precautions: Fall Precaution Comments: 3LO2 via [place], Left hemiparesis,  left middle finger splint and wrist contracture Restrictions Weight Bearing Restrictions: Yes LLE Weight Bearing: Touchdown weight bearing     Mobility  Bed Mobility Overal bed mobility: Needs Assistance Bed Mobility: Supine to Sit     Supine to sit: Min assist, HOB elevated     General bed mobility comments: assist to fully raise trunk to EOB, pt able to use bed rail to pull up and able to initiate bringing LE's off EOB    Transfers Overall transfer level: Needs assistance Equipment used: Bilateral platform walker (EVA walker) Transfers: Sit to/from Stand Sit to Stand: Mod assist, +2 safety/equipment, +2 physical assistance, From elevated surface           General transfer comment: Mod+2 for power up from elevated EOB, pt using platform on EVA walker to pull up and single leg power up on Rt LE. pt maintained TDWB on Lt with cues.    Ambulation/Gait Ambulation/Gait assistance: Mod assist, +2 physical assistance, +2 safety/equipment Gait Distance (Feet): 6 Feet Assistive device: Ethelene Hal Gait Pattern/deviations: Step-to pattern, Decreased stride length Gait velocity: decr     General Gait Details: Mod +2 for safety with EVA walker to hop Rt foot to ambulate short forward distance. cues for pt to lean into platform for support to maintain TDWB on Lt LE. chair brought up behind pt to sit.   Stairs             Wheelchair Mobility    Modified Rankin (Stroke Patients Only)       Balance Overall balance assessment: Needs assistance Sitting-balance support: No upper extremity supported, Feet supported Sitting balance-Leahy Scale: Good     Standing balance support: During functional activity Standing balance-Leahy Scale: Poor  Cognition Arousal/Alertness: Awake/alert Behavior During Therapy: Anxious Overall Cognitive Status: Within Functional Limits for tasks assessed                                           Exercises      General Comments General comments (skin integrity, edema, etc.): VSS      Pertinent Vitals/Pain Pain Assessment Pain Assessment: No/denies pain    Home Living                          Prior Function            PT Goals (current goals can now be found in the care plan section) Acute Rehab PT Goals Patient Stated Goal: get home to my dogs PT Goal Formulation: With patient Time For Goal Achievement: 08/27/21 Potential to Achieve Goals: Fair Progress towards PT goals: Progressing toward goals    Frequency    Min 2X/week      PT Plan Current plan remains appropriate    Co-evaluation              AM-PAC PT "6 Clicks" Mobility   Outcome Measure  Help needed turning from your back to your side while in a flat bed without using bedrails?: A Little Help needed moving from lying on your back to sitting on the side of a flat bed without using bedrails?: A Little Help needed moving to and from a bed to a chair (including a wheelchair)?: A Lot Help needed standing up from a chair using your arms (e.g., wheelchair or bedside chair)?: A Lot Help needed to walk in hospital room?: Total Help needed climbing 3-5 steps with a railing? : Total 6 Click Score: 12    End of Session Equipment Utilized During Treatment: Gait belt Activity Tolerance: Patient tolerated treatment well Patient left: in chair;with call bell/phone within reach;with chair alarm set Nurse Communication: Mobility status PT Visit Diagnosis: Other abnormalities of gait and mobility (R26.89);Muscle weakness (generalized) (M62.81)     Time: 8850-2774 PT Time Calculation (min) (ACUTE ONLY): 23 min  Charges:  $Gait Training: 8-22 mins $Therapeutic Activity: 8-22 mins                     Verner Mould, DPT Acute Rehabilitation Services Office [phone] Pager [phone]    Jacques Navy 08/16/2021, 4:28 PM

## 2021-08-16 NOTE — Progress Notes (Signed)
Occupational Therapy Treatment Patient Details Name: Sandra [name] MRN: [id number] DOB: [birth year] Today's Date: 08/16/2021   History of present illness 61 yo female presents to [facility] on 2/22 with fall x3 weeks ago with progressive generalized weakness, fatigue, AMS. CXR shows L>R PNA, covid + requiring supplemental O2. xray shows L greater trochanter fx, nondisplaced. Pt with recent ED admission x2 weeks ago with colitis and L middle finger fx now splinted, d/c home after this. PMH includes R MCA CVA 2006, anxiety, OA, depression, HTN, HLD, CAD with MI, PFO, pre DM, tobacco abuse.   OT comments  Chart reviewed, pt agreeable to OT tx session. Tx session targeted progressing independence during ADL tasks and improving functional mobility to facilitate return to PLOF. Pt with noted improved tolerance and performance during mobility and ADL tasks on this date as evidenced by decreased assistance levels required for ADLs and pt use of platform walker to amb in room +2. Vcs required throughout for appropriate TWBing however carry over noted with technique with continued practice. Pt is left in bedside chair, NAD, all needs met. Discharge recommendation remains appropriate, OT will continue to follow.    Recommendations for follow up therapy are one component of a multi-disciplinary discharge planning process, led by the attending physician.  Recommendations may be updated based on patient status, additional functional criteria and insurance authorization.    Follow Up Recommendations  Skilled nursing-short term rehab (<3 hours/day)    Assistance Recommended at Discharge Frequent or constant Supervision/Assistance  Patient can return home with the following  Two people to help with walking and/or transfers;Two people to help with bathing/dressing/bathroom   Equipment Recommendations  None recommended by OT    Recommendations for Other Services      Precautions / Restrictions Precautions Precautions:  Fall Restrictions Weight Bearing Restrictions: Yes LLE Weight Bearing: Touchdown weight bearing       Mobility Bed Mobility Overal bed mobility: Needs Assistance Bed Mobility: Supine to Sit     Supine to sit: Min assist, HOB elevated          Transfers     Transfers: Sit to/from Stand Sit to Stand: Mod assist, +2 physical assistance                 Balance Overall balance assessment: Needs assistance Sitting-balance support: No upper extremity supported, Feet supported Sitting balance-Leahy Scale: Good     Standing balance support: During functional activity Standing balance-Leahy Scale: Poor                             ADL either performed or assessed with clinical judgement   ADL Overall ADL's : Needs assistance/impaired Eating/Feeding: Set up;Sitting   Grooming: Set up;Sitting   Upper Body Bathing: Minimal assistance;Sitting   Lower Body Bathing: Moderate assistance;Sitting/lateral leans   Upper Body Dressing : Minimal assistance;Sitting   Lower Body Dressing: Total assistance;Bed level Lower Body Dressing Details (indicate cue type and reason): socks Toilet Transfer: +2 for physical assistance;Moderate assistance;+2 for safety/equipment Toilet Transfer Details (indicate cue type and reason): simulated to bedside chair with platform walker- pt ambulated approx 5 feet forward, chair was brought behind her         Functional mobility during ADLs: Moderate assistance;+2 for physical assistance      Extremity/Trunk Assessment              Vision       Perception     Praxis  Cognition Arousal/Alertness: Awake/alert Behavior During Therapy: Anxious Overall Cognitive Status: Within Functional Limits for tasks assessed                                 General Comments: Pt continues to be fearful of falling throughout tx session however good one step directive following with carry over noted with vcs provided  for TWBing technique        Exercises      Shoulder Instructions       General Comments vss throughout, 3L o2 via [place]    Pertinent Vitals/ Pain       Pain Assessment Pain Assessment: 0-10 Pain Score: 7  Pain Location: Left hip Pain Descriptors / Indicators: Grimacing Pain Intervention(s): Limited activity within patient's tolerance, Monitored during session, Repositioned  Home Living                                          Prior Functioning/Environment              Frequency  Min 2X/week        Progress Toward Goals  OT Goals(current goals can now be found in the care plan section)  Progress towards OT goals: Progressing toward goals  Acute Rehab OT Goals Patient Stated Goal: to get home to her dogs OT Goal Formulation: With patient Time For Goal Achievement: 08/30/21 Potential to Achieve Goals: Good  Plan Discharge plan remains appropriate    Co-evaluation                 AM-PAC OT "6 Clicks" Daily Activity     Outcome Measure   Help from another person eating meals?: None Help from another person taking care of personal grooming?: A Little Help from another person toileting, which includes using toliet, bedpan, or urinal?: A Lot Help from another person bathing (including washing, rinsing, drying)?: A Lot Help from another person to put on and taking off regular upper body clothing?: A Little Help from another person to put on and taking off regular lower body clothing?: Total 6 Click Score: 15    End of Session Equipment Utilized During Treatment: Gait belt;Other (comment) (platform walker)  OT Visit Diagnosis: Other abnormalities of gait and mobility (R26.89);Pain;Hemiplegia and hemiparesis   Activity Tolerance Patient tolerated treatment well   Patient Left in chair;with call bell/phone within reach;with chair alarm set   Nurse Communication Mobility status        Time: 7408-1448 OT Time Calculation (min): 12  min  Charges: OT General Charges $OT Visit: 1 Visit OT Treatments $Self Care/Home Management : 8-22 mins  Shanon Payor, OTD OTR/L  08/16/21, 1:15 PM

## 2021-08-17 MED ORDER — SERTRALINE HCL 100 MG PO TABS
100.0000 mg | ORAL_TABLET | Freq: Every day | ORAL | Status: DC
  Administered 2021-08-18 – 2021-08-20 (×3): 100 mg via ORAL
  Filled 2021-08-17 (×3): qty 1

## 2021-08-17 MED ORDER — ROSUVASTATIN CALCIUM 20 MG PO TABS
40.0000 mg | ORAL_TABLET | Freq: Every day | ORAL | Status: DC
Start: 2021-08-17 — End: 2021-08-21
  Administered 2021-08-18 – 2021-08-20 (×3): 40 mg via ORAL
  Filled 2021-08-17 (×3): qty 2

## 2021-08-17 NOTE — Care Management Important Message (Signed)
Important Message  Patient Details IM Letter placed in Patients room. Name: Sandra [name] MRN: [id number] Date of Birth: [birth year]   Medicare Important Message Given:  Yes     Kerin Salen 08/17/2021, 10:49 AM

## 2021-08-17 NOTE — Assessment & Plan Note (Signed)
Currently her left middle finger is splinted. Patient was reported to Dr. Carmelia Bake outpatient. Discussed with Sandra [name] on-call for Dr. Lequita Asal group, patient's fracture appears to be more than 69 month old. Recommend outpatient follow-up.

## 2021-08-17 NOTE — NC FL2 (Signed)
[place] LEVEL OF CARE SCREENING TOOL     IDENTIFICATION  Patient Name: Sandra [name] Birthdate: [birth year] Sex: female Admission Date (Current Location): 08/11/2021  [facility] and [place] Number:  Herbalist and Address:  [facility],  [address], [place]      Provider Number: [id number]  Attending Physician Name and Address:  Lavina Hamman, MD  Relative Name and Phone Number:  Delene Loll   [phone]    Current Level of Care: Hospital Recommended Level of Care: [place] Prior Approval Number:    Date Approved/Denied:   PASRR Number:    Discharge Plan: SNF    Current Diagnoses: Patient Active Problem List   Diagnosis Date Noted   Oral thrush 08/15/2021   Cellulitis of right buttock 08/13/2021   Pressure ulcer 08/13/2021   Pneumonia due to COVID-19 virus 08/11/2021   Unwitnessed fall 08/11/2021   Closed left hip fracture, sequela 08/11/2021   Closed fracture of phalanx of left middle finger 12/75/1700   Acute metabolic encephalopathy 17/49/4496   Hallucination 08/11/2021   Acute respiratory failure with hypoxia ([place]) 08/11/2021   Mature cataract 04/06/2020   Visual field defect 04/06/2020   Hyperopia of left eye with astigmatism and presbyopia 04/06/2020   Phthisis bulbi of right eye 04/06/2020   Colon polyps 08/21/2017   Symptomatic cholelithiasis 04/26/2017   Undiagnosed cardiac murmurs 06/24/2016   Morbid obesity ([place]) 06/05/2015   GAD (generalized anxiety disorder) 04/08/2015   Spastic hemiplegia affecting nondominant side ([place]) 04/02/2015   Benzodiazepine withdrawal with complication ([place]) 75/91/6384   Hypokalemia 12/23/2013   Cold intolerance 11/25/2013   Recurrent pneumonia 11/06/2013   MRSA bacteremia 11/06/2013   Bilateral endophthalmitis 66/59/9357   Metabolic syndrome 01/77/9390   Routine adult health maintenance 08/13/2013   Dyspareunia, female 02/06/2013   Lumbar and  sacral osteoarthritis 12/06/2012   Reported ulcerative colitis ([place]) 11/15/2012   Insomnia 07/23/2012   HIV exposure 06/27/2012   Gout 01/31/2012   Benzodiazepine dependence, continuous ([place]) 01/26/2012   Hot flashes 01/09/2012   Tobacco abuse 01/09/2012   Tobacco dependence syndrome 01/09/2012   Arthritis 04/16/2011   Anxiety 04/14/2011   Incontinence 04/14/2011   PFO (patent foramen ovale) 03/07/2011   CAD (coronary artery disease) 03/07/2011   Essential hypertension 03/07/2011   Hyperlipidemia 03/07/2011   Depression 03/07/2011   Psoriasis 03/07/2011   H/O: CVA (cerebrovascular accident) 01/27/2011    Orientation RESPIRATION BLADDER Height & Weight     Self, Time, Situation, Place  O2 (3L O2 [place]) Incontinent Weight: 78 kg Height:  5\' 5"  (165.1 cm)  BEHAVIORAL SYMPTOMS/MOOD NEUROLOGICAL BOWEL NUTRITION STATUS      Incontinent Diet ([place] sodium)  AMBULATORY STATUS COMMUNICATION OF NEEDS Skin   Extensive Assist Verbally PU Stage and Appropriate Care (sacrum area)     PU Stage 3 Dressing: Daily                 Personal Care Assistance Level of Assistance  Bathing, Feeding, Dressing Bathing Assistance: Maximum assistance Feeding assistance: Limited assistance Dressing Assistance: Maximum assistance     Functional Limitations Info  Sight, Hearing, Speech Sight Info: Impaired Hearing Info: Adequate Speech Info: Adequate    SPECIAL CARE FACTORS FREQUENCY  PT (By licensed PT), OT (By licensed OT) (Left side weakness)     PT Frequency: x5 week OT Frequency: x5 week            Contractures Contractures Info: Not present (Left side weakness, Arm  and Leg)    Additional Factors Info  Code Status, Psychotropic Code Status Info: FULL   Psychotropic Info: Klonopin 1mg  BID         Current Medications (08/17/2021):  This is the current hospital active medication list Current Facility-Administered Medications  Medication Dose Route Frequency Provider Last Rate  Last Admin   (feeding supplement) PROSource Plus liquid 30 mL  30 mL Oral BID BM Lavina Hamman, MD   30 mL at 08/16/21 2002   acetaminophen (TYLENOL) tablet 650 mg  650 mg Oral Q6H PRN Lavina Hamman, MD   650 mg at 08/15/21 2121   Or   acetaminophen (TYLENOL) suppository 650 mg  650 mg Rectal Q6H PRN Lavina Hamman, MD       allopurinol (ZYLOPRIM) tablet 300 mg  300 mg Oral Daily Lavina Hamman, MD   300 mg at 08/17/21 9767   ascorbic acid (VITAMIN C) tablet 500 mg  500 mg Oral Daily Lavina Hamman, MD   500 mg at 08/17/21 0921   atropine 1 % ophthalmic solution 1 drop  1 drop Right Eye Daily Lavina Hamman, MD   1 drop at 08/17/21 0930   benzonatate (TESSALON) capsule 100 mg  100 mg Oral TID Lavina Hamman, MD   100 mg at 08/17/21 3419   clopidogrel (PLAVIX) tablet 75 mg  75 mg Oral Daily Lavina Hamman, MD   75 mg at 08/17/21 3790   dextromethorphan-guaiFENesin (MUCINEX DM) 30-600 MG per 12 hr tablet 1 tablet  1 tablet Oral BID Lavina Hamman, MD   1 tablet at 08/17/21 2409   doxycycline (VIBRA-TABS) tablet 100 mg  100 mg Oral Q12H Lavina Hamman, MD   100 mg at 08/17/21 0921   enoxaparin (LOVENOX) injection 40 mg  40 mg Subcutaneous Q24H Lavina Hamman, MD   40 mg at 08/16/21 2232   feeding supplement (ENSURE ENLIVE / ENSURE PLUS) liquid 237 mL  237 mL Oral BID BM Lavina Hamman, MD   237 mL at 08/14/21 0917   HYDROcodone-acetaminophen (NORCO/VICODIN) 5-325 MG per tablet 1 tablet  1 tablet Oral Q6H PRN Lavina Hamman, MD   1 tablet at 08/17/21 7353   Ipratropium-Albuterol (COMBIVENT) respimat 1 puff  1 puff Inhalation TID Lavina Hamman, MD   1 puff at 08/17/21 0826   loperamide (IMODIUM) capsule 2 mg  2 mg Oral QID PRN Lavina Hamman, MD   2 mg at 08/15/21 2992   multivitamin with minerals tablet 1 tablet  1 tablet Oral Daily Lavina Hamman, MD   1 tablet at 08/16/21 1649   nutrition supplement (JUVEN) (JUVEN) powder packet 1 packet  1 packet Oral BID BM Lavina Hamman, MD    1 packet at 08/14/21 0915   nystatin (MYCOSTATIN) 100000 UNIT/ML suspension 500,000 Units  5 mL Oral QID Lavina Hamman, MD   500,000 Units at 08/17/21 0929   ondansetron (ZOFRAN) tablet 4 mg  4 mg Oral Q6H PRN Lavina Hamman, MD       Or   ondansetron [facility]) injection 4 mg  4 mg Intravenous Q6H PRN Lavina Hamman, MD   4 mg at 08/12/21 0014   prednisoLONE acetate (PRED FORTE) 1 % ophthalmic suspension 1 drop  1 drop Right Eye BID Lavina Hamman, MD   1 drop at 08/17/21 4268   predniSONE (DELTASONE) tablet 50 mg  50 mg Oral Q breakfast Lavina Hamman, MD  50 mg at 08/17/21 9574   zinc sulfate capsule 220 mg  220 mg Oral Daily Lavina Hamman, MD   220 mg at 08/17/21 7340     Discharge Medications: Please see discharge summary for a list of discharge medications.  Relevant Imaging Results:  Relevant Lab Results:   Additional Information [id number]  Purcell Mouton, RN

## 2021-08-17 NOTE — Assessment & Plan Note (Signed)
Left hip x-ray shows evidence of greater oblique fracture nondisplaced.   Orthopedic consulted Dr. Lorin Mercy.  Recommend conservative management without any intervention, weightbearing touchdown. We will continue pain control.

## 2021-08-17 NOTE — Care Management (Signed)
30 Day [place] Note  RE: [id number]        Date of Birth: __ 03-Jun-1961  Date: 08/17/2021       To Whom It May Concern:  Please be advised that the above-named patient will require a short-term nursing home stay - anticipated 30 days or less for rehabilitation and strengthening.  The plan is for return home.   Purcell Mouton, [place], [place]

## 2021-08-17 NOTE — Progress Notes (Signed)
Progress Note   Patient: Sandra [name] KLK:[id number] DOB: 1961/02/13 DOA: 08/11/2021     Hospitalization day: 6 DOS: the patient was seen and examined on 08/17/2021   Brief hospital course:  Sandra [name] is a 61 y.o. female with medical history significant of CVA, CAD, HTN, depression, HLD, PFO, active smoker. Patient presents with complaints of progressively worsening lethargy and fatigue as well as muscle cramps. Found to have COVID-19 infection with acute hypoxia along with left hip fracture. Orthopedic and hand surgery both consulted, recommend conservative measures with pain control and outpatient follow-up. Hypoxia currently stabilizing. 2/26, now medically stable.  Ready for discharge to SNF.  Most likely will require completing isolation in the hospital.  Assessment and Plan: * Pneumonia due to COVID-19 virus- (present on admission) Acute Hypoxic respiratory failure, POA, 80 % on room air on admission secondary to acute COVID-19 Viral Pneumonia CXR: hazy bilateral peripheral opacities Oxygen requirement: On 3 L of oxygen.  Dropped down to 89% on 2 L. CRP: Improving Remdesivir: Started on 2/22 Steroids: 1 mg/kg IV Solu-Medrol started on 2/22, switch to oral prednisone 2/25 Baricitinib/Actemra: Currently holding off Antibiotics: Received 1 dose of IV ceftriaxone azithromycin in the ER procalcitonin marginally elevated.  Now on oral doxycycline for cellulitis Isolation duration: First positive test date 08/11/2021, last day of isolation 08/20/2021  Reported ulcerative colitis ([place])- (present on admission) Patient reports that she has a history of ulcerative colitis and was on sulfasalazine but does not take any medication. Has not seen GI in a while. Last colonoscopy was a few years ago. Currently will be on IV steroids which should be helpful for possible flareup of colitis. CT abdomen on 2/8 when she initially presented with diarrhea does not show any evidence of acute  inflammation. Patient follows up with Dr. Alan Ripper at [facility] for [facility].  Recommend close follow-up.   Patient will require ongoing prednisone therapy for this condition and is seen by GI.  Closed fracture of phalanx of left middle finger- (present on admission) Currently her left middle finger is splinted. Patient was reported to Dr. Carmelia Bake outpatient. Discussed with Fara Boros on-call for Dr. Lequita Asal group, patient's fracture appears to be more than 28 month old. Recommend outpatient follow-up.  Closed left hip fracture, sequela Left hip x-ray shows evidence of greater oblique fracture nondisplaced.   Orthopedic consulted Dr. Lorin Mercy.  Recommend conservative management without any intervention, weightbearing touchdown. We will continue pain control.  Unwitnessed fall- (present on admission) Likely metabolic in the setting of hypoxia in the setting of COVID-19 infection. No dizziness no focal deficit. No other etiology identified.  Cellulitis of right buttock- (present on admission) Indurated folliculitis like picture on the right buttock area. No evidence of fluctuation.   Will treat with oral doxycycline.  Acute metabolic encephalopathy- (present on admission) Visual hallucination.  Resolved as of 2/25. Sister reported that the patient has been hallucinating for the last few days prior to admission.  Mentation at baseline. Currently I do not think the patient requires resumption of her gabapentin as there is no significant neuropathic pain component or resumption of Seroquel as there is no agitation or psychotic behaviors.  Hypokalemia- (present on admission) Corrected.  No more PVC.  Oral thrush In the setting of steroid use. Will initiate nystatin.  Pressure ulcer- (present on admission) Stage III sacral medial pressure ulcer, present on admission. Wound care consulted.  Appreciate assistance. Pressure Injury 08/11/21 Sacrum Mid Stage 3 -  Full thickness  tissue loss. Subcutaneous fat  may be visible but bone, tendon or muscle are NOT exposed. (Active)  08/11/21 2015  Location: Sacrum  Location Orientation: Mid  Staging: Stage 3 -  Full thickness tissue loss. Subcutaneous fat may be visible but bone, tendon or muscle are NOT exposed.  Wound Description (Comments):   Present on Admission: Yes    Morbid obesity ([place])- (present on admission) Body mass index is 28.62 kg/m.  BMI improved significantly.  No morbid obesity right now.  H/O: CVA (cerebrovascular accident) On Plavix. CT of the head this admission as well as prior ER visit shows no acute abnormality.  No focal deficit at the time of my evaluation which is new.  Depression- (present on admission) On Zoloft.  Essential hypertension- (present on admission) Blood pressure normal. Currently does not require any therapy.  CAD (coronary artery disease)- (present on admission) Hyperlipidemia Continue Plavix, hold Crestor. Follows up with Dr. Einar Gip  Subjective: No acute complaint.  No nausea no vomiting.  Pain well controlled.  Oral intake adequate.  Physical Exam: Vitals:   08/17/21 1203 08/17/21 1318 08/17/21 1422 08/17/21 1933  BP: (!) 142/72   133/65  Pulse: 68   82  Resp: (!) 22   20  Temp: 98 F (36.7 C)   98.3 F (36.8 C)  TempSrc: Oral   Oral  SpO2: 99% 99% 95% 93%  Weight:      Height:       General: Appear in mild distress; no visible Abnormal Neck Mass Or lumps, Conjunctiva normal Cardiovascular: S1 and S2 Present, no Murmur, Respiratory: good respiratory effort, Bilateral Air entry present and CTA, no Crackles, no wheezes Abdomen: Bowel Sound present Extremities: no Pedal edema Neurology: alert and oriented to time, place, and person Gait not checked due to patient safety concerns   Data Reviewed: I have ordered CBC and BMP tomorrow.  Family Communication: Discussed with sister on 2/27.  None at bedside.  Disposition: Status is: Inpatient Remains  inpatient appropriate because: Ongoing isolation.  Need SNF discharge.  Author: Berle Mull, MD 08/17/2021 8:54 PM  For on call review [email or web address].

## 2021-08-18 MED ORDER — SPIRONOLACTONE 25 MG PO TABS
25.0000 mg | ORAL_TABLET | Freq: Every day | ORAL | Status: DC
  Administered 2021-08-18 – 2021-08-20 (×3): 25 mg via ORAL
  Filled 2021-08-18 (×3): qty 1

## 2021-08-18 NOTE — Progress Notes (Signed)
Occupational Therapy Treatment ?Patient Details ?Name: Sandra [name] ?MRN: [id number] ?DOB: 02-10-1961 ?Today's Date: 08/18/2021 ? ? ?History of present illness 61 yo female presents to [facility] on 2/22 with fall x3 weeks ago with progressive generalized weakness, fatigue, AMS. CXR shows L>R PNA, covid + requiring supplemental O2. xray shows L greater trochanter fx, nondisplaced. Pt with recent ED admission x2 weeks ago with colitis and L middle finger fx now splinted, d/c home after this. PMH includes R MCA CVA 2006, anxiety, OA, depression, HTN, HLD, CAD with MI, PFO, pre DM, tobacco abuse. ?  ?OT comments ? Patient was noted to have continued increased fear of falling with all movement. Patient was educated on scoot transfers with patient verbalizing understanding. Patient was noted to have increased difficulty maintaining WB restrictions with cues and physical assistance and scoot transfer attempt was stopped. Patient was able to transfer with EVA walker from edge of bed to recliner with +2 assistance with better ability to maintain WB restrictions with continued consistent cues. Patient would continue to benefit from skilled OT services at this time while admitted and after d/c to address noted deficits in order to improve overall safety and independence in ADLs.  ?  ? ?Recommendations for follow up therapy are one component of a multi-disciplinary discharge planning process, led by the attending physician.  Recommendations may be updated based on patient status, additional functional criteria and insurance authorization. ?   ?Follow Up Recommendations ? Skilled nursing-short term rehab (<3 hours/day)  ?  ?Assistance Recommended at Discharge Frequent or constant Supervision/Assistance  ?Patient can return home with the following ? Two people to help with walking and/or transfers;Two people to help with bathing/dressing/bathroom ?  ?Equipment Recommendations ? None recommended by OT  ?  ?Recommendations for Other Services    ? ?  ?Precautions / Restrictions Precautions ?Precautions: Fall ?Precaution Comments: Left hemiparesis, left middle finger splint and wrist contracture ?Restrictions ?Weight Bearing Restrictions: Yes ?LLE Weight Bearing: Touchdown weight bearing  ? ? ?  ? ?Mobility Bed Mobility ?Overal bed mobility: Needs Assistance ?Bed Mobility: Supine to Sit ?  ?  ?Supine to sit: HOB elevated, Mod assist ?  ?  ?  ?  ? ?Transfers ?  ?  ?  ?  ?  ?  ?  ?  ?  ?  ?  ?  ?Balance Overall balance assessment: Needs assistance ?Sitting-balance support: No upper extremity supported, Feet supported ?Sitting balance-Leahy Scale: Good ?  ?  ?Standing balance support: During functional activity, Reliant on assistive device for balance, Bilateral upper extremity supported ?Standing balance-Leahy Scale: Poor ?  ?  ?  ?  ?  ?  ?  ?  ?  ?  ?  ?  ?   ? ?ADL either performed or assessed with clinical judgement  ? ?ADL Overall ADL's : Needs assistance/impaired ?  ?  ?  ?  ?  ?  ?  ?  ?  ?  ?  ?  ?  ?  ?  ?  ?  ?  ?  ?General ADL Comments: patient declined to wash up at this time. patient was mod A for supine to sit on edge of bed with increased physical assist for BLE and trunk. patient needed cues for sitting balance on edge of bed. patient was extensivly educated on scooting to chair with patient unable to maintain WB restitctions with max education and physical assistance. patient noted to have increased fear of falling with all movements. patient's  fears were validated. patient was educated on importance of verbalizing fears and talking throught them to reduce effets on safety with movement. patient verbalized understanding. ?  ? ?Extremity/Trunk Assessment   ?  ?  ?  ?  ?  ? ?Vision   ?  ?  ?Perception   ?  ?Praxis   ?  ? ?Cognition Arousal/Alertness: Awake/alert ?Behavior During Therapy: Anxious ?Overall Cognitive Status: Within Functional Limits for tasks assessed ?  ?  ?  ?  ?  ?  ?  ?  ?  ?  ?  ?  ?  ?  ?  ?  ?General Comments: patient has  increased fear of falling impacting participation and adhering to WB restrictions ?  ?  ?   ?Exercises   ? ?  ?Shoulder Instructions   ? ? ?  ?General Comments Pt on RA with SpO2 96-98%  ? ? ?Pertinent Vitals/ Pain       Pain Assessment ?Pain Assessment: 0-10 ?Faces Pain Scale: Hurts little more ?Pain Location: Left hip ?Pain Descriptors / Indicators: Grimacing ?Pain Intervention(s): Limited activity within patient's tolerance, Monitored during session, Premedicated before session, Repositioned ? ?Home Living   ?  ?  ?  ?  ?  ?  ?  ?  ?  ?  ?  ?  ?  ?  ?  ?  ?  ?  ? ?  ?Prior Functioning/Environment    ?  ?  ?  ?   ? ?Frequency ? Min 2X/week  ? ? ? ? ?  ?Progress Toward Goals ? ?OT Goals(current goals can now be found in the care plan section) ? Progress towards OT goals: Progressing toward goals ? ?   ?Plan Discharge plan remains appropriate   ? ?Co-evaluation ? ? ? PT/OT/SLP Co-Evaluation/Treatment: Yes ?Reason for Co-Treatment: For patient/therapist safety;To address functional/ADL transfers ?PT goals addressed during session: Mobility/safety with mobility ?OT goals addressed during session: ADL's and self-care ?  ? ?  ?AM-PAC OT "6 Clicks" Daily Activity     ?Outcome Measure ? ? Help from another person eating meals?: None ?Help from another person taking care of personal grooming?: A Little ?Help from another person toileting, which includes using toliet, bedpan, or urinal?: A Lot ?Help from another person bathing (including washing, rinsing, drying)?: A Lot ?Help from another person to put on and taking off regular upper body clothing?: A Little ?Help from another person to put on and taking off regular lower body clothing?: Total ?6 Click Score: 15 ? ?  ?End of Session Equipment Utilized During Treatment: Gait belt;Other (comment) (EVA) ? ?OT Visit Diagnosis: Other abnormalities of gait and mobility (R26.89);Pain;Hemiplegia and hemiparesis ?Hemiplegia - Right/Left: Left ?Hemiplegia - dominant/non-dominant:  Non-Dominant ?Hemiplegia - caused by: Cerebral infarction ?Pain - Right/Left: Left ?Pain - part of body: Hip ?  ?Activity Tolerance Patient tolerated treatment well ?  ?Patient Left in chair;with call bell/phone within reach;with chair alarm set ?  ?Nurse Communication Mobility status ?  ? ?   ? ?Time: 1024-1100 ?OT Time Calculation (min): 36 min ? ?Charges: OT General Charges ?$OT Visit: 1 Visit ?OT Treatments ?$Therapeutic Activity: 8-22 mins ? ?Jenisha Faison OTR/L, MS ?Acute Rehabilitation Department ?Office# [phone] ?Pager# [phone] ? ? ?[place] ?08/18/2021, 12:41 PM ?

## 2021-08-18 NOTE — TOC Progression Note (Signed)
Transition of Care (TOC) - Progression Note  ? ? ?Patient Details  ?Name: Genia Perin ?MRN: [id number] ?Date of Birth: Jul 18, 1960 ? ?Transition of Care (TOC) CM/SW Contact  ?Purcell Mouton, RN ?Phone Number: ?08/18/2021, 2:02 PM ? ?Clinical Narrative:    ? ?There are no SNF bed offers at present time. TOC will continue to follow. ? ?  ?  ? ?Expected Discharge Plan and Services ?  ?  ?  ?  ?  ?                ?  ?  ?  ?  ?  ?  ?  ?  ?  ?  ? ? ?Social Determinants of Health (SDOH) Interventions ?  ? ?Readmission Risk Interventions ?No flowsheet data found. ? ?

## 2021-08-18 NOTE — Progress Notes (Signed)
Orthopedic Tech Progress Note ?Patient Details:  ?Sandra [name] ?02/05/1961 ?[id number] ? ?[name], female   DOB: 07/18/1960, 61 y.o.   MRN: [id number] ? ?Sandra [name] ?08/18/2021, 1:48 PM ?Ankle foot orthosis ordered from [facility] ?

## 2021-08-18 NOTE — Progress Notes (Signed)
Physical Therapy Treatment ?Patient Details ?Name: Sandra [name] ?MRN: [id number] ?DOB: 01-Nov-1960 ?Today's Date: 08/18/2021 ? ? ?History of Present Illness 61 yo female presents to [facility] on 2/22 with fall x3 weeks ago with progressive generalized weakness, fatigue, AMS. CXR shows L>R PNA, covid + requiring supplemental O2. xray shows L greater trochanter fx, nondisplaced. Pt with recent ED admission x2 weeks ago with colitis and L middle finger fx now splinted, d/c home after this. PMH includes R MCA CVA 2006, anxiety, OA, depression, HTN, HLD, CAD with MI, PFO, pre DM, tobacco abuse. ? ?  ?PT Comments  ? ? Pt verbalizes fear of falling, requires encouragement to improve anxiousness. Pt with difficulty maintaining TDWB status, encouraged NWB with STS since TDWB too difficult to understand. Pt able to take a few steps at bedside and pivot to recliner with mod A+2 and constant WB status education. Pt tolerates RLE strengthening exercises without pain complaints. Pt on RA with SpO2 >92% throughout session. Pt tolerates remaining up in recliner at EOS with all needs in reach. ?   ?Recommendations for follow up therapy are one component of a multi-disciplinary discharge planning process, led by the attending physician.  Recommendations may be updated based on patient status, additional functional criteria and insurance authorization. ? ?Follow Up Recommendations ? Skilled nursing-short term rehab (<3 hours/day) ?  ?  ?Assistance Recommended at Discharge Frequent or constant Supervision/Assistance  ?Patient can return home with the following Two people to help with walking and/or transfers;A lot of help with bathing/dressing/bathroom;Assistance with cooking/housework;Assist for transportation ?  ?Equipment Recommendations ? None recommended by PT  ?  ?Recommendations for Other Services   ? ? ?  ?Precautions / Restrictions Precautions ?Precautions: Fall ?Precaution Comments: Left hemiparesis, left middle finger splint and wrist  contracture ?Restrictions ?Weight Bearing Restrictions: Yes ?LLE Weight Bearing: Touchdown weight bearing  ?  ? ?Mobility ? Bed Mobility ? General bed mobility comments: sitting EOB upon arrival ?  ? ?Transfers ?Overall transfer level: Needs assistance ?Equipment used: Bilateral platform walker (EVA walker) ?Transfers: Sit to/from Stand, Bed to chair/wheelchair/BSC ?Sit to Stand: Mod assist, +2 physical assistance ?Stand pivot transfers: Mod assist, +2 physical assistance, +2 safety/equipment ? General transfer comment: mod A+2 to power to stand, heavy VC for TDWB status with difficulty maintaining so encouraged pt to perform with NWB to ensure, increased time with pivoting to recliner, heavy cues to push through BUE on EVA walker and maintain WB status ?  ? ?Ambulation/Gait ?Ambulation/Gait assistance: Mod assist, +2 physical assistance, +2 safety/equipment ? Gait velocity: decreased ? General Gait Details: pt able to take a few steps forward and lateral with EVA walker, attempts to rest L foot on R foot, continued education on TDWB requiring constant cues to maintain, recliner placed behind pt to sit ? ? ?Stairs ?  ?  ?  ?  ?  ? ? ?Wheelchair Mobility ?  ? ?Modified Rankin (Stroke Patients Only) ?  ? ? ?  ?Balance Overall balance assessment: Needs assistance ?Sitting-balance support: No upper extremity supported, Feet supported ?Sitting balance-Leahy Scale: Good ? Standing balance support: During functional activity, Reliant on assistive device for balance, Bilateral upper extremity supported ?Standing balance-Leahy Scale: Poor ?  ?  ?Cognition Arousal/Alertness: Awake/alert ?Behavior During Therapy: Anxious ?Overall Cognitive Status: Within Functional Limits for tasks assessed ?Area of Impairment: Safety/judgement, Problem solving ? Safety/Judgement: Decreased awareness of deficits ? Problem Solving: Difficulty sequencing, Requires verbal cues, Requires tactile cues ?General Comments: pt reports "I'm terrified to  fall again",  difficulty maintaining WB status requiring max cues with functional mobility ?  ?  ? ?  ?Exercises General Exercises - Lower Extremity ?Ankle Circles/Pumps: Seated, AROM, Both, 10 reps ?Quad Sets: Seated, AROM, Strengthening, Right, 10 reps ?Gluteal Sets: Seated, Strengthening, Both, 10 reps ?Short Arc Quad: Seated, AROM, Strengthening, Right, 10 reps ?Long Arc Quad: Seated, AROM, Strengthening, Right, 10 reps ? ?  ?General Comments General comments (skin integrity, edema, etc.): Pt on RA with SpO2 96-98% ?  ?  ? ?Pertinent Vitals/Pain Pain Assessment ?Pain Assessment: 0-10 ?Pain Score: 8  ?Pain Location: Left hip ?Pain Descriptors / Indicators: Grimacing ?Pain Intervention(s): Limited activity within patient's tolerance, Monitored during session, Premedicated before session, Repositioned  ? ? ?Home Living   ?  ?  ?  ?  ?  ?  ?  ?  ?  ?   ?  ?Prior Function    ?  ?  ?   ? ?PT Goals (current goals can now be found in the care plan section) Acute Rehab PT Goals ?Patient Stated Goal: get home to my dogs ?PT Goal Formulation: With patient ?Time For Goal Achievement: 08/27/21 ?Potential to Achieve Goals: Fair ?Progress towards PT goals: Progressing toward goals (very slowly) ? ?  ?Frequency ? ? ? Min 2X/week ? ? ? ?  ?PT Plan Current plan remains appropriate  ? ? ?Co-evaluation PT/OT/SLP Co-Evaluation/Treatment: Yes ?Reason for Co-Treatment: For patient/therapist safety;To address functional/ADL transfers ?PT goals addressed during session: Mobility/safety with mobility ?OT goals addressed during session: ADL's and self-care ?  ? ?  ?AM-PAC PT "6 Clicks" Mobility   ?Outcome Measure ? Help needed turning from your back to your side while in a flat bed without using bedrails?: A Little ?Help needed moving from lying on your back to sitting on the side of a flat bed without using bedrails?: A Little ?Help needed moving to and from a bed to a chair (including a wheelchair)?: A Lot ?Help needed standing up from a  chair using your arms (e.g., wheelchair or bedside chair)?: A Lot ?Help needed to walk in hospital room?: Total ?Help needed climbing 3-5 steps with a railing? : Total ?6 Click Score: 12 ? ?  ?End of Session Equipment Utilized During Treatment: Gait belt ?Activity Tolerance: Patient tolerated treatment well;Patient limited by pain ?Patient left: in chair;with call bell/phone within reach;with chair alarm set ?Nurse Communication: Mobility status ?PT Visit Diagnosis: Other abnormalities of gait and mobility (R26.89);Muscle weakness (generalized) (M62.81) ?  ? ? ?Time: 7680-8811 ?PT Time Calculation (min) (ACUTE ONLY): 30 min ? ?Charges:  $Therapeutic Exercise: 8-22 mins          ?          ? ? ?Talbot Grumbling PT, DPT ?08/18/21, 12:39 PM ? ? ? ? ? ? ?

## 2021-08-18 NOTE — Progress Notes (Signed)
?PROGRESS NOTE ? ? ?Sandra [name]  MEQ:[id number] DOB: 04-19-1961 DOA: 08/11/2021 ?PCP: Guadlupe Spanish, MD  ?Brief Narrative:  ?61 year old white female [baseline uses Walker, has AFO] ?CAD + MI and stent 2007 ([place] [facility] Dr. Humphrey Rolls), MCA stroke 2012 ?MRSA endophthalmitis in June 2015--3rd nerve palsy and R eye blindness? ?Tobacco abuse ongoing ?Depression ?Prior kyphoplasty/L1 compression fracture intervention? ? ?Fall~ 4 weeks ago and per sister there has been progressive decline since then with her mentation. ?Sister recently tested positive for COVID  ?Patient also reports having pain in her left hip with minimal movement. ?has a brace for ankle introversion, that she is not wearing. ?sustained left middle finger fracture which was splinted in the ER on 2/8 and patient was referred to orthopedics but has not seen them since discharge ? ?Hospital-Problem based course ? ?Pneumonia secondary to COVID-19 acute hypoxic respiratory failure on admission secondary to viral pneumonia ?Much improved-previously was 80% on room air ?Last day of isolation 08/20/2021-awaiting skilled placement ?Received standard remdesivir and steroids ?Asymptomatic currently on room air-symptomatically administer Mucinex, Combivent, Tessalon ?Prior L1 compression fracture ?Recent fall with nondisplaced left hip fracture, closed fracture of left middle finger ?Will need skilled care-we will [facility] on-call provider to ensure not lost to follow-up ?Discussed with therapy personally-she is able to straight leg raise but has significant weightbearing failure and does not touchdown weight-bear with them-this can be worked on as an outpatient ?Pain is controlled on current Norco ?Metabolic encephalopathy on admission ?Resolved-secondary to prior to likely polypharmacy  ?Prior CAD [facility] ?Continue goal-directed Plavix 75, Crestor 40 ?Resume low-dose Aldactone 25 ?Resume on discharge all losartan 100  metoprolol 100 twice daily ?Prior endophthalmitis from MRSA ?Continue all eyedrops atropine prednisolone ETEC ?Depression ?Prior to admission was on Zoloft and this was resumed at 100 mg ?Holding Vistaril 100 at bedtime secondary to beers criteria med ?Seroquel from prior to admission has been held ?Neuropathy NOS ?Gabapentin 300 3 times daily held ??  Ulcerative colitis ?No warning symptoms concerns-outpatient reeval ? ? ?DVT prophylaxis: Lovenox ?Code Status: Full ?Family Communication: None ?Disposition:  ?Status is: Inpatient ?Remains inpatient appropriate because:  ? ?Needs placement and unable to place today ?  ?Consultants:  ? ? ?Procedures:  ? ?Antimicrobials:   ? ? ?Subjective: ?Awake coherent had some pain getting up from the bed to the chair-needed max assist according to therapy-unable to bear weight on left leg-does not have the AFO that she was wearing previously-had been referred apparently by podiatry to physical therapy in the outpatient setting for this exact reason in terms of inability to mobilize secondary to her numerous orthopedic issues ? ?Objective: ?Vitals:  ? 08/18/21 0459 08/18/21 0938 08/18/21 1000 08/18/21 1100  ?BP: (!) 152/83 124/79    ?Pulse: 72 75    ?Resp: 20 (!) 22 20 16   ?Temp: 98.6 ?F (37 ?C) 98.2 ?F (36.8 ?C)    ?TempSrc: Oral Oral    ?SpO2: 95% 95%    ?Weight:      ?Height:      ? ? ?Intake/Output Summary (Last 24 hours) at 08/18/2021 1410 ?Last data filed at 08/18/2021 0940 ?Gross per 24 hour  ?Intake --  ?Output 1200 ml  ?Net -1200 ml  ? ?Filed Weights  ? 08/11/21 1456  ?Weight: 78 kg  ? ? ?Examination: ? ?Awake coherent flat affect postoperative changes to right eye-poor visual reflex ?Chest clear no rales rhonchi ?S1-S2 no murmur sinus rhythm ?ROM intact no focal deficit ?She  is able to straight leg raise but has some pain ?She is able to externally rotate at the hip ?No lower extremity edema ?Her left middle index finger is splinted ? ?Data Reviewed: personally reviewed   ? ?CBC ?   ?Component Value Date/Time  ? WBC 7.7 08/16/2021 0343  ? RBC 3.84 (L) 08/16/2021 0343  ? HGB 11.4 (L) 08/16/2021 0343  ? HCT 34.5 (L) 08/16/2021 0343  ? PLT 216 08/16/2021 0343  ? MCV 89.8 08/16/2021 0343  ? MCH 29.7 08/16/2021 0343  ? MCHC 33.0 08/16/2021 0343  ? RDW 13.4 08/16/2021 0343  ? LYMPHSABS 1.4 08/16/2021 0343  ? MONOABS 0.5 08/16/2021 0343  ? EOSABS 0.0 08/16/2021 0343  ? BASOSABS 0.0 08/16/2021 0343  ? ?CMP Latest Ref Rng & Units 08/16/2021 08/15/2021 08/14/2021  ?Glucose 70 - 99 mg/dL 195(H) 169(H) 216(H)  ?BUN 6 - 20 mg/dL 21(H) 24(H) 26(H)  ?Creatinine 0.44 - 1.00 mg/dL 0.45 0.41(L) 0.52  ?Sodium 135 - 145 mmol/L 135 136 133(L)  ?Potassium 3.5 - 5.1 mmol/L 4.3 3.7 4.1  ?Chloride 98 - 111 mmol/L 105 103 100  ?CO2 22 - 32 mmol/L 25 26 25   ?Calcium 8.9 - 10.3 mg/dL 8.2(L) 8.1(L) 8.1(L)  ?Total Protein 6.5 - 8.1 g/dL 5.2(L) 5.4(L) 5.6(L)  ?Total Bilirubin 0.3 - 1.2 mg/dL 0.2(L) 0.2(L) 0.3  ?Alkaline Phos 38 - 126 U/L 77 87 89  ?AST 15 - 41 U/L 13(L) 14(L) 10(L)  ?ALT 0 - 44 U/L 15 13 12   ? ? ? ?Radiology Studies: ?No results found. ? ? ?Scheduled Meds: ? (feeding supplement) PROSource Plus  30 mL Oral BID BM  ? allopurinol  300 mg Oral Daily  ? vitamin C  500 mg Oral Daily  ? atropine  1 drop Right Eye Daily  ? benzonatate  100 mg Oral TID  ? clopidogrel  75 mg Oral Daily  ? dextromethorphan-guaiFENesin  1 tablet Oral BID  ? doxycycline  100 mg Oral Q12H  ? enoxaparin (LOVENOX) injection  40 mg Subcutaneous Q24H  ? feeding supplement  237 mL Oral BID BM  ? Ipratropium-Albuterol  1 puff Inhalation TID  ? multivitamin with minerals  1 tablet Oral Daily  ? nutrition supplement (JUVEN)  1 packet Oral BID BM  ? nystatin  5 mL Oral QID  ? prednisoLONE acetate  1 drop Right Eye BID  ? predniSONE  50 mg Oral Q breakfast  ? rosuvastatin  40 mg Oral QHS  ? sertraline  100 mg Oral QHS  ? zinc sulfate  220 mg Oral Daily  ? ?Continuous Infusions: ? ? LOS: 7 days  ? ?Time spent: 37 ? ?Nita Sells,  MD ?[facility] ?To contact the attending provider between 7A-7P or the covering provider during after hours 7P-7A, please log into the web site [email or web address] and access using universal [place] password for that web site. If you do not have the password, please call the hospital operator. ? ?08/18/2021, 2:10 PM  ? ? ?

## 2021-08-19 LAB — CBC WITH DIFFERENTIAL/PLATELET
Abs Immature Granulocytes: 0.18 10*3/uL — ABNORMAL HIGH (ref 0.00–0.07)
Basophils Absolute: 0 10*3/uL (ref 0.0–0.1)
Basophils Relative: 0 %
Eosinophils Absolute: 0 10*3/uL (ref 0.0–0.5)
Eosinophils Relative: 0 %
HCT: 38.1 % (ref 36.0–46.0)
Hemoglobin: 12.2 g/dL (ref 12.0–15.0)
Immature Granulocytes: 2 %
Lymphocytes Relative: 27 %
Lymphs Abs: 2.2 10*3/uL (ref 0.7–4.0)
MCH: 29.3 pg (ref 26.0–34.0)
MCHC: 32 g/dL (ref 30.0–36.0)
MCV: 91.4 fL (ref 80.0–100.0)
Monocytes Absolute: 0.6 10*3/uL (ref 0.1–1.0)
Monocytes Relative: 8 %
Neutro Abs: 5.2 10*3/uL (ref 1.7–7.7)
Neutrophils Relative %: 63 %
Platelets: 262 10*3/uL (ref 150–400)
RBC: 4.17 MIL/uL (ref 3.87–5.11)
RDW: 13.9 % (ref 11.5–15.5)
WBC: 8.1 10*3/uL (ref 4.0–10.5)
nRBC: 0 % (ref 0.0–0.2)

## 2021-08-19 LAB — COMPREHENSIVE METABOLIC PANEL
ALT: 34 U/L (ref 0–44)
AST: 20 U/L (ref 15–41)
Albumin: 2.5 g/dL — ABNORMAL LOW (ref 3.5–5.0)
Alkaline Phosphatase: 86 U/L (ref 38–126)
Anion gap: 7 (ref 5–15)
BUN: 25 mg/dL — ABNORMAL HIGH (ref 6–20)
CO2: 25 mmol/L (ref 22–32)
Calcium: 8.4 mg/dL — ABNORMAL LOW (ref 8.9–10.3)
Chloride: 102 mmol/L (ref 98–111)
Creatinine, Ser: 0.7 mg/dL (ref 0.44–1.00)
GFR, Estimated: >60 mL/min (ref >60)
Glucose, Bld: 150 mg/dL — ABNORMAL HIGH (ref 70–99)
Potassium: 4.3 mmol/L (ref 3.5–5.1)
Sodium: 134 mmol/L — ABNORMAL LOW (ref 135–145)
Total Bilirubin: 0.3 mg/dL (ref 0.3–1.2)
Total Protein: 5.5 g/dL — ABNORMAL LOW (ref 6.5–8.1)

## 2021-08-19 MED ORDER — IPRATROPIUM-ALBUTEROL 20-100 MCG/ACT IN AERS: 1.0000 | INHALATION_SPRAY | Freq: Four times a day (QID) | RESPIRATORY_TRACT | Status: DC | PRN

## 2021-08-19 NOTE — Plan of Care (Signed)
  Problem: Coping: Goal: Psychosocial and spiritual needs will be supported Outcome: Progressing   Problem: Respiratory: Goal: Will maintain a patent airway Outcome: Progressing   

## 2021-08-19 NOTE — TOC Progression Note (Addendum)
Transition of Care (TOC) - Progression Note  ? ? ?Patient Details  ?Name: Sandra [name] ?MRN: [id number] ?Date of Birth: 1961/04/17 ? ?Transition of Care (TOC) CM/SW Contact  ?Purcell Mouton, RN ?Phone Number: ?08/19/2021, 3:58 PM ? ?Clinical Narrative:    ? ?Spoke with pt's sister concerning SNF. Pt was faxed to several SNF's with only one bed offer [facility]. Sister accepted bed offer. [place] [facility] will start insurance authorization for SNF.  Waiting on insurance authorization.  ? ?  ?  ? ?Expected Discharge Plan and Services ?  ?  ?  ?  ?  ?                ?  ?  ?  ?  ?  ?  ?  ?  ?  ?  ? ? ?Social Determinants of Health (SDOH) Interventions ?  ? ?Readmission Risk Interventions ?No flowsheet data found. ? ?

## 2021-08-19 NOTE — Progress Notes (Signed)
?PROGRESS NOTE ? ? ?Sandra [name]  ZLD:[id number] DOB: 1960-10-20 DOA: 08/11/2021 ?PCP: Guadlupe Spanish, MD  ?Brief Narrative:  ?61 year old white female [baseline uses Walker, has AFO] ?CAD + MI and stent 2007 ([place] [facility] Dr. Humphrey Rolls), MCA stroke 2012 ?MRSA endophthalmitis in June 2015--3rd nerve palsy and R eye blindness? ?Tobacco abuse ongoing ?Depression ?Prior kyphoplasty/L1 compression fracture intervention? ? ?Fall~ 4 weeks ago and per sister there has been progressive decline since then with her mentation. ?Sister recently tested positive for COVID  ?Patient also reports having pain in her left hip with minimal movement. ?has a brace for ankle inversion, that she is not wearing. ?sustained left middle finger fracture which was splinted in the ER on 2/8 and patient was referred to orthopedics but has not seen them since discharge ? ?Hospital-Problem based course ? ?Pneumonia secondary to COVID-19 acute hypoxic respiratory failure on admission secondary to viral pneumonia ?improved-previously was 80% on room air ?Last day of isolation 08/20/2021-awaiting skilled placement ?Received standard remdesivir and steroids ?Asymptomatic currently on room air-symptomatically administer Mucinex, Combivent, Tessalon ?Prior L1 compression fracture ?Recent fall with nondisplaced left hip fracture, closed fracture of left middle finger ?needs skilled care-we will [facility] on-call provider to ensure not lost to follow-up ?Per therapy-able to straight leg raise but has significant weightbearing failure ?Pain is controlled on current Norco ?Metabolic encephalopathy on admission ?Resolved-secondary to prior to likely polypharmacy  ?Prior CAD [facility] ?Continue goal-directed Plavix 75, Crestor 40 ?Resume low-dose Aldactone 25 ?Resume losartan 100 metoprolol 100 twice daily as OP ?Prior endophthalmitis from MRSA ?Continue all eyedrops atropine prednisolone ETEC ?Depression ?PTA was on Zoloft  and this was resumed at 100 mg ?Holding Vistaril 100 at bedtime secondary to beers criteria med ?Seroquel has been held ?Neuropathy NOS ?Gabapentin 300 3 times daily held ??  Ulcerative colitis ?No warning symptoms concerns-outpatient reeval ? ? ?DVT prophylaxis: Lovenox ?Code Status: Full ?Family Communication: None ?Disposition:  ?Status is: Inpatient ?Remains inpatient appropriate because:  ? ?Needs placement and unable to place today as has Covid and needs 10 days isolation per TOC ?  ? ?Subjective: ? ?Emotional "I miss my dogs"  "where am I going"  ?No cp ? ?Objective: ?Vitals:  ? 08/18/21 1300 08/18/21 1519 08/18/21 2115 08/19/21 0604  ?BP:  128/69 132/80 (!) 152/74  ?Pulse:  80 98 64  ?Resp: 16 17 (!) 24 17  ?Temp:  98.3 ?F (36.8 ?C) 98.6 ?F (37 ?C) 97.8 ?F (36.6 ?C)  ?TempSrc:  Oral Oral Oral  ?SpO2:  95% 94% 94%  ?Weight:      ?Height:      ? ? ?Intake/Output Summary (Last 24 hours) at 08/19/2021 1420 ?Last data filed at 08/19/2021 9030 ?Gross per 24 hour  ?Intake 120 ml  ?Output 700 ml  ?Net -580 ml  ? ? ?Filed Weights  ? 08/11/21 1456  ?Weight: 78 kg  ? ? ?Examination: ? ?Awake coherent flat affect postoperative changes to right eye-poor visual reflex ?Chest clear no rales rhonchi ?S1-S2 no murmur sinus rhythm ?ROM intact no focal deficit ?She is able to straight leg raise but has some pain ?No lower extremity edema ?Her left middle index finger is splinted ? ?Data Reviewed: personally reviewed  ? ?CBC ?   ?Component Value Date/Time  ? WBC 8.1 08/19/2021 0407  ? RBC 4.17 08/19/2021 0407  ? HGB 12.2 08/19/2021 0407  ? HCT 38.1 08/19/2021 0407  ? PLT 262 08/19/2021 0407  ? MCV 91.4 08/19/2021  0407  ? MCH 29.3 08/19/2021 0407  ? MCHC 32.0 08/19/2021 0407  ? RDW 13.9 08/19/2021 0407  ? LYMPHSABS 2.2 08/19/2021 0407  ? MONOABS 0.6 08/19/2021 0407  ? EOSABS 0.0 08/19/2021 0407  ? BASOSABS 0.0 08/19/2021 0407  ? ?CMP Latest Ref Rng & Units 08/19/2021 08/16/2021 08/15/2021  ?Glucose 70 - 99 mg/dL 150(H) 195(H) 169(H)   ?BUN 6 - 20 mg/dL 25(H) 21(H) 24(H)  ?Creatinine 0.44 - 1.00 mg/dL 0.70 0.45 0.41(L)  ?Sodium 135 - 145 mmol/L 134(L) 135 136  ?Potassium 3.5 - 5.1 mmol/L 4.3 4.3 3.7  ?Chloride 98 - 111 mmol/L 102 105 103  ?CO2 22 - 32 mmol/L 25 25 26   ?Calcium 8.9 - 10.3 mg/dL 8.4(L) 8.2(L) 8.1(L)  ?Total Protein 6.5 - 8.1 g/dL 5.5(L) 5.2(L) 5.4(L)  ?Total Bilirubin 0.3 - 1.2 mg/dL 0.3 0.2(L) 0.2(L)  ?Alkaline Phos 38 - 126 U/L 86 77 87  ?AST 15 - 41 U/L 20 13(L) 14(L)  ?ALT 0 - 44 U/L 34 15 13  ? ? ? ?Radiology Studies: ?No results found. ? ? ?Scheduled Meds: ? (feeding supplement) PROSource Plus  30 mL Oral BID BM  ? allopurinol  300 mg Oral Daily  ? vitamin C  500 mg Oral Daily  ? atropine  1 drop Right Eye Daily  ? benzonatate  100 mg Oral TID  ? clopidogrel  75 mg Oral Daily  ? dextromethorphan-guaiFENesin  1 tablet Oral BID  ? enoxaparin (LOVENOX) injection  40 mg Subcutaneous Q24H  ? feeding supplement  237 mL Oral BID BM  ? Ipratropium-Albuterol  1 puff Inhalation TID  ? multivitamin with minerals  1 tablet Oral Daily  ? nutrition supplement (JUVEN)  1 packet Oral BID BM  ? nystatin  5 mL Oral QID  ? prednisoLONE acetate  1 drop Right Eye BID  ? predniSONE  50 mg Oral Q breakfast  ? rosuvastatin  40 mg Oral QHS  ? sertraline  100 mg Oral QHS  ? spironolactone  25 mg Oral Daily  ? zinc sulfate  220 mg Oral Daily  ? ?Continuous Infusions: ? ? LOS: 8 days  ? ?Time spent: 48 ? ?Nita Sells, MD ?[facility] ?To contact the attending provider between 7A-7P or the covering provider during after hours 7P-7A, please log into the web site [email or web address] and access using universal [place] password for that web site. If you do not have the password, please call the hospital operator. ? ?08/19/2021, 2:20 PM  ? ? ?

## 2021-08-19 NOTE — Progress Notes (Signed)
Nutrition Follow-up ?RD working remotely. ? ? ?DOCUMENTATION CODES:  ? ?Not applicable ? ?INTERVENTION:  ?- continue Ensure BID, Juven BID, and Prosource Plus BID. ?- weigh patient on 3/3. ? ? ?NUTRITION DIAGNOSIS:  ? ?Increased nutrient needs related to acute illness, catabolic illness (ZHGDJ-24 infection) as evidenced by estimated needs. -ongoing ? ?GOAL:  ? ?Patient will meet greater than or equal to 90% of their needs -met on average with meals and ONS ? ?MONITOR:  ? ?PO intake, Supplement acceptance, Labs, Weight trends ? ?ASSESSMENT:  ? ?61 y.o. female with medical history of CVA, CAD, HTN, depression, HLD, PFO, ulcerative colitis, active smoker. She presented to the ED due to progressively worsening lethargy, fatigue, L hip pain, and muscle cramps. She was seen in the ED on 07/28/21 due to diarrhea and reports this presentation that she has had diarrhea x3 weeks; reports 4-5 episodes/day. Family reported that patient has been having visual hallucinations. She uses a walker for ambulation. She was admitted with dx of PNA due to COVID-19. ? ?She has mainly been eating 50-75% at meals over the past 1 week; most recently documented intake was 100% of dinner tonight.  ? ?She has been accepting all oral nutrition supplements 90-100% of the time offered. ? ?Patient has not been weighed since admission on 2/22. Non-pitting edema to BLE throughout hospitalization. She is noted to be -1.5 L since admission. ? ?TOC note today indicates patient was offered bed at [facility] and insurance Josem Kaufmann has been initiated.  ? ?Patient remains on COVID precautions.  ? ? ?Labs reviewed; Na: 134 mg/dl, BUN: 25 mg/dl, Ca: 8.4 mg/dl. ? ?Medications reviewed; 500 mg ascorbic acid/day, 1 tablet multivitamin with minerals/day, 5 ml mycostatin QID, 50 mg deltasone/day, 25 mg aldactone/day, 220 mg zinc sulfate/day.  ? ? ?Diet Order:   ?Diet Order   ? ?       ?  Diet 2 gram sodium Room service appropriate? Yes; Fluid consistency: Thin  Diet  effective now       ?  ? ?  ?  ? ?  ? ? ?EDUCATION NEEDS:  ? ?No education needs have been identified at this time ? ?Skin:  Skin Assessment: Skin Integrity Issues: ?Skin Integrity Issues:: Stage III ?Stage III: sacrum ? ?Last BM:  3/2 (type 5 x1, small amount) ? ?Height:  ? ?Ht Readings from Last 1 Encounters:  ?08/11/21 5' 5"  (1.651 m)  ? ? ?Weight:  ? ?Wt Readings from Last 1 Encounters:  ?08/11/21 78 kg  ? ? ?BMI:  Body mass index is 28.62 kg/m?. ? ?Estimated Nutritional Needs:  ?Kcal:  1900-2100 kcal ?Protein:  90-105 grams ?Fluid:  >/= 2.2 L/day ? ? ? ? ?Jarome Matin, MS, RD, LDN ?Inpatient Clinical Dietitian ?RD pager # available in [place]  ?After hours/weekend pager # available in [place] ? ?

## 2021-08-19 NOTE — Progress Notes (Signed)
Nurse assisted pt with transfer from bed to chair. Witnessed pt scratching lower back, she stated that it was itchy and sore. Nurse noticed scant amount of blood on bed pad. Measured area on rt side of buttock 0.3x 0.3.  ?Placed gauze and tape over area. Notified night nurse of area. ?Jerene Pitch ? ?

## 2021-08-20 MED ORDER — HYDROCODONE-ACETAMINOPHEN 5-325 MG PO TABS: 1.0000 | ORAL_TABLET | ORAL | 0 refills | Status: AC | PRN

## 2021-08-20 NOTE — TOC Progression Note (Signed)
Transition of Care (TOC) - Progression Note  ? ? ?Patient Details  ?Name: Sandra [name] ?MRN: [id number] ?Date of Birth: January 04, 1961 ? ?Transition of Care (TOC) CM/SW Contact  ?Purcell Mouton, RN ?Phone Number: ?08/20/2021, 1:17 PM ? ?Clinical Narrative:    ? ?Insurance Josem Kaufmann was given to SNF. PTAR ws called Pt, RN and pt's sister aware.  ? ?  ?  ? ?Expected Discharge Plan and Services ?  ?  ?  ?  ?  ?Expected Discharge Date: 08/20/21               ?  ?  ?  ?  ?  ?  ?  ?  ?  ?  ? ? ?Social Determinants of Health (SDOH) Interventions ?  ? ?Readmission Risk Interventions ?No flowsheet data found. ? ?

## 2021-08-20 NOTE — TOC Progression Note (Signed)
Transition of Care (TOC) - Progression Note  ? ? ?Patient Details  ?Name: Sandra [name] ?MRN: [id number] ?Date of Birth: February 24, 1961 ? ?Transition of Care (TOC) CM/SW Contact  ?Purcell Mouton, RN ?Phone Number: ?08/20/2021, 10:24 AM ? ?Clinical Narrative:    ?Insurance and SNF request PT/OT notes. Faxed to SNF.  ? ? ?  ?  ? ?Expected Discharge Plan and Services ?  ?  ?  ?  ?  ?Expected Discharge Date: 08/20/21               ?  ?  ?  ?  ?  ?  ?  ?  ?  ?  ? ? ?Social Determinants of Health (SDOH) Interventions ?  ? ?Readmission Risk Interventions ?No flowsheet data found. ? ?

## 2021-08-20 NOTE — Progress Notes (Signed)
Chaplain received a referral to see Sandra [name] due to the recent loss of her son.  Chaplain engaged Sandra [name] in a conversation by phone (due to precautions).   ? ?Chaplain provided listening as Sandra [name] shared about the loss of her son over this past summer and about the difficult health journey that she has been on.  She has the support of her [name] and of her son's partner, but otherwise feels very alone.  Sandra [name] has cancer and Sandra [name] does not want to be a burden on her, nor does she want to get used to too much support from her.  Sandra [name] also cares for their brother who had a stroke.  The oldest [name] died and this has also been very difficult for them. Sandra [name] requested prayer, which chaplain provided. ? ?If Sandra [name] needs more support before she is able to discharge, please page [place] at [phone]. ? ?[employer], Bcc ?Pager, [phone] ?

## 2021-08-20 NOTE — TOC Progression Note (Signed)
Transition of Care (TOC) - Progression Note  ? ? ?Patient Details  ?Name: Sandra [name] ?MRN: [id number] ?Date of Birth: 1960/09/12 ? ?Transition of Care (TOC) CM/SW Contact  ?Purcell Mouton, RN ?Phone Number: ?08/20/2021, 10:08 AM ? ?Clinical Narrative:    ?No Insurance authorization at present time for pt to go to SNF.  ? ? ?  ?  ? ?Expected Discharge Plan and Services ?  ?  ?  ?  ?  ?Expected Discharge Date: 08/20/21               ?  ?  ?  ?  ?  ?  ?  ?  ?  ?  ? ? ?Social Determinants of Health (SDOH) Interventions ?  ? ?Readmission Risk Interventions ?No flowsheet data found. ? ?

## 2021-08-20 NOTE — Discharge Summary (Signed)
[facility]  Physician Discharge Summary   Patient: Sandra [name] MRN: [id number] DOB: [birth year]  Admit date:     08/11/2021  Discharge date: 08/20/21  Discharge Physician: Nita Sells   PCP: Guadlupe Spanish, MD   Recommendations at discharge:  Outpatient coordination with orthopedics has been performed and patient will see Dr. Lorin Mercy for elective hip repair We will need coordination of long-term bereavement counseling secondary to loss of son in 2022-please coordinate this with her pain/primary care physician Dr. Binnie Kand as an outpatient Get Chem-12 CBC 1 week Okay to weight-bear touchdown weightbearing left lower extremity-Ensure that patient has AFO for foot drop on left foot  Discharge Diagnoses: Principal Problem:   Pneumonia due to COVID-19 virus Active Problems:   CAD (coronary artery disease)   Essential hypertension   Hyperlipidemia   Depression   H/O: CVA (cerebrovascular accident)   Reported ulcerative colitis ([place])   Hypokalemia   Morbid obesity ([place])   Phthisis bulbi of right eye   Unwitnessed fall   Closed left hip fracture, sequela   Closed fracture of phalanx of left middle finger   Acute metabolic encephalopathy   Hallucination   Acute respiratory failure with hypoxia (HCC)   Cellulitis of right buttock   Pressure ulcer   Oral thrush  Resolved Problems:   * No resolved hospital problems. *   Hospital Course:  61 year old white female [baseline uses [facility], has AFO] CAD + MI and stent 2007 ([facility] Dr. Humphrey Rolls), MCA stroke 2012 MRSA endophthalmitis in June 2015--3rd nerve palsy and R eye blindness? Tobacco abuse ongoing Depression Prior kyphoplasty/L1 compression fracture intervention?   Fall~ 4 weeks ago and per sister there has been progressive decline since then with her mentation. Sister recently tested positive for COVID  Patient also reports having pain in her left hip with minimal movement. has a brace for ankle inversion, that she is  not wearing. sustained left middle finger fracture which was splinted in the ER on 2/8 and patient was referred to orthopedics but has not seen them since discharge    Assessment and Plan:  Pneumonia secondary to COVID-19 acute hypoxic respiratory failure on admission secondary to viral pneumonia improved-previously was 80% on room air Last day of isolation 08/20/2021-awaiting skilled placement Received standard remdesivir and steroids Asymptomatic currently on room air-symptomatically administer Mucinex, Combivent, Tessalon Prior L1 compression fracture Recent fall with nondisplaced left hip fracture, closed fracture of left middle finger needs skilled care- [facility] Dr. Lorin Mercy is aware and patient should follow-up for outpatient evaluation of multiple fractures Per therapy-able to straight leg raise but has significant weightbearing failure AFO needs to be placed on left lower extremity-- Pain is controlled on current Norco and she will continue this Chronic pain  Patient follows with Dr. [facility] for chronic pain from sciatic issues on the left side-coincidently this is where she has a nondisplaced femoral fracture-she will need to obtain refills from 10 post SNF discharge metabolic encephalopathy on admission Resolved-secondary to prior to likely polypharmacy  Prior CAD followed [facility]. [facility] Continue goal-directed Plavix 75, Crestor 40 Resume low-dose Aldactone 25 held losartan 100 metoprolol 100 twice daily on discharge Prior endophthalmitis from MRSA Continue all eyedrops atropine prednisolone ETEC Depression PTA was on Zoloft and this was resumed at 100 mg Holding Vistaril 100 at bedtime secondary to beers criteria med Seroquel has been held Neuropathy NOS Gabapentin 300 3 times daily held on discharge ?  Ulcerative colitis No warning symptoms concerns-outpatient reeval  Consultants: n Procedures performed: n  Disposition: Skilled  nursing facility Diet recommendation:  Discharge Diet Orders (From admission, onward)     Start     Ordered   08/20/21 0000  Diet - low sodium heart healthy        08/20/21 0949           Cardiac and Carb modified diet  DISCHARGE MEDICATION: Allergies as of 08/20/2021       Reactions   Azor [amlodipine-olmesartan] Other (See Comments)   Drops blood pressure too low (takes plain amlodipine at home July 2015)   Trintellix [vortioxetine] Itching        Medication List     STOP taking these medications    amLODipine 10 MG tablet Commonly known as: NORVASC   gabapentin 300 MG capsule Commonly known as: NEURONTIN   hydrOXYzine 100 MG capsule Commonly known as: VISTARIL   loperamide 2 MG capsule Commonly known as: IMODIUM   losartan 50 MG tablet Commonly known as: COZAAR   metoprolol tartrate 100 MG tablet Commonly known as: LOPRESSOR   potassium chloride 10 MEQ CR capsule Commonly known as: MICRO-K   QUEtiapine 50 MG tablet Commonly known as: SEROQUEL       TAKE these medications    acetaminophen 500 MG tablet Commonly known as: TYLENOL Take 500 mg by mouth every 6 (six) hours as needed for moderate pain.   allopurinol 300 MG tablet Commonly known as: ZYLOPRIM TAKE 1 TABLET BY MOUTH ONCE DAILY   atropine 1 % ophthalmic solution Place 1 drop into the right eye daily.   CALCIUM-VITAMIN D PO Take 1 tablet by mouth daily.   clopidogrel 75 MG tablet Commonly known as: PLAVIX Take 1 tablet (75 mg total) by mouth daily.   folic acid 1 MG tablet Commonly known as: FOLVITE Take 1 mg by mouth daily.   HYDROcodone-acetaminophen 5-325 MG tablet Commonly known as: NORCO/VICODIN Take 1 tablet by mouth every 4 (four) hours as needed for moderate pain or severe pain.   Metamucil Smooth Texture 58.6 % powder Generic drug: psyllium Take 1 packet by mouth daily.   prednisoLONE acetate 1 % ophthalmic suspension Commonly known as: PRED FORTE Place 1 drop  into the right eye 2 (two) times daily.   rosuvastatin 40 MG tablet Commonly known as: CRESTOR TAKE 1 TABLET BY MOUTH ONCE DAILY SCHEDULE  AN  APPOINTMENT  FOR  FUTURE  REFILLS What changed: See the new instructions.   sertraline 100 MG tablet Commonly known as: ZOLOFT Take 100 mg by mouth at bedtime.   spironolactone 25 MG tablet Commonly known as: ALDACTONE TAKE 1 TABLET BY MOUTH ONCE DAILY   VITAMIN B-12 IJ Inject 1 Dose as directed every 30 (thirty) days. Patient not sure of the dosage.               Discharge Care Instructions  (From admission, onward)           Start     Ordered   08/20/21 0000  Discharge wound care:       Comments: Per above   08/20/21 6222            Follow-up Information     Marybelle Killings, MD Follow up in 2 week(s).   Specialty: Orthopedic Surgery Contact information: [address] [place] [address] [phone]                 Discharge Exam: Danley Danker Weights   08/11/21 1456  Weight:  78 kg   Awake coherent emotional-relates loss of sound to drugs last year Did try to encourage her and have asked that a chaplain come and speak with her with regards to bereavement that is delayed and probably indolent I assured her that I think she will improve fairly well however she does have to work with therapy and does need repair of her hip  Otherwise she is stable compared to yesterday EOMI NCAT no focal deficit CTA B no added sound Postoperative changes to the right eye Abdomen soft no rebound no guarding Straight leg raise on left side not attempted secondary to pain Patient still has splint on left middle finger I do not appreciate any edema  Condition at discharge: fair  The results of significant diagnostics from this hospitalization (including imaging, microbiology, ancillary and laboratory) are listed below for reference.   Imaging Studies: DG Chest 2 View  Result Date: 08/11/2021 CLINICAL DATA:  Cough  EXAM: CHEST - 2 VIEW COMPARISON:  CT 01/28/2019, chest x-ray 06/29/2016 FINDINGS: Patchy diffuse airspace opacity in the left thorax with probable mild ground-glass opacity in the peripheral right upper lobe. Borderline cardiomegaly. No pleural effusion or pneumothorax. IMPRESSION: Findings suspicious for left greater than right pneumonia. Electronically Signed   By: Donavan Foil M.D.   On: 08/11/2021 16:44   DG Abd 1 View  Result Date: 08/11/2021 CLINICAL DATA:  Abdominal pain. EXAM: ABDOMEN - 1 VIEW COMPARISON:  CT 07/28/2021 FINDINGS: Normal bowel gas pattern. No bowel dilatation to suggest obstruction. Cholecystectomy clips in the right upper quadrant. No radiopaque calculi. IMPRESSION: Normal bowel gas pattern.  No radiographic explanation for pain. Electronically Signed   By: Keith Rake M.D.   On: 08/11/2021 19:35   CT HEAD WO CONTRAST (5MM)  Result Date: 08/11/2021 CLINICAL DATA:  possible sepsis. Per EMS for the last three weeks she's had a "decline in mobility, fatigue, defecating on herself, and hot to touch". This started to happen after her fall three weeks ago.Mental status change, unknown cause EXAM: CT HEAD WITHOUT CONTRAST TECHNIQUE: Contiguous axial images were obtained from the base of the skull through the vertex without intravenous contrast. RADIATION DOSE REDUCTION: This exam was performed according to the departmental dose-optimization program which includes automated exposure control, adjustment of the mA and/or kV according to patient size and/or use of iterative reconstruction technique. COMPARISON:  Head CT 07/28/2021 FINDINGS: Brain: Large chronic RIGHT MCA territory infarction again noted. No acute intracranial hemorrhage. No focal mass lesion. No CT evidence of acute infarction. No midline shift or mass effect. No hydrocephalus. Basilar cisterns are patent. Vascular: No hyperdense vessel or unexpected calcification. Skull: Normal. Negative for fracture or focal lesion.  Sinuses/Orbits: Fluid in the maxillary sinuses and ethmoid air cells. Calcification of the RIGHT globe Other: None. IMPRESSION: 1. Maxillary sinusitis 2. Large RIGHT MCA territory remote infarction. Electronically Signed   By: Suzy Bouchard M.D.   On: 08/11/2021 16:52   CT Head Wo Contrast  Result Date: 07/28/2021 CLINICAL DATA:  Trauma EXAM: CT HEAD WITHOUT CONTRAST TECHNIQUE: Contiguous axial images were obtained from the base of the skull through the vertex without intravenous contrast. RADIATION DOSE REDUCTION: This exam was performed according to the departmental dose-optimization program which includes automated exposure control, adjustment of the mA and/or kV according to patient size and/or use of iterative reconstruction technique. COMPARISON:  01/25/2021 FINDINGS: Brain: No acute intracranial findings are seen. There is large area of encephalomalacia in the right frontal and parietal lobes. There is smaller  old infarct in the right occipital lobe. There is ex vacuo dilation of right lateral ventricle. There is no shift of midline structures. No significant interval changes are noted. Vascular: Unremarkable. Skull: No fracture is seen in calvarium. Sinuses/Orbits: There is phthisis bulbi in right optic globe with coarse calcification. Other: None IMPRESSION: No acute intracranial findings are seen. There are old infarcts in the right frontal, parietal and occipital lobes with no significant interval change. Electronically Signed   By: Elmer Picker M.D.   On: 07/28/2021 17:37   CT ABDOMEN PELVIS W CONTRAST  Result Date: 07/28/2021 CLINICAL DATA:  Abdominal pain, acute, nonlocalized. Diarrhea for 6 days. History of ulcerative colitis, cholecystitis and cholecystectomy. EXAM: CT ABDOMEN AND PELVIS WITH CONTRAST TECHNIQUE: Multidetector CT imaging of the abdomen and pelvis was performed using the standard protocol following bolus administration of intravenous contrast. RADIATION DOSE REDUCTION:  This exam was performed according to the departmental dose-optimization program which includes automated exposure control, adjustment of the mA and/or kV according to patient size and/or use of iterative reconstruction technique. CONTRAST:  145m OMNIPAQUE IOHEXOL 300 MG/ML  SOLN COMPARISON:  Abdominopelvic CT 06/19/2018 FINDINGS: Lower chest: Clear lung bases. No significant pleural or pericardial effusion. Aortic and coronary artery atherosclerosis. Hepatobiliary: The liver is normal in density without suspicious focal abnormality. No biliary dilatation status post cholecystectomy. Pancreas: Unremarkable. No pancreatic ductal dilatation or surrounding inflammatory changes. Spleen: Normal in size without focal abnormality. Adrenals/Urinary Tract: Both adrenal glands appear normal. Small low-density renal lesions are noted, measuring up to 10 mm in the interpolar region of the left kidney. These have mildly enlarged from the previous study, although are likely cysts. No enhancing renal mass, urinary tract calculus or hydronephrosis demonstrated. The bladder appears unremarkable for its degree of distention. Stomach/Bowel: No enteric contrast administered. The stomach appears unremarkable for its degree of distension. No evidence of bowel wall thickening, distention or surrounding inflammatory change. The appendix appears normal. Liquid stool noted in the distal colon compatible with diarrheal state. Vascular/Lymphatic: There are no enlarged abdominal or pelvic lymph nodes. Aortic and branch vessel atherosclerosis without acute vascular findings. Reproductive: Heterogeneously enhancing fibroid in the right uterine body measuring 2.9 cm on image 70/2 is similar to the previous study. No adnexal mass. Probable pelvic floor laxity. Other: No evidence of abdominal wall mass or hernia. No ascites. Musculoskeletal: No acute or significant osseous findings. Chronic burst fracture at L1 with osseous retropulsion is  unchanged. There is an interval superior endplate compression deformity at L4 without acute features. Multilevel facet arthropathy contributes to a degenerative grade 1 anterolisthesis and mild foraminal narrowing at L4-5. IMPRESSION: 1. No definite acute findings or signs of active colitis. Liquid stool in the distal colon as can be seen in a diarrheal state of various etiologies. The appendix appears normal. 2. Pelvic floor laxity and uterine fibroid again noted. 3. Coronary and Aortic Atherosclerosis (ICD10-I70.0). Electronically Signed   By: WRichardean SaleM.D.   On: 07/28/2021 14:19   DG Finger Middle Left  Result Date: 08/11/2021 CLINICAL DATA:  Fracture. EXAM: LEFT MIDDLE FINGER 2+V COMPARISON:  Radiograph 07/28/2021 FINDINGS: Unchanged alignment of middle finger middle phalanx fracture. Fracture is displaced and comminuted, with settling of the middle phalanx on the proximal phalanx. Persistent ulnar displacement of the distal fracture fragment. There is no significant interval bony bridging. IMPRESSION: Unchanged alignment of displaced and comminuted middle finger middle phalanx fracture without significant interval healing from prior exam. Electronically Signed   By: MAurther LoftD.  On: 08/11/2021 19:37   DG Finger Middle Left  Result Date: 07/28/2021 CLINICAL DATA:  Status post reduction of an impacted and subluxed fracture of the left 3rd PIP joint. EXAM: LEFT MIDDLE FINGER 2+V COMPARISON:  Earlier today. FINDINGS: On the PA and oblique images, the previously described impacted, subluxed fracture of the base of the 3rd middle phalanx is unchanged. In the true lateral projection, the major fragments are unchanged. There is amorphous ossific density at the previous location of the proximal ventral fragment, possibly due to differences in positioning. Associated soft tissue swelling is again demonstrated. IMPRESSION: No significant change in the previously described impacted  fracture/subluxation at the 3rd PIP joint. Electronically Signed   By: Claudie Revering M.D.   On: 07/28/2021 16:39   DG Finger Middle Left  Result Date: 07/28/2021 CLINICAL DATA:  Fall, left finger injury EXAM: LEFT MIDDLE FINGER 2+V COMPARISON:  None. FINDINGS: Dislocation of the left long finger PIP joint in a dorsal-ulnar direction. Acute mildly displaced fracture of the base of the ring finger middle phalanx. Diffuse soft tissue swelling. IMPRESSION: Acute fracture-dislocation of the left long finger PIP joint. Electronically Signed   By: Davina Poke D.O.   On: 07/28/2021 13:59   DG HIP UNILAT WITH PELVIS MIN 4 VIEWS LEFT  Result Date: 08/11/2021 CLINICAL DATA:  Unwitnessed fall.  Left hip pain. EXAM: DG HIP (WITH OR WITHOUT PELVIS) 4+V LEFT COMPARISON:  Femur radiograph on reformats from abdominopelvic CT 07/28/2021 FINDINGS: There is a minimally displaced and mildly comminuted fracture of the left greater trochanter, retrospectively also seen on prior exam. Femoral head remains seated. Intact pubic rami. Pubic symphysis and sacroiliac joints are congruent. IMPRESSION: Minimally displaced and mildly comminuted left greater trochanteric fracture. Electronically Signed   By: Keith Rake M.D.   On: 08/11/2021 19:34   DG Femur Min 2 Views Left  Result Date: 07/28/2021 CLINICAL DATA:  Fall, left leg pain EXAM: LEFT FEMUR 2 VIEWS COMPARISON:  None. FINDINGS: There is no evidence of fracture or other focal bone lesions. Alignment at the hip and knee are maintained. Scattered vascular calcifications. Soft tissues are unremarkable. IMPRESSION: Negative. Electronically Signed   By: Davina Poke D.O.   On: 07/28/2021 14:03    Microbiology: Results for orders placed or performed during the hospital encounter of 08/11/21  Resp Panel by RT-PCR (Flu A&B, Covid) Nasopharyngeal Swab     Status: Abnormal   Collection Time: 08/11/21  3:41 PM   Specimen: Nasopharyngeal Swab; Nasopharyngeal(NP) swabs in  vial transport medium  Result Value Ref Range Status   SARS Coronavirus 2 by RT PCR POSITIVE (A) NEGATIVE Final    Comment: (NOTE) SARS-CoV-2 target nucleic acids are DETECTED.  The SARS-CoV-2 RNA is generally detectable in upper respiratory specimens during the acute phase of infection. Positive results are indicative of the presence of the identified virus, but do not rule out bacterial infection or co-infection with other pathogens not detected by the test. Clinical correlation with patient history and other diagnostic information is necessary to determine patient infection status. The expected result is Negative.  Fact Sheet for Patients: EntrepreneurPulse.com.au  Fact Sheet for Healthcare Providers: IncredibleEmployment.be  This test is not yet approved or cleared by the [place] FDA and  has been authorized for detection and/or diagnosis of SARS-CoV-2 by FDA under an Emergency Use Authorization (EUA).  This EUA will remain in effect (meaning this test can be used) for the duration of  the COVID-19 declaration under Section 564(b)(1) of the  A ct, 21 U.S.C. section 360bbb-3(b)(1), unless the authorization is terminated or revoked sooner.     Influenza A by PCR NEGATIVE NEGATIVE Final   Influenza B by PCR NEGATIVE NEGATIVE Final    Comment: (NOTE) The Xpert Xpress SARS-CoV-2/FLU/RSV plus assay is intended as an aid in the diagnosis of influenza from Nasopharyngeal swab specimens and should not be used as a sole basis for treatment. Nasal washings and aspirates are unacceptable for Xpert Xpress SARS-CoV-2/FLU/RSV testing.  Fact Sheet for Patients: EntrepreneurPulse.com.au  Fact Sheet for Healthcare Providers: IncredibleEmployment.be  This test is not yet approved or cleared by the [place] FDA and has been authorized for detection and/or diagnosis of SARS-CoV-2 by FDA under an Emergency Use  Authorization (EUA). This EUA will remain in effect (meaning this test can be used) for the duration of the COVID-19 declaration under Section 564(b)(1) of the Act, 21 U.S.C. section 360bbb-3(b)(1), unless the authorization is terminated or revoked.  Performed at [facility], [place] [address], [place], [place] [address]   Culture, blood (routine x 2)     Status: None   Collection Time: 08/11/21  5:37 PM   Specimen: BLOOD  Result Value Ref Range Status   Specimen Description   Final    BLOOD BLOOD RIGHT WRIST Performed at [facility] [address] [place], [place] [address]    Special Requests   Final    BOTTLES DRAWN AEROBIC AND ANAEROBIC Blood Culture results may not be optimal due to an inadequate volume of blood received in culture bottles Performed at [facility] [address], [place], [place] [address]    Culture   Final    NO GROWTH 5 DAYS Performed at [facility], [place] [address] [place], [place] [address]    Report Status 08/16/2021 FINAL  Final  Culture, blood (routine x 2)     Status: None   Collection Time: 08/11/21  5:37 PM   Specimen: BLOOD  Result Value Ref Range Status   Specimen Description   Final    BLOOD LEFT ANTECUBITAL Performed at [facility] [address] [place], [place] [address]    Special Requests   Final    BOTTLES DRAWN AEROBIC AND ANAEROBIC Blood Culture results may not be optimal due to an inadequate volume of blood received in culture bottles Performed at [facility] [address] [place], [place] [address]    Culture   Final    NO GROWTH 5 DAYS Performed at [facility], [place] [address], [place], [place] [address]    Report Status 08/16/2021 FINAL  Final  C Difficile Quick Screen w PCR reflex     Status: None   Collection Time: 08/11/21 10:45 PM   Specimen: STOOL  Result Value Ref Range Status   C Diff antigen  NEGATIVE NEGATIVE Final   C Diff toxin NEGATIVE NEGATIVE Final   C Diff interpretation No C. difficile detected.  Final    Comment: Performed at [facility], [place] [address] [place], [place] [address]    Labs: CBC: Recent Labs  Lab 08/14/21 [phone] 08/15/21 0415 08/16/21 0343 08/19/21 0407  WBC 7.2 11.1* 7.7 8.1  NEUTROABS 6.3 8.8* 5.6 5.2  HGB 11.1* 11.4* 11.4* 12.2  HCT 34.7* 35.2* 34.5* 38.1  MCV 91.6 90.0 89.8 91.4  PLT 211 241 216 474   Basic Metabolic Panel: Recent Labs  Lab 08/14/21 0346 08/15/21 0415 08/16/21  0343 08/19/21 0407  NA 133* 136 135 134*  K 4.1 3.7 4.3 4.3  CL 100 103 105 102  CO2 25 26 25 25   GLUCOSE 216* 169* 195* 150*  BUN 26* 24* 21* 25*  CREATININE 0.52 0.41* 0.45 0.70  CALCIUM 8.1* 8.1* 8.2* 8.4*  MG 2.1 2.0 2.1  --    Liver Function Tests: Recent Labs  Lab 08/14/21 0346 08/15/21 0415 08/16/21 0343 08/19/21 0407  AST 10* 14* 13* 20  ALT 12 13 15  34  ALKPHOS 89 87 77 86  BILITOT 0.3 0.2* 0.2* 0.3  PROT 5.6* 5.4* 5.2* 5.5*  ALBUMIN 2.4* 2.3* 2.3* 2.5*   CBG: No results for input(s): GLUCAP in the last 168 hours.  Discharge time spent: greater than 30 minutes.  Signed: Nita Sells, MD [facility] 08/20/2021

## 2021-08-20 NOTE — Care Management Important Message (Signed)
Important Message ? ?Patient Details IM Letter given to the Patient. ?Name: Sandra [name] ?MRN: [id number] ?Date of Birth: 02-Apr-1961 ? ? ?Medicare Important Message Given:  Yes ? ? ? ? ?Kerin Salen ?08/20/2021, 12:48 PM ?

## 2021-08-20 NOTE — TOC Progression Note (Signed)
Transition of Care (TOC) - Progression Note  ? ? ?Patient Details  ?Name: Sandra [name] ?MRN: [id number] ?Date of Birth: 10/19/1960 ? ?Transition of Care (TOC) CM/SW Contact  ?Purcell Mouton, RN ?Phone Number: ?08/20/2021, 3:06 PM ? ?Clinical Narrative:    ? ?PASRR Number [id number] E exp 09/16/21.  ? ?  ?  ? ?Expected Discharge Plan and Services ?  ?  ?  ?  ?  ?Expected Discharge Date: 08/20/21               ?  ?  ?  ?  ?  ?  ?  ?  ?  ?  ? ? ?Social Determinants of Health (SDOH) Interventions ?  ? ?Readmission Risk Interventions ?No flowsheet data found. ? ?

## 2021-09-14 ENCOUNTER — Ambulatory Visit (INDEPENDENT_AMBULATORY_CARE_PROVIDER_SITE_OTHER): Payer: Medicare (Managed Care) | Admitting: Orthopaedic Surgery

## 2021-09-14 ENCOUNTER — Ambulatory Visit (INDEPENDENT_AMBULATORY_CARE_PROVIDER_SITE_OTHER): Payer: Medicare (Managed Care)

## 2021-09-14 ENCOUNTER — Encounter: Payer: Self-pay | Admitting: Orthopaedic Surgery

## 2021-09-14 ENCOUNTER — Other Ambulatory Visit: Payer: Self-pay

## 2021-09-14 VITALS — BP 128/78 | HR 48 | Ht 65.0 in | Wt 171.0 lb

## 2021-09-14 DIAGNOSIS — M25552 Pain in left hip: Secondary | ICD-10-CM

## 2021-09-14 DIAGNOSIS — M79645 Pain in left finger(s): Secondary | ICD-10-CM

## 2021-09-17 ENCOUNTER — Ambulatory Visit (INDEPENDENT_AMBULATORY_CARE_PROVIDER_SITE_OTHER): Payer: Medicare (Managed Care) | Admitting: Orthopedic Surgery

## 2021-09-17 DIAGNOSIS — S62629A Displaced fracture of medial phalanx of unspecified finger, initial encounter for closed fracture: Secondary | ICD-10-CM | POA: Diagnosis not present

## 2021-09-17 DIAGNOSIS — S62623P Displaced fracture of medial phalanx of left middle finger, subsequent encounter for fracture with malunion: Secondary | ICD-10-CM

## 2021-09-17 NOTE — Progress Notes (Signed)
? ?Office Visit Note ?  ?Patient: Sandra [name]           ?Date of Birth: June 10, 1961           ?MRN: [id number] ?Visit Date: 09/14/2021 ?             ?Requested by: Guadlupe Spanish, MD ?[address] ?[place],  [place] [id number] ?PCP: Guadlupe Spanish, MD ? ? ?Assessment & Plan: ?Visit Diagnoses:  ?1. Pain in left hip   ?2. Pain of left middle finger   ? ? ?Plan: With her nondisplaced trochanter fracture.  She will see Dr. Tempie Donning in the next couple days we discussed with her that she will require fusion for the severe intra-articular fracture with treatment delay due to ? ?Follow-Up Instructions: No follow-ups on file.  ? ?Orders:  ?Orders Placed This Encounter  ?Procedures  ? XR HIP UNILAT W OR W/O PELVIS 2-3 VIEWS LEFT  ? XR Finger Middle Left  ? ?No orders of the defined types were placed in this encounter. ? ? ? ? Procedures: ?No procedures performed ? ? ?Clinical Data: ?No additional findings. ? ? ?Subjective: ?Chief Complaint  ?Patient presents with  ? Left Hip - Fracture, Follow-up  ? Left Middle Finger - Fracture, Follow-up  ? ? ?HPI 61 year old female fell  07/28/21 with left finger injury and was placed in a splint was  back in the  emergency department 08/11/2021 with a nondisplaced greater trochanter fracture on the left and acute COVID with dyspnea.  She was in the hospital with acute respiratory failure with COVID.  When she the hospital she took her postop papers and was supposed to follow-up with hand surgeon at [facility] but left the papers that her primary care physician and now comes in today more than 5 to 6 weeks later still in the same splint applied in the ER with comminuted PIP fracture with angulation and shortening.  She states her hip is doing well she has been nonweightbearing when she did put a little bit of weight on it she was not having pain. ? ?Review of Systems all the systems noncontributory. ? ? ?Objective: ?Vital Signs: BP 128/78   Pulse (!) 48   Ht 5' 5"  (1.651 m)   Wt 171  lb (77.6 kg)   BMI 28.46 kg/m?  ? ?Physical Exam ?Constitutional:   ?   Appearance: She is well-developed.  ?HENT:  ?   Head: Normocephalic.  ?   Right Ear: External ear normal.  ?   Left Ear: External ear normal. There is no impacted cerumen.  ?Eyes:  ?   Pupils: Pupils are equal, round, and reactive to light.  ?Neck:  ?   Thyroid: No thyromegaly.  ?   Trachea: No tracheal deviation.  ?Cardiovascular:  ?   Rate and Rhythm: Normal rate.  ?Pulmonary:  ?   Effort: Pulmonary effort is normal.  ?Abdominal:  ?   Palpations: Abdomen is soft.  ?Musculoskeletal:  ?   Cervical back: No rigidity.  ?Skin: ?   General: Skin is warm and dry.  ?Neurological:  ?   Mental Status: She is alert and oriented to person, place, and time.  ?Psychiatric:     ?   Behavior: Behavior normal.  ? ? ?Ortho Exam minimal discomfort with hip range of motion.  Finger splint present and x-rays left middle finger shows intra-articular fracture displaced with angulation. ? ?Specialty Comments:  ?No specialty comments available. ? ?Imaging: ?No results found. ? ? ?  PMFS History: ?Patient Active Problem List  ? Diagnosis Date Noted  ? Oral thrush 08/15/2021  ? Cellulitis of right buttock 08/13/2021  ? Pressure ulcer 08/13/2021  ? Pneumonia due to COVID-19 virus 08/11/2021  ? Unwitnessed fall 08/11/2021  ? Closed left hip fracture, sequela 08/11/2021  ? Closed fracture of phalanx of left middle finger 08/11/2021  ? Acute metabolic encephalopathy 59/56/3875  ? Hallucination 08/11/2021  ? Acute respiratory failure with hypoxia ([place]) 08/11/2021  ? Mature cataract 04/06/2020  ? Visual field defect 04/06/2020  ? Hyperopia of left eye with astigmatism and presbyopia 04/06/2020  ? 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 Colon polyps 08/21/2017  ? Symptomatic cholelithiasis 04/26/2017  ? Undiagnosed cardiac murmurs 06/24/2016  ? Morbid obesity ([place]) 06/05/2015  ? GAD (generalized anxiety disorder) 04/08/2015  ? Spastic hemiplegia affecting nondominant side  ([place]) 04/02/2015  ? Benzodiazepine withdrawal with complication ([place]) 64/33/2951  ? Hypokalemia 12/23/2013  ? Cold intolerance 11/25/2013  ? Recurrent pneumonia 11/06/2013  ? MRSA bacteremia 11/06/2013  ? Bilateral endophthalmitis 11/06/2013  ? Metabolic syndrome 88/41/6606  ? Routine adult health maintenance 08/13/2013  ? Dyspareunia, female 02/06/2013  ? Lumbar and sacral osteoarthritis 12/06/2012  ? Reported ulcerative colitis ([place]) 11/15/2012  ? Insomnia 07/23/2012  ? HIV exposure 06/27/2012  ? Gout 01/31/2012  ? Benzodiazepine dependence, continuous ([place]) 01/26/2012  ?  Class: Chronic  ? Hot flashes 01/09/2012  ? Tobacco abuse 01/09/2012  ? Tobacco dependence syndrome 01/09/2012  ? Arthritis 04/16/2011  ? Anxiety 04/14/2011  ? Incontinence 04/14/2011  ? PFO (patent foramen ovale) 03/07/2011  ? CAD (coronary artery disease) 03/07/2011  ? Essential hypertension 03/07/2011  ? Hyperlipidemia 03/07/2011  ? Depression 03/07/2011  ? Psoriasis 03/07/2011  ? H/O: CVA (cerebrovascular accident) 01/27/2011  ? ?Past Medical History:  ?Diagnosis Date  ? Acute right MCA stroke ([facility] [place]) 2006  ? Acute R MCA stroked  with L arm and leg weakness  ? Anxiety 1980s  ? On Klonipin since age 25. Had addiction problem with Xanax which  Was therefore d/c . Seen in the past at [facility].   ? Arthritis 04/16/2011  ? Blind right eye   ? CAD (coronary artery disease) 2007  ? istory of MI with stent in 2007 by Dr. Chancy Milroy, [facility]. Stent placement by Dr Cleatis Polka   ? Chronic pain   ? Depression 03/07/2011  ? Fracture of fifth toe, left, closed 01/28/2011  ? History of left fifth toe proximal phalanx fracture when patient had a stroke (01/2011) and fell. Seen by Dr Doran Durand.    ? Fractured toe 01/2011  ? History of left fifth toe proximal phalanx fracture when patient had a stroke (01/2011) and fell. Seen by Dr Doran Durand.   ? Hyperlipidemia 03/07/2011  ? Hypertension 03/07/2011  ? Incontinence 04/14/2011  ? Myocardial infarct, old 2008  ?  stents  ? PFO (patent foramen ovale) August 2012  ? PFO seen on TEE during hospitalization in 01/2011.  Patient to f/u with Dr. Leonie Man, neurology, for enrollment in trial for medical treatment of PFO  ? Pneumonia   ? Prediabetes 03/07/2011  ? Psoriasis 03/07/2011  ? Substance abuse ([place])   ? h/o narcotic abuse pt denies as of 01/09/12  ? Tobacco abuse 03/07/2011  ? Quit 01/2011   ?  ?Family History  ?Problem Relation Age of Onset  ? Heart disease Mother   ? Diabetes Mother   ? Heart disease Father   ? Diabetes Father   ? Cancer Sister  89  ?     ovarian  ? Heart disease Brother   ? Diabetes Brother   ? Heart disease Maternal Grandmother   ? Heart disease Maternal Grandfather   ? Heart disease Paternal Grandmother   ? Heart disease Paternal Grandfather   ? Lung cancer Brother   ? Heart disease Brother   ?  ?Past Surgical History:  ?Procedure Laterality Date  ? BIOPSY THYROID    ? CARDIAC CATHETERIZATION    ? cardiac stent    ? 2008  ? CHOLECYSTECTOMY N/A 07/28/2017  ? Procedure: LAPAROSCOPIC CHOLECYSTECTOMY WITH INTRAOPERATIVE CHOLANGIOGRAM;  Surgeon: Jovita Kussmaul, MD;  Location: [facility];  Service: General;  Laterality: N/A;  ? EYE SURGERY    ? IR GENERIC HISTORICAL  03/16/2016  ? IR RADIOLOGIST EVAL & MGMT 03/16/2016 Marybelle Killings, MD [facility] INTERV RAD  ? ?Social History  ? ?Occupational History  ? Occupation: Disabled  ?Tobacco Use  ? Smoking status: Every Day  ?  Packs/day: 2.00  ?  Years: 42.00  ?  Pack years: 84.00  ?  Types: Cigarettes  ? Smokeless tobacco: Never  ? Tobacco comments:  ?  Trying to quit again.  ?Substance and Sexual Activity  ? Alcohol use: No  ?  Alcohol/week: 0.0 standard drinks  ? Drug use: No  ? Sexual activity: Yes  ?  Partners: Male  ?  Birth control/protection: None  ? ? ? ? ? ? ?

## 2021-09-17 NOTE — Progress Notes (Signed)
? ?Office Visit Note ?  ?Patient: Sandra [name]           ?Date of Birth: Jul 10, 1960           ?MRN: [id number] ?Visit Date: 09/17/2021 ?             ?Requested by: Guadlupe Spanish, MD ?[address] ?[place],  [place] [id number] ?PCP: Guadlupe Spanish, MD ? ? ?Assessment & Plan: ?Visit Diagnoses:  ?1. Closed displaced fracture of middle phalanx of left middle finger with malunion   ?2. Displaced fracture of middle phalanx of finger of left hand   ? ? ?Plan: Discussed with patient and her sister that she has a complicated pilon type fracture involving the left middle finger PIP joint.  The fracture is quite complex and is now 7 or 71 weeks old.  Discussed that reconstruction of the PIP joint would be exceedingly difficult at this point.  We discussed her other options including arthrodesis of the left middle finger PIP joint versus nonoperative management with splinting and therapy.  She and her sister note that she has diminished use of this hand at baseline secondary to her prior CVA.  They both agree that the best option for her would be nonoperative management.  The patient is not interested in surgery at this point.  I will refer her to hand therapy to have a removable splint made.  She can follow-up with me as needed. ? ?Follow-Up Instructions: No follow-ups on file.  ? ?Orders:  ?No orders of the defined types were placed in this encounter. ? ?No orders of the defined types were placed in this encounter. ? ? ? ? Procedures: ?No procedures performed ? ? ?Clinical Data: ?No additional findings. ? ? ?Subjective: ?Chief Complaint  ?Patient presents with  ? Left Middle Finger - Fracture  ? ? ?This is a 61 year old right-handed female with a complicated past medical history including CVA with multiple falls and difficulty with balance who presents with a fracture of the left middle finger PIP joint after a ground-level fall.  Her fall occurred between 6 and 8 weeks ago.  She does not remember the details of the fall but  notes that she was not wearing her normal AFO on the left lower extremity.  She has a history of a CVA with subsequent difficulty with balance.  She's had many falls over the last several months.  She describes continued pain at the PIP joint that is rated as 2/10 in severity.  She and her sister note that she has diminished use of this hand at baseline secondary to a prior stroke.  She lives alone but is currently in a rehab facility for a hip fracture. ? ? ?Review of Systems ? ? ?Objective: ?Vital Signs: BP 139/88 (BP Location: Left Arm, Patient Position: Sitting)   Pulse (!) 53   Ht 5' 5"  (1.651 m)   Wt 171 lb (77.6 kg)   BMI 28.46 kg/m?  ? ?Physical Exam ?Constitutional:   ?   Appearance: Normal appearance.  ?Cardiovascular:  ?   Rate and Rhythm: Normal rate.  ?   Pulses: Normal pulses.  ?Skin: ?   General: Skin is warm and dry.  ?   Capillary Refill: Capillary refill takes less than 2 seconds.  ?Neurological:  ?   Mental Status: She is alert.  ? ? ?Left Hand Exam  ? ?Tenderness  ?Left hand tenderness location: TTP at middle finger PIP joint w/ moderate swelling.  ? ?Other  ?  Erythema: absent ?Sensation: normal ?Pulse: present ? ?Comments:  Limited ROM of middle finger PIP joint secondary to mild pain, swelling, and deformity.  Able to make complete fist with remaining fingers.  ? ? ? ? ?Specialty Comments:  ?No specialty comments available. ? ?Imaging: ?No results found. ? ? ?PMFS History: ?Patient Active Problem List  ? Diagnosis Date Noted  ? Displaced fracture of middle phalanx of finger of left hand 09/17/2021  ? Oral thrush 08/15/2021  ? Cellulitis of right buttock 08/13/2021  ? Pressure ulcer 08/13/2021  ? Pneumonia due to COVID-19 virus 08/11/2021  ? Unwitnessed fall 08/11/2021  ? Closed left hip fracture, sequela 08/11/2021  ? Closed fracture of phalanx of left middle finger 08/11/2021  ? Acute metabolic encephalopathy 37/62/8315  ? Hallucination 08/11/2021  ? Acute respiratory failure with hypoxia  ([place]) 08/11/2021  ? Mature cataract 04/06/2020  ? Visual field defect 04/06/2020  ? Hyperopia of left eye with astigmatism and presbyopia 04/06/2020  ? 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 Colon polyps 08/21/2017  ? Symptomatic cholelithiasis 04/26/2017  ? Undiagnosed cardiac murmurs 06/24/2016  ? Morbid obesity ([place]) 06/05/2015  ? GAD (generalized anxiety disorder) 04/08/2015  ? Spastic hemiplegia affecting nondominant side ([place]) 04/02/2015  ? Benzodiazepine withdrawal with complication ([place]) 17/61/6073  ? Hypokalemia 12/23/2013  ? Cold intolerance 11/25/2013  ? Recurrent pneumonia 11/06/2013  ? MRSA bacteremia 11/06/2013  ? Bilateral endophthalmitis 11/06/2013  ? Metabolic syndrome 71/11/2692  ? Routine adult health maintenance 08/13/2013  ? Dyspareunia, female 02/06/2013  ? Lumbar and sacral osteoarthritis 12/06/2012  ? Reported ulcerative colitis ([place]) 11/15/2012  ? Insomnia 07/23/2012  ? HIV exposure 06/27/2012  ? Gout 01/31/2012  ? Benzodiazepine dependence, continuous ([place]) 01/26/2012  ?  Class: Chronic  ? Hot flashes 01/09/2012  ? Tobacco abuse 01/09/2012  ? Tobacco dependence syndrome 01/09/2012  ? Arthritis 04/16/2011  ? Anxiety 04/14/2011  ? Incontinence 04/14/2011  ? PFO (patent foramen ovale) 03/07/2011  ? CAD (coronary artery disease) 03/07/2011  ? Essential hypertension 03/07/2011  ? Hyperlipidemia 03/07/2011  ? Depression 03/07/2011  ? Psoriasis 03/07/2011  ? H/O: CVA (cerebrovascular accident) 01/27/2011  ? ?Past Medical History:  ?Diagnosis Date  ? Acute right MCA stroke ([place]) 2006  ? Acute R MCA stroked  with L arm and leg weakness  ? Anxiety 1980s  ? On Klonipin since age 3. Had addiction problem with Xanax which  Was therefore d/c . [facility] the [facility].   ? Arthritis 04/16/2011  ? Blind right eye   ? CAD (coronary artery disease) 2007  ? istory of MI with stent in 2007 by Dr. Chancy Milroy, [facility]. Stent placement by Dr Cleatis Polka   ? Chronic pain   ? Depression  03/07/2011  ? Fracture of fifth toe, left, closed 01/28/2011  ? History of left fifth toe proximal phalanx fracture when patient had a stroke (01/2011) and fell. Seen by Dr Doran Durand.    ? Fractured toe 01/2011  ? History of left fifth toe proximal phalanx fracture when patient had a stroke (01/2011) and fell. Seen by Dr Doran Durand.   ? Hyperlipidemia 03/07/2011  ? Hypertension 03/07/2011  ? Incontinence 04/14/2011  ? Myocardial infarct, old 2008  ? stents  ? PFO (patent foramen ovale) August 2012  ? PFO seen on TEE during hospitalization in 01/2011.  Patient to f/u with Dr. Leonie Man, neurology, for enrollment in trial for medical treatment of PFO  ? Pneumonia   ? Prediabetes 03/07/2011  ? Psoriasis  03/07/2011  ? Substance abuse ([place])   ? h/o narcotic abuse pt denies as of 01/09/12  ? Tobacco abuse 03/07/2011  ? Quit 01/2011   ?  ?Family History  ?Problem Relation Age of Onset  ? Heart disease Mother   ? Diabetes Mother   ? Heart disease Father   ? Diabetes Father   ? Cancer Sister 30  ?     ovarian  ? Heart disease Brother   ? Diabetes Brother   ? Heart disease Maternal Grandmother   ? Heart disease Maternal Grandfather   ? Heart disease Paternal Grandmother   ? Heart disease Paternal Grandfather   ? Lung cancer Brother   ? Heart disease Brother   ?  ?Past Surgical History:  ?Procedure Laterality Date  ? BIOPSY THYROID    ? CARDIAC CATHETERIZATION    ? cardiac stent    ? 2008  ? CHOLECYSTECTOMY N/A 07/28/2017  ? Procedure: LAPAROSCOPIC CHOLECYSTECTOMY WITH INTRAOPERATIVE CHOLANGIOGRAM;  Surgeon: Jovita Kussmaul, MD;  Location: [facility];  Service: General;  Laterality: N/A;  ? EYE SURGERY    ? IR GENERIC HISTORICAL  03/16/2016  ? IR RADIOLOGIST EVAL & MGMT 03/16/2016 Marybelle Killings, MD [facility] INTERV RAD  ? ?Social History  ? ?Occupational History  ? Occupation: Disabled  ?Tobacco Use  ? Smoking status: Every Day  ?  Packs/day: 2.00  ?  Years: 42.00  ?  Pack years: 84.00  ?  Types: Cigarettes  ? Smokeless tobacco: Never  ? Tobacco comments:  ?   Trying to quit again.  ?Substance and Sexual Activity  ? Alcohol use: No  ?  Alcohol/week: 0.0 standard drinks  ? Drug use: No  ? Sexual activity: Yes  ?  Partners: Male  ?  Birth control/protection: None  ? ? ? ? ? ? ?

## 2021-09-20 ENCOUNTER — Telehealth: Payer: Self-pay | Admitting: Orthopedic Surgery

## 2021-09-20 NOTE — Telephone Encounter (Signed)
Patient's sister called. [facility] offer Occupational therapy. They would like the office notes.  ?

## 2021-09-22 ENCOUNTER — Encounter: Payer: Medicare (Managed Care) | Admitting: Rehabilitative and Restorative Service Providers"

## 2021-09-22 NOTE — Therapy (Incomplete)
?OUTPATIENT OCCUPATIONAL THERAPY ORTHO EVALUATION ? ?Patient Name: Sandra [name] ?MRN: [id number] ?DOB:Oct 28, 1960, 61 y.o., female ?Today's Date: 09/22/2021 ? ?PCP: Guadlupe Spanish, MD ?REFERRING PROVIDER: Sherilyn Cooter, MD ? ? ? ?Past Medical History:  ?Diagnosis Date  ? Acute right MCA stroke ([place]) 2006  ? Acute R MCA stroked  with L arm and leg weakness  ? Anxiety 1980s  ? On Klonipin since age 84. Had addiction problem with Xanax which  Was therefore d/c . Seen in the past at [facility].   ? Arthritis 04/16/2011  ? Blind right eye   ? CAD (coronary artery disease) 2007  ? istory of MI with stent in 2007 by Dr. Chancy Milroy, [facility]. Stent placement by Dr Cleatis Polka   ? Chronic pain   ? Depression 03/07/2011  ? Fracture of fifth toe, left, closed 01/28/2011  ? History of left fifth toe proximal phalanx fracture when patient had a stroke (01/2011) and fell. Seen by Dr Doran Durand.    ? Fractured toe 01/2011  ? History of left fifth toe proximal phalanx fracture when patient had a stroke (01/2011) and fell. Seen by Dr Doran Durand.   ? Hyperlipidemia 03/07/2011  ? Hypertension 03/07/2011  ? Incontinence 04/14/2011  ? Myocardial infarct, old 2008  ? stents  ? PFO (patent foramen ovale) August 2012  ? PFO seen on TEE during hospitalization in 01/2011.  Patient to f/u with Dr. Leonie Man, neurology, for enrollment in trial for medical treatment of PFO  ? Pneumonia   ? Prediabetes 03/07/2011  ? Psoriasis 03/07/2011  ? Substance abuse ([place])   ? h/o narcotic abuse pt denies as of 01/09/12  ? Tobacco abuse 03/07/2011  ? Quit 01/2011   ? ?Past Surgical History:  ?Procedure Laterality Date  ? BIOPSY THYROID    ? CARDIAC CATHETERIZATION    ? cardiac stent    ? 2008  ? CHOLECYSTECTOMY N/A 07/28/2017  ? Procedure: LAPAROSCOPIC CHOLECYSTECTOMY WITH INTRAOPERATIVE CHOLANGIOGRAM;  Surgeon: Jovita Kussmaul, MD;  Location: [place];  Service: General;  Laterality: N/A;  ? EYE SURGERY    ? IR GENERIC HISTORICAL  03/16/2016  ? IR RADIOLOGIST EVAL & MGMT  03/16/2016 Marybelle Killings, MD [facility] INTERV RAD  ? ?Patient Active Problem List  ? Diagnosis Date Noted  ? Displaced fracture of middle phalanx of finger of left hand 09/17/2021  ? Oral thrush 08/15/2021  ? Cellulitis of right buttock 08/13/2021  ? Pressure ulcer 08/13/2021  ? Pneumonia due to COVID-19 virus 08/11/2021  ? Unwitnessed fall 08/11/2021  ? Closed left hip fracture, sequela 08/11/2021  ? Closed fracture of phalanx of left middle finger 08/11/2021  ? Acute metabolic encephalopathy 48/18/5631  ? Hallucination 08/11/2021  ? Acute respiratory failure with hypoxia ([place]) 08/11/2021  ? Mature cataract 04/06/2020  ? Visual field defect 04/06/2020  ? Hyperopia of left eye with astigmatism and presbyopia 04/06/2020  ? 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 Colon polyps 08/21/2017  ? Symptomatic cholelithiasis 04/26/2017  ? Undiagnosed cardiac murmurs 06/24/2016  ? Morbid obesity ([place]) 06/05/2015  ? GAD (generalized anxiety disorder) 04/08/2015  ? Spastic hemiplegia affecting nondominant side ([place]) 04/02/2015  ? Benzodiazepine withdrawal with complication ([place]) 49/70/2637  ? Hypokalemia 12/23/2013  ? Cold intolerance 11/25/2013  ? Recurrent pneumonia 11/06/2013  ? MRSA bacteremia 11/06/2013  ? Bilateral endophthalmitis 11/06/2013  ? Metabolic syndrome 85/88/5027  ? Routine adult health maintenance 08/13/2013  ? Dyspareunia, female 02/06/2013  ? Lumbar and sacral osteoarthritis 12/06/2012  ? Reported ulcerative colitis ([place])  11/15/2012  ? Insomnia 07/23/2012  ? HIV exposure 06/27/2012  ? Gout 01/31/2012  ? Benzodiazepine dependence, continuous ([place]) 01/26/2012  ?  Class: Chronic  ? Hot flashes 01/09/2012  ? Tobacco abuse 01/09/2012  ? Tobacco dependence syndrome 01/09/2012  ? Arthritis 04/16/2011  ? Anxiety 04/14/2011  ? Incontinence 04/14/2011  ? PFO (patent foramen ovale) 03/07/2011  ? CAD (coronary artery disease) 03/07/2011  ? Essential hypertension 03/07/2011  ? Hyperlipidemia 03/07/2011  ? Depression 03/07/2011   ? Psoriasis 03/07/2011  ? H/O: CVA (cerebrovascular accident) 01/27/2011  ? ? ?ONSET DATE: *** ? ?REFERRING DIAG: S62.629A (ICD-10-CM) - Displaced fracture of middle phalanx of finger of left hand ? ?THERAPY DIAG:  ?No diagnosis found. ? ?SUBJECTIVE:  ? ?SUBJECTIVE STATEMENT: ?09/22/21:  ?Pt accompanied by: {accompnied:27141} ? ?PERTINENT HISTORY: Per MD: "71 week old left middle finger PIP point fracture. Pt opted for nonoperative management versus joint fusion.  Would like removable splint and to work on adaption to limited function of middle finger in setting of already diminished hand function secondary to stroke." ? ?PRECAUTIONS: {Therapy precautions:24002} ? ?WEIGHT BEARING RESTRICTIONS {Yes ***/No:24003} ? ?PAIN:  ?Are you having pain? {OPRCPAIN:27236} ? ?FALLS: Has patient fallen in last 6 months? {fallsyesno:27318} ? ?LIVING ENVIRONMENT: ?Lives with: {OPRC lives with:25569::"lives with their family"} ?Lives in: {Lives in:25570} ?Stairs: {opstairs:27293} ?Has following equipment at home: {Assistive devices:23999} ? ?PLOF: {PLOF:24004} ? ?PATIENT GOALS *** ? ?OBJECTIVE:  ? ?HAND DOMINANCE: {MISC; OT HAND DOMINANCE:[phone]} ? ?ADLs: ?Overall ADLs: *** ?Transfers/ambulation related to ADLs: ?Eating: *** ?Grooming: *** ?UB Dressing: *** ?LB Dressing: *** ?Toileting: *** ?Bathing: *** ?Tub Shower transfers: *** ?Equipment: {equipment:25573} ? ?FUNCTIONAL OUTCOME MEASURES: ?{OTFUNCTIONALMEASURES:27238} ? ?UE ROM    ? ?{AROM/PROM:27142} ROM Right ?09/22/2021 Left ?09/22/2021  ?Shoulder flexion    ?Shoulder abduction    ?Shoulder adduction    ?Shoulder extension    ?Shoulder internal rotation    ?Shoulder external rotation    ?Elbow flexion    ?Elbow extension    ?Wrist flexion    ?Wrist extension    ?Wrist ulnar deviation    ?Wrist radial deviation    ?Wrist pronation    ?Wrist supination    ?(Blank rows = not tested) ? ?{AROM/PROM:27142} ROM Right ?09/22/2021 Left ?09/22/2021  ?Thumb MCP (0-60)    ?Thumb IP (0-80)     ?Thumb Radial abd/add (0-55)     ?Thumb Palmar abd/add (0-45)     ?Thumb Opposition to Small Finger     ?Index MCP (0-90)     ?Index PIP (0-100)     ?Index DIP (0-70)      ?Long MCP (0-90)      ?Long PIP (0-100)      ?Long DIP (0-70)      ?Ring MCP (0-90)      ?Ring PIP (0-100)      ?Ring DIP (0-70)      ?Little MCP (0-90)      ?Little PIP (0-100)      ?Little DIP (0-70)      ?(Blank rows = not tested) ? ? ?UE MMT:    ? ?MMT Right ?09/22/2021 Left ?09/22/2021  ?Shoulder flexion    ?Shoulder abduction    ?Shoulder adduction    ?Shoulder extension    ?Shoulder internal rotation    ?Shoulder external rotation    ?Middle trapezius    ?Lower trapezius    ?Elbow flexion    ?Elbow extension    ?Wrist flexion    ?Wrist extension    ?Wrist  ulnar deviation    ?Wrist radial deviation    ?Wrist pronation    ?Wrist supination    ?(Blank rows = not tested) ? ?HAND FUNCTION: ?Grip strength: Right: *** lbs; Left: *** lbs and 3 point pinch: Right: *** lbs, Left: *** lbs ? ?COORDINATION: ?{otcoordination:27237} ? ?SENSATION: ?{sensation:27233} ? ?EDEMA: *** ? ?COGNITION: ?Overall cognitive status: {cognition:24006} ?Areas of impairment: {impairedcognition:27234} ? ?OBSERVATIONS: *** ? ? ?TODAY'S TREATMENT:  ?09/22/21 Eval:  ?Custom orthotic fabrication was indicated due to pt's *** and need for safe, functional positioning. OT fabricated custom *** orthotic for pt today to ***. It fit well with no areas of pressure, pt states a comfortable fit. Pt was educated on the wearing schedule, to call or come in ASAP if it is causing any irritation or is not achieving desired function. It will be checked/adjusted in upcoming sessions, as needed. Pt states understanding.  ? ? ? ?PATIENT EDUCATION: ?Education details: See tx section above for details ?Person educated: {Person educated:25204} ?Education method: Explanation, Demonstration, Tactile cues, and Handouts ?Education comprehension: verbalized understanding, returned demonstration, tactile  cues required, and needs further education ? ? ?HOME EXERCISE PROGRAM: ?See tx section above for details ? ?GOALS: ?Goals reviewed with patient? {yes/no:20286} ? ?SHORT TERM GOALS: (STG required if POC>30 days) ?

## 2021-09-22 NOTE — Therapy (Incomplete)
?OUTPATIENT OCCUPATIONAL THERAPY ORTHO EVALUATION ? ?Patient Name: Sandra [name] ?MRN: [id number] ?DOB:1961-05-27, 61 y.o., female ?Today's Date: 09/22/2021 ? ?PCP: Guadlupe Spanish, MD ?REFERRING PROVIDER: Sherilyn Cooter, MD ? ? ? ?Past Medical History:  ?Diagnosis Date  ? Acute right MCA stroke ([place]) 2006  ? Acute R MCA stroked  with L arm and leg weakness  ? Anxiety 1980s  ? On Klonipin since age 68. Had addiction problem with Xanax which  Was therefore d/c . Seen in the past at [facility].   ? Arthritis 04/16/2011  ? Blind right eye   ? CAD (coronary artery disease) 2007  ? istory of MI with stent in 2007 by Dr. Chancy Milroy, [facility]. Stent placement by Dr Cleatis Polka   ? Chronic pain   ? Depression 03/07/2011  ? Fracture of fifth toe, left, closed 01/28/2011  ? History of left fifth toe proximal phalanx fracture when patient had a stroke (01/2011) and fell. Seen by Dr Doran Durand.    ? Fractured toe 01/2011  ? History of left fifth toe proximal phalanx fracture when patient had a stroke (01/2011) and fell. Seen by Dr Doran Durand.   ? Hyperlipidemia 03/07/2011  ? Hypertension 03/07/2011  ? Incontinence 04/14/2011  ? Myocardial infarct, old 2008  ? stents  ? PFO (patent foramen ovale) August 2012  ? PFO seen on TEE during hospitalization in 01/2011.  Patient to f/u with Dr. Leonie Man, neurology, for enrollment in trial for medical treatment of PFO  ? Pneumonia   ? Prediabetes 03/07/2011  ? Psoriasis 03/07/2011  ? Substance abuse ([place])   ? h/o narcotic abuse pt denies as of 01/09/12  ? Tobacco abuse 03/07/2011  ? Quit 01/2011   ? ?Past Surgical History:  ?Procedure Laterality Date  ? BIOPSY THYROID    ? CARDIAC CATHETERIZATION    ? cardiac stent    ? 2008  ? CHOLECYSTECTOMY N/A 07/28/2017  ? Procedure: LAPAROSCOPIC CHOLECYSTECTOMY WITH INTRAOPERATIVE CHOLANGIOGRAM;  Surgeon: Jovita Kussmaul, MD;  Location: [place];  Service: General;  Laterality: N/A;  ? EYE SURGERY    ? IR GENERIC HISTORICAL  03/16/2016  ? IR RADIOLOGIST EVAL & MGMT  03/16/2016 Marybelle Killings, MD [facility] INTERV RAD  ? ?Patient Active Problem List  ? Diagnosis Date Noted  ? Displaced fracture of middle phalanx of finger of left hand 09/17/2021  ? Oral thrush 08/15/2021  ? Cellulitis of right buttock 08/13/2021  ? Pressure ulcer 08/13/2021  ? Pneumonia due to COVID-19 virus 08/11/2021  ? Unwitnessed fall 08/11/2021  ? Closed left hip fracture, sequela 08/11/2021  ? Closed fracture of phalanx of left middle finger 08/11/2021  ? Acute metabolic encephalopathy 83/15/1761  ? Hallucination 08/11/2021  ? Acute respiratory failure with hypoxia ([place]) 08/11/2021  ? Mature cataract 04/06/2020  ? Visual field defect 04/06/2020  ? Hyperopia of left eye with astigmatism and presbyopia 04/06/2020  ? 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 Colon polyps 08/21/2017  ? Symptomatic cholelithiasis 04/26/2017  ? Undiagnosed cardiac murmurs 06/24/2016  ? Morbid obesity ([place]) 06/05/2015  ? GAD (generalized anxiety disorder) 04/08/2015  ? Spastic hemiplegia affecting nondominant side ([place]) 04/02/2015  ? Benzodiazepine withdrawal with complication ([place]) 60/73/7106  ? Hypokalemia 12/23/2013  ? Cold intolerance 11/25/2013  ? Recurrent pneumonia 11/06/2013  ? MRSA bacteremia 11/06/2013  ? Bilateral endophthalmitis 11/06/2013  ? Metabolic syndrome 26/94/8546  ? Routine adult health maintenance 08/13/2013  ? Dyspareunia, female 02/06/2013  ? Lumbar and sacral osteoarthritis 12/06/2012  ? Reported ulcerative colitis ([place])  11/15/2012  ? Insomnia 07/23/2012  ? HIV exposure 06/27/2012  ? Gout 01/31/2012  ? Benzodiazepine dependence, continuous ([place]) 01/26/2012  ?  Class: Chronic  ? Hot flashes 01/09/2012  ? Tobacco abuse 01/09/2012  ? Tobacco dependence syndrome 01/09/2012  ? Arthritis 04/16/2011  ? Anxiety 04/14/2011  ? Incontinence 04/14/2011  ? PFO (patent foramen ovale) 03/07/2011  ? CAD (coronary artery disease) 03/07/2011  ? Essential hypertension 03/07/2011  ? Hyperlipidemia 03/07/2011  ? Depression 03/07/2011   ? Psoriasis 03/07/2011  ? H/O: CVA (cerebrovascular accident) 01/27/2011  ? ? ?ONSET DATE: *** ? ?REFERRING DIAG: S62.629A (ICD-10-CM) - Displaced fracture of middle phalanx of finger of left hand ? ?THERAPY DIAG:  ?No diagnosis found. ? ?SUBJECTIVE:  ? ?SUBJECTIVE STATEMENT: ?*** ?Pt accompanied by: {accompnied:27141} ? ?PERTINENT HISTORY: Per MD: "20 week old left middle finger PIP point fracture. Pt opted for nonoperative management versus joint fusion.  Would like removable splint and to work on adaption to limited function of middle finger in setting of already diminished hand function secondary to stroke." ? ?PRECAUTIONS: {Therapy precautions:24002} ? ?WEIGHT BEARING RESTRICTIONS {Yes ***/No:24003} ? ?PAIN:  ?Are you having pain? {OPRCPAIN:27236} ? ?FALLS: Has patient fallen in last 6 months? {fallsyesno:27318} ? ?LIVING ENVIRONMENT: ?Lives with: {OPRC lives with:25569::"lives with their family"} ?Lives in: {Lives in:25570} ?Stairs: {opstairs:27293} ?Has following equipment at home: {Assistive devices:23999} ? ?PLOF: {PLOF:24004} ? ?PATIENT GOALS *** ? ?OBJECTIVE:  ? ?HAND DOMINANCE: {MISC; OT HAND [phone]} ? ?ADLs: ?Overall ADLs: *** ?Transfers/ambulation related to ADLs: ?Eating: *** ?Grooming: *** ?UB Dressing: *** ?LB Dressing: *** ?Toileting: *** ?Bathing: *** ?Tub Shower transfers: *** ?Equipment: {equipment:25573} ? ?FUNCTIONAL OUTCOME MEASURES: ?{OTFUNCTIONALMEASURES:27238} ? ?UE ROM    ? ?{AROM/PROM:27142} ROM Right ?09/22/2021 Left ?09/22/2021  ?Shoulder flexion    ?Shoulder abduction    ?Shoulder adduction    ?Shoulder extension    ?Shoulder internal rotation    ?Shoulder external rotation    ?Elbow flexion    ?Elbow extension    ?Wrist flexion    ?Wrist extension    ?Wrist ulnar deviation    ?Wrist radial deviation    ?Wrist pronation    ?Wrist supination    ?(Blank rows = not tested) ? ?{AROM/PROM:27142} ROM Right ?09/22/2021 Left ?09/22/2021  ?Thumb MCP (0-60)    ?Thumb IP (0-80)    ?Thumb  Radial abd/add (0-55)     ?Thumb Palmar abd/add (0-45)     ?Thumb Opposition to Small Finger     ?Index MCP (0-90)     ?Index PIP (0-100)     ?Index DIP (0-70)      ?Long MCP (0-90)      ?Long PIP (0-100)      ?Long DIP (0-70)      ?Ring MCP (0-90)      ?Ring PIP (0-100)      ?Ring DIP (0-70)      ?Little MCP (0-90)      ?Little PIP (0-100)      ?Little DIP (0-70)      ?(Blank rows = not tested) ? ? ?UE MMT:    ? ?MMT Right ?09/22/2021 Left ?09/22/2021  ?Shoulder flexion    ?Shoulder abduction    ?Shoulder adduction    ?Shoulder extension    ?Shoulder internal rotation    ?Shoulder external rotation    ?Middle trapezius    ?Lower trapezius    ?Elbow flexion    ?Elbow extension    ?Wrist flexion    ?Wrist extension    ?Wrist ulnar  deviation    ?Wrist radial deviation    ?Wrist pronation    ?Wrist supination    ?(Blank rows = not tested) ? ?HAND FUNCTION: ?{handfunction:27230} ? ?COORDINATION: ?{otcoordination:27237} ? ?SENSATION: ?{sensation:27233} ? ?EDEMA: *** ? ?COGNITION: ?Overall cognitive status: {cognition:24006} ?Areas of impairment: {impairedcognition:27234} ? ?OBSERVATIONS: *** ? ? ?TODAY'S TREATMENT:  ?*** ? ? ?PATIENT EDUCATION: ?Education details: *** ?Person educated: {Person educated:25204} ?Education method: {Education Method:25205} ?Education comprehension: {Education Comprehension:25206} ? ? ?HOME EXERCISE PROGRAM: ?*** ? ?GOALS: ?Goals reviewed with patient? {yes/no:20286} ? ?SHORT TERM GOALS: (STG required if POC>30 days) ? ?Pt will obtain protective, custom orthotic. ?Target date: *** ?Goal status: MET ? ?2.  Pt will demo/state understanding of initial HEP to improve pain levels and prerequisite motion. ?Target date: *** ?Goal status: INITIAL ? ? ?LONG TERM GOALS: ? ?Pt will improve functional ability by decreased impairment per Quick DASH assessment from ***% to ***% or better, for better quality of life. ?Target date: *** ?Goal status: INITIAL ? ?2.  Pt will improve grip strength in *** hand from  ***lbs to at least ***lbs for functional use at home and in IADLs. ?Target date: *** ?Goal status: INITIAL ? ?3.  Pt will improve A/ROM in *** from *** to at least ***, to have functional motion for tasks lik

## 2021-09-27 NOTE — Telephone Encounter (Signed)
Notes have been faxed

## 2021-10-18 DEATH — deceased
